# Patient Record
Sex: Female | Born: 1950 | Race: Black or African American | Hispanic: No | Marital: Married | Smoking: Never smoker
Health system: Southern US, Community
[De-identification: ages and names within clinical notes are randomized; demographics above are authoritative.]

## PROBLEM LIST (undated history)

## (undated) DIAGNOSIS — I252 Old myocardial infarction: Secondary | ICD-10-CM

## (undated) DIAGNOSIS — I10 Essential (primary) hypertension: Secondary | ICD-10-CM

## (undated) DIAGNOSIS — R0602 Shortness of breath: Secondary | ICD-10-CM

## (undated) DIAGNOSIS — F32A Depression, unspecified: Secondary | ICD-10-CM

## (undated) DIAGNOSIS — J9601 Acute respiratory failure with hypoxia: Secondary | ICD-10-CM

## (undated) DIAGNOSIS — F419 Anxiety disorder, unspecified: Secondary | ICD-10-CM

## (undated) DIAGNOSIS — E559 Vitamin D deficiency, unspecified: Secondary | ICD-10-CM

## (undated) DIAGNOSIS — I5042 Chronic combined systolic (congestive) and diastolic (congestive) heart failure: Secondary | ICD-10-CM

## (undated) DIAGNOSIS — M25562 Pain in left knee: Secondary | ICD-10-CM

## (undated) DIAGNOSIS — K5900 Constipation, unspecified: Secondary | ICD-10-CM

## (undated) DIAGNOSIS — R600 Localized edema: Secondary | ICD-10-CM

## (undated) DIAGNOSIS — Z90710 Acquired absence of both cervix and uterus: Secondary | ICD-10-CM

## (undated) DIAGNOSIS — F329 Major depressive disorder, single episode, unspecified: Secondary | ICD-10-CM

## (undated) DIAGNOSIS — M549 Dorsalgia, unspecified: Secondary | ICD-10-CM

## (undated) DIAGNOSIS — E785 Hyperlipidemia, unspecified: Secondary | ICD-10-CM

## (undated) DIAGNOSIS — M25561 Pain in right knee: Secondary | ICD-10-CM

## (undated) DIAGNOSIS — M2550 Pain in unspecified joint: Secondary | ICD-10-CM

## (undated) DIAGNOSIS — I428 Other cardiomyopathies: Secondary | ICD-10-CM

## (undated) DIAGNOSIS — I219 Acute myocardial infarction, unspecified: Secondary | ICD-10-CM

## (undated) DIAGNOSIS — M1990 Unspecified osteoarthritis, unspecified site: Secondary | ICD-10-CM

## (undated) DIAGNOSIS — G4730 Sleep apnea, unspecified: Secondary | ICD-10-CM

## (undated) DIAGNOSIS — N189 Chronic kidney disease, unspecified: Secondary | ICD-10-CM

## (undated) DIAGNOSIS — R079 Chest pain, unspecified: Secondary | ICD-10-CM

## (undated) DIAGNOSIS — L03119 Cellulitis of unspecified part of limb: Secondary | ICD-10-CM

## (undated) HISTORY — DX: Unspecified osteoarthritis, unspecified site: M19.90

## (undated) HISTORY — DX: Hyperlipidemia, unspecified: E78.5

## (undated) HISTORY — DX: Essential (primary) hypertension: I10

## (undated) HISTORY — DX: Pain in unspecified joint: M25.50

## (undated) HISTORY — DX: Shortness of breath: R06.02

## (undated) HISTORY — DX: Sleep apnea, unspecified: G47.30

## (undated) HISTORY — DX: Vitamin D deficiency, unspecified: E55.9

## (undated) HISTORY — DX: Chronic kidney disease, unspecified: N18.9

## (undated) HISTORY — DX: Other cardiomyopathies: I42.8

## (undated) HISTORY — DX: Localized edema: R60.0

## (undated) HISTORY — DX: Chronic combined systolic (congestive) and diastolic (congestive) heart failure: I50.42

## (undated) HISTORY — DX: Cellulitis of unspecified part of limb: L03.119

## (undated) HISTORY — DX: Major depressive disorder, single episode, unspecified: F32.9

## (undated) HISTORY — DX: Pain in right knee: M25.562

## (undated) HISTORY — DX: Constipation, unspecified: K59.00

## (undated) HISTORY — DX: Morbid (severe) obesity due to excess calories: E66.01

## (undated) HISTORY — DX: Anxiety disorder, unspecified: F41.9

## (undated) HISTORY — DX: Acute respiratory failure with hypoxia: J96.01

## (undated) HISTORY — DX: Acquired absence of both cervix and uterus: Z90.710

## (undated) HISTORY — DX: Dorsalgia, unspecified: M54.9

## (undated) HISTORY — DX: Depression, unspecified: F32.A

## (undated) HISTORY — DX: Pain in right knee: M25.561

## (undated) HISTORY — DX: Old myocardial infarction: I25.2

---
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
## 2002-07-27 ENCOUNTER — Encounter: Payer: Self-pay | Admitting: Cardiology

## 2002-08-20 HISTORY — PX: OTHER SURGICAL HISTORY: SHX169

## 2009-02-18 ENCOUNTER — Encounter: Payer: Self-pay | Admitting: Cardiology

## 2009-02-18 ENCOUNTER — Ambulatory Visit: Payer: Self-pay | Admitting: Internal Medicine

## 2009-03-22 ENCOUNTER — Telehealth (INDEPENDENT_AMBULATORY_CARE_PROVIDER_SITE_OTHER): Payer: Self-pay | Admitting: *Deleted

## 2009-03-22 ENCOUNTER — Ambulatory Visit: Payer: Self-pay | Admitting: Cardiology

## 2009-03-22 DIAGNOSIS — I5022 Chronic systolic (congestive) heart failure: Secondary | ICD-10-CM

## 2009-03-22 DIAGNOSIS — I10 Essential (primary) hypertension: Secondary | ICD-10-CM

## 2009-03-22 DIAGNOSIS — I509 Heart failure, unspecified: Secondary | ICD-10-CM | POA: Insufficient documentation

## 2009-03-22 DIAGNOSIS — Z6841 Body Mass Index (BMI) 40.0 and over, adult: Secondary | ICD-10-CM | POA: Insufficient documentation

## 2009-03-22 HISTORY — DX: Essential (primary) hypertension: I10

## 2009-03-25 LAB — CONVERTED CEMR LAB
ALT: 26 units/L (ref 0–35)
AST: 25 units/L (ref 0–37)
Albumin ELP: 59.5 % (ref 55.8–66.1)
Albumin: 4.2 g/dL (ref 3.5–5.2)
Alkaline Phosphatase: 87 units/L (ref 39–117)
Alpha-1-Globulin: 4 % (ref 2.9–4.9)
Alpha-2-Globulin: 11.1 % (ref 7.1–11.8)
Anti Nuclear Antibody(ANA): NEGATIVE
BUN: 13 mg/dL (ref 6–23)
Beta Globulin: 6.3 % (ref 4.7–7.2)
Bilirubin, Direct: 0.1 mg/dL (ref 0.0–0.3)
CO2: 31 meq/L (ref 19–32)
Calcium: 8.9 mg/dL (ref 8.4–10.5)
Chloride: 106 meq/L (ref 96–112)
Cholesterol: 218 mg/dL — ABNORMAL HIGH (ref 0–200)
Creatinine, Ser: 0.8 mg/dL (ref 0.4–1.2)
Direct LDL: 159.6 mg/dL
GFR calc non Af Amer: 94.81 mL/min (ref >60)
Gamma Globulin: 13.4 % (ref 11.1–18.8)
Glucose, Bld: 95 mg/dL (ref 70–99)
HDL: 54 mg/dL (ref >39.00)
IgA: 185 mg/dL (ref 68–378)
IgG (Immunoglobin G), Serum: 1170 mg/dL (ref 694–1618)
IgM, Serum: 45 mg/dL — ABNORMAL LOW (ref 60–263)
Potassium: 4 meq/L (ref 3.5–5.1)
Pro B Natriuretic peptide (BNP): 23 pg/mL (ref 0.0–100.0)
Sodium: 144 meq/L (ref 135–145)
Total Bilirubin: 0.7 mg/dL (ref 0.3–1.2)
Total CHOL/HDL Ratio: 4
Total Protein, Serum Electrophoresis: 7.7 g/dL (ref 6.0–8.3)
Total Protein: 7.5 g/dL (ref 6.0–8.3)
Triglycerides: 109 mg/dL (ref 0.0–149.0)
VLDL: 21.8 mg/dL (ref 0.0–40.0)

## 2009-03-30 ENCOUNTER — Telehealth: Payer: Self-pay | Admitting: Cardiology

## 2009-04-04 ENCOUNTER — Ambulatory Visit: Payer: Self-pay | Admitting: Cardiology

## 2009-04-04 ENCOUNTER — Encounter: Payer: Self-pay | Admitting: Cardiology

## 2009-04-04 ENCOUNTER — Ambulatory Visit: Payer: Self-pay

## 2009-04-07 ENCOUNTER — Ambulatory Visit: Payer: Self-pay | Admitting: Pulmonary Disease

## 2009-04-07 DIAGNOSIS — G4733 Obstructive sleep apnea (adult) (pediatric): Secondary | ICD-10-CM | POA: Insufficient documentation

## 2009-04-11 LAB — CONVERTED CEMR LAB
BUN: 22 mg/dL (ref 6–23)
CO2: 34 meq/L — ABNORMAL HIGH (ref 19–32)
Calcium: 9.1 mg/dL (ref 8.4–10.5)
Chloride: 107 meq/L (ref 96–112)
Creatinine, Ser: 0.9 mg/dL (ref 0.4–1.2)
GFR calc non Af Amer: 82.75 mL/min (ref >60)
Glucose, Bld: 127 mg/dL — ABNORMAL HIGH (ref 70–99)
Potassium: 4.1 meq/L (ref 3.5–5.1)
Sodium: 146 meq/L — ABNORMAL HIGH (ref 135–145)

## 2009-04-14 ENCOUNTER — Ambulatory Visit: Payer: Self-pay | Admitting: Internal Medicine

## 2009-04-14 ENCOUNTER — Encounter: Payer: Self-pay | Admitting: Family Medicine

## 2009-04-14 LAB — CONVERTED CEMR LAB
ALT: 25 units/L (ref 0–35)
AST: 22 units/L (ref 0–37)
Albumin: 4.5 g/dL (ref 3.5–5.2)
Alkaline Phosphatase: 81 units/L (ref 39–117)
BUN: 17 mg/dL (ref 6–23)
Basophils Absolute: 0 10*3/uL (ref 0.0–0.1)
Basophils Relative: 0 % (ref 0–1)
CO2: 27 meq/L (ref 19–32)
Calcium: 9 mg/dL (ref 8.4–10.5)
Chloride: 103 meq/L (ref 96–112)
Creatinine, Ser: 0.89 mg/dL (ref 0.40–1.20)
Eosinophils Absolute: 0.1 10*3/uL (ref 0.0–0.7)
Eosinophils Relative: 3 % (ref 0–5)
Glucose, Bld: 110 mg/dL — ABNORMAL HIGH (ref 70–99)
HCT: 40.7 % (ref 36.0–46.0)
Hemoglobin: 12.7 g/dL (ref 12.0–15.0)
Lymphocytes Relative: 31 % (ref 12–46)
Lymphs Abs: 1.2 10*3/uL (ref 0.7–4.0)
MCHC: 31.2 g/dL (ref 30.0–36.0)
MCV: 96.2 fL (ref 78.0–100.0)
Monocytes Absolute: 0.4 10*3/uL (ref 0.1–1.0)
Monocytes Relative: 11 % (ref 3–12)
Neutro Abs: 2.2 10*3/uL (ref 1.7–7.7)
Neutrophils Relative %: 55 % (ref 43–77)
Platelets: 224 10*3/uL (ref 150–400)
Potassium: 4.3 meq/L (ref 3.5–5.3)
Pro B Natriuretic peptide (BNP): 32.4 pg/mL (ref 0.0–100.0)
RBC: 4.23 M/uL (ref 3.87–5.11)
RDW: 12.8 % (ref 11.5–15.5)
Sodium: 142 meq/L (ref 135–145)
TSH: 2.107 microintl units/mL (ref 0.350–4.500)
Total Bilirubin: 0.3 mg/dL (ref 0.3–1.2)
Total Protein: 7.1 g/dL (ref 6.0–8.3)
WBC: 3.9 10*3/uL — ABNORMAL LOW (ref 4.0–10.5)

## 2009-04-21 ENCOUNTER — Ambulatory Visit: Payer: Self-pay | Admitting: Cardiology

## 2009-04-21 DIAGNOSIS — I5032 Chronic diastolic (congestive) heart failure: Secondary | ICD-10-CM

## 2009-05-01 ENCOUNTER — Ambulatory Visit (HOSPITAL_BASED_OUTPATIENT_CLINIC_OR_DEPARTMENT_OTHER): Admission: RE | Admit: 2009-05-01 | Discharge: 2009-05-01 | Payer: Self-pay | Admitting: Pulmonary Disease

## 2009-05-01 ENCOUNTER — Encounter: Payer: Self-pay | Admitting: Pulmonary Disease

## 2009-05-02 ENCOUNTER — Ambulatory Visit: Payer: Self-pay | Admitting: Internal Medicine

## 2009-05-11 ENCOUNTER — Telehealth (INDEPENDENT_AMBULATORY_CARE_PROVIDER_SITE_OTHER): Payer: Self-pay | Admitting: *Deleted

## 2009-05-11 ENCOUNTER — Ambulatory Visit: Payer: Self-pay | Admitting: Pulmonary Disease

## 2009-05-16 ENCOUNTER — Ambulatory Visit: Payer: Self-pay | Admitting: Pulmonary Disease

## 2009-05-31 ENCOUNTER — Ambulatory Visit: Payer: Self-pay | Admitting: Cardiology

## 2009-05-31 DIAGNOSIS — E785 Hyperlipidemia, unspecified: Secondary | ICD-10-CM | POA: Insufficient documentation

## 2009-06-14 ENCOUNTER — Ambulatory Visit: Payer: Self-pay | Admitting: Cardiology

## 2009-06-14 DIAGNOSIS — E7800 Pure hypercholesterolemia, unspecified: Secondary | ICD-10-CM

## 2009-06-14 DIAGNOSIS — I5031 Acute diastolic (congestive) heart failure: Secondary | ICD-10-CM | POA: Insufficient documentation

## 2009-06-15 LAB — CONVERTED CEMR LAB
ALT: 25 units/L (ref 0–35)
AST: 21 units/L (ref 0–37)
Albumin: 4.1 g/dL (ref 3.5–5.2)
Alkaline Phosphatase: 74 units/L (ref 39–117)
BUN: 33 mg/dL — ABNORMAL HIGH (ref 6–23)
Bilirubin, Direct: 0 mg/dL (ref 0.0–0.3)
CO2: 29 meq/L (ref 19–32)
Calcium: 9.1 mg/dL (ref 8.4–10.5)
Chloride: 104 meq/L (ref 96–112)
Cholesterol: 226 mg/dL — ABNORMAL HIGH (ref 0–200)
Creatinine, Ser: 1.3 mg/dL — ABNORMAL HIGH (ref 0.4–1.2)
Direct LDL: 160.6 mg/dL
GFR calc non Af Amer: 54.1 mL/min (ref >60)
Glucose, Bld: 99 mg/dL (ref 70–99)
HDL: 44.6 mg/dL (ref >39.00)
Potassium: 4 meq/L (ref 3.5–5.1)
Sodium: 141 meq/L (ref 135–145)
Total Bilirubin: 0.7 mg/dL (ref 0.3–1.2)
Total CHOL/HDL Ratio: 5
Total Protein: 7.4 g/dL (ref 6.0–8.3)
Triglycerides: 256 mg/dL — ABNORMAL HIGH (ref 0.0–149.0)
VLDL: 51.2 mg/dL — ABNORMAL HIGH (ref 0.0–40.0)

## 2009-06-27 ENCOUNTER — Ambulatory Visit: Payer: Self-pay | Admitting: Pulmonary Disease

## 2009-07-04 ENCOUNTER — Ambulatory Visit: Payer: Self-pay | Admitting: Internal Medicine

## 2009-07-25 ENCOUNTER — Ambulatory Visit: Payer: Self-pay | Admitting: Family Medicine

## 2009-07-25 LAB — CONVERTED CEMR LAB
ALT: 23 units/L (ref 0–35)
AST: 20 units/L (ref 0–37)
Albumin: 4.8 g/dL (ref 3.5–5.2)
Alkaline Phosphatase: 77 units/L (ref 39–117)
BUN: 19 mg/dL (ref 6–23)
CO2: 28 meq/L (ref 19–32)
Calcium: 9.3 mg/dL (ref 8.4–10.5)
Chloride: 102 meq/L (ref 96–112)
Creatinine, Ser: 1.05 mg/dL (ref 0.40–1.20)
Glucose, Bld: 94 mg/dL (ref 70–99)
Potassium: 4 meq/L (ref 3.5–5.3)
Pro B Natriuretic peptide (BNP): 3.3 pg/mL (ref 0.0–100.0)
Sodium: 144 meq/L (ref 135–145)
Total Bilirubin: 0.3 mg/dL (ref 0.3–1.2)
Total Protein: 7.8 g/dL (ref 6.0–8.3)

## 2009-09-20 ENCOUNTER — Ambulatory Visit: Payer: Self-pay | Admitting: Cardiology

## 2009-09-21 LAB — CONVERTED CEMR LAB
ALT: 27 units/L (ref 0–35)
AST: 28 units/L (ref 0–37)
Albumin: 4.4 g/dL (ref 3.5–5.2)
Alkaline Phosphatase: 75 units/L (ref 39–117)
BUN: 22 mg/dL (ref 6–23)
Bilirubin, Direct: 0.1 mg/dL (ref 0.0–0.3)
CO2: 31 meq/L (ref 19–32)
Calcium: 9 mg/dL (ref 8.4–10.5)
Chloride: 103 meq/L (ref 96–112)
Cholesterol: 170 mg/dL (ref 0–200)
Creatinine, Ser: 1.3 mg/dL — ABNORMAL HIGH (ref 0.4–1.2)
GFR calc non Af Amer: 54.05 mL/min (ref >60)
Glucose, Bld: 111 mg/dL — ABNORMAL HIGH (ref 70–99)
HDL: 53.8 mg/dL (ref >39.00)
LDL Cholesterol: 93 mg/dL (ref 0–99)
Magnesium: 2 mg/dL (ref 1.5–2.5)
Potassium: 3.6 meq/L (ref 3.5–5.1)
Sodium: 143 meq/L (ref 135–145)
Total Bilirubin: 0.4 mg/dL (ref 0.3–1.2)
Total CHOL/HDL Ratio: 3
Total Protein: 7.5 g/dL (ref 6.0–8.3)
Triglycerides: 116 mg/dL (ref 0.0–149.0)
VLDL: 23.2 mg/dL (ref 0.0–40.0)

## 2009-10-27 ENCOUNTER — Ambulatory Visit: Payer: Self-pay | Admitting: Cardiology

## 2009-11-03 ENCOUNTER — Encounter: Payer: Self-pay | Admitting: Cardiology

## 2009-11-03 ENCOUNTER — Encounter: Admission: RE | Admit: 2009-11-03 | Discharge: 2009-11-03 | Payer: Self-pay | Admitting: Cardiology

## 2009-11-21 ENCOUNTER — Ambulatory Visit: Payer: Self-pay | Admitting: Internal Medicine

## 2009-11-21 LAB — CONVERTED CEMR LAB
BUN: 26 mg/dL — ABNORMAL HIGH (ref 6–23)
CO2: 25 meq/L (ref 19–32)
CRP: 0.3 mg/dL (ref <0.6)
Calcium: 9.3 mg/dL (ref 8.4–10.5)
Chloride: 106 meq/L (ref 96–112)
Creatinine, Ser: 1.01 mg/dL (ref 0.40–1.20)
Glucose, Bld: 92 mg/dL (ref 70–99)
Hgb A1c MFr Bld: 6.7 % — ABNORMAL HIGH (ref 4.6–6.1)
Potassium: 4.5 meq/L (ref 3.5–5.3)
Pro B Natriuretic peptide (BNP): 18.7 pg/mL (ref 0.0–100.0)
Sodium: 145 meq/L (ref 135–145)
TSH: 1.316 microintl units/mL (ref 0.350–4.500)

## 2009-12-19 ENCOUNTER — Ambulatory Visit: Payer: Self-pay | Admitting: Internal Medicine

## 2010-01-06 ENCOUNTER — Encounter (INDEPENDENT_AMBULATORY_CARE_PROVIDER_SITE_OTHER): Payer: Self-pay | Admitting: *Deleted

## 2010-01-18 ENCOUNTER — Ambulatory Visit: Payer: Self-pay | Admitting: Pulmonary Disease

## 2010-03-08 ENCOUNTER — Ambulatory Visit: Payer: Self-pay | Admitting: Internal Medicine

## 2010-03-08 LAB — CONVERTED CEMR LAB
BUN: 16 mg/dL (ref 6–23)
CO2: 29 meq/L (ref 19–32)
Calcium: 9.3 mg/dL (ref 8.4–10.5)
Chloride: 102 meq/L (ref 96–112)
Creatinine, Ser: 1.01 mg/dL (ref 0.40–1.20)
Glucose, Bld: 123 mg/dL — ABNORMAL HIGH (ref 70–99)
Magnesium: 2.1 mg/dL (ref 1.5–2.5)
Potassium: 4.6 meq/L (ref 3.5–5.3)
Sodium: 145 meq/L (ref 135–145)

## 2010-05-25 ENCOUNTER — Encounter (INDEPENDENT_AMBULATORY_CARE_PROVIDER_SITE_OTHER): Payer: Self-pay | Admitting: Emergency Medicine

## 2010-05-25 ENCOUNTER — Ambulatory Visit: Payer: Self-pay | Admitting: Surgery

## 2010-05-26 ENCOUNTER — Inpatient Hospital Stay (HOSPITAL_COMMUNITY): Admission: EM | Admit: 2010-05-26 | Discharge: 2010-05-29 | Payer: Self-pay | Admitting: Emergency Medicine

## 2010-08-23 ENCOUNTER — Encounter: Payer: Self-pay | Admitting: Cardiology

## 2010-08-23 ENCOUNTER — Ambulatory Visit
Admission: RE | Admit: 2010-08-23 | Discharge: 2010-08-23 | Payer: Self-pay | Source: Home / Self Care | Attending: Cardiology | Admitting: Cardiology

## 2010-09-01 ENCOUNTER — Ambulatory Visit: Admit: 2010-09-01 | Payer: Self-pay | Admitting: Cardiology

## 2010-09-19 NOTE — Letter (Signed)
Summary: Appointment - Reminder [place], [place]  [address] [place]   [place], [place] [address]   Phone: [phone]  Fax: [phone]     Jan 06, 2010 MRN: [id number]   Robin Hardin [address] [place], [place]  [address]   Dear Ms. [name],  [facility] records indicate that it is time to schedule a follow-up appointment with Dr. Aundra Dubin. It is very important that we reach you to schedule this appointment. We look forward to participating in your health care needs. Please contact us at the number listed above at your earliest convenience to schedule your appointment.  If you are unable to make an appointment at this time, give [place] a call so we can update our records.     Sincerely,  Darnell Level [facility] Scheduling Team

## 2010-09-19 NOTE — Assessment & Plan Note (Signed)
Summary: per chekc out/sf   Referring Provider:  Loralie Champagne Primary Provider:  Dr. Milinda Antis  CC:  follow up of labs.  Pt reports she is having cramps in her thighs and feet.   She was put on potassium and magnesium.  Robin Hardin  History of Present Illness: 60 yo with history of CHF (nonischemic CMP), morbid obesity, and hypertension presents for followup.  She had been followed by a cardiologist in [place] until she moved with her family to [place].  She was diagnosed with CHF in 2003 and has been hospitalized 3 times.  LHC showed no significant coronary disease but EF 30-35% on V-gram.  Our followup echo showed EF 45-50%.    She is not very active. She is short of breath if she walks fast but can walk for 3-4 minutes at a time if she goes slowly.  She can climb a flight of steps but it makes her short of breath.  She has 3 pillow orthopnea.  These symptoms have been chronic.  She is very limited by bilateral knee pain and back pain. She was having severe leg cramps in the setting of torsemide use at last appointment.  She is now on potassium and magnesium replacement with improvement in her cramping.  She is not taking her torsemide regularly and weight is up 9 lbs since last appointment.  She is using CPAP.    Labs (8/10): K 4.1, creatinine 0.9, SPEP neg, ANA neg, BNP 32.4, TSH normal, HIV negative, LDL 160, HDL 54 Labs (10/10): TGs 256, HDL 45, LDL 160 Labs (12/10): creatinine 1.05, BNP 3 Labs (2/11): LDL 93, HDL 54, Mg 2, K 3.6, creatinine 1.3, LFTs normal  Current Medications (verified): 1)  Aldactone 25 Mg Tabs (Spironolactone) .... Once Daily 2)  Allopurinol 100 Mg Tabs (Allopurinol) .... Take One Tablet By Mouth Once Daily 3)  Celexa 40 Mg Tabs (Citalopram Hydrobromide) .... Once Daily 4)  Carvedilol 12.5 Mg Tabs (Carvedilol) .... Two Times A Day 5)  Meloxicam 15 Mg Tabs (Meloxicam) .... Once Daily 6)  Torsemide 20 Mg Tabs (Torsemide) .... Two Tablets Daily 7)  Cyclobenzaprine Hcl 10 Mg  Tabs (Cyclobenzaprine Hcl) .... Three Times A Day As Needed 8)  Amitriptyline Hcl 50 Mg Tabs (Amitriptyline Hcl) .... Once Daily 9)  Aspirin 325 Mg Tabs (Aspirin) .... Once Daily 10)  Pravastatin Sodium 40 Mg Tabs (Pravastatin Sodium) .... 2 Tabs in The Evening 11)  Diovan 80 Mg Tabs (Valsartan) .... 2 Tabs Am-  1 Tab Pm 12)  Potassium Chloride Crys Cr 20 Meq Cr-Tabs (Potassium Chloride Crys Cr) .... Take One Tablet By Mouth Twice A Day 13)  Magnesium Oxide 400 Mg Tabs (Magnesium Oxide) .... One Tablet Daily  Allergies (verified): No Known Drug Allergies  Past History:  Past Medical History: Reviewed history from 09/20/2009 and no changes required. 1.  Nonischemic CMP:  Patient was diagnosed with CHF in 2003.  She was hospitalized at [facility] in [place] where she had a cath showing normal coronaries but EF 30-35% on LV-gram.  She has been hospitalized two other times since then in [place] for CHF.  She has recently moved to [facility].  Echo (8/10) showed EF 45-50%, mild global HK, mild LVH, grade I diastolic dysfunction, no significant valvular problems.   2.  HTN 3.  Depression 4.  Morbid obesity 5.  Hyperlipidemia 6.  OSA: using CPAP 7.  Gout on allopurinol 8.  Bilateral knee pain, probable osteoarthritis  Family History: Reviewed history  from 04/21/2009 and no changes required. No FH of premature CAD asthma: mother, 2 brothers heart disease: mother clotting disorders: mother (stroke)   Social History: Reviewed history from 04/07/2009 and no changes required. Recently moved from [place] to [place].   Married with 2 adopted children.    Never smoked, no ETOH or drugs.   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   Review of Systems       All systems reviewed and negative except as per HPI.   Vital Signs:  Patient profile:   60 year old female Height:      63 inches Weight:      392 pounds BMI:     69.69 Pulse rate:   85 / minute Pulse rhythm:    regular BP sitting:   112 / 79  (left arm) Cuff size:   large  Vitals Entered By: Doug Sou CMA (October 27, 2009 11:27 AM)  Physical Exam  General:  morbidly obese female in nad Neck:  Neck supple, no JVD. No masses, thyromegaly or abnormal cervical nodes. Lungs:  Clear bilaterally to auscultation and percussion. Heart:  Non-displaced PMI, chest non-tender; regular rate and rhythm, S1, S2 without rubs or gallops, 1/6 SEM RUSB. Carotid upstroke normal, no bruit. Pedals normal pulses. 1+ ankle edema.  Abdomen:  Bowel sounds positive; abdomen soft and non-tender without masses, organomegaly, or hernias noted. No hepatosplenomegaly.  Obese.  Extremities:  No clubbing or cyanosis. Neurologic:  Alert and oriented x 3. Psych:  Normal affect.   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  Symptoms are stable.   She is probably mildly volume overloaded.  She is very limited by obesity and arthritis in addition to her CHF/volume overload.  Cramping has improved with use of KCl and magnesium but she is still not very regular about taking torsemide and weight is up.  I emphasized to her the need to take torsemide daily.  She is trying to avoid sodium.  Continue Coreg, valsartan, and spironolactone.    Problem # 2:  BXUXYBFXOVANVB-TYOMA (YOK-599.4) Lipids improved on higher dose of pravastatin.   Problem # 3:  OBESITY-MORBID (>100') (ICD-278.01) I discussed the need for dieting and exercise with the patient.  She has an appointment to see a nutritionist at [facility].    Patient Instructions: 1)  Your physician recommends that you schedule a follow-up appointment in: 4 months with Dr. Aundra Dubin.

## 2010-09-19 NOTE — Assessment & Plan Note (Signed)
Summary: 3 MONTH ROV/SL   Visit Type:  Follow-up Referring Provider:  Loralie Champagne Primary Provider:  Dr. Milinda Antis  CC:  Pt was not able to take torsemide 3 pills daily due to leg cramps . Pt states some days she took 1 or 2 then do to cramping she may not have taking any..  History of Present Illness: 60 yo with history of CHF (nonischemic CMP), morbid obesity, and hypertension presents for followup.  She had been followed by a cardiologist in [place] until she moved with her family to [place].  She was diagnosed with CHF in 2003 and has been hospitalized 3 times.  LHC showed no significant coronary disease but EF 30-35% on V-gram.  Our followup echo showed EF 45-50%.    She is not very active. She is short of breath if she walks fast but can walk for 3-4 minutes at a time if she goes slowly.  She can climb a flight of steps but it makes her short of breath.  She has 3 pillow orthopnea.  These symptoms have been chronic.  She is very limited by bilateral knee pain and back pain. I put her on torsemide 60 mg daily.  She tells me that she has been unable to take the full dose because of severe cramping in her legs bilaterally.  She has been taking 20 mg or 40 mg daily but did not take any for the last 2 days.  Potassium level was normal when checked in 12/10.  When she does not take torsemide, she does not get leg cramps.  She has been using her CPAP.   Weight is down 3 lbs since last appointment.   Labs (8/10): K 4.1, creatinine 0.9, SPEP neg, ANA neg, BNP 32.4, TSH normal, HIV negative, LDL 160, HDL 54 Labs (10/10): TGs 256, HDL 45, LDL 160 Labs (12/10): creatinine 1.05, BNP 3  Current Medications (verified): 1)  Aldactone 25 Mg Tabs (Spironolactone) .... Once Daily 2)  Allopurinol 100 Mg Tabs (Allopurinol) .... Take One Tablet By Mouth Once Daily 3)  Celexa 40 Mg Tabs (Citalopram Hydrobromide) .... Once Daily 4)  Carvedilol 12.5 Mg Tabs (Carvedilol) .... Two Times A Day 5)  Meloxicam 15  Mg Tabs (Meloxicam) .... Once Daily 6)  Torsemide 20 Mg Tabs (Torsemide) .... Two Tablets Daily 7)  Cyclobenzaprine Hcl 10 Mg Tabs (Cyclobenzaprine Hcl) .... Three Times A Day As Needed 8)  Amitriptyline Hcl 50 Mg Tabs (Amitriptyline Hcl) .... Once Daily 9)  Aspirin 325 Mg Tabs (Aspirin) .... Once Daily 10)  Cvs Vitamin E 1000 Unit Caps (Vitamin E) .... Once Daily(Hold) 11)  Fe-Caps 250 Mg Cr-Caps (Ferrous Sulfate) .... Once Daily(Hold) 12)  Fish Oil 1200 Mg Caps (Omega-3 Fatty Acids) .... Once Daily (Hold) 13)  Pravastatin Sodium 40 Mg Tabs (Pravastatin Sodium) .... 2 Tabs in The Evening 14)  Diovan 80 Mg Tabs (Valsartan) .... 2 Tabs Am-  1 Tab Pm 15)  Potassium Chloride Crys Cr 20 Meq Cr-Tabs (Potassium Chloride Crys Cr) .... Take One Tablet By Mouth Twice A Day 16)  Magnesium Oxide 400 Mg Tabs (Magnesium Oxide) .... One Tablet Daily  Allergies (verified): No Known Drug Allergies  Past History:  Past Medical History: 1.  Nonischemic CMP:  Patient was diagnosed with CHF in 2003.  She was hospitalized at [facility] in [place] where she had a cath showing normal coronaries but EF 30-35% on LV-gram.  She has been hospitalized two other times since then in [place]  for CHF.  She has recently moved to [facility]  Echo (8/10) showed EF 45-50%, mild global HK, mild LVH, grade I diastolic dysfunction, no significant valvular problems.   2.  HTN 3.  Depression 4.  Morbid obesity 5.  Hyperlipidemia 6.  OSA: using CPAP 7.  Gout on allopurinol 8.  Bilateral knee pain, probable osteoarthritis  Family History: Reviewed history from 04/21/2009 and no changes required. No FH of premature CAD asthma: mother, 2 brothers heart disease: mother clotting disorders: mother (stroke)   Social History: Reviewed history from 04/07/2009 and no changes required. Recently moved from [place] to [place].   Married with 2 adopted children.    Never smoked, no ETOH or drugs.   retired/on disability.  previously worked as a Copywriter, advertising.   Review of Systems       All systems reviewed and negative except as per HPI.   Vital Signs:  Patient profile:   60 year old female Height:      63 inches Weight:      383 pounds Pulse rate:   67 / minute BP sitting:   122 / 76  (left arm) Cuff size:   large  Vitals Entered By: Lubertha Basque, CNA (September 20, 2009 10:11 AM)  Physical Exam  General:  morbidly obese female in nad Neck:  Neck supple, JVP difficult to discern given body habitus, probably 8-9 cm. No masses, thyromegaly or abnormal cervical nodes. Lungs:  Clear bilaterally to auscultation and percussion. Heart:  Non-displaced PMI, chest non-tender; regular rate and rhythm, S1, S2 without rubs or gallops, 1/6 SEM RUSB. Carotid upstroke normal, no bruit. Pedals normal pulses. 1+ edema 1/2 up lower legs bilaterally. Abdomen:  Bowel sounds positive; abdomen soft and non-tender without masses, organomegaly, or hernias noted. No hepatosplenomegaly.  Obese.  Extremities:  No clubbing or cyanosis. Neurologic:  Alert and oriented x 3. Psych:  Normal affect.   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  She appears a bit volume overloaded today.  Symptoms are stable.  She is very limited by obesity and arthritis in addition to her CHF/volume overload.  She has not been taking torsemide because of cramping.  The cramping resolves off torsemide so I suspect that electrolyte shifts rather than her statin are causing the symptoms.  I will have her restart torsemide at 40 mg daily.  She will also take KCl 40 mEq daily and magnesium oxide 400 mg daily.  I will check Mg and K today.  Of note, her BNP runs low but is likely a false positive due to obesity. Repeat BMET and Mg in 2 wks.  Continue same doses of Coreg and valsartan.  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   Problem # 3:  OBSTRUCTIVE  SLEEP APNEA (ICD-327.23) Continue to use CPAP.   Problem # 4:  OBESITY-MORBID (>100') (ICD-278.01) Really needs to work on weight loss. Given joint pains, hard for her to exercise.  Will refer her to nutrition.   Other Orders: TLB-BMP (Basic Metabolic Panel-BMET) (95188-[id number]) TLB-Lipid Panel (80061-LIPID) TLB-Hepatic/Liver Function Pnl (80076-HEPATIC) TLB-Magnesium (Mg) (83735-MG) Nutrition Referral (Nutrition)  Patient Instructions: 1)  Your physician has recommended you make the following change in your medication:  2)  Take Torsemide 69m two tablets daily 3)  Start KCL(potassium) 263m-one tablet twice a day 4)  Start Magnesium Oxide 40081mne tablet daily 5)  Your physician recommends that you have a  lipid profile/liver profile/BMP/Magnesium today 428.22 6)  Your physician recommends that you return for lab work in: 2 weeks BMP/Magnesium 428.22 7)  Your physician recommends that you schedule a follow-up appointment in: 1 month with Dr. Loralie Champagne  8)  An appointment for you to see the Dietician has been scheduled. Please keep your appointment or be sure to call if you need to reschedule. Prescriptions: MAGNESIUM OXIDE 400 MG TABS (MAGNESIUM OXIDE) one tablet daily  #30 x 3   Entered by:   Desiree Lucy, RN, BSN   Authorized by:   Loralie Champagne, MD   Signed by:   Desiree Lucy, RN, BSN on 09/20/2009   Method used:   Electronically to        [facility] [phone]* (retail)       [place], [place]  [address]       Ph: [phone]       Fax: [phone]   RxID:   [id number] POTASSIUM CHLORIDE CRYS CR 20 MEQ CR-TABS (POTASSIUM CHLORIDE CRYS CR) Take one tablet by mouth twice a day  #60 x 3   Entered by:   Desiree Lucy, RN, BSN   Authorized by:   Loralie Champagne, MD   Signed by:   Desiree Lucy, RN, BSN on 09/20/2009   Method used:   Electronically to        [facility] [phone]* (retail)       [place], [place]   [address]       Ph: [phone]       Fax: [phone]   RxID:   [id number] TORSEMIDE 20 MG TABS (TORSEMIDE) two tablets daily  #60 x 3   Entered by:   Desiree Lucy, RN, BSN   Authorized by:   Loralie Champagne, MD   Signed by:   Desiree Lucy, RN, BSN on 09/20/2009   Method used:   Electronically to        [facility] [phone]* (retail)       [address]       [place], [place]  [address]       Ph: [phone]       Fax: [phone]   RxID:   [id number]

## 2010-09-19 NOTE — Letter (Signed)
Summary: [facility] [place]   Imported By: Sallee Provencal 11/18/2009 11:07:51  _____________________________________________________________________  External Attachment:    Type:   Image     Comment:   External Document

## 2010-09-19 NOTE — Assessment & Plan Note (Signed)
Summary: rov for osa   Copy to:  Loralie Champagne Primary Provider/Referring Provider:  Dr. Milinda Antis  CC:  ROV and No new complaints.  History of Present Illness: the pt comes in today for f/u of her osa.  She is wearing cpap compliantly, but has not kept up with mask changes.  She denies any issues with mask fit or pressure, and feels that she is sleeping well.  She is also satisfied with her daytime alertness.  Unfortunately, she has not lost weight, and has actually gained 10 pounds since the last visit.  Current Medications (verified): 1)  Aldactone 25 Mg Tabs (Spironolactone) .... Once Daily 2)  Allopurinol 100 Mg Tabs (Allopurinol) .... Take One Tablet By Mouth Once Daily 3)  Celexa 40 Mg Tabs (Citalopram Hydrobromide) .... Once Daily 4)  Carvedilol 12.5 Mg Tabs (Carvedilol) .... Two Times A Day 5)  Meloxicam 15 Mg Tabs (Meloxicam) .... Once Daily 6)  Torsemide 20 Mg Tabs (Torsemide) .... Two Tablets Daily 7)  Cyclobenzaprine Hcl 10 Mg Tabs (Cyclobenzaprine Hcl) .... Three Times A Day As Needed 8)  Amitriptyline Hcl 50 Mg Tabs (Amitriptyline Hcl) .... Once Daily 9)  Aspirin 325 Mg Tabs (Aspirin) .... Once Daily 10)  Pravastatin Sodium 40 Mg Tabs (Pravastatin Sodium) .... 2 Tabs in The Evening 11)  Diovan 80 Mg Tabs (Valsartan) .... 2 Tabs Am-  1 Tab Pm 12)  Potassium Chloride Crys Cr 20 Meq Cr-Tabs (Potassium Chloride Crys Cr) .... Take One Tablet By Mouth Twice A Day 13)  Magnesium Oxide 400 Mg Tabs (Magnesium Oxide) .... One Tablet Daily 14)  Cinnamon 500 Mg Caps (Cinnamon) .... Take 2 Cap 1 Hour B4 Meal 15)  Metformin Hcl 500 Mg Tabs (Metformin Hcl) .Marland Kitchen.. 1 Tablet Twice Daily 16)  Avocado Tabs .... Take 1 Tablet By Mouth Two Times A Day  Allergies (verified): No Known Drug Allergies  Review of Systems       The patient complains of shortness of breath with activity and joint stiffness or pain.  The patient denies shortness of breath at rest, productive cough, non-productive  cough, coughing up blood, chest pain, irregular heartbeats, acid heartburn, indigestion, loss of appetite, weight change, abdominal pain, difficulty swallowing, sore throat, tooth/dental problems, headaches, nasal congestion/difficulty breathing through nose, sneezing, itching, ear ache, anxiety, depression, hand/feet swelling, rash, change in color of mucus, and fever.    Vital Signs:  Patient profile:   60 year old female Height:      63 inches Weight:      392 pounds BMI:     69.69 O2 Sat:      97 % on Room air Temp:     98.1 degrees F oral Pulse rate:   76 / minute BP sitting:   124 / 74  (right arm)  Vitals Entered By: Lyndee Leo, CMA (January 18, 2010 2:14 PM)  O2 Sat at Rest %:  97% O2 Flow:  Room air  Reason for Visit patient states she is feeling well, her only complaint is dyspnea w/exertion, states she must rest after walking steps   Physical Exam  General:  morbidly obese female in nad Nose:  no skin breakdown or pressure necrosis from cpap mask no purulence, nasal airway patent. Mouth:  clear Extremities:  +edema, no cyanosis Neurologic:  alert and oriented, moves all 4.   Impression & Recommendations:  Problem # 1:  OBSTRUCTIVE SLEEP APNEA (ICD-327.23) the pt has been compliant with cpap, but is due for a  new mask and supplies.  Will send the order to her dme.  She is sleeping well, and satisfied with her alertness during the day.  She is gaining weight, and I have reminded her that she is approaching the limit of her cpap machine wrt pressure level.  If she continues to gain weight, she may require trach with nocturnal ventilator in order to appropriately treat her.  Medications Added to Medication List This Visit: 1)  Cinnamon 500 Mg Caps (Cinnamon) .... Take 2 cap 1 hour b4 meal 2)  Metformin Hcl 500 Mg Tabs (Metformin hcl) .Marland Kitchen.. 1 tablet twice daily 3)  Avocado Tabs  .... Take 1 tablet by mouth two times a day  Other Orders: Est. Patient Level III  (44360)  Patient Instructions: 1)  continue with cpap nightly.   2)  work on weight loss 3)  make sure you get a new mask every 6-69mo. 4)  followup with me in one year.

## 2010-09-21 NOTE — Assessment & Plan Note (Signed)
Summary: sob/mt   Visit Type:  rov Primary Provider:  Dr. Mammie Lorenzo  CC:  shortness of breath, swelling in legs, and and hands.  History of Present Illness: 60 yo with history of CHF (nonischemic CMP), morbid obesity, and hypertension presents for followup.  She had been followed by a cardiologist in [place] until she moved with her family to [place].  She was diagnosed with CHF in 2003 and has been hospitalized 3 times.  LHC showed no significant coronary disease but EF 30-35% on V-gram.  Our followup echo showed EF 45-50%.    She is not very active. She is short of breath if she walks fast but can walk for 3-4 minutes at a time if she goes slowly.  She can climb a flight of steps but it makes her very short of breath. She sleeps on 2 pillows but is not propped up much. These symptoms have been chronic.  She is very limited by bilateral knee pain and back pain. Weight is down 1 lb since last appointment.  A few weeks ago, she was more short of breath (was not taking her torsemide every day).  Since restarting daily torsemide, breathing has returned to baseline.    Labs (8/10): K 4.1, creatinine 0.9, SPEP neg, ANA neg, BNP 32.4, TSH normal, HIV negative, LDL 160, HDL 54 Labs (10/10): TGs 256, HDL 45, LDL 160 Labs (12/10): creatinine 1.05, BNP 3 Labs (2/11): LDL 93, HDL 54, Mg 2, K 3.6, creatinine 1.3, LFTs normal Labs (4/11): K 4.5, creatinine 1.01, TSH normal, BNP 19  Current Medications (verified): 1)  Aldactone 25 Mg Tabs (Spironolactone) .... Once Daily 2)  Allopurinol 100 Mg Tabs (Allopurinol) .... Take One Tablet By Mouth Once Daily 3)  Celexa 40 Mg Tabs (Citalopram Hydrobromide) .... Once Daily 4)  Carvedilol 12.5 Mg Tabs (Carvedilol) .... Two Times A Day 5)  Arthrotec 50-200 Mg-Mcg Tabs (Diclofenac-Misoprostol) .Marland Kitchen.. 1 Tablet Two Times A Day As Needed' 6)  Torsemide 20 Mg Tabs (Torsemide) .... Three  Tablets Daily 7)  Cyclobenzaprine Hcl 10 Mg Tabs (Cyclobenzaprine Hcl) ....  Three Times A Day As Needed 8)  Amitriptyline Hcl 50 Mg Tabs (Amitriptyline Hcl) .... Once Daily 9)  Aspirin 325 Mg Tabs (Aspirin) .... Once Daily 10)  Pravastatin Sodium 40 Mg Tabs (Pravastatin Sodium) .... 2 Tabs in The Evening 11)  Diovan 80 Mg Tabs (Valsartan) .... 2 Tabs Twice A Day 12)  Potassium Chloride Crys Cr 20 Meq Cr-Tabs (Potassium Chloride Crys Cr) .... Take One Tablet By Mouth Twice A Day 13)  Magnesium Oxide 400 Mg Tabs (Magnesium Oxide) .... One Tablet Daily 14)  Cinnamon 500 Mg Caps (Cinnamon) .... Take 2 Cap 1 Hour B4 Meal 15)  Metformin Hcl 500 Mg Tabs (Metformin Hcl) .Marland Kitchen.. 1 Tablet Twice Daily 16)  Avocado Tabs .... Take 1 Tablet By Mouth Two Times A Day 17)  Paxil 20 Mg Tabs (Paroxetine Hcl) .... Once Daily 18)  Tramadol Hcl 50 Mg Tabs (Tramadol Hcl) .... Three Times A Day As Needed 19)  Ferretts 325 (106 Fe) Mg Tabs (Ferrous Fumarate) .... Once Daily 20)  Fish Oil 1000 Mg Caps (Omega-3 Fatty Acids) .Marland Kitchen.. 1 Tablet Two Times A Day 21)  Vitamin E 400 Unit Caps (Vitamin E) .... Once Daily  Allergies (verified): No Known Drug Allergies  Past History:  Past Medical History: 1.  Nonischemic CMP:  Patient was diagnosed with CHF in 2003.  She was hospitalized at [facility] in [place] where  she had a cath showing normal coronaries but EF 30-35% on LV-gram.  She has been hospitalized two other times since then in [place] for CHF.  She has recently moved to [facility].  Echo (8/10) showed EF 45-50%, mild global HK, mild LVH, grade I diastolic dysfunction, no significant valvular problems.   2.  HTN 3.  Depression 4.  Morbid obesity 5.  Hyperlipidemia 6.  OSA: using CPAP 7.  Gout on allopurinol 8.  Bilateral knee pain, probable osteoarthritis 9.  Diabetes type II 10.  Left lower leg cellulitis in 10/11 with normal [place] for DVT.   Family History: Reviewed history from 04/21/2009 and no changes required. No FH of premature CAD asthma: mother, 2 brothers heart disease:  mother clotting disorders: mother (stroke)   Social History: Reviewed history from 04/07/2009 and no changes required. Recently moved from [place] to [place].   Married with 2 adopted children.    Never smoked, no ETOH or drugs.   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   Review of Systems       All systems reviewed and negative except as per HPI.   Vital Signs:  Patient profile:   60 year old female Height:      63 inches Weight:      391 pounds BMI:     69.51 Pulse rate:   81 / minute BP sitting:   124 / 68  (left arm) Cuff size:   regular  Vitals Entered By: Hansel Feinstein CMA (August 23, 2010 3:28 PM)  Physical Exam  General:  morbidly obese female in nad Neck:  Neck supple, no JVD. No masses, thyromegaly or abnormal cervical nodes. Lungs:  Clear bilaterally to auscultation and percussion. Heart:  Non-displaced PMI, chest non-tender; regular rate and rhythm, S1, S2 without rubs or gallops, 1/6 SEM RUSB. Carotid upstroke normal, no bruit. Pedals normal pulses. 1+ edema left lower leg, trace edema right ankle.   Abdomen:  Bowel sounds positive; abdomen soft and non-tender without masses, organomegaly, or hernias noted. No hepatosplenomegaly. Extremities:  No clubbing or cyanosis. Neurologic:  Alert and oriented x 3. Psych:  Normal affect.   Impression & Recommendations:  Problem # 1:  CONGESTIVE HEART FAILURE, UNSPECIFIED (ICD-428.0) Nonischemic cardiomyopathy, last EF 45-50%.  Patient has long-standing CHF.  She does not appear severely volume overloaded today but difficult to tell given body habitus.  She has NYHA class III symptoms but I think that obesity and deconditioning also play a large role in her symptomatology.  Today, I will have her continue spironolactone and Coreg and will increase valsartan to 160 mg two times a day. BMET in 1 week.   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   OK to decrease aspirin  to 81 mg daily.   Patient Instructions: 1)  Your physician has recommended you make the following change in your medication:  2)  Increase Diovan(valsartan) to 15m twice a day--this will be two 865mtablets twice a day. 3)  Your physician recommends that you return for a FASTING lipid profile/liver profile /BMP in 1 week --428.22 4)  Your physician wants you to follow-up in: 6 months with Dr McAundra Dubin You will receive a reminder letter in the mail two months in advance. If you don't receive a letter, please call our office to schedule the follow-up appointment. Prescriptions: DIOVAN 80 MG TABS (VALSARTAN) 2 tabs twice a day  #120 x 6   Entered by:   AnWebb Silversmith  Lankford, RN, BSN   Authorized by:   Loralie Champagne, MD   Signed by:   Desiree Lucy, RN, BSN on 08/23/2010   Method used:   Faxed to ...       [facility] (retail)       [place], [place]  [address]       Ph: [phone]       Fax: [phone]   RxID:   [id number] DIOVAN 80 MG TABS (VALSARTAN) 2 tabs twice a day  #120 x 6   Entered by:   Desiree Lucy, RN, BSN   Authorized by:   Loralie Champagne, MD   Signed by:   Desiree Lucy, RN, BSN on 08/23/2010   Method used:   Print then Give to Patient   RxID:   [id number] DIOVAN 80 MG TABS (VALSARTAN) 2 tabs twice a day  #120 x 6   Entered by:   Desiree Lucy, RN, BSN   Authorized by:   Loralie Champagne, MD   Signed by:   Desiree Lucy, RN, BSN on 08/23/2010   Method used:   Print then Give to Patient   RxID:   [id number]

## 2010-11-02 LAB — CBC
HCT: 33.2 % — ABNORMAL LOW (ref 36.0–46.0)
HCT: 34 % — ABNORMAL LOW (ref 36.0–46.0)
HCT: 35.5 % — ABNORMAL LOW (ref 36.0–46.0)
Hemoglobin: 10.5 g/dL — ABNORMAL LOW (ref 12.0–15.0)
Hemoglobin: 10.6 g/dL — ABNORMAL LOW (ref 12.0–15.0)
MCH: 29.1 pg (ref 26.0–34.0)
MCH: 29.8 pg (ref 26.0–34.0)
MCHC: 31.2 g/dL (ref 30.0–36.0)
MCHC: 31.6 g/dL (ref 30.0–36.0)
MCV: 91.3 fL (ref 78.0–100.0)
MCV: 93.4 fL (ref 78.0–100.0)
MCV: 94.3 fL (ref 78.0–100.0)
Platelets: 224 10*3/uL (ref 150–400)
Platelets: 244 10*3/uL (ref 150–400)
Platelets: 279 10*3/uL (ref 150–400)
RBC: 3.52 MIL/uL — ABNORMAL LOW (ref 3.87–5.11)
RBC: 3.63 MIL/uL — ABNORMAL LOW (ref 3.87–5.11)
RBC: 3.64 MIL/uL — ABNORMAL LOW (ref 3.87–5.11)
RBC: 3.89 MIL/uL (ref 3.87–5.11)
RDW: 13.4 % (ref 11.5–15.5)
RDW: 13.9 % (ref 11.5–15.5)
WBC: 7.3 10*3/uL (ref 4.0–10.5)
WBC: 8.3 10*3/uL (ref 4.0–10.5)
WBC: 8.5 10*3/uL (ref 4.0–10.5)
WBC: 8.8 10*3/uL (ref 4.0–10.5)

## 2010-11-02 LAB — BASIC METABOLIC PANEL
BUN: 12 mg/dL (ref 6–23)
BUN: 12 mg/dL (ref 6–23)
CO2: 27 mEq/L (ref 19–32)
CO2: 27 mEq/L (ref 19–32)
Calcium: 8.4 mg/dL (ref 8.4–10.5)
Calcium: 8.5 mg/dL (ref 8.4–10.5)
Calcium: 8.7 mg/dL (ref 8.4–10.5)
Chloride: 103 mEq/L (ref 96–112)
Chloride: 107 mEq/L (ref 96–112)
Chloride: 109 mEq/L (ref 96–112)
Chloride: 98 mEq/L (ref 96–112)
Creatinine, Ser: 0.96 mg/dL (ref 0.4–1.2)
Creatinine, Ser: 1.08 mg/dL (ref 0.4–1.2)
Creatinine, Ser: 1.44 mg/dL — ABNORMAL HIGH (ref 0.4–1.2)
GFR calc Af Amer: 45 mL/min — ABNORMAL LOW (ref >60)
GFR calc Af Amer: >60 mL/min (ref >60)
GFR calc Af Amer: >60 mL/min (ref >60)
GFR calc Af Amer: >60 mL/min (ref >60)
GFR calc non Af Amer: 37 mL/min — ABNORMAL LOW (ref >60)
GFR calc non Af Amer: 52 mL/min — ABNORMAL LOW (ref >60)
GFR calc non Af Amer: 56 mL/min — ABNORMAL LOW (ref >60)
GFR calc non Af Amer: 60 mL/min — ABNORMAL LOW (ref >60)
Glucose, Bld: 123 mg/dL — ABNORMAL HIGH (ref 70–99)
Glucose, Bld: 126 mg/dL — ABNORMAL HIGH (ref 70–99)
Potassium: 3.7 mEq/L (ref 3.5–5.1)
Potassium: 4.2 mEq/L (ref 3.5–5.1)
Potassium: 4.6 mEq/L (ref 3.5–5.1)
Sodium: 142 mEq/L (ref 135–145)
Sodium: 143 mEq/L (ref 135–145)
Sodium: 143 mEq/L (ref 135–145)

## 2010-11-02 LAB — DIFFERENTIAL
Basophils Absolute: 0 10*3/uL (ref 0.0–0.1)
Basophils Relative: 0 % (ref 0–1)
Eosinophils Absolute: 0.2 10*3/uL (ref 0.0–0.7)
Eosinophils Relative: 3 % (ref 0–5)
Lymphocytes Relative: 15 % (ref 12–46)
Lymphs Abs: 1.3 10*3/uL (ref 0.7–4.0)
Monocytes Absolute: 0.7 10*3/uL (ref 0.1–1.0)
Monocytes Relative: 8 % (ref 3–12)
Neutro Abs: 6.6 10*3/uL (ref 1.7–7.7)
Neutrophils Relative %: 75 % (ref 43–77)

## 2010-11-02 LAB — URINALYSIS, ROUTINE W REFLEX MICROSCOPIC
Bilirubin Urine: NEGATIVE
Glucose, UA: NEGATIVE mg/dL
Hgb urine dipstick: NEGATIVE
Ketones, ur: NEGATIVE mg/dL
Nitrite: NEGATIVE
Protein, ur: NEGATIVE mg/dL
Specific Gravity, Urine: 1.022 (ref 1.005–1.030)
Urobilinogen, UA: 1 mg/dL (ref 0.0–1.0)
pH: 6 (ref 5.0–8.0)

## 2010-11-02 LAB — COMPREHENSIVE METABOLIC PANEL
ALT: 35 U/L (ref 0–35)
AST: 26 U/L (ref 0–37)
Albumin: 3.3 g/dL — ABNORMAL LOW (ref 3.5–5.2)
Alkaline Phosphatase: 103 U/L (ref 39–117)
BUN: 11 mg/dL (ref 6–23)
CO2: 27 mEq/L (ref 19–32)
Calcium: 8.5 mg/dL (ref 8.4–10.5)
Chloride: 107 mEq/L (ref 96–112)
Creatinine, Ser: 0.94 mg/dL (ref 0.4–1.2)
GFR calc Af Amer: >60 mL/min (ref >60)
GFR calc non Af Amer: >60 mL/min (ref >60)
Glucose, Bld: 119 mg/dL — ABNORMAL HIGH (ref 70–99)
Potassium: 4 mEq/L (ref 3.5–5.1)
Sodium: 141 mEq/L (ref 135–145)
Total Bilirubin: 0.4 mg/dL (ref 0.3–1.2)
Total Protein: 7 g/dL (ref 6.0–8.3)

## 2010-11-02 LAB — CULTURE, BLOOD (ROUTINE X 2)
Culture  Setup Time: 201110071433
Culture  Setup Time: 201110071433
Culture: NO GROWTH
Culture: NO GROWTH

## 2010-11-02 LAB — URINE CULTURE
Colony Count: 8000
Culture  Setup Time: 201110061554

## 2010-11-02 LAB — URINE MICROSCOPIC-ADD ON

## 2010-11-02 LAB — D-DIMER, QUANTITATIVE: D-Dimer, Quant: 0.82 ug/mL-FEU — ABNORMAL HIGH (ref 0.00–0.48)

## 2010-11-02 LAB — FOLATE: Folate: 11.2 ng/mL

## 2010-11-02 LAB — RETICULOCYTES
RBC.: 3.65 MIL/uL — ABNORMAL LOW (ref 3.87–5.11)
Retic Count, Absolute: 40.2 10*3/uL (ref 19.0–186.0)

## 2010-11-02 LAB — BRAIN NATRIURETIC PEPTIDE: Pro B Natriuretic peptide (BNP): 176 pg/mL — ABNORMAL HIGH (ref 0.0–100.0)

## 2010-11-02 LAB — HEMOGLOBIN A1C
Hgb A1c MFr Bld: 6.8 % — ABNORMAL HIGH (ref <5.7)
Mean Plasma Glucose: 148 mg/dL — ABNORMAL HIGH (ref <117)

## 2010-11-02 LAB — IRON AND TIBC: TIBC: 281 ug/dL (ref 250–470)

## 2010-11-02 LAB — VITAMIN B12: Vitamin B-12: 427 pg/mL (ref 211–911)

## 2010-11-02 LAB — C-REACTIVE PROTEIN: CRP: 17.6 mg/dL — ABNORMAL HIGH (ref <0.6)

## 2011-03-06 ENCOUNTER — Telehealth: Payer: Self-pay | Admitting: Cardiology

## 2011-03-06 NOTE — Telephone Encounter (Signed)
Received ROI via Fax,Mailed Pt records out all Cardiac 03/06/11/km

## 2011-03-19 ENCOUNTER — Telehealth: Payer: Self-pay | Admitting: Cardiology

## 2011-03-19 NOTE — Telephone Encounter (Signed)
ROI received via Fax, sent to [place] pt needs records from 2010-2012  03/19/11/km

## 2011-03-21 ENCOUNTER — Other Ambulatory Visit: Payer: Self-pay | Admitting: *Deleted

## 2011-03-21 MED ORDER — VALSARTAN 160 MG PO TABS: 160.0000 mg | ORAL_TABLET | Freq: Two times a day (BID) | ORAL | Status: DC

## 2011-05-01 ENCOUNTER — Encounter: Payer: Self-pay | Admitting: *Deleted

## 2011-05-02 ENCOUNTER — Encounter: Payer: Self-pay | Admitting: Cardiology

## 2011-05-02 ENCOUNTER — Ambulatory Visit (INDEPENDENT_AMBULATORY_CARE_PROVIDER_SITE_OTHER): Payer: Self-pay | Admitting: Cardiology

## 2011-05-02 VITALS — BP 156/72 | HR 92 | Ht 63.0 in | Wt >= 6400 oz

## 2011-05-02 DIAGNOSIS — E785 Hyperlipidemia, unspecified: Secondary | ICD-10-CM

## 2011-05-02 DIAGNOSIS — I5032 Chronic diastolic (congestive) heart failure: Secondary | ICD-10-CM

## 2011-05-02 DIAGNOSIS — I509 Heart failure, unspecified: Secondary | ICD-10-CM

## 2011-05-02 DIAGNOSIS — I10 Essential (primary) hypertension: Secondary | ICD-10-CM

## 2011-05-02 MED ORDER — CARVEDILOL 12.5 MG PO TABS: ORAL_TABLET | ORAL | Status: DC

## 2011-05-02 NOTE — Patient Instructions (Addendum)
Increase coreg(carvedilol) to 18.25m twice a day. This will be one and one-half 12.568mtablets twice a day.  Call the [facility] at [phone] making arrangements for bariatric surgery.  Dr McAundra Dubinas referred you to a nutritionist for weight loss.  Your physician recommends that you return for a FASTING lipid profile/liver profile/BMP/BNP  428.0  Your physician wants you to follow-up in: 4 months with Dr McAundra DubinJanuary 2013)You will receive a reminder letter in the mail two months in advance. If you don't receive a letter, please call our office to schedule the follow-up appointment.

## 2011-05-03 NOTE — Assessment & Plan Note (Addendum)
Nonischemic cardiomyopathy, last EF 45-50%.  Patient has long-standing CHF.  She does not appear severely volume overloaded today but difficult to tell given body habitus.  She has NYHA class III symptoms but I think that obesity and deconditioning also play a large role in her symptomatology.  Continue valsartan, torsemide, and spironolactone.  Will increase Coreg to 18.75 mg bid.

## 2011-05-03 NOTE — Assessment & Plan Note (Signed)
Patient continues to gain weight.  She is up another 17 lbs.  She is really not following a diet. She is unable to exercise due to exertional dyspnea and knee pain, both a consequence of her size.  She needs to work on her diet.  I will send her to see the [facility] nutritionist.  I am also going to refer her to the bariatric clinic.  Lap banding may be a good choice for her.

## 2011-05-03 NOTE — Assessment & Plan Note (Signed)
Elevated BP, will increase Coreg to 18.75 mg bid as above.

## 2011-05-03 NOTE — Progress Notes (Signed)
PCP: Dr. Redmond Pulling  60 yo with history of CHF (nonischemic CMP), morbid obesity, and hypertension presents for followup. She had been followed by a cardiologist in [place] until she moved with her family to [place]. She was diagnosed with CHF in 2003 and has been hospitalized 3 times. LHC showed no significant coronary disease but EF 30-35% on V-gram. Our followup echo showed EF 45-50%.   She is not very active. She is short of breath if she walks fast but can walk for 3-4 minutes at a time if she goes slowly. She can climb a flight of steps but it makes her very short of breath. She sleeps on 2 pillows but is not propped up much. These symptoms have been chronic. She is very limited by bilateral knee pain and back pain. She is walking with a walker.  Weight is up 17 lbs since last appointment; her diet is quite poor. She uses her torsemide daily.   Labs (8/10): K 4.1, creatinine 0.9, SPEP neg, ANA neg, BNP 32.4, TSH normal, HIV negative, LDL 160, HDL 54  Labs (10/10): TGs 256, HDL 45, LDL 160  Labs (12/10): creatinine 1.05, BNP 3  Labs (2/11): LDL 93, HDL 54, Mg 2, K 3.6, creatinine 1.3, LFTs normal  Labs (4/11): K 4.5, creatinine 1.01, TSH normal, BNP 19   Allergies (verified):  No Known Drug Allergies   Past Medical History:  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 She was hospitalized at [facility] in [place] where she had a cath showing normal coronaries but EF 30-35% on LV-gram. She has been hospitalized two other times since then in [place] for CHF. Echo (8/10) showed EF 45-50%, mild global HK, mild LVH, grade I diastolic dysfunction, no significant valvular problems.  2. HTN  3. Depression  4. Morbid obesity  5. Hyperlipidemia  6. OSA: using CPAP  7. Gout on allopurinol  8. Bilateral knee pain, probable osteoarthritis  9. Diabetes type II  10. Left lower leg cellulitis in 10/11 with normal [place] for DVT.   Family History:  No FH of premature CAD  asthma:  mother, 2 brothers  heart disease: mother  clotting disorders: mother (stroke)   Social History:  Recently moved from [place] to [place].  Married with 2 adopted children.  Never smoked, no ETOH or drugs.  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   Review of Systems  All systems reviewed and negative except as per HPI.   Current Outpatient Prescriptions  Medication Sig Dispense Refill  . allopurinol (ZYLOPRIM) 100 MG tablet Take 100 mg by mouth daily.        Marland Kitchen amitriptyline (ELAVIL) 50 MG tablet Take 50 mg by mouth at bedtime.        Marland Kitchen aspirin 81 MG tablet Take 81 mg by mouth daily.        Marland Kitchen Avocado Oil OIL by Does not apply route.        Marland Kitchen buPROPion (WELLBUTRIN SR) 150 MG 12 hr tablet Take 150 mg by mouth 3 (three) times daily.        . citalopram (CELEXA) 40 MG tablet Take 40 mg by mouth daily.        . cyclobenzaprine (FLEXERIL) 10 MG tablet Take 10 mg by mouth 3 (three) times daily as needed.        . diclofenac-misoprostol (ARTHROTEC 50) 50-0.2 MG per tablet Take 1 tablet by mouth 2 (two) times daily.        Marland Kitchen  ferrous fumarate (HEMOCYTE - 106 MG FE) 325 (106 FE) MG TABS Take 1 tablet by mouth.        . fish oil-omega-3 fatty acids 1000 MG capsule Take 2 g by mouth daily.        Marland Kitchen HYDROcodone-acetaminophen (NORCO) 5-325 MG per tablet Take 1 tablet by mouth every 6 (six) hours as needed.        . magnesium oxide (MAG-OX) 400 MG tablet Take 400 mg by mouth daily.        . metFORMIN (GLUCOPHAGE) 500 MG tablet Take 500 mg by mouth 2 (two) times daily with a meal.        . potassium chloride SA (K-DUR,KLOR-CON) 20 MEQ tablet Take 20 mEq by mouth 2 (two) times daily.        . pravastatin (PRAVACHOL) 40 MG tablet Take 80 mg by mouth every evening.        Marland Kitchen spironolactone (ALDACTONE) 25 MG tablet Take 25 mg by mouth daily.        . valsartan (DIOVAN) 160 MG tablet Take 1 tablet (160 mg total) by mouth 2 (two) times daily.  60 tablet  6  . vitamin E 400 UNIT capsule Take  400 Units by mouth daily.        . carvedilol (COREG) 12.5 MG tablet Take one and one-half tablets twice a day  90 tablet  6  . Cinnamon 500 MG capsule Take 500 mg by mouth daily.        Marland Kitchen torsemide (DEMADEX) 20 MG tablet Take 3 tablets daily in the morning at the same time      . traMADol (ULTRAM) 50 MG tablet Take 50 mg by mouth every 8 (eight) hours as needed.          BP 156/72  Pulse 92  Ht _0  (1.6 m)  Wt 408 lb (185.068 kg)  BMI 72.27 kg/m2 General:  morbidly obese female in nad Neck:  Neck supple, no JVD. No masses, thyromegaly or abnormal cervical nodes. Lungs:  Clear bilaterally to auscultation and percussion. Heart:  Non-displaced PMI, chest non-tender; regular rate and rhythm, S1, S2 without rubs or gallops, 1/6 SEM RUSB. Carotid upstroke normal, no bruit. Pedals normal pulses. 1+ edema left lower leg, trace edema right ankle.   Abdomen:  Bowel sounds positive; abdomen soft and non-tender without masses, organomegaly, or hernias noted. No hepatosplenomegaly. Extremities:  No clubbing or cyanosis. Neurologic:  Alert and oriented x 3. Psych:  Normal affect.

## 2011-05-03 NOTE — Assessment & Plan Note (Signed)
Check fasting lipids/LFTs.

## 2011-05-10 ENCOUNTER — Other Ambulatory Visit: Payer: Self-pay | Admitting: *Deleted

## 2011-08-23 ENCOUNTER — Ambulatory Visit: Payer: Self-pay | Attending: Family Medicine | Admitting: Rehabilitative and Restorative Service Providers"

## 2011-08-23 DIAGNOSIS — IMO0001 Reserved for inherently not codable concepts without codable children: Secondary | ICD-10-CM | POA: Insufficient documentation

## 2011-08-23 DIAGNOSIS — R262 Difficulty in walking, not elsewhere classified: Secondary | ICD-10-CM | POA: Insufficient documentation

## 2011-08-23 DIAGNOSIS — M25569 Pain in unspecified knee: Secondary | ICD-10-CM | POA: Insufficient documentation

## 2011-08-23 DIAGNOSIS — M6281 Muscle weakness (generalized): Secondary | ICD-10-CM | POA: Insufficient documentation

## 2011-09-04 ENCOUNTER — Ambulatory Visit: Payer: Self-pay

## 2011-09-06 ENCOUNTER — Ambulatory Visit: Payer: Self-pay

## 2011-09-11 ENCOUNTER — Ambulatory Visit: Payer: Self-pay | Admitting: Rehabilitative and Restorative Service Providers"

## 2011-09-13 ENCOUNTER — Ambulatory Visit: Payer: Self-pay | Admitting: Rehabilitative and Restorative Service Providers"

## 2011-09-18 ENCOUNTER — Encounter: Payer: Self-pay | Admitting: Physical Therapy

## 2011-09-20 ENCOUNTER — Ambulatory Visit: Payer: Self-pay | Admitting: Rehabilitative and Restorative Service Providers"

## 2011-10-02 ENCOUNTER — Ambulatory Visit: Payer: Self-pay | Attending: Family Medicine | Admitting: Physical Therapy

## 2011-10-02 DIAGNOSIS — R262 Difficulty in walking, not elsewhere classified: Secondary | ICD-10-CM | POA: Insufficient documentation

## 2011-10-02 DIAGNOSIS — IMO0001 Reserved for inherently not codable concepts without codable children: Secondary | ICD-10-CM | POA: Insufficient documentation

## 2011-10-02 DIAGNOSIS — M6281 Muscle weakness (generalized): Secondary | ICD-10-CM | POA: Insufficient documentation

## 2011-10-02 DIAGNOSIS — M25569 Pain in unspecified knee: Secondary | ICD-10-CM | POA: Insufficient documentation

## 2011-10-04 ENCOUNTER — Ambulatory Visit: Payer: Self-pay

## 2011-11-22 ENCOUNTER — Other Ambulatory Visit: Payer: Self-pay

## 2011-11-22 MED ORDER — VALSARTAN 160 MG PO TABS: 160.0000 mg | ORAL_TABLET | Freq: Two times a day (BID) | ORAL | Status: DC

## 2012-06-13 ENCOUNTER — Other Ambulatory Visit: Payer: Self-pay | Admitting: *Deleted

## 2012-06-13 MED ORDER — VALSARTAN 160 MG PO TABS: 160.0000 mg | ORAL_TABLET | Freq: Two times a day (BID) | ORAL | Status: DC

## 2012-07-16 ENCOUNTER — Other Ambulatory Visit: Payer: Self-pay

## 2012-07-16 MED ORDER — VALSARTAN 160 MG PO TABS: 160.0000 mg | ORAL_TABLET | Freq: Two times a day (BID) | ORAL | Status: DC

## 2012-09-26 ENCOUNTER — Emergency Department (HOSPITAL_COMMUNITY): Payer: Medicaid Other

## 2012-09-26 ENCOUNTER — Encounter (HOSPITAL_COMMUNITY): Payer: Self-pay | Admitting: *Deleted

## 2012-09-26 ENCOUNTER — Inpatient Hospital Stay (HOSPITAL_COMMUNITY)
Admission: EM | Admit: 2012-09-26 | Discharge: 2012-09-30 | DRG: 291 | Disposition: A | Discharge status: Home Health | Payer: Medicaid Other | Attending: Internal Medicine | Admitting: Internal Medicine

## 2012-09-26 DIAGNOSIS — G4733 Obstructive sleep apnea (adult) (pediatric): Secondary | ICD-10-CM | POA: Diagnosis present

## 2012-09-26 DIAGNOSIS — F329 Major depressive disorder, single episode, unspecified: Secondary | ICD-10-CM | POA: Diagnosis present

## 2012-09-26 DIAGNOSIS — I509 Heart failure, unspecified: Secondary | ICD-10-CM

## 2012-09-26 DIAGNOSIS — J9690 Respiratory failure, unspecified, unspecified whether with hypoxia or hypercapnia: Secondary | ICD-10-CM

## 2012-09-26 DIAGNOSIS — E785 Hyperlipidemia, unspecified: Secondary | ICD-10-CM | POA: Diagnosis present

## 2012-09-26 DIAGNOSIS — I428 Other cardiomyopathies: Secondary | ICD-10-CM | POA: Diagnosis present

## 2012-09-26 DIAGNOSIS — M109 Gout, unspecified: Secondary | ICD-10-CM | POA: Diagnosis present

## 2012-09-26 DIAGNOSIS — Z7982 Long term (current) use of aspirin: Secondary | ICD-10-CM

## 2012-09-26 DIAGNOSIS — J9600 Acute respiratory failure, unspecified whether with hypoxia or hypercapnia: Secondary | ICD-10-CM

## 2012-09-26 DIAGNOSIS — I10 Essential (primary) hypertension: Secondary | ICD-10-CM

## 2012-09-26 DIAGNOSIS — Z91199 Patient's noncompliance with other medical treatment and regimen due to unspecified reason: Secondary | ICD-10-CM

## 2012-09-26 DIAGNOSIS — J9620 Acute and chronic respiratory failure, unspecified whether with hypoxia or hypercapnia: Secondary | ICD-10-CM | POA: Diagnosis present

## 2012-09-26 DIAGNOSIS — F3289 Other specified depressive episodes: Secondary | ICD-10-CM | POA: Diagnosis present

## 2012-09-26 DIAGNOSIS — J811 Chronic pulmonary edema: Secondary | ICD-10-CM

## 2012-09-26 DIAGNOSIS — J9601 Acute respiratory failure with hypoxia: Secondary | ICD-10-CM

## 2012-09-26 DIAGNOSIS — Z79899 Other long term (current) drug therapy: Secondary | ICD-10-CM

## 2012-09-26 DIAGNOSIS — E119 Type 2 diabetes mellitus without complications: Secondary | ICD-10-CM

## 2012-09-26 DIAGNOSIS — Z6841 Body Mass Index (BMI) 40.0 and over, adult: Secondary | ICD-10-CM

## 2012-09-26 DIAGNOSIS — Z23 Encounter for immunization: Secondary | ICD-10-CM

## 2012-09-26 DIAGNOSIS — I5023 Acute on chronic systolic (congestive) heart failure: Principal | ICD-10-CM | POA: Diagnosis present

## 2012-09-26 DIAGNOSIS — I5031 Acute diastolic (congestive) heart failure: Secondary | ICD-10-CM

## 2012-09-26 DIAGNOSIS — Z9119 Patient's noncompliance with other medical treatment and regimen: Secondary | ICD-10-CM

## 2012-09-26 DIAGNOSIS — G9340 Encephalopathy, unspecified: Secondary | ICD-10-CM | POA: Diagnosis present

## 2012-09-26 HISTORY — DX: Acute respiratory failure with hypoxia: J96.01

## 2012-09-26 HISTORY — DX: Essential (primary) hypertension: I10

## 2012-09-26 LAB — COMPREHENSIVE METABOLIC PANEL
ALT: 36 U/L — ABNORMAL HIGH (ref 0–35)
Alkaline Phosphatase: 85 U/L (ref 39–117)
BUN: 13 mg/dL (ref 6–23)
CO2: 34 mEq/L — ABNORMAL HIGH (ref 19–32)
Chloride: 98 mEq/L (ref 96–112)
GFR calc Af Amer: 71 mL/min — ABNORMAL LOW (ref >90)
Glucose, Bld: 93 mg/dL (ref 70–99)
Potassium: 3.9 mEq/L (ref 3.5–5.1)
Total Bilirubin: 0.3 mg/dL (ref 0.3–1.2)

## 2012-09-26 LAB — URINALYSIS, MICROSCOPIC ONLY
Hgb urine dipstick: NEGATIVE
Nitrite: NEGATIVE
Protein, ur: 100 mg/dL — AB
Specific Gravity, Urine: 1.016 (ref 1.005–1.030)
Urobilinogen, UA: 0.2 mg/dL (ref 0.0–1.0)

## 2012-09-26 LAB — URINALYSIS, ROUTINE W REFLEX MICROSCOPIC
Bilirubin Urine: NEGATIVE
Hgb urine dipstick: NEGATIVE
Ketones, ur: NEGATIVE mg/dL
Protein, ur: NEGATIVE mg/dL
Urobilinogen, UA: 0.2 mg/dL (ref 0.0–1.0)

## 2012-09-26 LAB — PROTIME-INR: Prothrombin Time: 12.5 seconds (ref 11.6–15.2)

## 2012-09-26 LAB — POCT I-STAT 3, ART BLOOD GAS (G3+)
Acid-Base Excess: 5 mmol/L — ABNORMAL HIGH (ref 0.0–2.0)
Bicarbonate: 34.3 mEq/L — ABNORMAL HIGH (ref 20.0–24.0)
TCO2: 36 mmol/L (ref 0–100)
pH, Arterial: 7.314 — ABNORMAL LOW (ref 7.350–7.450)
pO2, Arterial: 96 mmHg (ref 80.0–100.0)

## 2012-09-26 LAB — CBC
HCT: 44.1 % (ref 36.0–46.0)
MCHC: 32.2 g/dL (ref 30.0–36.0)
Platelets: 196 10*3/uL (ref 150–400)
RDW: 12.7 % (ref 11.5–15.5)
WBC: 8.6 10*3/uL (ref 4.0–10.5)

## 2012-09-26 LAB — POCT I-STAT, CHEM 8
BUN: 14 mg/dL (ref 6–23)
Creatinine, Ser: 0.9 mg/dL (ref 0.50–1.10)
Glucose, Bld: 160 mg/dL — ABNORMAL HIGH (ref 70–99)
Sodium: 145 mEq/L (ref 135–145)
TCO2: 31 mmol/L (ref 0–100)

## 2012-09-26 LAB — TROPONIN I: Troponin I: <0.3 ng/mL (ref <0.30)

## 2012-09-26 LAB — POCT I-STAT TROPONIN I

## 2012-09-26 LAB — URINE MICROSCOPIC-ADD ON

## 2012-09-26 MED ORDER — PNEUMOCOCCAL VAC POLYVALENT 25 MCG/0.5ML IJ INJ
0.5000 mL | INJECTION | INTRAMUSCULAR | Status: AC
  Administered 2012-09-27: 0.5 mL via INTRAMUSCULAR
  Filled 2012-09-26: qty 0.5

## 2012-09-26 MED ORDER — ONDANSETRON HCL 4 MG PO TABS: 4.0000 mg | ORAL_TABLET | Freq: Four times a day (QID) | ORAL | Status: DC | PRN

## 2012-09-26 MED ORDER — HEPARIN SODIUM (PORCINE) 5000 UNIT/ML IJ SOLN
5000.0000 [IU] | Freq: Three times a day (TID) | INTRAMUSCULAR | Status: DC
  Administered 2012-09-26 – 2012-09-28 (×6): 5000 [IU] via SUBCUTANEOUS
  Filled 2012-09-26 (×9): qty 1

## 2012-09-26 MED ORDER — ACETAMINOPHEN 650 MG RE SUPP: 650.0000 mg | Freq: Four times a day (QID) | RECTAL | Status: DC | PRN

## 2012-09-26 MED ORDER — INSULIN ASPART 100 UNIT/ML ~~LOC~~ SOLN: 0.0000 [IU] | SUBCUTANEOUS | Status: DC

## 2012-09-26 MED ORDER — SUCCINYLCHOLINE CHLORIDE 20 MG/ML IJ SOLN
INTRAMUSCULAR | Status: AC
  Filled 2012-09-26: qty 1

## 2012-09-26 MED ORDER — ASPIRIN EC 325 MG PO TBEC
325.0000 mg | DELAYED_RELEASE_TABLET | Freq: Every day | ORAL | Status: DC
  Administered 2012-09-26 – 2012-09-30 (×5): 325 mg via ORAL
  Filled 2012-09-26 (×5): qty 1

## 2012-09-26 MED ORDER — FUROSEMIDE 10 MG/ML IJ SOLN: 100.0000 mg | Freq: Once | INTRAVENOUS | Status: DC

## 2012-09-26 MED ORDER — SODIUM CHLORIDE 0.9 % IJ SOLN
3.0000 mL | Freq: Two times a day (BID) | INTRAMUSCULAR | Status: DC
  Administered 2012-09-27 – 2012-09-30 (×7): 3 mL via INTRAVENOUS

## 2012-09-26 MED ORDER — ETOMIDATE 2 MG/ML IV SOLN
INTRAVENOUS | Status: AC
  Filled 2012-09-26: qty 20

## 2012-09-26 MED ORDER — LIDOCAINE HCL (CARDIAC) 20 MG/ML IV SOLN
INTRAVENOUS | Status: AC
  Filled 2012-09-26: qty 5

## 2012-09-26 MED ORDER — NITROGLYCERIN IN D5W 200-5 MCG/ML-% IV SOLN
2.0000 ug/min | Freq: Once | INTRAVENOUS | Status: AC
  Administered 2012-09-26: 5 ug/min via INTRAVENOUS

## 2012-09-26 MED ORDER — FUROSEMIDE 10 MG/ML IJ SOLN
INTRAMUSCULAR | Status: AC
  Filled 2012-09-26: qty 2

## 2012-09-26 MED ORDER — FUROSEMIDE 10 MG/ML IJ SOLN
80.0000 mg | Freq: Three times a day (TID) | INTRAMUSCULAR | Status: DC
  Administered 2012-09-26 – 2012-09-28 (×6): 80 mg via INTRAVENOUS
  Filled 2012-09-26 (×11): qty 8

## 2012-09-26 MED ORDER — SIMVASTATIN 40 MG PO TABS
40.0000 mg | ORAL_TABLET | Freq: Every day | ORAL | Status: DC
  Administered 2012-09-26 – 2012-09-30 (×5): 40 mg via ORAL
  Filled 2012-09-26 (×5): qty 1

## 2012-09-26 MED ORDER — BIOTENE DRY MOUTH MT LIQD
15.0000 mL | Freq: Two times a day (BID) | OROMUCOSAL | Status: DC
  Administered 2012-09-26 – 2012-09-30 (×8): 15 mL via OROMUCOSAL

## 2012-09-26 MED ORDER — FUROSEMIDE 10 MG/ML IJ SOLN
INTRAMUSCULAR | Status: AC
  Administered 2012-09-26: 100 mg
  Filled 2012-09-26: qty 8

## 2012-09-26 MED ORDER — ALLOPURINOL 100 MG PO TABS
100.0000 mg | ORAL_TABLET | Freq: Every day | ORAL | Status: DC
  Administered 2012-09-27 – 2012-09-30 (×4): 100 mg via ORAL
  Filled 2012-09-26 (×4): qty 1

## 2012-09-26 MED ORDER — ONDANSETRON HCL 4 MG/2ML IJ SOLN: 4.0000 mg | Freq: Four times a day (QID) | INTRAMUSCULAR | Status: DC | PRN

## 2012-09-26 MED ORDER — ROCURONIUM BROMIDE 50 MG/5ML IV SOLN
INTRAVENOUS | Status: AC
  Filled 2012-09-26: qty 2

## 2012-09-26 MED ORDER — NITROGLYCERIN IN D5W 200-5 MCG/ML-% IV SOLN: 2.0000 ug/min | Freq: Once | INTRAVENOUS | Status: DC

## 2012-09-26 MED ORDER — INFLUENZA VIRUS VACC SPLIT PF IM SUSP
0.5000 mL | INTRAMUSCULAR | Status: AC
  Administered 2012-09-27: 0.5 mL via INTRAMUSCULAR
  Filled 2012-09-26: qty 0.5

## 2012-09-26 MED ORDER — NITROGLYCERIN IN D5W 200-5 MCG/ML-% IV SOLN
INTRAVENOUS | Status: AC
  Filled 2012-09-26: qty 250

## 2012-09-26 MED ORDER — ACETAMINOPHEN 325 MG PO TABS: 650.0000 mg | ORAL_TABLET | Freq: Four times a day (QID) | ORAL | Status: DC | PRN

## 2012-09-26 MED ORDER — TRAMADOL HCL 50 MG PO TABS
50.0000 mg | ORAL_TABLET | Freq: Three times a day (TID) | ORAL | Status: DC | PRN
  Administered 2012-09-27 – 2012-09-29 (×5): 50 mg via ORAL
  Filled 2012-09-26 (×5): qty 1

## 2012-09-26 NOTE — Progress Notes (Signed)
[facility] ED RN reports pt is on bipap but spouse would be available to speak with CM.  CM requesting information be provided to spouse.  Fax confirmation received at [phone] for affordable care information sent for spouse. CM spoke with Mr Oconnor via telephone to encourage focusing on the pt's care and medical improvement at this time and when pt is better a financial counselor and/or registration/billing may be available to assist with the medical bill. CM reviewed with Mr Popovich that she has sent affordable care and pcp information to be given to him that can be used to assist with obtaining insurance and a pcp if needed once pt's medical status has improved.  CM allowed spouse to ask questions and provided answers.  Cm encouraged him to have staff call CM if he reviews information and has further questions CM signing off

## 2012-09-26 NOTE — Consult Note (Signed)
PULMONARY  / CRITICAL CARE MEDICINE  Name: Robin Hardin MRN: [id number] DOB: Oct 01, 1950    ADMISSION DATE:  09/26/2012 CONSULTATION DATE:  2/7  REFERRING MD :  Owens Shark   CHIEF COMPLAINT:  Acute resp failure   BRIEF PATIENT DESCRIPTION:  52 YOAAF admitted on 2/7 w/ AMS and acute respiratory failure in setting of diffuse pulmonary edema. On arrival found to be hypertensive. Treated w/ ntg gtt, IV diuresis and NIPPV. PCCM asked to see in ER on 2/7 given concern pt might require intubation. Pt had bradycardic event (presumably d/t hypoxia) in transport to ER. This resulted in traumatic attempt at nasal intubation. On PCCM arrival pt awake and following commands.   SIGNIFICANT EVENTS / STUDIES:  2/7 traumatic nasal intubation attempt>>>  LINES / TUBES:   CULTURES:   ANTIBIOTICS:   HISTORY OF PRESENT ILLNESS:   65 YOAAF admitted on 2/7 w/ AMS and acute respiratory failure in setting of diffuse pulmonary edema. Per family has h/o slowly progressive dyspnea, lower ext swelling but no cough, CP, fever, sick exposures. Does not take her diuretics d/t leg cramping. On arrival found to be hypertensive. Treated w/ ntg gtt, IV diuresis and NIPPV. PCCM asked to see in ER on 2/7 given concern pt might require intubation. Pt had bradycardic event (presumably d/t hypoxia) in transport to ER. This resulted in traumatic attempt at nasal intubation. On PCCM arrival pt awake and following commands.    PAST MEDICAL HISTORY :  Past Medical History  Diagnosis Date  . Hypertension   . Hyperlipidemia   . Diabetes mellitus   . Depression   . Nonischemic cardiomyopathy   . CHF (congestive heart failure) 2003    EF 30-35% on LV gram, Echo EF 45-50%  . Morbid obesity   . OSA on CPAP   . Gout   . Bilateral knee pain   . Cellulitis of lower leg     left  . H/O: hysterectomy    History reviewed. No pertinent past surgical history. Prior to Admission medications   Medication Sig Start Date End Date  Taking? Authorizing Provider  allopurinol (ZYLOPRIM) 100 MG tablet Take 100 mg by mouth daily.      Historical Provider, MD  amitriptyline (ELAVIL) 50 MG tablet Take 50 mg by mouth at bedtime.      Historical Provider, MD  aspirin 81 MG tablet Take 81 mg by mouth daily.      Historical Provider, MD  Avocado Oil OIL by Does not apply route.      Historical Provider, MD  buPROPion (WELLBUTRIN SR) 150 MG 12 hr tablet Take 150 mg by mouth 3 (three) times daily.      Historical Provider, MD  carvedilol (COREG) 12.5 MG tablet Take one and one-half tablets twice a day 05/02/11   Larey Dresser, MD  Cinnamon 500 MG capsule Take 500 mg by mouth daily.      Historical Provider, MD  citalopram (CELEXA) 40 MG tablet Take 40 mg by mouth daily.      Historical Provider, MD  cyclobenzaprine (FLEXERIL) 10 MG tablet Take 10 mg by mouth 3 (three) times daily as needed.      Historical Provider, MD  diclofenac-misoprostol (ARTHROTEC 50) 50-0.2 MG per tablet Take 1 tablet by mouth 2 (two) times daily.      Historical Provider, MD  ferrous fumarate (HEMOCYTE - 106 MG FE) 325 (106 FE) MG TABS Take 1 tablet by mouth.      Historical Provider, MD  fish oil-omega-3 fatty acids 1000 MG capsule Take 2 g by mouth daily.      Historical Provider, MD  HYDROcodone-acetaminophen (NORCO) 5-325 MG per tablet Take 1 tablet by mouth every 6 (six) hours as needed.      Historical Provider, MD  magnesium oxide (MAG-OX) 400 MG tablet Take 400 mg by mouth daily.      Historical Provider, MD  metFORMIN (GLUCOPHAGE) 500 MG tablet Take 500 mg by mouth 2 (two) times daily with a meal.      Historical Provider, MD  potassium chloride SA (K-DUR,KLOR-CON) 20 MEQ tablet Take 20 mEq by mouth 2 (two) times daily.      Historical Provider, MD  pravastatin (PRAVACHOL) 40 MG tablet Take 80 mg by mouth every evening.      Historical Provider, MD  spironolactone (ALDACTONE) 25 MG tablet Take 25 mg by mouth daily.      Historical Provider, MD   torsemide (DEMADEX) 20 MG tablet Take 3 tablets daily in the morning at the same time 05/02/11   Larey Dresser, MD  traMADol (ULTRAM) 50 MG tablet Take 50 mg by mouth every 8 (eight) hours as needed.      Historical Provider, MD  valsartan (DIOVAN) 160 MG tablet Take 1 tablet (160 mg total) by mouth 2 (two) times daily. 07/16/12 07/16/13  Larey Dresser, MD  vitamin E 400 UNIT capsule Take 400 Units by mouth daily.      Historical Provider, MD   No Known Allergies  FAMILY HISTORY:  Family History  Problem Relation Age of Onset  . Asthma Mother   . Asthma Brother   . Asthma Brother   . Heart disease Mother   . Stroke Mother     clotting disorders   SOCIAL HISTORY:  reports that she has never smoked. She does not have any smokeless tobacco history on file. She reports that she does not drink alcohol or use illicit drugs.  REVIEW OF SYSTEMS:  Bolds positive  Review of Systems  Constitutional: No weight loss, + gain, night sweats, Fevers, chills, fatigue .  HEENT: No headaches, visual changes, Difficulty swallowing, Tooth/dental problems, or Sore throat,  No sneezing, itching, ear ache, nasal congestion, post nasal drip, no visual complaints CV: No chest pain, Orthopnea, PND, swelling in lower extremities, dizziness, palpitations, syncope.  GI No heartburn, indigestion, abdominal pain, nausea, vomiting, diarrhea, change in bowel habits, loss of appetite, bloody stools.  Resp: No cough, No coughing up of blood. No change in color of mucus. No wheezing. Slowly progressive SOB  Skin: no rash or itching or icterus GU: no dysuria, change in color of urine, no urgency or frequency. No flank pain, no hematuria  MS: No joint pain or swelling. No decreased range of motion  Psych: No change in mood or affect. No depression or anxiety.  Neuro: no difficulty with speech, weakness, numbness, ataxia    SUBJECTIVE:  Feels better  VITAL SIGNS: Temp:  [97.3 F (36.3 C)-98.7 F (37.1 C)] 97.3  F (36.3 C) (02/07 1200) Pulse Rate:  [76-95] 76  (02/07 1200) Resp:  [15-25] 15  (02/07 1200) BP: (126-180)/(62-147) 129/69 mmHg (02/07 1200) SpO2:  [88 %-100 %] 100 % (02/07 1200) HEMODYNAMICS:   VENTILATOR SETTINGS:   INTAKE / OUTPUT: Intake/Output    None     PHYSICAL EXAMINATION: General:  MOAAF, no acute distress.  Neuro:  Awake, alert, no focal def HEENT:  Dried blood on face, mouth and right nare Cardiovascular:  Dist RRR Lungs: decreased  Abdomen:  Obese + bowel sounds  Musculoskeletal:  Intact  Skin:  Chronic LE edema   LABS:  Lab 09/26/12 1252 09/26/12 1052 09/26/12 1020  HGB -- 15.3* 14.2  WBC -- -- 8.6  PLT -- -- 196  NA -- 145 --  K -- 4.1 --  CL -- 102 --  CO2 -- -- --  GLUCOSE -- 160* --  BUN -- 14 --  CREATININE -- 0.90 --  CALCIUM -- -- --  MG -- -- --  PHOS -- -- --  AST -- -- --  ALT -- -- --  ALKPHOS -- -- --  BILITOT -- -- --  PROT -- -- --  ALBUMIN -- -- --  APTT -- -- --  INR -- -- --  LATICACIDVEN -- -- --  TROPONINI -- -- --  PROCALCITON -- -- --  PROBNP -- -- --  O2SATVEN -- -- --  PHART 7.314* -- --  PCO2ART 66.8* -- --  PO2ART 96.0 -- --   No results found for this basename: GLUCAP:5 in the last 168 hours  CXR: CM, diffuse infiltrates   ASSESSMENT / PLAN:  PULMONARY A: 1) Acute respiratory failure in setting of diffuse pulmonary infiltrates.  This is most likely all pulmonary edema, however did have traumatic attempt at intubation so if worsens aspiration could be considered on diff dx. She appears to be responding well to NIPPV.  2) H/o OSA  P:   Diurese as tol BIPAP PRN and mandatory at HS F/u cxr See ID section   CARDIOVASCULAR A: decompensated systolic heart failure w/ pulmonary edema.  Think this is primarily an issue of non-compliance w/ diuretics.  P:  Cycle enzymes BP control Wean NTG in next 12h Cont diuresis F/u new echo  RENAL A:  No acute issue  P:   High risk for hypokalemia in setting  of diuresis Close obs of chemistry and I&O Hold NSAIDs  GASTROINTESTINAL A:  No acute issue  P:   PPI  HEMATOLOGIC A:  No acute issue  P:  Trend CBC   INFECTIOUS A:  No evidence of infection. Small consideration for aspiration, but doubt.  P:   Trend PCT (for completeness)   ENDOCRINE A:  DM P:   ssi   NEUROLOGIC A:  Acute encephalopathy: this has already resolved. Was d/t hypoxia and hypercarbia.  P:   Supportive care    TODAY'S SUMMARY:  Admitted with acute respiratory failure due to pulmonary edema. Improved with excellent care in ED - NTG, Lasix, NPPV. She does not require intubation @ present. She appears sufficiently improved to try off BiPAP. Watch in ICU overnight. Anticipate with further improvement that she will be able to transfer out of ICU in AM 2/08.   Merton Border, MD ; [facility] [phone].  After 5:30 PM or weekends, call [phone]   09/26/2012, 1:53 PM

## 2012-09-26 NOTE — Progress Notes (Signed)
  Echocardiogram 2D Echocardiogram has been performed.  Keiran Gaffey FRANCES 09/26/2012, 6:19 PM

## 2012-09-26 NOTE — ED Notes (Signed)
Placed size 14 french temp foley into patient dark yellow urine in return

## 2012-09-26 NOTE — H&P (Signed)
Hospital Admission Note Date: 09/26/2012  Patient name: Robin Hardin Medical record number: [id number] Date of birth: August 14, 1951 Age: 62 y.o. Gender: female PCP: BEAUCHAMP,CHARLES  _________________________________________________________ INTERNAL MEDICINE TEACHING SERVICE CONTACT INFO     Weekday Hours (7AM-5PM): ** If no return call within 15 minutes (after trying both pagers listed below), please call after hours pagers.    First Contact:  Dr. Sissy Hoff   Pager:  [phone] Second Contact:   Dr. Owens Shark   Pager:  [phone]      After Hours (after 5PM)/ Weekend / Holidays: First Contact:              Pager: [phone] Second Contact:         Pager: [phone] _________________________________________________________   Chief Complaint: Shortness of breath  History of Present Illness:  Robin Hardin is a 62 year old female with a history of CHF, hypertension, diabetes, who presents with acute worsening shortness of breath. Most of the history is obtained from her husband and her daughter as patient is unable to answer most questions.  According to the husband, patient, patient had not been feeling well over the past several weeks. Starting this week, she started to feel better but then got acutely worse over the last 2-3 days. She was having worsening shortness of breath, dyspnea on exertion, and nonproductive cough. The morning of admission, husband last saw the patient this morning in bed but did not talk to her before leaving the house. Patient apparently called her daughter later this morning saying "she could not breathe". Daughter called EMS, who reported that patient was hypoxic and satting 83% on nonrebreather.  Patient's daughter apparently helps her with her medicines. Patient and the daughter states that she had not been taking any fluid pills for at least the past 6 months. Daughter states patient was noncompliant with this medication due to cramps.   Patient does state that she has  had chest pain that she is unable to tell us when it started or where it is located. She also endorses increased swelling in her legs.  Patient denies fever, chills, sick contacts.  Meds: Current Outpatient Rx  Name  Route  Sig  Dispense  Refill  . ALLOPURINOL 100 MG PO TABS   Oral   Take 100 mg by mouth daily.           Marland Kitchen AMITRIPTYLINE HCL 50 MG PO TABS   Oral   Take 50 mg by mouth at bedtime.           . ASPIRIN 81 MG PO TABS   Oral   Take 81 mg by mouth daily.           . AVOCADO OIL OIL   Does not apply   by Does not apply route.           . BUPROPION HCL ER (SR) 150 MG PO TB12   Oral   Take 150 mg by mouth 3 (three) times daily.           Marland Kitchen CARVEDILOL 12.5 MG PO TABS      Take one and one-half tablets twice a day   90 tablet   6   . CINNAMON 500 MG PO CAPS   Oral   Take 500 mg by mouth daily.           Marland Kitchen CITALOPRAM HYDROBROMIDE 40 MG PO TABS   Oral   Take 40 mg by mouth daily.           Marland Kitchen  CYCLOBENZAPRINE HCL 10 MG PO TABS   Oral   Take 10 mg by mouth 3 (three) times daily as needed.           Marland Kitchen DICLOFENAC-MISOPROSTOL 50-200 MG-MCG PO TABS   Oral   Take 1 tablet by mouth 2 (two) times daily.           Marland Kitchen FERROUS FUMARATE 325 (106 FE) MG PO TABS   Oral   Take 1 tablet by mouth.           . OMEGA-3 FATTY ACIDS 1000 MG PO CAPS   Oral   Take 2 g by mouth daily.           Marland Kitchen HYDROCODONE-ACETAMINOPHEN 5-325 MG PO TABS   Oral   Take 1 tablet by mouth every 6 (six) hours as needed.           Marland Kitchen MAGNESIUM OXIDE 400 MG PO TABS   Oral   Take 400 mg by mouth daily.           Marland Kitchen METFORMIN HCL 500 MG PO TABS   Oral   Take 500 mg by mouth 2 (two) times daily with a meal.           . POTASSIUM CHLORIDE CRYS ER 20 MEQ PO TBCR   Oral   Take 20 mEq by mouth 2 (two) times daily.           Marland Kitchen PRAVASTATIN SODIUM 40 MG PO TABS   Oral   Take 80 mg by mouth every evening.           Marland Kitchen SPIRONOLACTONE 25 MG PO TABS   Oral   Take 25 mg  by mouth daily.           . TORSEMIDE 20 MG PO TABS      Take 3 tablets daily in the morning at the same time         . TRAMADOL HCL 50 MG PO TABS   Oral   Take 50 mg by mouth every 8 (eight) hours as needed.           Marland Kitchen VALSARTAN 160 MG PO TABS   Oral   Take 1 tablet (160 mg total) by mouth 2 (two) times daily.   60 tablet   0     Patient Needs to make appointment   . VITAMIN E 400 UNITS PO CAPS   Oral   Take 400 Units by mouth daily.             Allergies: Allergies as of 09/26/2012  . (No Known Allergies)   Past Medical History  Diagnosis Date  . Hypertension   . Hyperlipidemia   . Diabetes mellitus   . Depression   . Nonischemic cardiomyopathy   . CHF (congestive heart failure) 2003    EF 30-35% on LV gram, Echo EF 45-50%  . Morbid obesity   . OSA on CPAP   . Gout   . Bilateral knee pain   . Cellulitis of lower leg     left  . H/O: hysterectomy    History reviewed. No pertinent past surgical history. Family History  Problem Relation Age of Onset  . Asthma Mother   . Asthma Brother   . Asthma Brother   . Heart disease Mother   . Stroke Mother     clotting disorders   History   Social History  . Marital Status: Married    Spouse Name: N/A  Number of Children: 2  . Years of Education: N/A   Occupational History  . retired cna    Social History Main Topics  . Smoking status: Never Smoker   . Smokeless tobacco: Not on file  . Alcohol Use: No  . Drug Use: No  . Sexually Active: Not on file   Other Topics Concern  . Not on file   Social History Narrative  . No narrative on file    Review of Systems: ROS negative except as noted in history of present illness  Physical Exam Blood pressure 180/147, pulse 80, temperature 98.2 F (36.8 C), resp. rate 19, SpO2 99.00%. General:  Somnolent, intermittently arousable, morbidly obese HEENT:  PERRL, BIPAP in place Cardiovascular:  Regular rate and rhythm, distant heart  sounds Respiratory:  Rales throughout, upper airways noises, Bipap in place Abdomen:  Soft, nondistended, nontender, bowel sounds present Extremities:  Cool, dry, 2+ edema, +DP pulses Skin: Warm, dry, no rashes Neuro: Following minimal commands, opens eyes to verbal and tactile stimuli, speaks one or two words then falls back asleep.   Lab results: Basic Metabolic Panel:  Basename 09/26/12 1052  NA 145  K 4.1  CL 102  CO2 --  GLUCOSE 160*  BUN 14  CREATININE 0.90  CALCIUM --  MG --  PHOS --   CBC:  Basename 09/26/12 1052 09/26/12 1020  WBC -- 8.6  NEUTROABS -- --  HGB 15.3* 14.2  HCT 45.0 44.1  MCV -- 94.4  PLT -- 196   Urinalysis:  Basename 09/26/12 1034  COLORURINE YELLOW  LABSPEC 1.016  PHURINE 6.0  GLUCOSEU NEGATIVE  HGBUR NEGATIVE  BILIRUBINUR NEGATIVE  KETONESUR NEGATIVE  PROTEINUR 100*  UROBILINOGEN 0.2  NITRITE NEGATIVE  LEUKOCYTESUR NEGATIVE     Imaging results:  Dg Chest Portable 1 View  09/26/2012  *RADIOLOGY REPORT*  Clinical Data: Shortness of breath.  PORTABLE CHEST - 1 VIEW  Comparison: None.  Findings: Mild cardiomegaly is noted.  Bilateral diffuse interstitial and alveolar opacities are noted concerning for pulmonary edema and congestive heart failure, or possibly pneumonia.  Bony thorax is intact.  IMPRESSION: Bilateral diffuse interstitial and alveolar opacities are noted most consistent with edema and congestive heart failure, although pneumonia cannot be excluded.   Original Report Authenticated By: Marijo Conception.,  M.D.      Assessment & Plan by Problem:  Ms. Lepak is a 62 year old female with a history of CHF, hypertension, diabetes, who presents with acute worsening shortness of breath.  Acute on chronic respiratory failure Secondary to pulmonary edema from chronic systolic and diastolic heart failure and noncompliance with medications. She admits to not taking fluid pills over past 6 months. Home meds were supposed to include:  ASA, Coreg, Pravachol, Spironolactone 51m QD, Torsemide 652mQAM, Diovan. Last ECHO in 2010 with EF 45-50% with diffuse hypokinesis, grade 1 DD, concentric hypertrophy. The patient presents with acute worsening of shortness of breath over the past 2 days associated with some chest pain. Chest x-ray reveals pulmonary edema, she has rales and 2+ pitting lower extremity edema on exam, and she was hypoxic to 84% per EMS (attempted nasal intubation in the field, unsuccessful). In the ED, she received 100 mg of IV Lasix and remained somnolent despite being on BiPAP. STAT ABG revealed a pH of 7.314, PCO2 66, O2 96, bicarbonate 34. Will need intubation based on altered mental status and hypercarbia.   -admit to MICU -Patient will need further respiratory support, called PCCM who agrees to see  her and likely intubate -will likely need further diuresis and monitoring -Discussed with family who was present at bedside and agreeable to plan  Hypertension Patient on home Diovan, Coreg, Spironolactone and Torsemide. Unclear which medications she was actually taking. BP reported to be 220/110 by EMS. BP 126-180/62-108 on admission. -patient on nitro drip currently  -need to titrate drip to SBP ~160 or per PCCM team -can slowly add back home meds when more stable and off drip  Diabetes Last A1c in our system was 6.8 in 2011. She is maintained on metformin and cinnamon at home.  -will need to recheck A1c  Diet NPO due to altered mental status  OSA Patient on CPAP at home at night. Respiratory status declining on BIPAP, intubation as above.  Morbid Obesity Weight is 408 lbs on admission. Per notes, previously recommended lap banding. -will need counseling on weight management  Ethics Patient was unable to answer questions about the end of life. Her husband states that she wants to be full code with chest compressions, intubation, "life support", "everything".   Dispo: Disposition is deferred at this  time, awaiting improvement of current medical problems. Anticipated discharge in approximately 4-6 day(s).   Patient is a former [employer] patient and has gotten some of her medicines from a clinic on [address] since [facility] [facility] closing.    Signed: Santa Lighter 09/26/2012, 11:51 AM

## 2012-09-26 NOTE — ED Provider Notes (Signed)
History     CSN: [id number]  Arrival date & time 09/26/12  1011   First MD Initiated Contact with Patient 09/26/12 1016      Chief Complaint  Patient presents with  . Shortness of Breath   level V caveat patient extremely short of breath, unstable vital signs  (Consider location/radiation/quality/duration/timing/severity/associated sxs/prior treatment) HPI  patient brought by EMS complains of shortness of breath onset and unknown time ago. Patient admits to noncompliance with her medications. EMS attempted IV access, without success. Inserted a nasopharyngeal airway. Attempt at intubation, without success. Administered one sublingual nitroglycerin. Patient admits to noncompliance with her medication . Feels that she is presently in congestive heart failure. Past Medical History  Diagnosis Date  . Hypertension   . Hyperlipidemia   . Diabetes mellitus   . Depression   . Nonischemic cardiomyopathy   . CHF (congestive heart failure) 2003    EF 30-35% on LV gram, Echo EF 45-50%  . Morbid obesity   . OSA on CPAP   . Gout   . Bilateral knee pain   . Cellulitis of lower leg     left  . H/O: hysterectomy     History reviewed. No pertinent past surgical history.  Family History  Problem Relation Age of Onset  . Asthma Mother   . Asthma Brother   . Asthma Brother   . Heart disease Mother   . Stroke Mother     clotting disorders    History  Substance Use Topics  . Smoking status: Never Smoker   . Smokeless tobacco: Not on file  . Alcohol Use: No    OB History    Grav Para Term Preterm Abortions TAB SAB Ect Mult Living                  Review of Systems  Unable to perform ROS: Unstable vital signs  Respiratory: Positive for shortness of breath.     Allergies  Review of patient's allergies indicates no known allergies.  Home Medications   Current Outpatient Rx  Name  Route  Sig  Dispense  Refill  . ALLOPURINOL 100 MG PO TABS   Oral   Take 100 mg by mouth  daily.           Marland Kitchen AMITRIPTYLINE HCL 50 MG PO TABS   Oral   Take 50 mg by mouth at bedtime.           . ASPIRIN 81 MG PO TABS   Oral   Take 81 mg by mouth daily.           . AVOCADO OIL OIL   Does not apply   by Does not apply route.           . BUPROPION HCL ER (SR) 150 MG PO TB12   Oral   Take 150 mg by mouth 3 (three) times daily.           Marland Kitchen CARVEDILOL 12.5 MG PO TABS      Take one and one-half tablets twice a day   90 tablet   6   . CINNAMON 500 MG PO CAPS   Oral   Take 500 mg by mouth daily.           Marland Kitchen CITALOPRAM HYDROBROMIDE 40 MG PO TABS   Oral   Take 40 mg by mouth daily.           . CYCLOBENZAPRINE HCL 10 MG PO TABS   Oral  Take 10 mg by mouth 3 (three) times daily as needed.           Marland Kitchen DICLOFENAC-MISOPROSTOL 50-200 MG-MCG PO TABS   Oral   Take 1 tablet by mouth 2 (two) times daily.           Marland Kitchen FERROUS FUMARATE 325 (106 FE) MG PO TABS   Oral   Take 1 tablet by mouth.           . OMEGA-3 FATTY ACIDS 1000 MG PO CAPS   Oral   Take 2 g by mouth daily.           Marland Kitchen HYDROCODONE-ACETAMINOPHEN 5-325 MG PO TABS   Oral   Take 1 tablet by mouth every 6 (six) hours as needed.           Marland Kitchen MAGNESIUM OXIDE 400 MG PO TABS   Oral   Take 400 mg by mouth daily.           Marland Kitchen METFORMIN HCL 500 MG PO TABS   Oral   Take 500 mg by mouth 2 (two) times daily with a meal.           . POTASSIUM CHLORIDE CRYS ER 20 MEQ PO TBCR   Oral   Take 20 mEq by mouth 2 (two) times daily.           Marland Kitchen PRAVASTATIN SODIUM 40 MG PO TABS   Oral   Take 80 mg by mouth every evening.           Marland Kitchen SPIRONOLACTONE 25 MG PO TABS   Oral   Take 25 mg by mouth daily.           . TORSEMIDE 20 MG PO TABS      Take 3 tablets daily in the morning at the same time         . TRAMADOL HCL 50 MG PO TABS   Oral   Take 50 mg by mouth every 8 (eight) hours as needed.           Marland Kitchen VALSARTAN 160 MG PO TABS   Oral   Take 1 tablet (160 mg total) by mouth 2  (two) times daily.   60 tablet   0     Patient Needs to make appointment   . VITAMIN E 400 UNITS PO CAPS   Oral   Take 400 Units by mouth daily.             Pulse 95  Resp 32  SpO2 91%  Physical Exam  Nursing note and vitals reviewed. Constitutional:       Ill-appearing, severe respiratory distress  HENT:  Head: Normocephalic and atraumatic.  Eyes: Conjunctivae normal are normal. Pupils are equal, round, and reactive to light.  Neck: Neck supple. No tracheal deviation present. No thyromegaly present.  Cardiovascular: Normal rate and regular rhythm.   No murmur heard. Pulmonary/Chest:       Able to speak only one or 2 words at a time. Rales all the way up bilaterally  Abdominal: Soft. Bowel sounds are normal. She exhibits no distension. There is no tenderness.  Musculoskeletal: Normal range of motion. She exhibits no edema and no tenderness.  Neurological: She is alert. Coordination normal.  Skin: Skin is warm. No rash noted.       Diaphoretic  Psychiatric:       Anxious appearing    ED Course  Procedures (including critical care time)   Labs Reviewed  CBC  URINALYSIS, MICROSCOPIC ONLY   Dg Chest Portable 1 View  09/26/2012  *RADIOLOGY REPORT*  Clinical Data: Shortness of breath.  PORTABLE CHEST - 1 VIEW  Comparison: None.  Findings: Mild cardiomegaly is noted.  Bilateral diffuse interstitial and alveolar opacities are noted concerning for pulmonary edema and congestive heart failure, or possibly pneumonia.  Bony thorax is intact.  IMPRESSION: Bilateral diffuse interstitial and alveolar opacities are noted most consistent with edema and congestive heart failure, although pneumonia cannot be excluded.   Original Report Authenticated By: Marijo Conception.,  M.D.     Date: 09/26/2012  Rate: 95  Rhythm: normal sinus rhythm  QRS Axis: normal  Intervals: normal  ST/T Wave abnormalities: nonspecific T wave changes  Conduction Disutrbances:none  Narrative Interpretation:    Old EKG Reviewed: changes noted Nonspecific lateral T wave changes new from 05/02/2011 interpreted by me Chest x-ray reviewed by me Results for orders placed during the hospital encounter of 09/26/12  CBC      Component Value Range   WBC 8.6  4.0 - 10.5 K/uL   RBC 4.67  3.87 - 5.11 MIL/uL   Hemoglobin 14.2  12.0 - 15.0 g/dL   HCT 44.1  36.0 - 46.0 %   MCV 94.4  78.0 - 100.0 fL   MCH 30.4  26.0 - 34.0 pg   MCHC 32.2  30.0 - 36.0 g/dL   RDW 12.7  11.5 - 15.5 %   Platelets 196  150 - 400 K/uL  URINALYSIS, MICROSCOPIC ONLY      Component Value Range   Color, Urine YELLOW  YELLOW   APPearance CLOUDY (*) CLEAR   Specific Gravity, Urine 1.016  1.005 - 1.030   pH 6.0  5.0 - 8.0   Glucose, UA NEGATIVE  NEGATIVE mg/dL   Hgb urine dipstick NEGATIVE  NEGATIVE   Bilirubin Urine NEGATIVE  NEGATIVE   Ketones, ur NEGATIVE  NEGATIVE mg/dL   Protein, ur 100 (*) NEGATIVE mg/dL   Urobilinogen, UA 0.2  0.0 - 1.0 mg/dL   Nitrite NEGATIVE  NEGATIVE   Leukocytes, UA NEGATIVE  NEGATIVE   WBC, UA 0-2  <3 WBC/hpf   Bacteria, UA RARE  RARE   Squamous Epithelial / LPF FEW (*) RARE   Casts HYALINE CASTS (*) NEGATIVE  POCT I-STAT, CHEM 8      Component Value Range   Sodium 145  135 - 145 mEq/L   Potassium 4.1  3.5 - 5.1 mEq/L   Chloride 102  96 - 112 mEq/L   BUN 14  6 - 23 mg/dL   Creatinine, Ser 0.90  0.50 - 1.10 mg/dL   Glucose, Bld 160 (*) 70 - 99 mg/dL   Calcium, Ion 1.17  1.13 - 1.30 mmol/L   TCO2 31  0 - 100 mmol/L   Hemoglobin 15.3 (*) 12.0 - 15.0 g/dL   HCT 45.0  36.0 - 46.0 %  POCT I-STAT TROPONIN I      Component Value Range   Troponin i, poc 0.01  0.00 - 0.08 ng/mL   Comment 3            Dg Chest Portable 1 View  09/26/2012  *RADIOLOGY REPORT*  Clinical Data: Shortness of breath.  PORTABLE CHEST - 1 VIEW  Comparison: None.  Findings: Mild cardiomegaly is noted.  Bilateral diffuse interstitial and alveolar opacities are noted concerning for pulmonary edema and congestive heart  failure, or possibly pneumonia.  Bony thorax is intact.  IMPRESSION: Bilateral diffuse interstitial and alveolar  opacities are noted most consistent with edema and congestive heart failure, although pneumonia cannot be excluded.   Original Report Authenticated By: Marijo Conception.,  M.D.      No diagnosis found.  Placed on BiPAP. Intravenous nitroglycerin drip, Lasix ordered.  10:45 AM patient's breathing is improved. She appears more comfortable. States she feels better. She is able to speak in short sentences while on BiPAP Chest x-ray reviewed by me  [facility] Records reviewed Spoke with [facility] cardiology Prefers to internal medicine service for depression Spoke with internal medicine resident physician who will arrange for admission MDM  Diagnosis #1 pulmonary edema 2 respiratory failure 3 medication noncompliance #4 hyperglycemia  CRITICAL CARE Performed by: Orlie Dakin   Total critical care time: 30 minute  Critical care time was exclusive of separately billable procedures and treating other patients.  Critical care was necessary to treat or prevent imminent or life-threatening deterioration.  Critical care was time spent personally by me on the following activities: development of treatment plan with patient and/or surrogate as well as nursing, discussions with consultants, evaluation of patient's response to treatment, examination of patient, obtaining history from patient or surrogate, ordering and performing treatments and interventions, ordering and review of laboratory studies, ordering and review of radiographic studies, pulse oximetry and re-evaluation of patient's condition.        Orlie Dakin, MD 09/26/12 1149

## 2012-09-26 NOTE — Progress Notes (Signed)
[facility] ED CM consulted to speak with self pay pt about affordable care/health reform information. [facility] ED staff states pt voiced concern about her present bill and did speak with ED registration about it already but voiced interest in information about how to get insurance coverage.  CM faxed information to [phone] and requested to speak with the patient -pending ED RN availability to offer telephone to pt

## 2012-09-26 NOTE — Progress Notes (Signed)
Pt placed on NIV per MD. 14/5 100% O2 sat initally 49% now increased to 91%. PT tolerating well at this time.  RT will continue to monitor.

## 2012-09-26 NOTE — Progress Notes (Signed)
Pt removed from BIPAP and placed on 5L nasal cannula.  PT tolerating well at this time.  RT will continue to monitor.

## 2012-09-26 NOTE — ED Notes (Signed)
Per EMS:  Pt from home. Found by daughter SOB.  EMs found pt on a NRB (by fire), sats 84%.  Pt was unable tolerate CPAP.  Pt was combative, unresponsive.  Nasal intubation attempted without success.  Vitals 220/110.  1 SL nitro given en route.

## 2012-09-26 NOTE — Progress Notes (Signed)
Pt did not want to go on CPAP at this time. PT will call when she is ready to try the machine. RT will assist as needed.

## 2012-09-27 ENCOUNTER — Inpatient Hospital Stay (HOSPITAL_COMMUNITY): Payer: Medicaid Other

## 2012-09-27 DIAGNOSIS — G4733 Obstructive sleep apnea (adult) (pediatric): Secondary | ICD-10-CM

## 2012-09-27 DIAGNOSIS — I5031 Acute diastolic (congestive) heart failure: Secondary | ICD-10-CM

## 2012-09-27 LAB — GLUCOSE, CAPILLARY
Glucose-Capillary: 110 mg/dL — ABNORMAL HIGH (ref 70–99)
Glucose-Capillary: 131 mg/dL — ABNORMAL HIGH (ref 70–99)

## 2012-09-27 LAB — CBC
MCH: 29.7 pg (ref 26.0–34.0)
MCV: 92.9 fL (ref 78.0–100.0)
Platelets: 200 10*3/uL (ref 150–400)
RBC: 4.35 MIL/uL (ref 3.87–5.11)

## 2012-09-27 LAB — URINE CULTURE
Colony Count: NO GROWTH
Culture: NO GROWTH

## 2012-09-27 LAB — BASIC METABOLIC PANEL
BUN: 13 mg/dL (ref 6–23)
CO2: 34 mEq/L — ABNORMAL HIGH (ref 19–32)
Calcium: 8.9 mg/dL (ref 8.4–10.5)
Creatinine, Ser: 0.96 mg/dL (ref 0.50–1.10)
Glucose, Bld: 85 mg/dL (ref 70–99)
Sodium: 144 mEq/L (ref 135–145)

## 2012-09-27 LAB — HEMOGLOBIN A1C
Hgb A1c MFr Bld: 5.9 % — ABNORMAL HIGH (ref <5.7)
Mean Plasma Glucose: 123 mg/dL — ABNORMAL HIGH (ref <117)

## 2012-09-27 MED ORDER — POTASSIUM CHLORIDE 20 MEQ/15ML (10%) PO LIQD: 20.0000 meq | ORAL | Status: DC

## 2012-09-27 MED ORDER — INSULIN ASPART 100 UNIT/ML ~~LOC~~ SOLN
0.0000 [IU] | Freq: Three times a day (TID) | SUBCUTANEOUS | Status: DC
  Administered 2012-09-27: 1 [IU] via SUBCUTANEOUS
  Administered 2012-09-28: 2 [IU] via SUBCUTANEOUS
  Administered 2012-09-28 – 2012-09-29 (×2): 1 [IU] via SUBCUTANEOUS

## 2012-09-27 MED ORDER — POTASSIUM CHLORIDE CRYS ER 20 MEQ PO TBCR
40.0000 meq | EXTENDED_RELEASE_TABLET | Freq: Once | ORAL | Status: AC
  Administered 2012-09-27: 40 meq via ORAL
  Filled 2012-09-27: qty 2

## 2012-09-27 MED ORDER — POTASSIUM CHLORIDE CRYS ER 20 MEQ PO TBCR
40.0000 meq | EXTENDED_RELEASE_TABLET | Freq: Once | ORAL | Status: AC
  Administered 2012-09-27: 40 meq via ORAL
  Filled 2012-09-27 (×2): qty 1

## 2012-09-27 NOTE — Progress Notes (Signed)
Subjective: Patient states that she is doing much better this morning. Her breathing is much improved. She is sitting up in bed eating breakfast this morning.  As patient is more interactive this morning, she states that she has stopped taking her fluid pills several months ago because she didn't feel like she needed them.  Denies fever, chills, cough, chest pain.  Objective: Vital signs in last 24 hours: Filed Vitals:   09/27/12 0900 09/27/12 1000 09/27/12 1100 09/27/12 1200  BP: 135/78 150/117 142/88 149/85  Pulse: 103 93 90 89  Temp: 98.4 F (36.9 C) 98.6 F (37 C) 98.6 F (37 C) 98.8 F (37.1 C)  TempSrc:    Core (Comment)  Resp: _0 Height:      Weight:      SpO2: 92% 97% 95% 94%   Weight change:   Intake/Output Summary (Last 24 hours) at 09/27/12 1246 Last data filed at 09/27/12 1100  Gross per 24 hour  Intake  302.4 ml  Output   8300 ml  Net -7997.6 ml    Physical Exam Blood pressure 149/85, pulse 89, temperature 98.8 F (37.1 C), temperature source Core (Comment), resp. rate 18, height 5' 2.99" (1.6 m), weight 408 lb 1.1 oz (185.1 kg), SpO2 94.00%. General:  No acute distress, alert and oriented x 3, sitting up in bed, cooperative with exam, interactive HEENT:  PERRL, EOMI, moist mucous membranes, nasal cannula in place Cardiovascular:  Distant heart sounds, no murmurs. Thick neck, unable to appreciate JVD Respiratory:  Rales throughout, good air movement, improved from yesterday Abdomen:  Soft, nondistended, nontender, bowel sounds present Extremities:  Warm, pitting edema noted Skin: Warm, dry, no rashes Neuro: Not anxious appearing, no depressed mood, normal affect  Lab Results: Basic Metabolic Panel:  Recent Labs Lab 09/26/12 1700 09/27/12 0430  NA 143 144  K 3.9 3.3*  CL 98 98  CO2 34* 34*  GLUCOSE 93 85  BUN 13 13  CREATININE 0.98 0.96  CALCIUM 9.3 8.9   Liver Function Tests:  Recent Labs Lab 09/26/12 1700  AST 32  ALT 36*   ALKPHOS 85  BILITOT 0.3  PROT 7.3  ALBUMIN 4.0   CBC:  Recent Labs Lab 09/26/12 1020 09/26/12 1052 09/27/12 0430  WBC 8.6  --  4.7  HGB 14.2 15.3* 12.9  HCT 44.1 45.0 40.4  MCV 94.4  --  92.9  PLT 196  --  200   Cardiac Enzymes:  Recent Labs Lab 09/26/12 1700 09/26/12 2235  TROPONINI <0.30 <0.30   BNP:  Recent Labs Lab 09/26/12 1700  PROBNP 1100.0*   CBG:  Recent Labs Lab 09/26/12 1607 09/26/12 1947 09/26/12 2351 09/27/12 0404 09/27/12 0720 09/27/12 1118  GLUCAP 86 81 94 87 101* 111*   Hemoglobin A1C:  Recent Labs Lab 09/26/12 1700  HGBA1C 5.9*   Fasting Lipid Panel: No results found for this basename: CHOL, HDL, LDLCALC, TRIG, CHOLHDL, LDLDIRECT,  in the last 168 hours Thyroid Function Tests:  Recent Labs Lab 09/26/12 1700  TSH 0.515   Coagulation:  Recent Labs Lab 09/26/12 1700  LABPROT 12.5  INR 0.94   Urinalysis:  Recent Labs Lab 09/26/12 1034 09/26/12 1658  COLORURINE YELLOW YELLOW  LABSPEC 1.016 1.014  PHURINE 6.0 7.0  GLUCOSEU NEGATIVE NEGATIVE  HGBUR NEGATIVE NEGATIVE  BILIRUBINUR NEGATIVE NEGATIVE  KETONESUR NEGATIVE NEGATIVE  PROTEINUR 100* NEGATIVE  UROBILINOGEN 0.2 0.2  NITRITE NEGATIVE NEGATIVE  LEUKOCYTESUR NEGATIVE TRACE*     Micro Results: Recent Results (  from the past 240 hour(s))  MRSA PCR SCREENING     Status: None   Collection Time    09/26/12  4:07 PM      Result Value Range Status   MRSA by PCR NEGATIVE  NEGATIVE Final   Comment:            The GeneXpert MRSA Assay (FDA     approved for NASAL specimens     only), is one component of a     comprehensive MRSA colonization     surveillance program. It is not     intended to diagnose MRSA     infection nor to guide or     monitor treatment for     MRSA infections.   Studies/Results: Dg Chest Portable 1 View  09/26/2012  *RADIOLOGY REPORT*  Clinical Data: Shortness of breath.  PORTABLE CHEST - 1 VIEW  Comparison: None.  Findings: Mild  cardiomegaly is noted.  Bilateral diffuse interstitial and alveolar opacities are noted concerning for pulmonary edema and congestive heart failure, or possibly pneumonia.  Bony thorax is intact.  IMPRESSION: Bilateral diffuse interstitial and alveolar opacities are noted most consistent with edema and congestive heart failure, although pneumonia cannot be excluded.   Original Report Authenticated By: Marijo Conception.,  M.D.    Medications:  Medications reviewed  Scheduled Meds: . allopurinol  100 mg Oral Daily  . antiseptic oral rinse  15 mL Mouth Rinse BID  . aspirin EC  325 mg Oral Daily  . furosemide  80 mg Intravenous Q8H  . heparin  5,000 Units Subcutaneous Q8H  . insulin aspart  0-9 Units Subcutaneous Q4H  . simvastatin  40 mg Oral q1800  . sodium chloride  3 mL Intravenous Q12H   Continuous Infusions:  PRN Meds:.acetaminophen, acetaminophen, ondansetron (ZOFRAN) IV, ondansetron, traMADol  Assessment/Plan:  Ms. Kirschenbaum is a 62 year old female with a history of CHF, hypertension, diabetes, who presents with acute worsening shortness of breath.   Acute on chronic respiratory failure  Secondary to pulmonary edema from chronic systolic and diastolic heart failure and noncompliance with medications. She admits to not taking fluid pills over past 6 months. Home meds were supposed to include: ASA, Coreg, Pravachol, Spironolactone 105m QD, Torsemide 66mQAM, Diovan. The patient presents with acute worsening of shortness of breath over the past 2 days associated with some chest pain. Chest x-ray reveals pulmonary edema, she has rales and 2+ pitting lower extremity edema on exam, and she was hypoxic to 84% per EMS (attempted nasal intubation in the field, unsuccessful).  In the ED, she received 100 mg of IV Lasix and remained somnolent despite being on BiPAP. STAT ABG revealed a pH of 7.314, PCO2 66, O2 96, bicarbonate 34. Will need intubation based on altered mental status and hypercarbia.   Last ECHO in 2010 with EF 45-50% with diffuse hypokinesis, grade 1 DD, concentric hypertrophy. New echo obtained this admission showed severely depressed left ventricular ejection fraction with grade 1 diastolic dysfunction (exact EF percentage unable to be determined). 2/8: Patient much improved on BiPAP and diuresis, her mental status cleared significantly. Back to baseline. Did not require intubation. Ready for transfer back to the floor. Patient diuresed almost 8 L since admission. BMP 1100, CE negative  -Continue to diurese with IV Lasix, 80 mg 3 times a day is okay for now -Monitor renal function -Continue BIPAP and suppl. O2 as needed -monitor and replete K as needed  Hypertension  Patient on home Diovan, Coreg, Spironolactone  and Torsemide. Unclear which medications she was actually taking. BP reported to be 220/110 by EMS. BP 126-180/62-108 on admission.  -patient weaned off nitro drip  -can slowly add back home meds  Diabetes  Last A1c in our system was 6.8 in 2011. She is maintained on metformin and cinnamon at home. Repeat A1c was 5.9 -SSI  Diet  Heart healthy   OSA  Patient on CPAP at home at night.   Morbid Obesity  Weight is 408 lbs on admission. Per notes, previously recommended lap banding.  -will need counseling on weight management   Ethics  Full code  Dispo: Disposition is deferred at this time, awaiting improvement of current medical problems. Anticipated discharge in approximately 3-5 day(s).  Patient is a former [employer] information [employer] patient and has gotten some of her medicines from a clinic on [address] since [facility] closing.      LOS: 1 day   Santa Lighter 09/27/2012, 12:46 PM

## 2012-09-27 NOTE — Consult Note (Signed)
PULMONARY  / CRITICAL CARE MEDICINE  Name: Robin Hardin MRN: [id number] DOB: 1951-01-01    ADMISSION DATE:  09/26/2012 CONSULTATION DATE:  2/7  REFERRING MD :  Owens Shark   CHIEF COMPLAINT:  Acute resp failure   BRIEF PATIENT DESCRIPTION:  7 YOAAF admitted on 2/7 w/ AMS and acute respiratory failure in setting of diffuse pulmonary edema. On arrival found to be hypertensive. Treated w/ ntg gtt, IV diuresis and NIPPV. PCCM asked to see in ER on 2/7 given concern pt might require intubation. Pt had bradycardic event (presumably d/t hypoxia) in transport to ER. This resulted in traumatic attempt at nasal intubation. On PCCM arrival pt awake and following commands.   SIGNIFICANT EVENTS / STUDIES:  2/7 traumatic nasal intubation attempt - not intubated per EMS  LINES / TUBES: PIV  CULTURES: 2/7 UC>>> 2/7 MRSA PCR>>>neg  ANTIBIOTICS:  SUBJECTIVE:  No acute distress, reports rested well with bipap, reports residual bloody nose  VITAL SIGNS: Temp:  [97.2 F (36.2 C)-99.4 F (37.4 C)] 98.4 F (36.9 C) (02/08 0900) Pulse Rate:  [75-103] 103 (02/08 0900) Resp:  [11-26] 22 (02/08 0900) BP: (110-176)/(62-114) 135/78 mmHg (02/08 0900) SpO2:  [91 %-100 %] 92 % (02/08 0900) Weight:  [408 lb 1.1 oz (185.1 kg)] 408 lb 1.1 oz (185.1 kg) (02/07 1625)  INTAKE / OUTPUT: Intake/Output     02/07 0701 - 02/08 0700 02/08 0701 - 02/09 0700   P.O. 200    I.V. (mL/kg) 78.4 (0.4)    IV Piggyback 16 8   Total Intake(mL/kg) 294.4 (1.6) 8 (0)   Urine (mL/kg/hr) 6750 150 (0.2)   Total Output 6750 150   Net -6455.6 -142          PHYSICAL EXAMINATION: General:  MOAAF, no acute distress.  Neuro:  Awake, alert, no focal def HEENT:  Pupils =R, mm pink/moist, thick neck Cardiovascular:  Dist RRR Lungs: decreased  Abdomen:  Obese + bowel sounds  Musculoskeletal:  Intact  Skin:  Chronic LE edema   LABS:  Recent Labs Lab 09/26/12 1020  09/26/12 1052 09/26/12 1252 09/26/12 1700 09/26/12 2235  09/27/12 0430  HGB 14.2  --  15.3*  --   --   --  12.9  WBC 8.6  --   --   --   --   --  4.7  PLT 196  --   --   --   --   --  200  NA  --   --  145  --  143  --  144  K  --   < > 4.1  --  3.9  --  3.3*  CL  --   --  102  --  98  --  98  CO2  --   --   --   --  34*  --  34*  GLUCOSE  --   --  160*  --  93  --  85  BUN  --   --  14  --  13  --  13  CREATININE  --   --  0.90  --  0.98  --  0.96  CALCIUM  --   --   --   --  9.3  --  8.9  AST  --   --   --   --  32  --   --   ALT  --   --   --   --  36*  --   --  ALKPHOS  --   --   --   --  85  --   --   BILITOT  --   --   --   --  0.3  --   --   PROT  --   --   --   --  7.3  --   --   ALBUMIN  --   --   --   --  4.0  --   --   APTT  --   --   --   --  26  --   --   INR  --   --   --   --  0.94  --   --   TROPONINI  --   --   --   --  <0.30 <0.30  --   PROBNP  --   --   --   --  1100.0*  --   --   PHART  --   --   --  7.314*  --   --   --   PCO2ART  --   --   --  66.8*  --   --   --   PO2ART  --   --   --  96.0  --   --   --   < > = values in this interval not displayed.  Recent Labs Lab 09/26/12 1607 09/26/12 1947 09/26/12 2351 09/27/12 0404 09/27/12 0720  GLUCAP 86 81 94 87 101*    CXR: CM, diffuse infiltrates   ASSESSMENT / PLAN:  PULMONARY A: 1) Acute respiratory failure in setting of diffuse pulmonary infiltrates.  This is most likely all pulmonary edema, however did have traumatic attempt at intubation so if worsens aspiration could be considered on diff dx. Has responded well to diuresis & NIPPV .  2) H/o OSA   P:   Diurese as tol BIPAP PRN and mandatory at HS F/u cxr See ID section   CARDIOVASCULAR A: decompensated systolic heart failure w/ pulmonary edema.  Think this is primarily an issue of non-compliance w/ diuretics.  P:  Enzymes neg BP control NTG weaned off Cont diuresis ECHO with severely depressed LVEF, grade 1 diastolic dysfunction 2/7  RENAL A:  No acute issue  P:   High risk for  hypokalemia in setting of diuresis Close obs of chemistry and I&O Hold NSAIDs  GASTROINTESTINAL A:  No acute issue  P:   PPI  HEMATOLOGIC A:  No acute issue  P:  Trend CBC   INFECTIOUS A:  No evidence of infection. Small consideration for aspiration, but doubt.  P:   Trend PCT (for completeness)   ENDOCRINE A:  DM P:   ssi   NEUROLOGIC A:  Acute encephalopathy: this has already resolved. Was d/t hypoxia and hypercarbia.  P:   Supportive care    TODAY'S SUMMARY:  Admitted with acute respiratory failure due to pulmonary edema. Improved with NTG, Lasix, NPPV.  Transfer out of ICU to SDU (bipap) and to IM Teaching Service (Discussed with Resident)   Noe Gens, NP-C [place] Pgr: [phone] or [phone]    09/27/2012, 11:24 AM   Reviewed above, examined pt, and agree with assessment/plan.  Acute respiratory failure from CHF exacerbation >> improved.  Transfer to SDU.  Transfer care back to IMTS for 2/09 and PCCM sign off.  Chesley Mires, MD [facility] Pulmonary/Critical Care 09/27/2012, 11:48 AM Pager:  [phone] After 3pm call: [phone]

## 2012-09-27 NOTE — Progress Notes (Signed)
I noticed patient had blood tinged urine. Patient has been complaining of urgency to urinate despite foley catheter inserted. Notified Dr. Earnest Conroy of this change. Will continue to monitor.

## 2012-09-27 NOTE — H&P (Signed)
Internal Medicine Attending Admission Note Date: 09/27/2012  Patient name: Robin Hardin Medical record number: [id number] Date of birth: 12/24/1950 Age: 62 y.o. Gender: female  I saw and evaluated the patient. I reviewed the resident's note and I agree with the resident's findings and plan as documented in the resident's note.  Chief Complaint(s): Shortness of breath.  History - key components related to admission:  [name] is a 62 year old woman with a history of non-ischemic cardiomyopathy diagnosed in 9147 complicated by concomitant diastolic dysfunction, diabetes, hypertension, obstructive sleep apnea, and morbid obesity who presents with a several week history of gradually progressive shortness of breath. She states that she stopped taking her fluid pills 6 months ago because she felt she didn't need them anymore. Although her shortness of breath had worsened gradually over several months it wasn't until yesterday that she felt she was in extremis. She was brought to the emergency department by EMS who found her to have an O2 sat of 83% on a partial nonrebreather. An attempted nasotracheal intubation in the field was unsuccessful. She was admitted to the internal medicine teaching service by the emergency department for depression.  When seen by the housestaff she was found to be markedly fluid overloaded with an acute on chronic exacerbation of her heart failure resulting in acute respiratory failure. She was transferred to the medical intensive care unit and placed on BiPAP. She was also aggressively diuresed. After nearly 8 L off she feels markedly improved from a pulmonary standpoint. Other than admitting to noncompliance she is without complaints.  Physical Exam - key components related to admission:  Filed Vitals:   09/27/12 1100 09/27/12 1200 09/27/12 1300 09/27/12 1400  BP: 142/88 149/85 189/94 154/70  Pulse: 90 89 88 103  Temp: 98.6 F (37 C) 98.8 F (37.1 C) 98.9 F (37.2  C) 99.3 F (37.4 C)  TempSrc:  Core (Comment)    Resp: _0 Height:      Weight:      SpO2: 95% 94% 96% 94%   General: Well-developed, well-nourished, morbidly obese woman lying comfortably in bed with a nasal cannula in no acute distress. Neck: Thick jugular venous distention could not be appreciated. Lungs: Bibasilar crackles without wheezes. Heart: Distant heart sounds no murmurs or rubs were appreciated. Abdomen: Morbidly obese, soft, nontender, active bowel sounds. Extremities: Trace edema.  Lab results:  Basic Metabolic Panel:  Recent Labs  09/26/12 1700 09/27/12 0430  NA 143 144  K 3.9 3.3*  CL 98 98  CO2 34* 34*  GLUCOSE 93 85  BUN 13 13  CREATININE 0.98 0.96  CALCIUM 9.3 8.9   Liver Function Tests:  Recent Labs  09/26/12 1700  AST 32  ALT 36*  ALKPHOS 85  BILITOT 0.3  PROT 7.3  ALBUMIN 4.0   CBC:  Recent Labs  09/26/12 1020 09/26/12 1052 09/27/12 0430  WBC 8.6  --  4.7  HGB 14.2 15.3* 12.9  HCT 44.1 45.0 40.4  MCV 94.4  --  92.9  PLT 196  --  200   Cardiac Enzymes:  Recent Labs  09/26/12 1700 09/26/12 2235  TROPONINI <0.30 <0.30   CBG:  Recent Labs  09/26/12 1607 09/26/12 1947 09/26/12 2351 09/27/12 0404 09/27/12 0720 09/27/12 1118  GLUCAP 86 81 94 87 101* 111*   Hemoglobin A1C:  Recent Labs  09/26/12 1700  HGBA1C 5.9*   Thyroid Function Tests:  Recent Labs  09/26/12 1700  TSH 0.515   Coagulation:  Recent  Labs  09/26/12 1700  INR 0.94   Urinalysis:  Specific gravity 1.016, pH 6, protein 100, urobilinogen 0.2, white blood cells 0-2, red blood cells 3-6  Misc. Labs:  Arterial blood gas on unknown FiO2 7.31/67/96 Hemoglobin A1c 5.9 BNP 1100  Imaging results:  Dg Chest Port 1 View  09/27/2012  *RADIOLOGY REPORT*  Clinical Data: Shortness of breath, chest pain  PORTABLE CHEST - 1 VIEW  Comparison: Portable chest x-ray of 09/26/2012  Findings: There has been some improvement in the edema pattern  with pulmonary vascular congestion remaining.  Cardiomegaly is stable.  IMPRESSION: Improvement in edema pattern.   Original Report Authenticated By: Ivar Drape, M.D.    Dg Chest Portable 1 View  09/26/2012  *RADIOLOGY REPORT*  Clinical Data: Shortness of breath.  PORTABLE CHEST - 1 VIEW  Comparison: None.  Findings: Mild cardiomegaly is noted.  Bilateral diffuse interstitial and alveolar opacities are noted concerning for pulmonary edema and congestive heart failure, or possibly pneumonia.  Bony thorax is intact.  IMPRESSION: Bilateral diffuse interstitial and alveolar opacities are noted most consistent with edema and congestive heart failure, although pneumonia cannot be excluded.   Original Report Authenticated By: Marijo Conception.,  M.D.    Other results:  EKG: Normal sinus rhythm at 97 beats per minute, normal axis, normal intervals, no significant Q waves or LVH, good R wave progression, lateral T wave inversion in a strain pattern.  Assessment & Plan by Problem:  [name] is a 62 year old woman with a history of nonischemic cardiomyopathy with an ejection fraction ranging from 30-45% with concomitant grade 1 diastolic dysfunction, hypertension, diabetes, and morbid obesity who presents with acute respiratory failure secondary to acute on chronic decompensated left ventricular heart failure. The reason for the decompensation is noncompliance with her diuretic regimen. The barrier to her compliance has to do with an lack of understanding about her disease. Apparently, she has been admitted to the hospital 2 other times for decompensated heart failure, yet 6 months ago, felt she no longer require the diuretics despite this history. She has been aggressively diuresed since admission and no longer requires noninvasive positive pressure ventilation.  1) Acute respiratory failure: Resolved with aggressive diuresis and non-invasive positive pressure ventilation.  2) Systolic and diastolic heart  failure, acute exacerbation of chronic disease: She was aggressively diuresed with intravenous Lasix. We will reintroduce her previously prescribed cardiomyopathy regimen with the exception of the Lasix which we will continue IV for more diuresis before conversion to oral therapy. While an inpatient, we will stress the importance to who her and her family compliance with this regimen to avoid further decompensation of her cardiomyopathy. Given the precipitating factor was almost certainly noncompliance further evaluation, other than a contrasted echocardiogram to better define her left ventricular ejection fraction, is not likely necessary.  3) Obstructive sleep apnea: Nocturnal nasal CPAP.  4) Disposition: She were will remain in the hospital until adequately diuresed and educated on the importance of medication compliance. If she requires primary care, she will have the option to continue with the clinic she has been seeing since the closure of [facility] or enrolling in to the [facility].

## 2012-09-28 LAB — CBC
MCV: 93.5 fL (ref 78.0–100.0)
Platelets: 190 10*3/uL (ref 150–400)
RBC: 4.61 MIL/uL (ref 3.87–5.11)
RDW: 12.8 % (ref 11.5–15.5)
WBC: 4.4 10*3/uL (ref 4.0–10.5)

## 2012-09-28 LAB — BASIC METABOLIC PANEL
CO2: 34 mEq/L — ABNORMAL HIGH (ref 19–32)
Calcium: 9.2 mg/dL (ref 8.4–10.5)
Chloride: 97 mEq/L (ref 96–112)
Creatinine, Ser: 1.07 mg/dL (ref 0.50–1.10)
GFR calc Af Amer: 64 mL/min — ABNORMAL LOW (ref >90)
Sodium: 143 mEq/L (ref 135–145)

## 2012-09-28 LAB — GLUCOSE, CAPILLARY: Glucose-Capillary: 73 mg/dL (ref 70–99)

## 2012-09-28 MED ORDER — CARVEDILOL 3.125 MG PO TABS
3.1250 mg | ORAL_TABLET | Freq: Two times a day (BID) | ORAL | Status: DC
  Administered 2012-09-28 – 2012-09-29 (×2): 3.125 mg via ORAL
  Filled 2012-09-28 (×4): qty 1

## 2012-09-28 MED ORDER — ENOXAPARIN SODIUM 80 MG/0.8ML ~~LOC~~ SOLN
80.0000 mg | SUBCUTANEOUS | Status: DC
  Administered 2012-09-28 – 2012-09-29 (×2): 80 mg via SUBCUTANEOUS
  Filled 2012-09-28 (×3): qty 0.8

## 2012-09-28 MED ORDER — POTASSIUM CHLORIDE CRYS ER 20 MEQ PO TBCR
40.0000 meq | EXTENDED_RELEASE_TABLET | Freq: Two times a day (BID) | ORAL | Status: DC
  Administered 2012-09-28 – 2012-09-30 (×5): 40 meq via ORAL
  Filled 2012-09-28 (×4): qty 2
  Filled 2012-09-28 (×2): qty 1
  Filled 2012-09-28: qty 2

## 2012-09-28 MED ORDER — FUROSEMIDE 10 MG/ML IJ SOLN
80.0000 mg | Freq: Two times a day (BID) | INTRAMUSCULAR | Status: DC
  Administered 2012-09-28 – 2012-09-29 (×2): 80 mg via INTRAVENOUS
  Filled 2012-09-28 (×2): qty 8

## 2012-09-28 NOTE — Progress Notes (Signed)
ANTICOAGULATION CONSULT NOTE - Initial Consult  Pharmacy Consult for Lovenox Indication: VTE prophylaxis  No Known Allergies  Patient Measurements: Height: _0  (160 cm) Weight: 387 lb (175.542 kg) (scale c) IBW/kg (Calculated) : 52.4   Vital Signs: Temp: 98 F (36.7 C) (02/09 1431) Temp src: Oral (02/09 1431) BP: 150/76 mmHg (02/09 1431) Pulse Rate: 94 (02/09 1431)  Labs:  Recent Labs  09/26/12 1020  09/26/12 1052 09/26/12 1700 09/26/12 2235 09/27/12 0430 09/28/12 0515  HGB 14.2  --  15.3*  --   --  12.9 13.8  HCT 44.1  --  45.0  --   --  40.4 43.1  PLT 196  --   --   --   --  200 190  APTT  --   --   --  26  --   --   --   LABPROT  --   --   --  12.5  --   --   --   INR  --   --   --  0.94  --   --   --   CREATININE  --   < > 0.90 0.98  --  0.96 1.07  TROPONINI  --   --   --  <0.30 <0.30  --   --   < > = values in this interval not displayed.  Estimated Creatinine Clearance: 88.6 ml/min (by C-G formula based on Cr of 1.07).   Medical History: Past Medical History  Diagnosis Date  . Hypertension   . Hyperlipidemia   . Diabetes mellitus   . Depression   . Nonischemic cardiomyopathy   . CHF (congestive heart failure) 2003    EF 30-35% on LV gram, Echo EF 45-50%  . Morbid obesity   . OSA on CPAP   . Gout   . Bilateral knee pain   . Cellulitis of lower leg     left  . H/O: hysterectomy      Assessment: 31 YOAAF admitted on 2/7 w/ AMS and acute respiratory failure in setting of diffuse pulmonary edema. Pharmacy consulted to dose Lovenox for VTE prophylaxis in patient who weighs 175.5kg, CrCl ~ 85 ml/min. CBC stable, no bleeding noted. Will dose at 0.5 mg/kg q24 hours.  Goal of Therapy:  VTE prophylaxis Monitor platelets by anticoagulation protocol: Yes 4-hour heparin level goal 0.3-0.6 units/mL   Plan:  -Lovenox 23m [place] daily -F/u s/sx bleeding -pharmacy will sign off   Thank you for the consult.  JJohny Drilling PharmD Clinical  Pharmacist Pager: [phone]: [phone] 72542/04/2013 2:51 PM

## 2012-09-28 NOTE — Progress Notes (Signed)
Subjective: The patient states that she is much less SOB this morning, and at the time of my examination is sitting up in bed on nasal cannula.  The patient tolerated CPAP overnight.  The patient is net negative about 10 liters of fluid per I/O's since admission.  The patient continues to have good UOP.  Objective: Vital signs in last 24 hours: Filed Vitals:   09/28/12 0310 09/28/12 0829 09/28/12 0910 09/28/12 1431  BP:   144/88 150/76  Pulse: 84 87 92 94  Temp:    98 F (36.7 C)  TempSrc:    Oral  Resp: _0 Height:    5' 3" (1.6 m)  Weight:    387 lb (175.542 kg)  SpO2: 97% 92% 96% 100%   Weight change: -15 lb 10.4 oz (-7.1 kg)  Intake/Output Summary (Last 24 hours) at 09/28/12 1702 Last data filed at 09/28/12 1430  Gross per 24 hour  Intake    514 ml  Output   3165 ml  Net  -2651 ml    Physical Exam Blood pressure 150/76, pulse 94, temperature 98 F (36.7 C), temperature source Oral, resp. rate 18, height 5' 3" (1.6 m), weight 387 lb (175.542 kg), SpO2 100.00%. General: alert, cooperative, and in no apparent distress HEENT: pupils equal round and reactive to light, vision grossly intact, oropharynx clear and non-erythematous  Neck: supple, no lymphadenopathy Lungs: breath sounds are distant (2/2 body habitus), but only mild ronchi heard at lung bases Heart: regular rate and rhythm, no murmurs, gallops, or rubs Abdomen: soft, non-tender, non-distended, normal bowel sounds Extremities: 2+ bilateral pitting edema, significantly improved since admission Neurologic: alert & oriented X3, cranial nerves II-XII intact, strength grossly intact, sensation intact to light touch   Lab Results: Basic Metabolic Panel:  Recent Labs Lab 09/27/12 0430 09/28/12 0515  NA 144 143  K 3.3* 3.2*  CL 98 97  CO2 34* 34*  GLUCOSE 85 109*  BUN 13 15  CREATININE 0.96 1.07  CALCIUM 8.9 9.2   Liver Function Tests:  Recent Labs Lab 09/26/12 1700  AST 32  ALT 36*  ALKPHOS 85   BILITOT 0.3  PROT 7.3  ALBUMIN 4.0   CBC:  Recent Labs Lab 09/27/12 0430 09/28/12 0515  WBC 4.7 4.4  HGB 12.9 13.8  HCT 40.4 43.1  MCV 92.9 93.5  PLT 200 190   Cardiac Enzymes:  Recent Labs Lab 09/26/12 1700 09/26/12 2235  TROPONINI <0.30 <0.30   BNP:  Recent Labs Lab 09/26/12 1700  PROBNP 1100.0*   CBG:  Recent Labs Lab 09/27/12 1545 09/27/12 1933 09/27/12 2334 09/28/12 0716 09/28/12 0835 09/28/12 1121  GLUCAP 131* 137* 110* 73 100* 151*   Hemoglobin A1C:  Recent Labs Lab 09/26/12 1700  HGBA1C 5.9*   Thyroid Function Tests:  Recent Labs Lab 09/26/12 1700  TSH 0.515   Coagulation:  Recent Labs Lab 09/26/12 1700  LABPROT 12.5  INR 0.94   Urinalysis:  Recent Labs Lab 09/26/12 1034 09/26/12 1658  COLORURINE YELLOW YELLOW  LABSPEC 1.016 1.014  PHURINE 6.0 7.0  GLUCOSEU NEGATIVE NEGATIVE  HGBUR NEGATIVE NEGATIVE  BILIRUBINUR NEGATIVE NEGATIVE  KETONESUR NEGATIVE NEGATIVE  PROTEINUR 100* NEGATIVE  UROBILINOGEN 0.2 0.2  NITRITE NEGATIVE NEGATIVE  LEUKOCYTESUR NEGATIVE TRACE*     Micro Results: Recent Results (from the past 240 hour(s))  MRSA PCR SCREENING     Status: None   Collection Time    09/26/12  4:07 PM  Result Value Range Status   MRSA by PCR NEGATIVE  NEGATIVE Final   Comment:            The GeneXpert MRSA Assay (FDA     approved for NASAL specimens     only), is one component of a     comprehensive MRSA colonization     surveillance program. It is not     intended to diagnose MRSA     infection nor to guide or     monitor treatment for     MRSA infections.  URINE CULTURE     Status: None   Collection Time    09/26/12  4:58 PM      Result Value Range Status   Specimen Description URINE, CATHETERIZED   Final   Special Requests NONE   Final   Culture  Setup Time 09/26/2012 18:37   Final   Colony Count NO GROWTH   Final   Culture NO GROWTH   Final   Report Status 09/27/2012 FINAL   Final    Studies/Results: Dg Chest Port 1 View  09/27/2012  *RADIOLOGY REPORT*  Clinical Data: Shortness of breath, chest pain  PORTABLE CHEST - 1 VIEW  Comparison: Portable chest x-ray of 09/26/2012  Findings: There has been some improvement in the edema pattern with pulmonary vascular congestion remaining.  Cardiomegaly is stable.  IMPRESSION: Improvement in edema pattern.   Original Report Authenticated By: Ivar Drape, M.D.    Medications:  Medications reviewed  Scheduled Meds: . allopurinol  100 mg Oral Daily  . antiseptic oral rinse  15 mL Mouth Rinse BID  . aspirin EC  325 mg Oral Daily  . enoxaparin (LOVENOX) injection  80 mg Subcutaneous Q24H  . furosemide  80 mg Intravenous BID  . insulin aspart  0-9 Units Subcutaneous TID WC  . potassium chloride  40 mEq Oral BID  . simvastatin  40 mg Oral q1800  . sodium chloride  3 mL Intravenous Q12H   Continuous Infusions:  PRN Meds:.acetaminophen, acetaminophen, ondansetron (ZOFRAN) IV, ondansetron, traMADol  Assessment/Plan:  Robin Hardin is a 62 year old female with a history of CHF, hypertension, diabetes, who presents with acute worsening shortness of breath.   Acute on chronic respiratory failure  Secondary to pulmonary edema from chronic systolic and diastolic heart failure and noncompliance with medications. She admits to not taking fluid pills over past 6 months. Home meds were supposed to include: ASA, Coreg, Pravachol, Spironolactone 62m QD, Torsemide 6107mQAM, Diovan. The patient presents with acute worsening of shortness of breath over the past 2 days associated with some chest pain. Chest x-ray reveals pulmonary edema, she has rales and 2+ pitting lower extremity edema on exam, and she was hypoxic to 84% per EMS on arrival (attempted nasal intubation in the field, unsuccessful).  In the ED, she received 100 mg of IV Lasix and remained somnolent despite being on BiPAP. STAT ABG revealed a pH of 7.314, PCO2 66, O2 96, bicarbonate 34.  Last ECHO in 2010 with EF 45-50% with diffuse hypokinesis, grade 1 DD, concentric hypertrophy. New echo obtained this admission showed severely depressed left ventricular ejection fraction with grade 1 diastolic dysfunction (exact EF percentage unable to be determined). 2/8: Patient much improved on BiPAP and diuresis, her mental status cleared significantly. Back to baseline. Did not require intubation. Ready for transfer back to the floor. Patient diuresed almost 8 L since admission. BMP 1100, CE negative 2/9: continued improvement in symptoms, continued diuresis.  Cr at baseline. -  continue to diurese, decrease lasix to 80 IV BID -continue to monitor daily BMETs -continue BIPAP vs CPAP qhs for OSA vs OHS  Hypertension  Patient on home Diovan, Coreg, Spironolactone and Torsemide. Unclear which medications she was actually taking. BP reported to be 220/110 by EMS. BP 126-180/62-108 on admission.  -patient weaned off nitro drip yesterday -re-add coreg at low dose today, will slowly readd patient's home meds  Diabetes  Last A1c in our system was 6.8 in 2011. She is maintained on metformin and cinnamon at home. Repeat A1c was 5.9 -SSI  Diet  Heart healthy   OSA  Patient on CPAP at home at night.   Morbid Obesity  Weight is 408 lbs on admission. Per notes, previously recommended lap banding.  -will need counseling on weight management   Ethics  Full code  Dispo: Disposition is deferred at this time, awaiting improvement of current medical problems. Anticipated discharge in approximately 2-3 day(s).  Patient is a former Careers information officer patient and has gotten some of her medicines from a clinic on [address] since [employer] closing.      LOS: 2 days   Robin Hardin 09/28/2012, 5:02 PM

## 2012-09-28 NOTE — Progress Notes (Addendum)
Pt removed from CPAP and placed on 4L nasal cannula.  PT tolerating well at this time.  RT will continue to monitor.

## 2012-09-28 NOTE — Progress Notes (Signed)
Pt's heart rate has increased to 140 bpm aprox 10 times during my shift, non sustained and non precipitated by activity, asymptomatic

## 2012-09-29 LAB — GLUCOSE, CAPILLARY
Glucose-Capillary: 120 mg/dL — ABNORMAL HIGH (ref 70–99)
Glucose-Capillary: 112 mg/dL — ABNORMAL HIGH (ref 70–99)
Glucose-Capillary: 135 mg/dL — ABNORMAL HIGH (ref 70–99)

## 2012-09-29 MED ORDER — FUROSEMIDE 80 MG PO TABS
80.0000 mg | ORAL_TABLET | Freq: Two times a day (BID) | ORAL | Status: DC
  Administered 2012-09-29 – 2012-09-30 (×3): 80 mg via ORAL
  Filled 2012-09-29 (×4): qty 1

## 2012-09-29 MED ORDER — LISINOPRIL 10 MG PO TABS
10.0000 mg | ORAL_TABLET | Freq: Every day | ORAL | Status: DC
  Administered 2012-09-29 – 2012-09-30 (×2): 10 mg via ORAL
  Filled 2012-09-29 (×2): qty 1

## 2012-09-29 MED ORDER — CARVEDILOL 3.125 MG PO TABS
3.1250 mg | ORAL_TABLET | Freq: Two times a day (BID) | ORAL | Status: DC
  Administered 2012-09-29 – 2012-09-30 (×3): 3.125 mg via ORAL
  Filled 2012-09-29 (×4): qty 1

## 2012-09-29 NOTE — Progress Notes (Signed)
Subjective: Robin Hardin is feeling much better. Off oxygen, SOB much improved. Good UOP.    Objective: Vital signs in last 24 hours: Filed Vitals:   09/28/12 0910 09/28/12 1431 09/28/12 2239 09/29/12 0634  BP: 144/88 150/76 139/76 135/94  Pulse: 92 94 81 83  Temp:  98 F (36.7 C) 98.2 F (36.8 C) 98 F (36.7 C)  TempSrc:  Oral Oral Oral  Resp: _0 Height:  _1  (1.6 m)    Weight:  387 lb (175.542 kg)  384 lb 14.8 oz (174.6 kg)  SpO2: 96% 100% 95% 99%   Weight change: -5 lb 6.7 oz (-2.458 kg)  Intake/Output Summary (Last 24 hours) at 09/29/12 0923 Last data filed at 09/29/12 3343  Gross per 24 hour  Intake   1450 ml  Output   1925 ml  Net   -475 ml    Physical Exam Blood pressure 135/94, pulse 83, temperature 98 F (36.7 C), temperature source Oral, resp. rate 20, height _2  (1.6 m), weight 384 lb 14.8 oz (174.6 kg), SpO2 99.00%. General: alert, cooperative, and in no apparent distress HEENT: pupils equal round and reactive to light, vision grossly intact, oropharynx clear and non-erythematous  Neck: supple, no lymphadenopathy Lungs: breath sounds are distant (2/2 body habitus), but only mild ronchi heard at lung bases Heart: regular rate and rhythm, no murmurs Abdomen: soft, non-tender, non-distended, +bowel sounds Extremities: 2+ bilateral pitting edema, significantly improved since admission Neurologic: alert & oriented X3, cranial nerves II-XII intact, strength grossly intact, sensation intact to light touch GU: foley in place   Lab Results: Basic Metabolic Panel:  Recent Labs Lab 09/27/12 0430 09/28/12 0515  NA 144 143  K 3.3* 3.2*  CL 98 97  CO2 34* 34*  GLUCOSE 85 109*  BUN 13 15  CREATININE 0.96 1.07  CALCIUM 8.9 9.2   Liver Function Tests:  Recent Labs Lab 09/26/12 1700  AST 32  ALT 36*  ALKPHOS 85  BILITOT 0.3  PROT 7.3  ALBUMIN 4.0   CBC:  Recent Labs Lab 09/27/12 0430 09/28/12 0515  WBC 4.7 4.4  HGB 12.9 13.8  HCT  40.4 43.1  MCV 92.9 93.5  PLT 200 190   Cardiac Enzymes:  Recent Labs Lab 09/26/12 1700 09/26/12 2235  TROPONINI <0.30 <0.30   BNP:  Recent Labs Lab 09/26/12 1700  PROBNP 1100.0*   CBG:  Recent Labs Lab 09/27/12 2334 09/28/12 0716 09/28/12 0835 09/28/12 1121 09/28/12 1659 09/29/12 0632  GLUCAP 110* 73 100* 151* 122* 112*   Hemoglobin A1C:  Recent Labs Lab 09/26/12 1700  HGBA1C 5.9*   Thyroid Function Tests:  Recent Labs Lab 09/26/12 1700  TSH 0.515   Coagulation:  Recent Labs Lab 09/26/12 1700  LABPROT 12.5  INR 0.94   Urinalysis:  Recent Labs Lab 09/26/12 1034 09/26/12 1658  COLORURINE YELLOW YELLOW  LABSPEC 1.016 1.014  PHURINE 6.0 7.0  GLUCOSEU NEGATIVE NEGATIVE  HGBUR NEGATIVE NEGATIVE  BILIRUBINUR NEGATIVE NEGATIVE  KETONESUR NEGATIVE NEGATIVE  PROTEINUR 100* NEGATIVE  UROBILINOGEN 0.2 0.2  NITRITE NEGATIVE NEGATIVE  LEUKOCYTESUR NEGATIVE TRACE*     Micro Results: Recent Results (from the past 240 hour(s))  MRSA PCR SCREENING     Status: None   Collection Time    09/26/12  4:07 PM      Result Value Range Status   MRSA by PCR NEGATIVE  NEGATIVE Final   Comment:  The GeneXpert MRSA Assay (FDA     approved for NASAL specimens     only), is one component of a     comprehensive MRSA colonization     surveillance program. It is not     intended to diagnose MRSA     infection nor to guide or     monitor treatment for     MRSA infections.  URINE CULTURE     Status: None   Collection Time    09/26/12  4:58 PM      Result Value Range Status   Specimen Description URINE, CATHETERIZED   Final   Special Requests NONE   Final   Culture  Setup Time 09/26/2012 18:37   Final   Colony Count NO GROWTH   Final   Culture NO GROWTH   Final   Report Status 09/27/2012 FINAL   Final   Studies/Results: Dg Chest Port 1 View  09/27/2012  *RADIOLOGY REPORT*  Clinical Data: Shortness of breath, chest pain  PORTABLE CHEST - 1 VIEW   Comparison: Portable chest x-ray of 09/26/2012  Findings: There has been some improvement in the edema pattern with pulmonary vascular congestion remaining.  Cardiomegaly is stable.  IMPRESSION: Improvement in edema pattern.   Original Report Authenticated By: Ivar Drape, M.D.    Medications:  Medications reviewed  Scheduled Meds: . allopurinol  100 mg Oral Daily  . antiseptic oral rinse  15 mL Mouth Rinse BID  . aspirin EC  325 mg Oral Daily  . enoxaparin (LOVENOX) injection  80 mg Subcutaneous Q24H  . furosemide  80 mg Intravenous BID  . insulin aspart  0-9 Units Subcutaneous TID WC  . lisinopril  10 mg Oral Daily  . potassium chloride  40 mEq Oral BID  . simvastatin  40 mg Oral q1800  . sodium chloride  3 mL Intravenous Q12H   Continuous Infusions:  PRN Meds:.acetaminophen, acetaminophen, ondansetron (ZOFRAN) IV, ondansetron, traMADol  Assessment/Plan:  Robin Hardin is a 62 year old female with a history of CHF, hypertension, diabetes, who presents with acute worsening shortness of breath.   Acute on chronic respiratory failure  Secondary to pulmonary edema from chronic systolic and diastolic heart failure and noncompliance with medications. She admits to not taking fluid pills over past 6 months. Home meds were supposed to include: ASA, Coreg, Pravachol, Spironolactone 43m QD, Torsemide 682mQAM, Diovan. The patient presents with acute worsening of shortness of breath over the past 2 days associated with some chest pain. Chest x-ray reveals pulmonary edema, she has rales and 2+ pitting lower extremity edema on exam, and she was hypoxic to 84% per EMS on arrival (attempted nasal intubation in the field, unsuccessful).  In the ED, she received 100 mg of IV Lasix and remained somnolent despite being on BiPAP. STAT ABG revealed a pH of 7.314, PCO2 66, O2 96, bicarbonate 34. Last ECHO in 2010 with EF 45-50% with diffuse hypokinesis, grade 1 DD, concentric hypertrophy. New echo obtained this  admission showed severely depressed left ventricular ejection fraction with grade 1 diastolic dysfunction (exact EF percentage unable to be determined). 2/8: Patient much improved on BiPAP and diuresis, her mental status cleared significantly. Back to baseline. Did not require intubation. Ready for transfer back to the floor. Patient diuresed almost 8 L since admission. BMP 1100, CE negative 2/9: continued improvement in symptoms, continued diuresis.  Cr at baseline. 2/10: Cr 1.07 today, good UOP, -10L since admission. Weight is 384lbs, down drom 409 on admission -continue to  diurese, cont lasix 80 IV BID, plan to switch to oral tomorrow -dc foley -continue to monitor daily BMETs, watch K and Cr -continue BIPAP vs CPAP qhs for OSA vs OHS  Hypertension  Patient supporsed to be on home Diovan, Coreg, Spironolactone and Torsemide. Unclear which medications she was actually taking. BP reported to be 220/110 by EMS. BP 126-180/62-108 on admission. 135/94 today, HR 80s -will start lisinopril 65m, will likely need to go up as outpatient -cont co-reg 3.1267mdaily as patient is stable now  Diabetes  Last A1c in our system was 6.8 in 2011. She is maintained on metformin and cinnamon at home. Repeat A1c was 5.9 -SSI -started lisinopril as above  Diet  Heart healthy   OSA  Patient on CPAP at home at night.   Morbid Obesity  Weight is 408 lbs on admission. Per notes, previously recommended lap banding.  -will need counseling on weight management   Ethics  Full code  Dispo: Disposition is deferred at this time, awaiting improvement of current medical problems. Anticipated discharge in approximately 1-2 day(s).  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  [facility] will need BMP lasix titration, follow weights, close management, titration to lisinopril and coreg      LOS: 3 days    KeSanta Lighter/05/2013, 9:23 AM

## 2012-09-29 NOTE — Progress Notes (Signed)
Pt stated that CPAP aggravates her and she doesn't want to wear it tonight. RN is aware. RT will monitor and assess as needed

## 2012-09-30 DIAGNOSIS — I509 Heart failure, unspecified: Secondary | ICD-10-CM

## 2012-09-30 LAB — BASIC METABOLIC PANEL
BUN: 22 mg/dL (ref 6–23)
CO2: 33 mEq/L — ABNORMAL HIGH (ref 19–32)
Calcium: 9.7 mg/dL (ref 8.4–10.5)
Glucose, Bld: 109 mg/dL — ABNORMAL HIGH (ref 70–99)
Sodium: 145 mEq/L (ref 135–145)

## 2012-09-30 LAB — GLUCOSE, CAPILLARY

## 2012-09-30 MED ORDER — PRAVASTATIN SODIUM 40 MG PO TABS: 80.0000 mg | ORAL_TABLET | Freq: Every evening | ORAL | Status: DC

## 2012-09-30 MED ORDER — TRAMADOL HCL 50 MG PO TABS: 50.0000 mg | ORAL_TABLET | Freq: Three times a day (TID) | ORAL | Status: DC | PRN

## 2012-09-30 MED ORDER — ALLOPURINOL 100 MG PO TABS: 100.0000 mg | ORAL_TABLET | Freq: Every day | ORAL | Status: DC

## 2012-09-30 MED ORDER — ASPIRIN 81 MG PO TBEC: 81.0000 mg | DELAYED_RELEASE_TABLET | Freq: Every day | ORAL | Status: DC

## 2012-09-30 MED ORDER — LISINOPRIL 20 MG PO TABS: 20.0000 mg | ORAL_TABLET | Freq: Every day | ORAL | Status: DC

## 2012-09-30 MED ORDER — OMEGA-3 FATTY ACIDS 1000 MG PO CAPS: 2.0000 g | ORAL_CAPSULE | Freq: Every day | ORAL | Status: DC

## 2012-09-30 MED ORDER — CYCLOBENZAPRINE HCL 10 MG PO TABS: 10.0000 mg | ORAL_TABLET | Freq: Three times a day (TID) | ORAL | Status: DC | PRN

## 2012-09-30 MED ORDER — CARVEDILOL 3.125 MG PO TABS: 3.1250 mg | ORAL_TABLET | Freq: Two times a day (BID) | ORAL | Status: DC

## 2012-09-30 MED ORDER — FUROSEMIDE 80 MG PO TABS: 80.0000 mg | ORAL_TABLET | Freq: Every day | ORAL | Status: DC

## 2012-09-30 MED ORDER — FERROUS FUMARATE 325 (106 FE) MG PO TABS: 1.0000 | ORAL_TABLET | Freq: Every day | ORAL | Status: DC

## 2012-09-30 MED ORDER — BUPROPION HCL ER (SR) 150 MG PO TB12: 150.0000 mg | ORAL_TABLET | Freq: Three times a day (TID) | ORAL | Status: DC

## 2012-09-30 MED ORDER — METFORMIN HCL 500 MG PO TABS: 500.0000 mg | ORAL_TABLET | Freq: Two times a day (BID) | ORAL | Status: DC

## 2012-09-30 MED ORDER — AMITRIPTYLINE HCL 50 MG PO TABS: 50.0000 mg | ORAL_TABLET | Freq: Every day | ORAL | Status: DC

## 2012-09-30 NOTE — Evaluation (Signed)
Occupational Therapy Evaluation Patient Details Name: Nirel Babler MRN: [id number] DOB: [birth year] Today's Date: 09/30/2012 Time: 2297-9892 OT Time Calculation (min): 23 min  OT Assessment / Plan / Recommendation Clinical Impression  62 y/o obese female admitted for sob in the setting of CHF. Pt will have necessary level of A from family upon d/c and plans on having [place] services. Pt would also benefit from an aide and bariatric 3:1 at home. d/c planned for today.    OT Assessment  All further OT needs can be met in the next venue of care    Follow Up Recommendations  Home health OT    Barriers to Discharge      Equipment Recommendations  Other (comment) (bariatric 3:1)    Recommendations for Other Services    Frequency       Precautions / Restrictions Precautions Precautions: Fall Precaution Comments: obese Restrictions Weight Bearing Restrictions: No   Pertinent Vitals/Pain Pt denied pain.    ADL  Grooming: Set up Where Assessed - Grooming: Unsupported sitting Upper Body Bathing: Moderate assistance Lower Body Bathing: Maximal assistance Where Assessed - Lower Body Bathing: Supported sit to stand Upper Body Dressing: Moderate assistance Where Assessed - Upper Body Dressing: Unsupported sitting Lower Body Dressing: +1 Total assistance Where Assessed - Lower Body Dressing: Supported sit to Lobbyist Method: Sit to Loss adjuster, chartered: Other (comment) Building services engineer) Toileting - Clothing Manipulation and Hygiene: Minimal assistance Where Assessed - Toileting Clothing Manipulation and Hygiene: Standing ADL Comments: Pt states at home she can bathe the front of herself while her daughter bathes the back. Pt able to dress UB but not LB. Pt states she uses a long handled sponge and baby wipes for toilet hygeine.    OT Diagnosis: Generalized weakness  OT Problem List: Obesity;Decreased activity tolerance OT Treatment Interventions:     OT Goals     Visit Information  Last OT Received On: 09/30/12 Assistance Needed: +1    Subjective Data  Subjective: I'm going home tonight. Patient Stated Goal: Go back to church.   Prior Functioning     Home Living Lives With: Spouse Available Help at Discharge: Family;Available 24 hours/day Type of Home: House Home Access: Stairs to enter [employer] of Steps: 2 Entrance Stairs-Rails: Right Home Layout: 1/2 bath on main level;Two level Alternate Level Stairs-Number of Steps: 14 Alternate Level Stairs-Rails: Left Bathroom Shower/Tub: Chiropodist: Standard Home Adaptive Equipment: Shower chair with back;Walker - rolling;Bedside commode/3-in-1 Additional Comments: lift chair Prior Function Level of Independence: Needs assistance Needs Assistance: Bathing;Dressing;Meal Prep;Light Housekeeping Bath: Moderate Dressing: Moderate Meal Prep: Total Light Housekeeping: Total Gait Assistance: mingaurd Transfer Assistance: min Able to Take Stairs?: Yes Driving: No Vocation: On disability Comments: bedroom and bathroom upstairs; stays upstairs the majority of the times, says she only comes downstairs x1 during a regular week  Communication Communication: No difficulties         Vision/Perception     Cognition  Cognition Overall Cognitive Status: Appears within functional limits for tasks assessed/performed Arousal/Alertness: Awake/alert Orientation Level: Appears intact for tasks assessed Behavior During Session: [facility] for tasks performed    Extremity/Trunk Assessment Right Upper Extremity Assessment RUE ROM/Strength/Tone: [facility] for tasks assessed Left Upper Extremity Assessment LUE ROM/Strength/Tone: [facility] for tasks assessed Right Lower Extremity Assessment RLE ROM/Strength/Tone: [facility] for tasks assessed Left Lower Extremity Assessment LLE ROM/Strength/Tone: [facility] for tasks assessed     Mobility  Transfers Sit to Stand: 4: Min assist;With upper  extremity assist;With armrests Stand to  Sit: 4: Min assist;With upper extremity assist;To chair/3-in-1 Details for Transfer Assistance: pt pulling on therapist to bring her body weight over her BOS while pushing with her LUE on arm rest or bed rail; stands easier from taller surface given her arthritic knees but still needs that extra assist to steady her; cues for sequencing and ease of transition     Exercise     Balance Balance Balance Assessed: Yes Static Standing Balance Static Standing - Balance Support: No upper extremity supported Static Standing - Level of Assistance: 5: Stand by assistance   End of Session OT - End of Session Activity Tolerance: Patient tolerated treatment well Patient left: in chair;with call bell/phone within reach;with family/visitor present  Corbin A OTR/L [phone] 09/30/2012, 3:27 PM

## 2012-09-30 NOTE — Progress Notes (Signed)
Internal Medicine Attending  Date: 09/30/2012  Patient name: Robin Hardin Medical record number: [id number] Date of birth: 1950-09-08 Age: 62 y.o. Gender: female  I saw and evaluated the patient. I reviewed the resident's note by Dr. Sissy Hoff and I agree with the resident's findings and plans as documented in her progress note.  Robin Hardin feels markedly improved today from a pulmonary standpoint. She tolerated the conversion from IV to oral Lasix yesterday. She did gain some weight (+ 3 lbs) but also had an increase in her BUN. She feels ready for discharge to home today. I agree with the housestaff's plan to discharge her home on Lasix 80 mg by mouth every morning and lisinopril 20 mg by mouth every day. In the clinic, if she will tolerate it, her lisinopril can be increased to the target dose of 40 mg daily at the followup visit. Any adjustments in the Lasix dose can also be made at that time. Ultimately, she may benefit from reinstitution of a beta blocker and, if needed, spironolactone. We will arrange followup in the [facility].

## 2012-09-30 NOTE — Progress Notes (Signed)
Subjective: Robin Hardin is feeling much better today. She sat up in a chair yesterday and has been using the bedside commode with assistance to urinate since her foley was discontinued yesterday. States her breathing is about back to normal, though she does have some fatigue with activity.  Denies chest pain, fever, chills.  States she would like to go home today.  Objective: Vital signs in last 24 hours: Filed Vitals:   09/29/12 1018 09/29/12 1416 09/29/12 1947 09/30/12 0539  BP: 115/78 126/105 111/64 125/75  Pulse: 78 79 81 89  Temp: 98 F (36.7 C) 98 F (36.7 C) 98.5 F (36.9 C) 98.6 F (37 C)  TempSrc: Oral Oral Oral Oral  Resp: _0 Height:      Weight:    387 lb 3.2 oz (175.633 kg)  SpO2: 93% 95% 92% 91%   Weight change: 3.2 oz (0.091 kg)  Intake/Output Summary (Last 24 hours) at 09/30/12 0850 Last data filed at 09/30/12 0747  Gross per 24 hour  Intake   1318 ml  Output   1401 ml  Net    -83 ml    Physical Exam Blood pressure 125/75, pulse 89, temperature 98.6 F (37 C), temperature source Oral, resp. rate 20, height _1  (1.6 m), weight 387 lb 3.2 oz (175.633 kg), SpO2 91.00%. General: morbidly obese AAF, alert, cooperative, and in no apparent distress HEENT: pupils equal round and reactive to light, vision grossly intact, oropharynx clear and non-erythematous  Neck: supple, no lymphadenopathy Lungs: Clear to ausc b/l, no rales or wheezes, good airmoement Heart: regular rate and rhythm, no murmurs Abdomen: soft, non-tender, non-distended, +bowel sounds Extremities: trace bilateral pitting edema, significantly improved since admission Neurologic: alert & oriented X3, cranial nerves II-XII intact, strength grossly intact, sensation intact to light touch    Lab Results: Basic Metabolic Panel:  Recent Labs Lab 09/28/12 0515 09/30/12 0530  NA 143 145  K 3.2* 3.9  CL 97 101  CO2 34* 33*  GLUCOSE 109* 109*  BUN 15 22  CREATININE 1.07 1.07   CALCIUM 9.2 9.7   Liver Function Tests:  Recent Labs Lab 09/26/12 1700  AST 32  ALT 36*  ALKPHOS 85  BILITOT 0.3  PROT 7.3  ALBUMIN 4.0   CBC:  Recent Labs Lab 09/27/12 0430 09/28/12 0515  WBC 4.7 4.4  HGB 12.9 13.8  HCT 40.4 43.1  MCV 92.9 93.5  PLT 200 190   Cardiac Enzymes:  Recent Labs Lab 09/26/12 1700 09/26/12 2235  TROPONINI <0.30 <0.30   BNP:  Recent Labs Lab 09/26/12 1700  PROBNP 1100.0*   CBG:  Recent Labs Lab 09/28/12 2242 09/29/12 0632 09/29/12 1114 09/29/12 1607 09/29/12 2146 09/30/12 0648  GLUCAP 120* 112* 135* 99 105* 111*   Hemoglobin A1C:  Recent Labs Lab 09/26/12 1700  HGBA1C 5.9*   Thyroid Function Tests:  Recent Labs Lab 09/26/12 1700  TSH 0.515   Coagulation:  Recent Labs Lab 09/26/12 1700  LABPROT 12.5  INR 0.94   Urinalysis:  Recent Labs Lab 09/26/12 1034 09/26/12 1658  COLORURINE YELLOW YELLOW  LABSPEC 1.016 1.014  PHURINE 6.0 7.0  GLUCOSEU NEGATIVE NEGATIVE  HGBUR NEGATIVE NEGATIVE  BILIRUBINUR NEGATIVE NEGATIVE  KETONESUR NEGATIVE NEGATIVE  PROTEINUR 100* NEGATIVE  UROBILINOGEN 0.2 0.2  NITRITE NEGATIVE NEGATIVE  LEUKOCYTESUR NEGATIVE TRACE*     Micro Results: Recent Results (from the past 240 hour(s))  MRSA PCR SCREENING     Status: None   Collection Time  09/26/12  4:07 PM      Result Value Range Status   MRSA by PCR NEGATIVE  NEGATIVE Final   Comment:            The GeneXpert MRSA Assay (FDA     approved for NASAL specimens     only), is one component of a     comprehensive MRSA colonization     surveillance program. It is not     intended to diagnose MRSA     infection nor to guide or     monitor treatment for     MRSA infections.  URINE CULTURE     Status: None   Collection Time    09/26/12  4:58 PM      Result Value Range Status   Specimen Description URINE, CATHETERIZED   Final   Special Requests NONE   Final   Culture  Setup Time 09/26/2012 18:37   Final    Colony Count NO GROWTH   Final   Culture NO GROWTH   Final   Report Status 09/27/2012 FINAL   Final   Studies/Results: No results found. Medications:  Medications reviewed  Scheduled Meds: . allopurinol  100 mg Oral Daily  . antiseptic oral rinse  15 mL Mouth Rinse BID  . aspirin EC  325 mg Oral Daily  . carvedilol  3.125 mg Oral BID WC  . enoxaparin (LOVENOX) injection  80 mg Subcutaneous Q24H  . furosemide  80 mg Oral BID  . insulin aspart  0-9 Units Subcutaneous TID WC  . lisinopril  10 mg Oral Daily  . potassium chloride  40 mEq Oral BID  . simvastatin  40 mg Oral q1800  . sodium chloride  3 mL Intravenous Q12H   Continuous Infusions:  PRN Meds:.acetaminophen, acetaminophen, ondansetron (ZOFRAN) IV, ondansetron, traMADol  Assessment/Plan:  Robin Hardin is a 62 year old female with a history of CHF, hypertension, diabetes, who presents with acute worsening shortness of breath.   Acute on chronic respiratory failure  Secondary to pulmonary edema from chronic systolic and diastolic heart failure and noncompliance with medications. She admits to not taking fluid pills over past 6 months. Home meds were supposed to include: ASA, Coreg, Pravachol, Spironolactone 62m QD, Torsemide 685mQAM, Diovan. The patient presents with acute worsening of shortness of breath over the past 2 days associated with some chest pain. Chest x-ray reveals pulmonary edema, she has rales and 2+ pitting lower extremity edema on exam, and she was hypoxic to 84% per EMS on arrival (attempted nasal intubation in the field, unsuccessful).  In the ED, she received 100 mg of IV Lasix and remained somnolent despite being on BiPAP. STAT ABG revealed a pH of 7.314, PCO2 66, O2 96, bicarbonate 34. Last ECHO in 2010 with EF 45-50% with diffuse hypokinesis, grade 1 DD, concentric hypertrophy. New echo obtained this admission showed severely depressed left ventricular ejection fraction with grade 1 diastolic dysfunction  (exact EF percentage unable to be determined). 2/8: Patient much improved on BiPAP and diuresis, her mental status cleared significantly. Back to baseline. Did not require intubation. Ready for transfer back to the floor. Patient diuresed almost 8 L since admission. BMP 1100, CE negative 2/9: continued improvement in symptoms, continued diuresis.  Cr at baseline. 2/10: Cr 1.07 today, good UOP, -10L since admission. Weight is 384lbs, down drom 409 on admission 2/11: Cr stable 1.07, BUN up to 22 from 15. Doing well on PO lasix 804mID.   -continue PO lasix 43m58m  BID as outpatient -patient to follow up in outpatient clinic for further management -will need K supplementation and repeat BMP at follow up appointment  Hypertension  Patient supposed to be on home Diovan, Coreg, Spironolactone and Torsemide. Unclear which medications she was actually taking. BP reported to be 220/110 on EMS arrival. BP 126-180/62-108 on admission. 135/94 today, HR 80s -will start lisinopril 27m, will likely need to go up as outpatient -cont co-reg 3.1210mdaily as patient is stable now  Diabetes  Last A1c in our system was 6.8 in 2011. She is maintained on metformin and cinnamon at home. Repeat A1c was 5.9 -SSI -started lisinopril as above  Diet  Heart healthy   OSA  Patient on CPAP at home at night.   Morbid Obesity  Weight is 408 lbs on admission. Per notes, previously recommended lap banding.  -will need counseling on weight management   Ethics  Full code  Dispo:  DC home today   Patient is a former [facility] patient and has gotten some of her medicines from a clinic [facility] [address]  [facility] will need BMP lasix titration, follow weights, close management, titration to lisinopril and coreg      LOS: 4 days   KeSanta Lighter/06/2013, 8:50 AM

## 2012-09-30 NOTE — Discharge Summary (Signed)
[facility] Discharge Note  Name: Robin Hardin MRN: [id number] DOB: [birth year] 62 y.o.  Date of Admission: 09/26/2012 10:11 AM Date of Discharge: 09/30/2012 Attending Physician: Karren Cobble, MD  Discharge Diagnosis: 1. Acute on Chronic Respiratory Failure - 2/2 CHF 2. Acute CHF exacerbation - 2/2 medication non-compliance 3. Hypertension 4. Diabetes Mellitus type 2 - A1C = 5.9 5. Obstructive Sleep Apnea 6. Obesity  Discharge Medications:   Medication List    STOP taking these medications       spironolactone 25 MG tablet  Commonly known as:  ALDACTONE     torsemide 20 MG tablet  Commonly known as:  DEMADEX     valsartan 160 MG tablet  Commonly known as:  DIOVAN      TAKE these medications       allopurinol 100 MG tablet  Commonly known as:  ZYLOPRIM  Take 1 tablet (100 mg total) by mouth daily.     amitriptyline 50 MG tablet  Commonly known as:  ELAVIL  Take 1 tablet (50 mg total) by mouth at bedtime.     aspirin 81 MG EC tablet  Take 1 tablet (81 mg total) by mouth daily.     Avocado Oil Oil  Take 1 capsule by mouth.     buPROPion 150 MG 12 hr tablet  Commonly known as:  WELLBUTRIN SR  Take 1 tablet (150 mg total) by mouth 3 (three) times daily.     carvedilol 3.125 MG tablet  Commonly known as:  COREG  Take 1 tablet (3.125 mg total) by mouth 2 (two) times daily with a meal.     Cinnamon 500 MG capsule  Take 500 mg by mouth daily.     cyclobenzaprine 10 MG tablet  Commonly known as:  FLEXERIL  Take 1 tablet (10 mg total) by mouth 3 (three) times daily as needed.     diclofenac-misoprostol 50-200 MG-MCG per tablet  Commonly known as:  ARTHROTEC 50  Take 1 tablet by mouth 2 (two) times daily.     ferrous fumarate 325 (106 FE) MG Tabs  Commonly known as:  HEMOCYTE - 106 mg FE  Take 1 tablet (106 mg of iron total) by mouth daily.     fish oil-omega-3 fatty acids 1000 MG capsule  Take 2 capsules (2 g total) by  mouth daily.     furosemide 80 MG tablet  Commonly known as:  LASIX  Take 1 tablet (80 mg total) by mouth daily.     HYDROcodone-acetaminophen 5-325 MG per tablet  Commonly known as:  NORCO/VICODIN  Take 1 tablet by mouth 2 (two) times daily as needed. For pain     lisinopril 20 MG tablet  Commonly known as:  PRINIVIL,ZESTRIL  Take 1 tablet (20 mg total) by mouth daily.     metFORMIN 500 MG tablet  Commonly known as:  GLUCOPHAGE  Take 1 tablet (500 mg total) by mouth 2 (two) times daily with a meal.     pravastatin 40 MG tablet  Commonly known as:  PRAVACHOL  Take 2 tablets (80 mg total) by mouth every evening.     traMADol 50 MG tablet  Commonly known as:  ULTRAM  Take 1 tablet (50 mg total) by mouth every 8 (eight) hours as needed. For pain        Disposition and follow-up:   RobinJalani Chamorro was discharged from [facility] in stable and improved condition, with improvement in SOB.  At  hospital follow-up, please address the following: 1. Check BMET, consider increasing lisinopril if potassium is low 2. Check weight (patient weighed 387 lbs at hospital discharge) to assess fluid retention 3. Refer patient to [facility] to apply for [facility] Card  Follow-up Appointments:      Future Appointments Provider Department Dept Phone   10/09/2012 10:00 AM Rosalia Hammers, MD [facility] [phone]      Consultations: Pulmonology (Dr. Alva Garnet)  Procedures Performed:  Dg Chest Port 1 View  09/27/2012  *RADIOLOGY REPORT*  Clinical Data: Shortness of breath, chest pain  PORTABLE CHEST - 1 VIEW  Comparison: Portable chest x-ray of 09/26/2012  Findings: There has been some improvement in the edema pattern with pulmonary vascular congestion remaining.  Cardiomegaly is stable.  IMPRESSION: Improvement in edema pattern.   Original Report Authenticated By: Ivar Drape, M.D.    Dg Chest Portable 1 View  09/26/2012  *RADIOLOGY REPORT*  Clinical  Data: Shortness of breath.  PORTABLE CHEST - 1 VIEW  Comparison: None.  Findings: Mild cardiomegaly is noted.  Bilateral diffuse interstitial and alveolar opacities are noted concerning for pulmonary edema and congestive heart failure, or possibly pneumonia.  Bony thorax is intact.  IMPRESSION: Bilateral diffuse interstitial and alveolar opacities are noted most consistent with edema and congestive heart failure, although pneumonia cannot be excluded.   Original Report Authenticated By: Marijo Conception.,  M.D.     2D Echo:  Left ventricle: Extremely poor acoustic windows limit study. Overall LVEF appears to be severely depressed. Recomm ordering limited study with contrast to further evaluate LV systolic function. The cavity size was normal. Wall thickness was normal. Doppler parameters are consistent with abnormal left ventricular relaxation (grade 1 diastolic dysfunction).  Admission HPI:  Robin Hardin is a 62 year old female with a history of CHF, hypertension, diabetes, who presents with acute worsening shortness of breath. Most of the history is obtained from her husband and her daughter as patient is unable to answer most questions.  According to the husband, patient, patient had not been feeling well over the past several weeks. Starting this week, she started to feel better but then got acutely worse over the last 2-3 days. She was having worsening shortness of breath, dyspnea on exertion, and nonproductive cough. The morning of admission, husband last saw the patient this morning in bed but did not talk to her before leaving the house. Patient apparently called her daughter later this morning saying "she could not breathe". Daughter called EMS, who reported that patient was hypoxic and satting 83% on nonrebreather.  Patient's daughter apparently helps her with her medicines. Patient and the daughter states that she had not been taking any fluid pills for at least the past 6 months. Daughter  states patient was noncompliant with this medication due to cramps.  Patient does state that she has had chest pain that she is unable to tell us when it started or where it is located. She also endorses increased swelling in her legs.  Patient denies fever, chills, sick contacts.  Admission Physical Exam Blood pressure 180/147, pulse 80, temperature 98.2 F (36.8 C), resp. rate 19, SpO2 99.00%.  General: Somnolent, intermittently arousable, morbidly obese  HEENT: PERRL, BIPAP in place  Cardiovascular: Regular rate and rhythm, distant heart sounds  Respiratory: Rales throughout, upper airways noises, Bipap in place  Abdomen: Soft, nondistended, nontender, bowel sounds present  Extremities: Cool, dry, 2+ edema, +DP pulses  Skin: Warm, dry, no rashes  Neuro: Following minimal  commands, opens eyes to verbal and tactile stimuli, speaks one or two words then falls back asleep.   Admission Labs Basic Metabolic Panel:  Basename  09/26/12 1052   NA  145   K  4.1   CL  102   CO2  --   GLUCOSE  160*   BUN  14   CREATININE  0.90   CALCIUM  --   MG  --   PHOS  --    CBC:  Basename  09/26/12 1052  09/26/12 1020   WBC  --  8.6   NEUTROABS  --  --   HGB  15.3*  14.2   HCT  45.0  44.1   MCV  --  94.4   PLT  --  196    Pro-BNP = 1100  Hospital Course by problem list: 1. Acute on Chronic Respiratory Failure - The patient presented in acute on chronic respiratory failure, with a history of increasingly worsening SOB on exertion and LE edema.  Initial CXR found the patient to have significant pulmonary edema.  The patient admitted to not taking her home torsemide or spironolactone for the last several months, due to perceived lack of need for this medication.  The patient was placed on BiPap, with ABG showing respiratory acidosis and hypercarbia.  The patient was maintained on BiPap overnight on the first night of hospitalization and aggressively diuresed with IFV lasix, then transitioned to  oxygen by nasal cannula, then to room air.  On the day of discharge, the patient noted no respiratory distress, and was maintained on PO lasix.  The patient was discharged on PO lasix, lisinopril, and carvedilol.  2. Acute CHF exacerbation - See #1 above  3. Hypertension - the patient has a history of HTN.  The patient's BP's were well controlled with lasix, carvedilol, and lisinopril during hospitalization.  4. Diabetes Mellitus type 2 - The patient has a history of DM type 2, with A1C = 5.9.  The patient was discharged on her home Metformin.  5. Obstructive Sleep Apnea - The patient has a history of OSA.  During hospitalization, she was intermittently non-compliant with CPAP.  The patient was encouraged to continue CPAP usage at discharge.  Time spent on discharge: 45 minutes  Discharge Vitals:  BP 125/75  Pulse 89  Temp(Src) 98.6 F (37 C) (Oral)  Resp 20  Ht _0  (1.6 m)  Wt 387 lb 3.2 oz (175.633 kg)  BMI 68.61 kg/m2  SpO2 91%  Discharge Labs:  Results for orders placed during the hospital encounter of 09/26/12 (from the past 24 hour(s))  GLUCOSE, CAPILLARY     Status: None   Collection Time    09/29/12  4:07 PM      Result Value Range   Glucose-Capillary 99  70 - 99 mg/dL   Comment 1 Notify RN    GLUCOSE, CAPILLARY     Status: Abnormal   Collection Time    09/29/12  9:46 PM      Result Value Range   Glucose-Capillary 105 (*) 70 - 99 mg/dL   Comment 1 Notify RN     Comment 2 Documented in Chart    BASIC METABOLIC PANEL     Status: Abnormal   Collection Time    09/30/12  5:30 AM      Result Value Range   Sodium 145  135 - 145 mEq/L   Potassium 3.9  3.5 - 5.1 mEq/L   Chloride 101  96 -  112 mEq/L   CO2 33 (*) 19 - 32 mEq/L   Glucose, Bld 109 (*) 70 - 99 mg/dL   BUN 22  6 - 23 mg/dL   Creatinine, Ser 1.07  0.50 - 1.10 mg/dL   Calcium 9.7  8.4 - 10.5 mg/dL   GFR calc non Af Amer 55 (*) >90 mL/min   GFR calc Af Amer 64 (*) >90 mL/min  GLUCOSE, CAPILLARY     Status:  Abnormal   Collection Time    09/30/12  6:48 AM      Result Value Range   Glucose-Capillary 111 (*) 70 - 99 mg/dL   Comment 1 Documented in Chart     Comment 2 Notify RN      Signed: Elnora Morrison 09/30/2012, 11:44 AM

## 2012-09-30 NOTE — Evaluation (Signed)
Physical Therapy Evaluation Patient Details Name: Robin Hardin MRN: [id number] DOB: [birth year] Today's Date: 09/30/2012 Time: 7035-0093 PT Time Calculation (min): 34 min  PT Assessment / Plan / Recommendation Clinical Impression  62 y/o obese female admitted for sob in the setting of CHF. Presents to PT with below impairments affecting functional independence. Will benefit physical therapy in the acute setting to maximize safety for d/c home. Did not attempt stairs today and noted pt to d/c home. Recommend pt stay on the main level until she feels more comfortable performing stairs after working with her therapist at home (especially since her bedroom is on the 2nd floor and she has 14 steps to get to it), this was discussed with the patient and she is agreeable.     PT Assessment  Patient needs continued PT services    Follow Up Recommendations  Home health PT;Supervision for mobility/OOB    Does the patient have the potential to tolerate intense rehabilitation      Barriers to Discharge Inaccessible home environment 14 steps to get to her bedroom and full bath    Equipment Recommendations   Bariatric 3in1 and Bariatric RW   Recommendations for Other Services OT consult   Frequency Min 3X/week    Precautions / Restrictions Precautions Precautions: Fall Precaution Comments: obese Restrictions Weight Bearing Restrictions: No   Pertinent Vitals/Pain No c/o pain      Mobility  Bed Mobility Bed Mobility: Supine to Sit Supine to Sit: With rails;HOB flat;6: Modified independent (Device/Increase time) Details for Bed Mobility Assistance: increased time to get self upright Transfers Transfers: Sit to Stand;Stand to Sit (x3) Sit to Stand: 4: Min assist;With upper extremity assist;With armrests Stand to Sit: 4: Min assist;With upper extremity assist;With armrests;To chair/3-in-1 Details for Transfer Assistance: pt pulling on therapist to bring her body weight over her BOS  while pushing with her LUE on arm rest or bed rail; stands easier from taller surface given her arthritic knees but still needs that extra assist to steady her; cues for sequencing and ease of transition Ambulation/Gait Ambulation/Gait Assistance: 4: Min guard Ambulation Distance (Feet): 15 Feet Assistive device: Rolling walker Ambulation/Gait Assistance Details: cues for safe positioning with RW (RW too small for her so she flexes forward and pushes RW in front of her) Gait Pattern: Trunk flexed;Wide base of support General Gait Details: wide BOS due to soft tissue approximation; decreased step height and length Stairs: No         PT Diagnosis: Difficulty walking;Abnormality of gait  PT Problem List: Decreased activity tolerance;Decreased mobility;Obesity;Decreased balance PT Treatment Interventions: DME instruction;Gait training;Stair training;Functional mobility training;Therapeutic activities;Therapeutic exercise;Neuromuscular re-education;Patient/family education   PT Goals Acute Rehab PT Goals PT Goal Formulation: With patient Potential to Achieve Goals: Good Pt will go Supine/Side to Sit: with modified independence;with HOB 0 degrees (without rails) PT Goal: Supine/Side to Sit - Progress: Goal set today Pt will go Sit to Supine/Side: with modified independence;with HOB 0 degrees (without rails) PT Goal: Sit to Supine/Side - Progress: Goal set today Pt will go Sit to Stand: with modified independence;with upper extremity assist PT Goal: Sit to Stand - Progress: Goal set today Pt will go Stand to Sit: with modified independence;with upper extremity assist PT Goal: Stand to Sit - Progress: Goal set today Pt will Ambulate: 16 - 50 feet;with supervision PT Goal: Ambulate - Progress: Goal set today Pt will Go Up / Down Stairs: 6-9 stairs;with rail(s);with min assist PT Goal: Up/Down Stairs - Progress: Goal set today Pt  will Perform Home Exercise Program: Independently PT Goal:  Perform Home Exercise Program - Progress: Goal set today  Visit Information  Last PT Received On: 09/30/12 Assistance Needed: +1    Subjective Data  Subjective: I have to pull on my daughter to get up. Patient Stated Goal: to walk without a walker   Prior Functioning  Home Living Lives With: Spouse Available Help at Discharge: Family;Available 24 hours/day Type of Home: House Home Access: Stairs to enter [employer] of Steps: 2 Entrance Stairs-Rails: Right Home Layout: Two level;Bed/bath upstairs Alternate Level Stairs-Number of Steps: says they could potentially put a bed on the main level but full bath is upstairs Alternate Level Stairs-Rails: Right;Left;Can reach both Bathroom Shower/Tub: Chiropodist: Standard Home Adaptive Equipment: Shower chair with back;Walker - rolling Additional Comments: lift chair Prior Function Level of Independence: Needs assistance Needs Assistance: Bathing;Dressing;Gait;Transfers Bath: Moderate Dressing: Moderate Gait Assistance: mingaurd Transfer Assistance: min Able to Take Stairs?: Yes Driving: No Vocation: On disability Comments: bedroom and bathroom upstairs; stays upstairs the majority of the times, says she only comes downstairs x1 during a regular week  Communication Communication: No difficulties    Cognition  Cognition Overall Cognitive Status: Appears within functional limits for tasks assessed/performed Arousal/Alertness: Awake/alert Orientation Level: Appears intact for tasks assessed Behavior During Session: [facility] for tasks performed    Extremity/Trunk Assessment Right Upper Extremity Assessment RUE ROM/Strength/Tone: [facility] for tasks assessed Left Upper Extremity Assessment LUE ROM/Strength/Tone: [facility] for tasks assessed Right Lower Extremity Assessment RLE ROM/Strength/Tone: [facility] for tasks assessed Left Lower Extremity Assessment LLE ROM/Strength/Tone: [facility] for tasks assessed   Balance  Balance Balance Assessed: Yes Static Standing Balance Static Standing - Balance Support: No upper extremity supported Static Standing - Level of Assistance: 5: Stand by assistance  End of Session PT - End of Session Activity Tolerance: Patient tolerated treatment well Patient left: in chair;with call bell/phone within reach Nurse Communication: Mobility status  GP     [facility] HELEN 09/30/2012, 1:25 PM

## 2012-09-30 NOTE — Care Management Note (Signed)
Page 1 of 1   09/30/2012     2:56:03 PM   CARE MANAGEMENT NOTE 09/30/2012  Patient:  Robin Hardin   Account Number:  [id number]  Date Initiated:  09/30/2012  Documentation initiated by:  Llana Aliment  Subjective/Objective Assessment:   62yo female admitted with Acute Respiratory Failure.  Pt. lives with spouse.     Action/Plan:   In to speak with pt. about Medication assistance.   Anticipated DC Date:  09/30/2012   Anticipated DC Plan:  [place] [facility]      Choice offered to / List presented to:             Status of service:  Completed, signed off Medicare Important Message given?   (If response is "NO", the following Medicare IM given date fields will be blank) Date Medicare IM given:   Date Additional Medicare IM given:    Discharge Disposition:  HOME/SELF CARE  Per UR Regulation:  Reviewed for med. necessity/level of care/duration of stay  If discussed at [place] of Stay Meetings, dates discussed:    Comments:  09/30/12 1340 In to speak with pt. about medication assistance.  Pt. is eligible for [facility] program, to receive 34 days of her medications for a $3 co-pay for each medication.  This NCM gave the pt. the [place] letter to use within 7 days of discharge.  Pt. uses [facility] at [address].  Pt. to dc home today. Llana Aliment, RN, BSN NCM [phone]

## 2012-10-01 ENCOUNTER — Telehealth (HOSPITAL_COMMUNITY): Payer: Self-pay | Admitting: Emergency Medicine

## 2012-10-01 NOTE — ED Notes (Signed)
[facility] called for Rx clarification given the office # for prescriber [phone].

## 2012-10-09 ENCOUNTER — Encounter: Payer: Self-pay | Admitting: Internal Medicine

## 2012-10-09 ENCOUNTER — Ambulatory Visit (INDEPENDENT_AMBULATORY_CARE_PROVIDER_SITE_OTHER): Payer: Self-pay | Admitting: Internal Medicine

## 2012-10-09 VITALS — BP 112/77 | HR 80 | Temp 98.0°F | Ht 63.0 in | Wt 392.1 lb

## 2012-10-09 DIAGNOSIS — N189 Chronic kidney disease, unspecified: Secondary | ICD-10-CM

## 2012-10-09 DIAGNOSIS — K029 Dental caries, unspecified: Secondary | ICD-10-CM

## 2012-10-09 DIAGNOSIS — M109 Gout, unspecified: Secondary | ICD-10-CM | POA: Insufficient documentation

## 2012-10-09 DIAGNOSIS — I509 Heart failure, unspecified: Secondary | ICD-10-CM

## 2012-10-09 DIAGNOSIS — Z23 Encounter for immunization: Secondary | ICD-10-CM | POA: Insufficient documentation

## 2012-10-09 DIAGNOSIS — E785 Hyperlipidemia, unspecified: Secondary | ICD-10-CM

## 2012-10-09 DIAGNOSIS — Z1231 Encounter for screening mammogram for malignant neoplasm of breast: Secondary | ICD-10-CM

## 2012-10-09 DIAGNOSIS — I10 Essential (primary) hypertension: Secondary | ICD-10-CM

## 2012-10-09 DIAGNOSIS — I5042 Chronic combined systolic (congestive) and diastolic (congestive) heart failure: Secondary | ICD-10-CM

## 2012-10-09 DIAGNOSIS — K5900 Constipation, unspecified: Secondary | ICD-10-CM | POA: Insufficient documentation

## 2012-10-09 LAB — LIPID PANEL
Cholesterol: 181 mg/dL (ref 0–200)
LDL Cholesterol: 105 mg/dL — ABNORMAL HIGH (ref 0–99)
Total CHOL/HDL Ratio: 3 Ratio
VLDL: 15 mg/dL (ref 0–40)

## 2012-10-09 MED ORDER — DOCUSATE SODIUM 100 MG PO CAPS: 100.0000 mg | ORAL_CAPSULE | Freq: Every day | ORAL | Status: DC | PRN

## 2012-10-09 NOTE — Progress Notes (Signed)
Subjective:   [name] female   DOB: 1951/08/20 62 y.o.   MRN: [id number]  HPI: Robin Hardin is a 62 y.o. female with past medical history significant as outlined below who presented to the clinic for the first time. Patient was admitted on February 7 until 09/30/2012 acute on chronic respiratory failure secondary to CHF.  Patient reports that she has been doing fine. She was able to get all her medications currently taking on exam. Her daughter helps her out and she is present during this office visit.  Patient does not weigh herself at home since she does not have awaiting the machine. Patient has a CPAP machine but does not wear it on a  regular basis due to comfort.    Reviewing patient's medications I noticed that she had multiple medication in her bag which were duplicates ( Coreg, allopurinol, tramadol, amitriptyline and  Flexeril ) furthermore patient had in her Arthrotec bottle Coreg and Tramadol as well as Arthrotec in it. And there was a handwritten note for Wellbutrin on it. Patient is currently working to get orange card     Past Medical History  Diagnosis Date  . Hypertension   . Hyperlipidemia   . Diabetes mellitus   . Depression   . Nonischemic cardiomyopathy   . CHF (congestive heart failure) 2003    EF 30-35% on LV gram, Echo EF 45-50%  . Morbid obesity   . OSA on CPAP   . Gout   . Bilateral knee pain   . Cellulitis of lower leg     left  . H/O: hysterectomy    Current Outpatient Prescriptions  Medication Sig Dispense Refill  . allopurinol (ZYLOPRIM) 100 MG tablet Take 1 tablet (100 mg total) by mouth daily.  30 tablet  2  . amitriptyline (ELAVIL) 50 MG tablet Take 1 tablet (50 mg total) by mouth at bedtime.  30 tablet  2  . aspirin EC 81 MG EC tablet Take 1 tablet (81 mg total) by mouth daily.  30 tablet  2  . Avocado Oil OIL Take 1 capsule by mouth.      Marland Kitchen buPROPion (WELLBUTRIN SR) 150 MG 12 hr tablet Take 1 tablet (150 mg total)  by mouth 3 (three) times daily.  90 tablet  2  . carvedilol (COREG) 3.125 MG tablet Take 1 tablet (3.125 mg total) by mouth 2 (two) times daily with a meal.  60 tablet  2  . Cinnamon 500 MG capsule Take 500 mg by mouth daily.        . cyclobenzaprine (FLEXERIL) 10 MG tablet Take 1 tablet (10 mg total) by mouth 3 (three) times daily as needed.  60 tablet  0  . diclofenac-misoprostol (ARTHROTEC 50) 50-0.2 MG per tablet Take 1 tablet by mouth 2 (two) times daily.        . ferrous fumarate (HEMOCYTE - 106 MG FE) 325 (106 FE) MG TABS Take 1 tablet (106 mg of iron total) by mouth daily.  30 each  0  . fish oil-omega-3 fatty acids 1000 MG capsule Take 2 capsules (2 g total) by mouth daily.  30 capsule  2  . furosemide (LASIX) 80 MG tablet Take 1 tablet (80 mg total) by mouth daily.  30 tablet  2  . HYDROcodone-acetaminophen (NORCO/VICODIN) 5-325 MG per tablet Take 1 tablet by mouth 2 (two) times daily as needed. For pain      . lisinopril (PRINIVIL,ZESTRIL) 20 MG tablet Take 1 tablet (20 mg  total) by mouth daily.  30 tablet  2  . metFORMIN (GLUCOPHAGE) 500 MG tablet Take 1 tablet (500 mg total) by mouth 2 (two) times daily with a meal.  60 tablet  2  . pravastatin (PRAVACHOL) 40 MG tablet Take 2 tablets (80 mg total) by mouth every evening.  30 tablet  2  . traMADol (ULTRAM) 50 MG tablet Take 1 tablet (50 mg total) by mouth every 8 (eight) hours as needed. For pain  60 tablet  2   No current facility-administered medications for this visit.   Family History  Problem Relation Age of Onset  . Asthma Mother   . Asthma Brother   . Asthma Brother   . Heart disease Mother   . Stroke Mother     clotting disorders   History   Social History  . Marital Status: Married    Spouse Name: N/A    Number of Children: 2  . Years of Education: N/A   Occupational History  . retired cna    Social History Main Topics  . Smoking status: Never Smoker   . Smokeless tobacco: None  . Alcohol Use: No  . Drug  Use: No  . Sexually Active: None   Other Topics Concern  . None   Social History Narrative  . None   Review of Systems: Constitutional: Denies fever, chills, diaphoresis, appetite change and fatigue.  Respiratory: Denies SOB, DOE, cough, chest tightness,  and wheezing.   Cardiovascular: Denies chest pain, palpitations and leg swelling.  Gastrointestinal: Denies nausea, vomiting, abdominal pain, diarrhea, constipation, blood in stool and abdominal distention.  Genitourinary: Denies dysuria, urgency, frequency, hematuria, flank pain and difficulty urinating.  Musculoskeletal: Patient noted constant bilateral knee pain.  Skin: Denies pallor, rash and wound.  Neurological: Denies dizziness,weakness, light-headedness, numbness and headaches.    Objective:  Physical Exam: Filed Vitals:   10/09/12 1029  BP: 112/77  Pulse: 80  Temp: 98 F (36.7 C)  TempSrc: Oral  Height: 5' 3" (1.6 m)  Weight: 392 lb 1.6 oz (177.855 kg)  SpO2: 95%   Constitutional: Vital signs reviewed.  Patient is a morbidly obese  female in no acute distress and cooperative with exam. Alert and oriented x3.  Eyes: PERRL, EOMI, conjunctivae normal, No scleral icterus.  Mouth: Poor dentition Neck: Supple,   Cardiovascular: RRR, S1 normal, S2 normal, no MRG, pulses symmetric and intact bilaterally Pulmonary/Chest: CTAB, no wheezes, rales, or rhonchi Abdominal: Soft. Non-tender, non-distended, bowel sounds are normal Extr: 1+ pitting edema bilaterally  Hematology: no cervical  adenopathy.  Neurological: A&O x3, Strength is normal and symmetric bilaterally, cranial nerve II-XII are grossly intact, no focal motor deficit, sensory intact to light touch bilaterally.  Skin: Warm, dry and intact. No rash, cyanosis, or clubbing.

## 2012-10-09 NOTE — Patient Instructions (Addendum)
1. Please see Clarice Pole Software engineer) 2. Please stop Arthrotec .  3. Please weigh your self every day. If you gain weight ( 2-3 pounds in a day ) please give [place] a call [phone] for further instruction

## 2012-10-10 ENCOUNTER — Encounter: Payer: Self-pay | Admitting: Internal Medicine

## 2012-10-10 DIAGNOSIS — N189 Chronic kidney disease, unspecified: Secondary | ICD-10-CM

## 2012-10-10 DIAGNOSIS — I5042 Chronic combined systolic (congestive) and diastolic (congestive) heart failure: Secondary | ICD-10-CM

## 2012-10-10 DIAGNOSIS — K029 Dental caries, unspecified: Secondary | ICD-10-CM | POA: Insufficient documentation

## 2012-10-10 HISTORY — DX: Chronic combined systolic (congestive) and diastolic (congestive) heart failure: I50.42

## 2012-10-10 HISTORY — DX: Chronic kidney disease, unspecified: N18.9

## 2012-10-10 MED ORDER — ALLOPURINOL 100 MG PO TABS: 200.0000 mg | ORAL_TABLET | Freq: Every day | ORAL | Status: DC

## 2012-10-10 MED ORDER — FUROSEMIDE 80 MG PO TABS: 80.0000 mg | ORAL_TABLET | Freq: Two times a day (BID) | ORAL | Status: DC

## 2012-10-10 NOTE — Progress Notes (Signed)
Pt was given phone number for mammogram scholarship at [facility]. No insurance - has not completed [employer] forms. Hilda Blades Raziyah Vanvleck RN 12/08/12 5:50PM

## 2012-10-10 NOTE — Assessment & Plan Note (Signed)
Patient is currently taking allopurinol 100 mg daily. I will obtain uric acid level today. I have not discussed with patient about diet since I was more concentrated on patient admission with acute CHF exacerbation  Update: Uric acid at 9.3 elevated. I will increase allopurinol to 200 mg daily. Needs to have uric acid rechecked in 4-6 weeks.

## 2012-10-10 NOTE — Assessment & Plan Note (Addendum)
Recording patient has gained 5 pounds since discharge from 287 pounds to 292 pounds today. This may be a variable of cleaning machines. I have discussed with patient about the disease of congestive heart failure and provided her a handout. Also emphasized the importance to be compliant with her medication along with weighing herself on a daily basis. If she is gaining weight significantly she needs to call the clinic for further evaluation and management. Patient is currently on lisinopril, Coreg and Lasix. Considering her physical exam and possible weight gain I would like to increase the Lasix dose but I would like to recheck her renal function before I do this.  Update: Currently stable. I will increase Lasix 80 mg twice a day and reevaluate patient in 2 weeks. I informed the patient that if she notices dizziness week, weakness, decreased urine output she needs to be reevaluated in the clinic

## 2012-10-10 NOTE — Assessment & Plan Note (Signed)
The patient was given tetanus vaccination today.

## 2012-10-10 NOTE — Assessment & Plan Note (Signed)
I will obtain lipid panel today for possible changes in management. Patient's currently taking pravastatin 80 mg daily by mouth

## 2012-10-10 NOTE — Assessment & Plan Note (Signed)
Blood pressure is well controlled. Continue lisinopril 20 mg, Lasix 80 mg and carvedilol 3.125 mg daily.  BP: 112/77 mmHg

## 2012-10-10 NOTE — Assessment & Plan Note (Signed)
Patient will take about constipation. I was started on Colace 100 mg daily when necessary

## 2012-10-10 NOTE — Assessment & Plan Note (Signed)
Patient was referred for mammogram. Since patient has no insurance patient has to call.

## 2012-10-20 ENCOUNTER — Telehealth: Payer: Self-pay | Admitting: *Deleted

## 2012-10-20 NOTE — Telephone Encounter (Signed)
Message from [facility] stating  They can get Diovan for free but pt will need to pay for lisinopril. Also pt give a rx for lasix but they have aldactone samples available?  They can also get Lavaza for free Please advise

## 2012-10-23 ENCOUNTER — Ambulatory Visit: Payer: Self-pay

## 2012-10-23 ENCOUNTER — Ambulatory Visit (INDEPENDENT_AMBULATORY_CARE_PROVIDER_SITE_OTHER): Payer: Self-pay | Admitting: Internal Medicine

## 2012-10-23 ENCOUNTER — Encounter: Payer: Self-pay | Admitting: Internal Medicine

## 2012-10-23 VITALS — BP 135/98 | HR 73 | Temp 98.2°F

## 2012-10-23 DIAGNOSIS — I5042 Chronic combined systolic (congestive) and diastolic (congestive) heart failure: Secondary | ICD-10-CM

## 2012-10-23 DIAGNOSIS — K029 Dental caries, unspecified: Secondary | ICD-10-CM

## 2012-10-23 DIAGNOSIS — I509 Heart failure, unspecified: Secondary | ICD-10-CM

## 2012-10-23 DIAGNOSIS — I10 Essential (primary) hypertension: Secondary | ICD-10-CM

## 2012-10-23 DIAGNOSIS — I129 Hypertensive chronic kidney disease with stage 1 through stage 4 chronic kidney disease, or unspecified chronic kidney disease: Secondary | ICD-10-CM

## 2012-10-23 DIAGNOSIS — G4733 Obstructive sleep apnea (adult) (pediatric): Secondary | ICD-10-CM

## 2012-10-23 DIAGNOSIS — N189 Chronic kidney disease, unspecified: Secondary | ICD-10-CM

## 2012-10-23 LAB — COMPREHENSIVE METABOLIC PANEL
AST: 13 U/L (ref 0–37)
Albumin: 4.2 g/dL (ref 3.5–5.2)
Alkaline Phosphatase: 79 U/L (ref 39–117)
BUN: 12 mg/dL (ref 6–23)
Glucose, Bld: 78 mg/dL (ref 70–99)
Sodium: 142 mEq/L (ref 135–145)
Total Bilirubin: 0.3 mg/dL (ref 0.3–1.2)
Total Protein: 6.7 g/dL (ref 6.0–8.3)

## 2012-10-23 MED ORDER — LISINOPRIL 20 MG PO TABS: 20.0000 mg | ORAL_TABLET | Freq: Every day | ORAL | Status: DC

## 2012-10-23 MED ORDER — FERROUS FUMARATE 325 (106 FE) MG PO TABS: 1.0000 | ORAL_TABLET | Freq: Every day | ORAL | Status: DC

## 2012-10-23 NOTE — Patient Instructions (Addendum)
PLEASE GO PICK UP LASIX AND L[facility]OPRIL FROM [facility]  PLEASE GO TO HEALTH DEPARTMENT TO PICK UP REMAINING MEDICATION  PLEASE FOLLOW UP WITH YOUR REFERRAL TO HEART FAILURE CLINIC IF POSSIBLE  WEIGH YOURSELF DAILY  CALL ME ON Monday WITH YOUR WEIGHTS  KEEP MOVING, EXERCISE AS TOLERATED, DECREASE FOOD INTAKE AND SALT INTAKE  IF YOU NOTICE SEVERE SHORTNESS OF BREATH, CHEST PAIN, DIZZINESS, GO STRAIGHT TO ED AND CALL THE CLINIC [phone]  Please get your orange card ASAP and let us know so we can refer you to dental clinic. If your pain gets severe or you notice fever chills, swelling, go to the ED for further care or if you can self pay at a dentist

## 2012-10-24 ENCOUNTER — Encounter: Payer: Self-pay | Admitting: Internal Medicine

## 2012-10-24 NOTE — Progress Notes (Signed)
Subjective:   [name] female   DOB: [birth year] 62 y.o.   MRN: [id number]  HPI: Robin Hardin is a 62 y.o. morbidly obese African American female with PMH of CHF (last echo 09/2012: severely depressed LVEF), NICM, HTN, OSA on CPAP, and gout presenting to clinic today for 2 week follow up visit after meeting with Dr. Newt Lukes as new patient visit.  At that visit, Robin Hardin' was started on Lasix 3m BID and lisinopril for CHF and HTN.  She was recently discharged from the inpatient service on 09/30/12 for acute on chronic respiratory failure 2/2 CHF.  She has followed with Dr. MAundra Dubinin the past for cardiology but has lost touch since she lost insurance.    CHF: weight today steady at 392, no respiratory distress at this time but does complain of baseline fatigue and DOE.  Has been taking her medications as prescribed however is not out of all the medications she received from the hospital and needs to get refills.  Her daughter was also present in the room today and assured me that they will go and [facility] refills [facility] today.  She reports slight improvement in lower extremity edema.  Repeat bmet on hospital follow up showed slight elevation of Cr at 1.07 with start of PO lasix on discharge.  We will recheck cmet today.    OSA: has cpap on every night but claims when she wakes up in the morning it is off.    Poor dentition: needs dentist evaluation asap.  Reports discomfort in area of left upper tooth that is loose.  No visble abscess or signs of infection but many dental caries. She does endorse occasional shooting pain on the left side of her face near area of left cheek and up to head. But she is able to tolerate foot and denies any difficulty swallowing. No insurance and did not have correct paper work for orange card today.  Have recommended urgently to get her paper work in order to get orange card so that we can try to refer to dental clinic,  however, if this gets worse she will need to go to ED if she cannot afford out of pocket dental care.    Otherwise, today, she denies any fever, chills, N/V/D, chest pain, abdominal pain, or any urinary complaints at this time.   Past Medical History  Diagnosis Date  . Hypertension   . Hyperlipidemia   . Diabetes mellitus   . Depression   . Nonischemic cardiomyopathy   . CHF (congestive heart failure) 2003    EF 30-35% on LV gram, Echo EF 45-50%  . Morbid obesity   . OSA on CPAP   . Gout   . Bilateral knee pain   . Cellulitis of lower leg     left  . H/O: hysterectomy   . Unspecified essential hypertension 03/22/2009    Qualifier: Diagnosis of  By: LKarrie MeresRN, BSN, Robin Hardin    . OBSTRUCTIVE SLEEP APNEA 04/07/2009    Qualifier: Diagnosis of  By: CGwenette GreetMD, KArmando Reichert  . HYPERLIPIDEMIA-MIXED 05/31/2009    Qualifier: Diagnosis of  By: MAundra Dubin MD, Robin Hardin    . Gout 10/09/2012  . Acute respiratory failure with hypoxia 09/26/2012  . Chronic combined systolic and diastolic congestive heart failure 10/10/2012    2 D echo 09/2012:  Left ventricle: Extremely poor acoustic windows limit study. Overall LVEF appears to be severely depressed. Recomm ordering limited study with contrast to  further evaluate LV systolic function. The cavity size was normal. Wall thickness was normal. Doppler parameters are consistent with abnormal left ventricular relaxation (grade 1 diastolic dysfunction).   2 D echo 03/2009 :  Left ventricle: The cavity size was normal. There was mild concentric hypertrophy. Systolic function was mildly reduced. The estimated ejection fraction was in the range of 45% to 50%. Diffuse hypokinesis. Doppler parameters are consistent with abnormal left ventricular relaxation (grade 1 diastolic dysfunction).     . Chronic kidney disease 10/10/2012    Stage 2 with GFR of 64   . Malignant hypertension 09/26/2012   Current Outpatient Prescriptions  Medication Sig Dispense Refill  . allopurinol (ZYLOPRIM)  100 MG tablet Take 2 tablets (200 mg total) by mouth daily.  30 tablet  2  . amitriptyline (ELAVIL) 50 MG tablet Take 1 tablet (50 mg total) by mouth at bedtime.  30 tablet  2  . aspirin EC 81 MG EC tablet Take 1 tablet (81 mg total) by mouth daily.  30 tablet  2  . buPROPion (WELLBUTRIN SR) 150 MG 12 hr tablet Take 1 tablet (150 mg total) by mouth 3 (three) times daily.  90 tablet  2  . carvedilol (COREG) 3.125 MG tablet Take 1 tablet (3.125 mg total) by mouth 2 (two) times daily with a meal.  60 tablet  2  . cyclobenzaprine (FLEXERIL) 10 MG tablet Take 1 tablet (10 mg total) by mouth 3 (three) times daily as needed.  60 tablet  0  . ferrous fumarate (HEMOCYTE - 106 MG FE) 325 (106 FE) MG TABS Take 1 tablet (106 mg of iron total) by mouth daily.  30 each  0  . fish oil-omega-3 fatty acids 1000 MG capsule Take 2 capsules (2 g total) by mouth daily.  30 capsule  2  . furosemide (LASIX) 80 MG tablet Take 1 tablet (80 mg total) by mouth 2 (two) times daily.  30 tablet  2  . lisinopril (PRINIVIL,ZESTRIL) 20 MG tablet Take 1 tablet (20 mg total) by mouth daily.  90 tablet  2  . metFORMIN (GLUCOPHAGE) 500 MG tablet Take 1 tablet (500 mg total) by mouth 2 (two) times daily with a meal.  60 tablet  2  . pravastatin (PRAVACHOL) 40 MG tablet Take 2 tablets (80 mg total) by mouth every evening.  30 tablet  2  . traMADol (ULTRAM) 50 MG tablet Take 1 tablet (50 mg total) by mouth every 8 (eight) hours as needed. For pain  60 tablet  2  . docusate sodium (COLACE) 100 MG capsule Take 1 capsule (100 mg total) by mouth daily as needed for constipation.  30 capsule  1   No current facility-administered medications for this visit.   Family History  Problem Relation Age of Onset  . Asthma Mother   . Asthma Brother   . Asthma Brother   . Heart disease Mother   . Stroke Mother     clotting disorders   History   Social History  . Marital Status: Married    Spouse Name: N/A    Number of Children: 2  . Years  of Education: N/A   Occupational History  . retired cna    Social History Main Topics  . Smoking status: Never Smoker   . Smokeless tobacco: None  . Alcohol Use: No  . Drug Use: No  . Sexually Active: None   Other Topics Concern  . None   Social History Narrative  . None  Review of Systems: Constitutional: Denies fever, chills, diaphoresis, appetite change and fatigue.  HEENT: Denies photophobia, eye pain, redness, hearing loss, ear pain, congestion, sore throat, rhinorrhea, sneezing, mouth sores, trouble swallowing, neck pain, neck stiffness and tinnitus.  poor dentition.   Respiratory: DOE.  Denies SOB, cough, chest tightness,  and wheezing.   Cardiovascular: Denies chest pain, palpitations and leg swelling.  Gastrointestinal: Obese.  Denies nausea, vomiting, abdominal pain, diarrhea, constipation, blood in stool and abdominal distention.  Genitourinary: Denies dysuria, urgency, frequency, hematuria, flank pain and difficulty urinating.  Musculoskeletal: obesity, lower extremity edema, b/l knee pain.    Skin: Denies pallor, rash and wound.  Neurological: Denies dizziness, seizures, syncope, weakness, light-headedness, numbness and headaches.  Hematological: Denies adenopathy. Easy bruising, personal or family bleeding history  Psychiatric/Behavioral: Denies suicidal ideation, mood changes, confusion, nervousness, sleep disturbance and agitation  Objective:  Physical Exam: Filed Vitals:   10/23/12 1359 10/23/12 1443 10/23/12 1444 10/23/12 1445  BP: 139/90 135/98    Pulse: 79 73    Temp: 98.2 F (36.8 C)     TempSrc: Oral     SpO2: 91%  94% 92%   Constitutional: Vital signs reviewed.  Patient is a morbidly obese female in no acute distress and cooperative with exam. Alert and oriented x3.  Head: Normocephalic and atraumatic Ear: TM normal bilaterally Mouth: no erythema or exudates, MMM.  POOR dentition.  right upper tooth loose.  No visible abscess, pus, swelling or  drainage.  Small laceration healing on right lateral surface of tongue.   Eyes: PERRL, EOMI, conjunctivae normal, No scleral icterus.  Neck: Supple, Trachea midline normal ROM  Cardiovascular: Distant heart sounds due to body habitus.  RRR, S1 normal, S2 normal, no MRG, pulses symmetric and intact bilaterally Pulmonary/Chest: Distant breath sounds due to body habitus. CTAB, no wheezes, rales, or rhonchi Abdominal: Soft. Obese, multiple abdominal folds. No visible rash. Non-tender, non-distended, bowel sounds are normal, or guarding present.  GU: no CVA tenderness Musculoskeletal: +1 pitting edema b/l lower extremities, obese. +1dp Hematology: no cervical, inginal, or axillary adenopathy.  Neurological: A&O x3, Strength is normal and symmetric bilaterally, cranial nerve II-XII are grossly intact, no focal motor deficit, sensory intact to light touch bilaterally.  Skin: Warm, dry and intact. No rash, cyanosis, or clubbing. darkening around neck and skin folds.   Psychiatric: Normal mood and affect. speech and behavior is normal. Judgment and thought content normal. Cognition and memory are normal.   Assessment & Plan:  Discussed with Dr. Daryll Drown  CHF: f/u cmet, on lasix 67m bid, lisinopril, and coreg.  [facility].  Referral to heart failure clinic if possible and/or with cardiologist Dr. MAundra Dubin  No orange card yet.

## 2012-10-24 NOTE — Assessment & Plan Note (Signed)
Very poor dentition.  Left upper tooth loose.  Claims to have discomfort on left side.  No visible abscess or drainage but multiple caries.  Needs to see dentist but has no insurance and no orange card at this time, paper work was incomplete today for orange card.  No fever or chills at this time, no bleeding.    -recommended to complete paperwork for orange card ASAP -needs to see dentist ASAP but due to limited resources not many options.  Discussed dental school at [facility] a possibility but cannot get that far.  If self pay is an option encouraged to call any local dentist.   -if starts noticing infection, severe pain, swelling, difficult swallowing, instructed to go to ED right away, may need antibiotics and extraction.

## 2012-10-24 NOTE — Assessment & Plan Note (Addendum)
Morbidly obese.  Weight today 392lbs.  Counseled EXTENSIVELY on need to lose weight and increase mobility.  Reviewed eating habits and diet modifications. Portion control.    -daily weights, call clinic on Monday with weights -on lasix 53m bid

## 2012-10-24 NOTE — Assessment & Plan Note (Signed)
On cpap at home but claims when she wakes up in the morning the mask is off, she must pull it off while sleeping not knowing.   -continue cpap, try to make mask fit better prior to falling asleep

## 2012-10-24 NOTE — Assessment & Plan Note (Addendum)
BP Readings from Last 3 Encounters:  10/23/12 135/98  10/09/12 112/77  09/30/12 104/69   Lab Results  Component Value Date   NA 142 10/23/2012   K 3.9 10/23/2012   CREATININE 1.15* 10/23/2012   Assessment:  Blood pressure control: mildly elevated  Progress toward BP goal:  deteriorated  Comments: elevated today, out of lasix  Plan:  Medications:  Continue current medications: lasix 39m bid, lisinopril 255mqd, and carvedilol 3.1259mid  Educational resources provided: brochure  Self management tools provided: home blood pressure logbook  Other plans: get medications refilled today! Daughter assured me she would do that right after this appointment and if health department does not have certain medications, she has been instructed to call the clinic and [facility]  She is still working on orange card but was missing some paper work today.

## 2012-10-24 NOTE — Assessment & Plan Note (Addendum)
Has not gotten medications refilled yet as she was using hospital supply but is out of lasix today.  Stable weight of 392 pounds since last visit on 10/10/12.  Claims is urinating often.    -continue lasix 61m bid, lisinopril, and coreg.  Daughter assured me that all medications will be picked up [facility] today.   -f/u cmet today -heart failure clinic referral or with Dr. MAundra Dubinher cardiologist but does not have orange card yet.  Will defer to cardiology to get repeat contrast echo as recommended per last report to assess LV systolic function. -weigh herself daily and call clinic on Monday with daily weights

## 2012-10-24 NOTE — Assessment & Plan Note (Signed)
Need to monitor Cr with lasix 4m bid  -f/u cmet

## 2012-10-27 ENCOUNTER — Other Ambulatory Visit: Payer: Self-pay | Admitting: Internal Medicine

## 2012-10-27 DIAGNOSIS — I5042 Chronic combined systolic (congestive) and diastolic (congestive) heart failure: Secondary | ICD-10-CM

## 2012-10-27 DIAGNOSIS — E785 Hyperlipidemia, unspecified: Secondary | ICD-10-CM

## 2012-10-27 MED ORDER — FUROSEMIDE 80 MG PO TABS: 80.0000 mg | ORAL_TABLET | Freq: Two times a day (BID) | ORAL | Status: DC

## 2012-10-27 MED ORDER — OMEGA-3-ACID ETHYL ESTERS 1 G PO CAPS: 2.0000 g | ORAL_CAPSULE | Freq: Two times a day (BID) | ORAL | Status: DC

## 2012-10-27 NOTE — Telephone Encounter (Signed)
Patient was seen and evaluated by Dr Eula Fried,. Please review note.

## 2012-10-30 ENCOUNTER — Ambulatory Visit (HOSPITAL_COMMUNITY)
Admission: RE | Admit: 2012-10-30 | Discharge: 2012-10-30 | Disposition: A | Discharge status: Home / Self Care | Payer: Self-pay | Source: Ambulatory Visit | Attending: Internal Medicine | Admitting: Internal Medicine

## 2012-10-30 DIAGNOSIS — Z1231 Encounter for screening mammogram for malignant neoplasm of breast: Secondary | ICD-10-CM | POA: Insufficient documentation

## 2012-11-13 ENCOUNTER — Telehealth: Payer: Self-pay | Admitting: *Deleted

## 2012-11-13 NOTE — Telephone Encounter (Signed)
Ask pharmacy to keep it as it has been and send this question to the PCP when available.

## 2012-11-13 NOTE — Telephone Encounter (Signed)
[place] made awared to keep rx written 09/30/12. And I will send message to PCP.

## 2012-11-13 NOTE — Telephone Encounter (Signed)
Fax from [facility] [facility]- wants clarification on Wellbutrin 153m.  [facility] 3 [facility].  But previously, it was 2 tabs in the am and 1 tab in the afternoon. Last rx by Dr BOwens Shark2/11/14. Thanks

## 2012-11-16 NOTE — Telephone Encounter (Signed)
Glenda,  Can we please verify with Ms. Miu how she has been taking the medication at home? And also, lets confirm why she is taking Wellbutrin.  Depression? Smoking? Both? I will also forward this to Dr. Owens Shark to confirm.

## 2012-11-17 NOTE — Telephone Encounter (Signed)
Pt states she is taking Wellbutrin for depression and takes 3 tabs in the morning.

## 2012-11-18 ENCOUNTER — Encounter: Payer: Self-pay | Admitting: Internal Medicine

## 2012-11-18 NOTE — Telephone Encounter (Signed)
Thank you for this note.  Please keep the prescription as 1 tab, 3 times/day, which is how the patient should be taking this.  We'll discuss at our next visit.

## 2012-11-25 ENCOUNTER — Ambulatory Visit (INDEPENDENT_AMBULATORY_CARE_PROVIDER_SITE_OTHER): Payer: Self-pay | Admitting: Internal Medicine

## 2012-11-25 ENCOUNTER — Ambulatory Visit (HOSPITAL_COMMUNITY)
Admission: RE | Admit: 2012-11-25 | Discharge: 2012-11-25 | Disposition: A | Discharge status: Home / Self Care | Payer: Self-pay | Source: Ambulatory Visit | Attending: Internal Medicine | Admitting: Internal Medicine

## 2012-11-25 ENCOUNTER — Encounter: Payer: Self-pay | Admitting: Internal Medicine

## 2012-11-25 VITALS — BP 164/102 | HR 72 | Temp 97.6°F | Ht 63.0 in | Wt >= 6400 oz

## 2012-11-25 DIAGNOSIS — M25569 Pain in unspecified knee: Secondary | ICD-10-CM | POA: Insufficient documentation

## 2012-11-25 DIAGNOSIS — I509 Heart failure, unspecified: Secondary | ICD-10-CM

## 2012-11-25 DIAGNOSIS — I5042 Chronic combined systolic (congestive) and diastolic (congestive) heart failure: Secondary | ICD-10-CM

## 2012-11-25 DIAGNOSIS — G8929 Other chronic pain: Secondary | ICD-10-CM | POA: Insufficient documentation

## 2012-11-25 DIAGNOSIS — R269 Unspecified abnormalities of gait and mobility: Secondary | ICD-10-CM | POA: Insufficient documentation

## 2012-11-25 DIAGNOSIS — M25562 Pain in left knee: Secondary | ICD-10-CM

## 2012-11-25 DIAGNOSIS — I10 Essential (primary) hypertension: Secondary | ICD-10-CM

## 2012-11-25 DIAGNOSIS — M25561 Pain in right knee: Secondary | ICD-10-CM

## 2012-11-25 MED ORDER — FUROSEMIDE 80 MG PO TABS: 80.0000 mg | ORAL_TABLET | Freq: Two times a day (BID) | ORAL | Status: DC

## 2012-11-25 MED ORDER — TRAMADOL-ACETAMINOPHEN 37.5-325 MG PO TABS: 1.0000 | ORAL_TABLET | Freq: Three times a day (TID) | ORAL | Status: DC | PRN

## 2012-11-25 NOTE — Patient Instructions (Signed)
Please lose weight and decrease your portions and exercise more  Try the ultracet three times a day as needed for pain  Continue to take your lasix and other CHF and BP related medications  Continue metformin  Exercise as tolerated at least 30 minutes a day  Buy a scale that can support your weight and record your weight daily  You will go for x-ray's today  Please get your orange card filled  Knee Pain Knee pain can be a result of an injury or other medical conditions. Treatment will depend on the cause of your pain. HOME CARE  Only take medicine as told by your doctor.  Keep a healthy weight. Being overweight can make the knee hurt more.  Stretch before exercising or playing sports.  If there is constant knee pain, change the way you exercise. Ask your doctor for advice.  Make sure shoes fit well. Choose the right shoe for the sport or activity.  Protect your knees. Wear kneepads if needed.  Rest when you are tired. GET HELP RIGHT AWAY IF:   Your knee pain does not stop.  Your knee pain does not get better.  Your knee joint feels hot to the touch.  You have a temperature by mouth above 102 F (38.9 C), not controlled by medicine.  Your baby is older than 3 months with a rectal temperature of 102 F (38.9 C) or higher.  Your baby is 28 months old or younger with a rectal temperature of 100.4 F (38 C) or higher. MAKE SURE YOU:   Understand these instructions.  Will watch this condition.  Will get help right away if you are not doing well or get worse. Document Released: 11/02/2008 Document Revised: 10/29/2011 Document Reviewed: 11/02/2008 [facility] Patient Information 2013 [place].  Heart Failure Heart failure means your heart has trouble pumping blood. The blood is not circulated very well in your body because your heart is weak. Heart failure may cause blood to back up into your lungs. This is commonly called "fluid in the lungs." Heart failure may  also cause your ankles and legs to puff up (swell). It is important to take good care of yourself when you have heart failure. HOME CARE Medicine  Take your medicine as told by your doctor.  Do not stop taking your medicine unless told to by your doctor.  Be sure to get your medicine refilled before it runs out.  Do not skip any doses of medicine.  Tell your doctor if you cannot afford your medicine.  Keep a list of all the medicine you take. This should include the name, how much you take, and when you take it.  Ask your doctor if you have any questions about your medicine. Do not take over-the-counter medicine unless your doctor says it is okay. What you eat  Do not drink alcohol unless your doctor says it is okay.  Avoid food that is high in fat. Avoid foods fried in oil or made with fat.  Eat a healthy diet. A dietitian can help you with healthy food choices.  Limit how much salt you eat. Do not eat more than 1500 mg of salt (sodium) a day.  Do not add salt to your food.  Do not eat food made with a lot of salt. Here are some examples:  Canned vegetables.  Canned soups.  Canned drinks.  Hot dogs.  Fast food.  Pizza.  Chips. Check your weight  Weigh yourself every morning. You should  do this after you pee (urinate) and before you eat breakfast.  Wear the same amount of clothes each time you weigh yourself.  Write down your weight every day. Tell your doctor if you gain 3 lb/1.4 kg or more in 1 day or 5 lb/2.3 kg in a week. Blood pressure monitoring  Buy a home blood pressure cuff.  Check your blood pressure as told by your doctor. Write down your blood pressure numbers on a sheet of paper.  Bring your blood pressure numbers to your doctor visits. Smoking  Smoking is bad for your heart.  Ask your doctor how to stop smoking. Exercise  Talk to your doctor about exercise.  Ask how much exercise is right for you.  Exercise as much as you can. Stop if  you feel tired, have problems breathing, or have chest pain. Keep all your doctor appointments. GET HELP IF:   You gain 3 lb/1.4 kg or more in 1 day or 5 lb/2.3 kg in a week.  You have trouble breathing.  You have a cough that does not go away.  You have puffy ankles or legs.  You have an enlarged (bloated) belly (abdomen).  You cannot sleep because you have trouble breathing. GET HELP RIGHT AWAY IF:   You pass out (faint).  You have really bad chest pain or pressure. This includes pain or pressure in your:  Arms.  Jaw.  Neck.  Back. MAKE SURE YOU:   Understand these instructions.  Will watch your condition.  Will get help right away if you are not doing well or get worse. Document Released: 05/15/2008 Document Revised: 02/05/2012 Document Reviewed: 12/07/2008 [facility] Patient Information 2013 [place].

## 2012-11-25 NOTE — Assessment & Plan Note (Signed)
BP Readings from Last 3 Encounters:  11/25/12 164/102  10/23/12 135/98  10/09/12 112/77   Lab Results  Component Value Date   NA 142 10/23/2012   K 3.9 10/23/2012   CREATININE 1.15* 10/23/2012    Assessment: Blood pressure control:   Progress toward BP goal:  deteriorated Comments: did not take her BP medications this morning  Plan: Medications:  continue current medications lasix, lisinopril, and coreg Educational resources provided: brochure Self management tools provided:

## 2012-11-25 NOTE — Assessment & Plan Note (Signed)
Up to 404lbs today.  Claims to be compliant with her medications and watching what she eats.  No breathing complaints today.    -f/u heart failure clinic referral -needs orange card -continue lasix 44m daily, lisinopril and coreg.   -buy weight machine that can support he weight and record daily weights -daily exercise as tolerated

## 2012-11-25 NOTE — Progress Notes (Signed)
Discussed with resident.  Agree with plan.

## 2012-11-25 NOTE — Progress Notes (Signed)
Subjective:   [name] female   DOB: 1951/06/17 62 y.o.   MRN: [id number]  HPI: [name] is a 62 y.o. morbidly obese African American female with PMH of CHF (last echo 09/2012: severely depressed LVEF but poor study), NICM, HTN, OSA on CPAP, and gout presenting to clinic today for follow up visit   CHF: weight today increased to 404lbs from 392 on last visit, no respiratory distress at this time but does complain of baseline fatigue and DOE.   Her main complaint today is b/l knee pain, right>left.  Limited mobility due to right leg pain.    Otherwise, today, she denies any fever, chills, N/V/D, chest pain, abdominal pain, or any urinary complaints at this time.  Past Medical History  Diagnosis Date  . Hypertension   . Hyperlipidemia   . Diabetes mellitus   . Depression   . Nonischemic cardiomyopathy   . CHF (congestive heart failure) 2003    EF 30-35% on LV gram, Echo EF 45-50%  . Morbid obesity   . OSA on CPAP   . Gout   . Bilateral knee pain   . Cellulitis of lower leg     left  . H/O: hysterectomy   . Unspecified essential hypertension 03/22/2009    Qualifier: Diagnosis of  By: Karrie Meres RN, BSN, [name]    . OBSTRUCTIVE SLEEP APNEA 04/07/2009    Qualifier: Diagnosis of  By: Gwenette Greet MD, Armando Reichert   . HYPERLIPIDEMIA-MIXED 05/31/2009    Qualifier: Diagnosis of  By: Aundra Dubin, MD, Dalton    . Gout 10/09/2012  . Acute respiratory failure with hypoxia 09/26/2012  . Chronic combined systolic and diastolic congestive heart failure 10/10/2012    2 D echo 09/2012:  Left ventricle: Extremely poor acoustic windows limit study. Overall LVEF appears to be severely depressed. Recomm ordering limited study with contrast to further evaluate LV systolic function. The cavity size was normal. Wall thickness was normal. Doppler parameters are consistent with abnormal left ventricular relaxation (grade 1 diastolic dysfunction).   2 D echo 03/2009 :  Left ventricle: The cavity size  was normal. There was mild concentric hypertrophy. Systolic function was mildly reduced. The estimated ejection fraction was in the range of 45% to 50%. Diffuse hypokinesis. Doppler parameters are consistent with abnormal left ventricular relaxation (grade 1 diastolic dysfunction).     . Chronic kidney disease 10/10/2012    Stage 2 with GFR of 64   . Malignant hypertension 09/26/2012   Current Outpatient Prescriptions  Medication Sig Dispense Refill  . allopurinol (ZYLOPRIM) 100 MG tablet Take 2 tablets (200 mg total) by mouth daily.  30 tablet  2  . amitriptyline (ELAVIL) 50 MG tablet Take 1 tablet (50 mg total) by mouth at bedtime.  30 tablet  2  . aspirin EC 81 MG EC tablet Take 1 tablet (81 mg total) by mouth daily.  30 tablet  2  . buPROPion (WELLBUTRIN SR) 150 MG 12 hr tablet Take 1 tablet (150 mg total) by mouth 3 (three) times daily.  90 tablet  2  . carvedilol (COREG) 3.125 MG tablet Take 1 tablet (3.125 mg total) by mouth 2 (two) times daily with a meal.  60 tablet  2  . cyclobenzaprine (FLEXERIL) 10 MG tablet Take 1 tablet (10 mg total) by mouth 3 (three) times daily as needed.  60 tablet  0  . docusate sodium (COLACE) 100 MG capsule Take 1 capsule (100 mg total) by mouth daily as needed for constipation.  30 capsule  1  . ferrous fumarate (HEMOCYTE - 106 MG FE) 325 (106 FE) MG TABS Take 1 tablet (106 mg of iron total) by mouth daily.  30 each  0  . furosemide (LASIX) 80 MG tablet Take 1 tablet (80 mg total) by mouth 2 (two) times daily.  60 tablet  0  . lisinopril (PRINIVIL,ZESTRIL) 20 MG tablet Take 1 tablet (20 mg total) by mouth daily.  90 tablet  2  . metFORMIN (GLUCOPHAGE) 500 MG tablet Take 1 tablet (500 mg total) by mouth 2 (two) times daily with a meal.  60 tablet  2  . omega-3 acid ethyl esters (LOVAZA) 1 G capsule Take 2 capsules (2 g total) by mouth 2 (two) times daily.  120 capsule  2  . pravastatin (PRAVACHOL) 40 MG tablet Take 2 tablets (80 mg total) by mouth every evening.   30 tablet  2  . traMADol (ULTRAM) 50 MG tablet Take 1 tablet (50 mg total) by mouth every 8 (eight) hours as needed. For pain  60 tablet  2   No current facility-administered medications for this visit.   Family History  Problem Relation Age of Onset  . Asthma Mother   . Asthma Brother   . Asthma Brother   . Heart disease Mother   . Stroke Mother     clotting disorders   History   Social History  . Marital Status: Married    Spouse Name: N/A    Number of Children: 2  . Years of Education: N/A   Occupational History  . retired cna    Social History Main Topics  . Smoking status: Never Smoker   . Smokeless tobacco: Not on file  . Alcohol Use: No  . Drug Use: No  . Sexually Active: Not on file   Other Topics Concern  . Not on file   Social History Narrative  . No narrative on file   Review of Systems: Constitutional: Denies fever, chills, diaphoresis, appetite change and fatigue.  HEENT: Denies photophobia, eye pain, redness, hearing loss, ear pain, congestion, sore throat, rhinorrhea, sneezing, mouth sores, trouble swallowing, neck pain, neck stiffness and tinnitus.   Respiratory: DOE.  Denies SOB, cough, chest tightness,  and wheezing.   Cardiovascular: Denies chest pain, palpitations and leg swelling.  Gastrointestinal: Morbid obesity.  Denies nausea, vomiting, abdominal pain, diarrhea, constipation, blood in stool and abdominal distention.  Genitourinary: Denies dysuria, urgency, frequency, hematuria, flank pain and difficulty urinating.  Musculoskeletal: b/l knee pain and mobility Skin: Denies pallor, rash and wound.  Neurological: Denies dizziness, seizures, syncope, weakness, light-headedness, numbness and headaches.  Hematological: Denies adenopathy. Easy bruising, personal or family bleeding history  Psychiatric/Behavioral: Denies suicidal ideation, mood changes, confusion, nervousness, sleep disturbance and agitation  Objective:  Physical Exam: Filed  Vitals:   11/25/12 1330 11/25/12 1416  BP: 155/92 164/102  Pulse: 73 72  Temp: 97.6 F (36.4 C)   TempSrc: Oral   Height: _0  (1.6 m)   Weight: 404 lb 4.8 oz (183.389 kg)   SpO2: 100%    Constitutional: Vital signs reviewed.  Patient is a morbidly obese female in no acute distress and cooperative with exam. Alert and oriented x3.  Head: Normocephalic and atraumatic Ear: TM normal bilaterally Mouth: no erythema or exudates, MMM Eyes: PERRL, EOMI, conjunctivae normal, No scleral icterus.  Neck: Supple, Trachea midline normal ROM Cardiovascular: distant heart sounds due to body habitus, RRR, no MRG, pulses symmetric and intact bilaterally Pulmonary/Chest: CTAB, no  wheezes, rales, or rhonchi Abdominal: Soft. Obese, Non-tender, non-distended, bowel sounds are normal, no masses, organomegaly, or guarding present.  GU: no CVA tenderness Musculoskeletal: limited ROM of lower extremities, unable to lift legs much in the air due to complaints of b/l knee pain, right knee tender to palpation, +1 pitting edema b/l lower extremities.  Hematology: no cervical, inginal, or axillary adenopathy.  Neurological: A&O x3, Strength is normal and symmetric bilaterally, cranial nerve II-XII are grossly intact, no focal motor deficit, sensory intact to light touch bilaterally.  Skin: Warm, dry and intact. No rash, cyanosis, or clubbing.  Psychiatric: Normal mood and affect. speech and behavior is normal. Judgment and thought content normal. Cognition and memory are normal.   Assessment & Plan:  Discussed with Dr. Stann Mainland  -Knee pain: b/l R&L xray's.  Change tramadol to ultracet.

## 2012-12-29 ENCOUNTER — Encounter (HOSPITAL_COMMUNITY): Payer: PRIVATE HEALTH INSURANCE

## 2012-12-30 ENCOUNTER — Telehealth: Payer: Self-pay | Admitting: Internal Medicine

## 2012-12-30 ENCOUNTER — Other Ambulatory Visit: Payer: Self-pay | Admitting: Internal Medicine

## 2012-12-30 DIAGNOSIS — E119 Type 2 diabetes mellitus without complications: Secondary | ICD-10-CM

## 2012-12-30 DIAGNOSIS — G4700 Insomnia, unspecified: Secondary | ICD-10-CM

## 2012-12-30 DIAGNOSIS — F329 Major depressive disorder, single episode, unspecified: Secondary | ICD-10-CM

## 2012-12-30 MED ORDER — AMITRIPTYLINE HCL 50 MG PO TABS: 25.0000 mg | ORAL_TABLET | Freq: Every day | ORAL | Status: DC

## 2012-12-30 MED ORDER — TRAZODONE HCL 50 MG PO TABS: 50.0000 mg | ORAL_TABLET | Freq: Two times a day (BID) | ORAL | Status: DC

## 2012-12-30 NOTE — Telephone Encounter (Signed)
[facility] does have trazodone for the low price of $6

## 2012-12-30 NOTE — Telephone Encounter (Signed)
Dear Edd Fabian,  Based on side effect profiles on cost, i would like to do this:  Decrease her amitriptyline dose to 75m daily from now and for her to complete her current supply of buproprion.  Once she has been off the buproprion for 14 days, she needs to call the clinic and i will start her on celexa which is [facility]4 at [facility] the cheapest option with the least side effects.  If she doesn't want this, then we can try monotherapy with trazodone for insomnia and depression, however, more risk of side effects.  i did change the amitriptyline dose on epic but will need to be called to pharmacy and patient notified.

## 2012-12-30 NOTE — Telephone Encounter (Signed)
Is trazodone covered at [facility]?

## 2012-12-30 NOTE — Telephone Encounter (Addendum)
I called and spoke to [name] earlier today in regards to her wellbutrin cost.  She says they can no longer provide it for her at [facility] and that it works well for her.  She is also on amitriptyline at night time to help her sleep.  She really likes wellbutrin but cannot afford it.  She is willing to try something new but was hesitant to come off the amitriptyline.    Therefore, i propose two options: 1) to wean off amitriptyline by decreasing dose to 36m qhs and also wean off wellbutrin to finish off current supply of wellbutrin and slowly start her celexa for depression which will also help with insomnia [place] [facility] [place]. Celexa will start at 241mdaily and hopefully should be enough of a dose to help with her depression.   . Marland Kitchen  2) The second option would be to try monotherapy with trazodone for both depression and insomnia.  However, the side effect profile is more for trazodone as compared to celexa.    Either way, we need to wean off amitriptyline and try another agent that she can afford. I called and left a message to go over the plan. She will need to call back.  Message has also been sent to triage.  Discussed with Dr. PaMurlean CallerAddendum for telephone call 01/01/13:  I spoke with [name] in regards to her depression medications.  We will proceed with tapering plan at this time and using celexa and will not proceed with trying trazodone at this time.    She is almost out of her wellbutrin and cannot afford another prescription.  Thus, she will start her celexa which I [facility] at this time.  She has also been tapering her amitriptyline until she is finished.  We will then try her with monotherapy with celexa and adjust as necessary.  We may need to add sleeping agent, ?ambien if her insomnia continues.    She understands the above plan and was able to relay it back to me appropriately.  She is advised to call me if she  notices any worsening or change in her depression and come to clinic.    She also requests refills on her metformin today and I have reminded her about her heart failure clinic appointment next week.

## 2012-12-30 NOTE — Telephone Encounter (Signed)
Pt has been getting bupropion from [facility], they no longer can get this med.  Can you change to cymbalta, Effexor or Prozac.   [facility] can get these meds for pt.  Pt # [id number]

## 2013-01-01 ENCOUNTER — Other Ambulatory Visit: Payer: Self-pay | Admitting: *Deleted

## 2013-01-01 ENCOUNTER — Other Ambulatory Visit: Payer: Self-pay | Admitting: Internal Medicine

## 2013-01-01 DIAGNOSIS — F329 Major depressive disorder, single episode, unspecified: Secondary | ICD-10-CM

## 2013-01-01 MED ORDER — CARVEDILOL 3.125 MG PO TABS: 3.1250 mg | ORAL_TABLET | Freq: Two times a day (BID) | ORAL | Status: DC

## 2013-01-01 MED ORDER — METFORMIN HCL 500 MG PO TABS: 500.0000 mg | ORAL_TABLET | Freq: Two times a day (BID) | ORAL | Status: DC

## 2013-01-01 MED ORDER — CITALOPRAM HYDROBROMIDE 20 MG PO TABS: 20.0000 mg | ORAL_TABLET | Freq: Every day | ORAL | Status: DC

## 2013-01-01 NOTE — Telephone Encounter (Signed)
Dear Robin Hardin,  So sorry.  I had to change her management again, thus, i have made the changes and called [name] personally and adjusted her medications.  I have written for [facility] which she will pick up and continue to taper off her wellbutrin and amitriptyline.    She did ask for refills for metformin, please call that in to [facility].  Thanks!  Dr q

## 2013-01-01 NOTE — Telephone Encounter (Signed)
Call from [facility] - 1. wants to know if pt suppose to be on Arthrotec and Diovan 2. Have they been stopped , if so, were they changed to something else. Apparently,pt gave them a list of meds - needs clarification. Thanks

## 2013-01-01 NOTE — Telephone Encounter (Signed)
Dear Robin Hardin,  Reviewing her current medication list: she is no longer on those medications.  She is currently on lisinopril and ultracet.   Also, i have refilled her coreg, however, she was referred to heart failure clinic by me on last visit and also cardiologist.  Before she did not have an orange card, that has since then been resolved.  Can we please urgently push for her to be scheduled and seen by HF team or cardiology as soon as possible now?  Thanks,  Dr. Barnetta Chapel

## 2013-01-01 NOTE — Telephone Encounter (Signed)
Pt has an appt 5/19 at CHF clinic per [facility].

## 2013-01-01 NOTE — Telephone Encounter (Signed)
Coreg rx called to [facility] also left message that pt is no longer on Arthrotec and Diovan but is taking Lisinopril and Ultarcet per Dr Eula Fried.

## 2013-01-01 NOTE — Telephone Encounter (Signed)
Dr Barnetta Chapel, what about Arthrotec and Diovan? Thanks

## 2013-01-01 NOTE — Addendum Note (Signed)
Addended by: Wilber Oliphant on: 01/01/2013 04:38 PM   Modules accepted: Orders

## 2013-01-02 ENCOUNTER — Telehealth: Payer: Self-pay | Admitting: *Deleted

## 2013-01-02 NOTE — Telephone Encounter (Signed)
Pt called to remind her of appt Mon 5/19 w/CHF clinic; no answer - message left.

## 2013-01-02 NOTE — Telephone Encounter (Signed)
Message copied by Ebbie Latus on Fri Jan 02, 2013  1:51 PM ------      Message from: Wilber Oliphant      Created: Thu Jan 01, 2013  3:50 PM       Oh great! Please call and remind her to keep this appointment.            Thank you!            Dr q      ----- Message -----         From: Ebbie Latus, RN         Sent: 01/01/2013   2:39 PM           To: Jerene Pitch, MD            Dr Barnetta Chapel,      Pt has an appt 5/19 at CHF clinic per [facility]; she had cancelled her appt 5/12. Thanks       ------

## 2013-01-05 ENCOUNTER — Encounter (HOSPITAL_COMMUNITY): Payer: Self-pay

## 2013-01-05 ENCOUNTER — Ambulatory Visit (HOSPITAL_COMMUNITY)
Admission: RE | Admit: 2013-01-05 | Discharge: 2013-01-05 | Disposition: A | Discharge status: Home / Self Care | Payer: PRIVATE HEALTH INSURANCE | Source: Ambulatory Visit | Attending: Internal Medicine | Admitting: Internal Medicine

## 2013-01-05 VITALS — BP 128/80 | HR 72 | Wt 394.4 lb

## 2013-01-05 DIAGNOSIS — I509 Heart failure, unspecified: Secondary | ICD-10-CM

## 2013-01-05 DIAGNOSIS — I5042 Chronic combined systolic (congestive) and diastolic (congestive) heart failure: Secondary | ICD-10-CM | POA: Insufficient documentation

## 2013-01-05 MED ORDER — CARVEDILOL 3.125 MG PO TABS: 6.2500 mg | ORAL_TABLET | Freq: Two times a day (BID) | ORAL | Status: DC

## 2013-01-05 NOTE — Progress Notes (Addendum)
Referring Physician: Dr. Eula Fried Primary Care: Dr Eula Fried Primary Cardiologist: Dr. Aundra Dubin  HPI:  Robin Hardin is a 62 y.o. AAF with history of morbid obesity, HTN, and diastolic HF diagnosed in 2725.  She was eventually diagnosed with systolic heart failure by v-gram with an EF 30-35%.  LHC showed no significant coronary disease.  She also has OSA with CPAP.   She had stopped taking her fluid pills in Feb 2014 due to cramping and ended up being admitted with respiratory failure.  She was diuresed ~40 pounds.  Her torsemide was changed to lasix to avoid cramping.  Repeat echo during admission showed LVEF 25%.    She has been referred by her PCP for further follow up in the HF clinic.  She has been followed by Dr. Aundra Dubin in the past but has not seen him in 2 years.  She feels that her weight has remained stable since discharge although she is not weighing.  It is hard for her to walk due to pain in her knees.  She denies dyspnea with the activity that she is able to do.  She wears her CPAP nightly but usually wakes up to CPAP on her bed.  She denies orthopnea or PND.  No chest pain.  Compliant with meds.     Review of Systems: [y] = yes, _0  = no   General: Weight gain _1 ; Weight loss _2 ; Anorexia _3 ; Fatigue Blue.Reese ]; Fever _4 ; Chills _5 ; Weakness _6   Cardiac: Chest pain/pressure _7 ; Resting SOB _8 ; Exertional SOB _9 ; Orthopnea _10 ; Pedal Edema _11 ; Palpitations _12 ; Syncope _13 ; Presyncope _14 ; Paroxysmal nocturnal dyspnea_15   Pulmonary: Cough _16 ; Wheezing_17 ; Hemoptysis_18 ; Sputum _19 ; Snoring _20   GI: Vomiting_21 ; Dysphagia_22 ; Melena_23 ; Hematochezia _24 ; Heartburn_25 ; Abdominal pain _26 ; Constipation _27 ; Diarrhea _28 ; BRBPR _29   GU: Hematuria_30 ; Dysuria _31 ; Nocturia_32   Vascular: Pain in legs with walking _33 ; Pain in feet with lying flat _34 ; Non-healing sores _35 ; Stroke _36 ; TIA _37 ; Slurred speech _38 ;  Neuro: Headaches_39 ; Vertigo_40 ; Seizures_41 ; Paresthesias_42 ;Blurred  vision _43 ; Diplopia _44 ; Vision changes _45   Ortho/Skin: Arthritis _46 ; Joint pain [ y]; Muscle pain _47 ; Joint swelling _48 ; Back Pain _49 ; Rash _50   Psych: Depression_51 ; Anxiety_52   Heme: Bleeding problems _53 ; Clotting disorders _54 ; Anemia _55   Endocrine: Diabetes Blue.Reese ]; Thyroid dysfunction_56    Past Medical History  Diagnosis Date  . Hypertension   . Hyperlipidemia   . Diabetes mellitus   . Depression   . Nonischemic cardiomyopathy   . CHF (congestive heart failure) 2003    EF 30-35% on LV gram, Echo EF 45-50%  . Morbid obesity   . OSA on CPAP   . Gout   . Bilateral knee pain   . Cellulitis of lower leg     left  . H/O: hysterectomy   . Unspecified essential hypertension 03/22/2009    Qualifier: Diagnosis of  By: Karrie Meres RN, BSN, [name]    . OBSTRUCTIVE SLEEP APNEA 04/07/2009    Qualifier: Diagnosis of  By: Gwenette Greet MD, Armando Reichert   . HYPERLIPIDEMIA-MIXED 05/31/2009    Qualifier: Diagnosis of  By: Aundra Dubin, MD, Dalton    . Gout 10/09/2012  . Acute respiratory failure with hypoxia 09/26/2012  .  Chronic combined systolic and diastolic congestive heart failure 10/10/2012    2 D echo 09/2012:  Left ventricle: Extremely poor acoustic windows limit study. Overall LVEF appears to be severely depressed. Recomm ordering limited study with contrast to further evaluate LV systolic function. The cavity size was normal. Wall thickness was normal. Doppler parameters are consistent with abnormal left ventricular relaxation (grade 1 diastolic dysfunction).   2 D echo 03/2009 :  Left ventricle: The cavity size was normal. There was mild concentric hypertrophy. Systolic function was mildly reduced. The estimated ejection fraction was in the range of 45% to 50%. Diffuse hypokinesis. Doppler parameters are consistent with abnormal left ventricular relaxation (grade 1 diastolic dysfunction).     . Chronic kidney disease 10/10/2012    Stage 2 with GFR of 64   . Malignant hypertension 09/26/2012    Current  Outpatient Prescriptions  Medication Sig Dispense Refill  . allopurinol (ZYLOPRIM) 100 MG tablet Take 2 tablets (200 mg total) by mouth daily.  30 tablet  2  . amitriptyline (ELAVIL) 50 MG tablet Take 0.5 tablets (25 mg total) by mouth at bedtime.  30 tablet  0  . aspirin EC 81 MG EC tablet Take 1 tablet (81 mg total) by mouth daily.  30 tablet  2  . carvedilol (COREG) 3.125 MG tablet Take 1 tablet (3.125 mg total) by mouth 2 (two) times daily with a meal.  60 tablet  1  . citalopram (CELEXA) 20 MG tablet Take 1 tablet (20 mg total) by mouth daily.  30 tablet  1  . cyclobenzaprine (FLEXERIL) 10 MG tablet Take 1 tablet (10 mg total) by mouth 3 (three) times daily as needed.  60 tablet  0  . ferrous fumarate (HEMOCYTE - 106 MG FE) 325 (106 FE) MG TABS Take 1 tablet (106 mg of iron total) by mouth daily.  30 each  0  . furosemide (LASIX) 80 MG tablet Take 1 tablet (80 mg total) by mouth 2 (two) times daily.  60 tablet  2  . lisinopril (PRINIVIL,ZESTRIL) 20 MG tablet Take 1 tablet (20 mg total) by mouth daily.  90 tablet  2  . metFORMIN (GLUCOPHAGE) 500 MG tablet Take 1 tablet (500 mg total) by mouth 2 (two) times daily with a meal.  60 tablet  2  . omega-3 acid ethyl esters (LOVAZA) 1 G capsule Take 2 capsules (2 g total) by mouth 2 (two) times daily.  120 capsule  2  . pravastatin (PRAVACHOL) 40 MG tablet Take 2 tablets (80 mg total) by mouth every evening.  30 tablet  2  . traMADol-acetaminophen (ULTRACET) 37.5-325 MG per tablet TAKE ONE TABLET BY MOUTH EVERY 8 HOURS AS NEEDED FOR PAIN  90 tablet  1  . docusate sodium (COLACE) 100 MG capsule Take 1 capsule (100 mg total) by mouth daily as needed for constipation.  30 capsule  1   No current facility-administered medications for this encounter.    No Known Allergies  History   Social History  . Marital Status: Married    Spouse Name: N/A    Number of Children: 2  . Years of Education: N/A   Occupational History  . retired cna    Social  History Main Topics  . Smoking status: Never Smoker   . Smokeless tobacco: Not on file  . Alcohol Use: No  . Drug Use: No  . Sexually Active: No   Other Topics Concern  . Not on file   Social History Narrative  .  No narrative on file    Family History  Problem Relation Age of Onset  . Asthma Mother   . Asthma Brother   . Asthma Brother   . Heart disease Mother   . Stroke Mother     clotting disorders    PHYSICAL EXAM: Filed Vitals:   01/05/13 1439  BP: 128/80  Pulse: 72  Weight: 394 lb 6.4 oz (178.899 kg)  SpO2: 97%    General:  Markedly obese. Sitting in [place]. No respiratory difficulty HEENT: normal Neck: supple. Unable to clearly see JVP. Carotids 2+ bilat; no bruits. No lymphadenopathy or thryomegaly appreciated. Cor: PMI nonpalpable. Regular rate & rhythm. 2/6 TR. Lungs: clear Abdomen: markedly obese, soft, nontender, nondistended.  No bruits or masses. Good bowel sounds. Extremities: no cyanosis, clubbing, rash, edema Neuro: alert & oriented x 3, cranial nerves grossly intact. moves all 4 extremities w/o difficulty. Affect pleasant.    ASSESSMENT & PLAN:

## 2013-01-05 NOTE — Patient Instructions (Addendum)
Increase carvedilol 2 tabs (6.25 mg) twice daily.  Follow up with Dr. Aundra Dubin in a month  Follow up with Dr. Haroldine Laws in 2 months  Do the following things EVERYDAY: 1) Weigh yourself in the morning before breakfast. Write it down and keep it in a log. 2) Take your medicines as prescribed 3) Eat low salt foods-Limit salt (sodium) to 2000 mg per day.  4) Stay as active as you can everyday 5) Limit all fluids for the day to less than 2 liters

## 2013-01-06 ENCOUNTER — Other Ambulatory Visit: Payer: Self-pay | Admitting: *Deleted

## 2013-01-06 MED ORDER — LISINOPRIL 20 MG PO TABS: 20.0000 mg | ORAL_TABLET | Freq: Every day | ORAL | Status: DC

## 2013-01-06 NOTE — Telephone Encounter (Signed)
I would like to continue lisinopril at this time and have her see heart failure clinic to see what they prefer at this time.  We may change in the future if needed.

## 2013-01-06 NOTE — Telephone Encounter (Signed)
[facility] can get diovan for free.  Would you consider a change?

## 2013-01-06 NOTE — Assessment & Plan Note (Addendum)
Volume status looks well controlled on current regimen.  Have discussed use of sliding scale lasix and need for daily weights.  She is going to start weighing again.  Have also educated on low sodium diet and fluid restrictions.  Will continue to titrate HF medications, increase carvedilol 6.25 mg BID.  Will have her follow up with Dr. Aundra Dubin in 1 month and in the HF clinic in 3 months.   Patient seen and examined with Leone Haven, PA-C. We discussed all aspects of the encounter. I agree with the assessment and plan as stated above.  She currently is feeling better but activity limited by her body habitus. Volume status looks OK but once again it is a bit difficult to assess. Agree with titrating b-blocker. Extensive HF education provided. Reinforced need for daily weights and reviewed use of sliding scale diuretics. Continue weight loss efforts.

## 2013-01-07 NOTE — Telephone Encounter (Signed)
Response faxed to pharmacy

## 2013-01-15 ENCOUNTER — Ambulatory Visit (HOSPITAL_COMMUNITY): Payer: PRIVATE HEALTH INSURANCE | Attending: Internal Medicine

## 2013-01-20 ENCOUNTER — Other Ambulatory Visit: Payer: Self-pay | Admitting: *Deleted

## 2013-01-22 MED ORDER — TRAMADOL-ACETAMINOPHEN 37.5-325 MG PO TABS: ORAL_TABLET | ORAL | Status: DC

## 2013-01-22 MED ORDER — FERROUS FUMARATE 325 (106 FE) MG PO TABS: 1.0000 | ORAL_TABLET | Freq: Every day | ORAL | Status: DC

## 2013-01-22 NOTE — Telephone Encounter (Signed)
Rx called into [facility]

## 2013-02-13 ENCOUNTER — Other Ambulatory Visit: Payer: Self-pay | Admitting: *Deleted

## 2013-02-13 MED ORDER — PRAVASTATIN SODIUM 40 MG PO TABS: 80.0000 mg | ORAL_TABLET | Freq: Every evening | ORAL | Status: DC

## 2013-02-13 NOTE — Telephone Encounter (Signed)
Rx faxed in  # 60 a month

## 2013-02-17 NOTE — Telephone Encounter (Signed)
Called to pharm

## 2013-03-03 ENCOUNTER — Encounter: Payer: Self-pay | Admitting: Internal Medicine

## 2013-03-03 ENCOUNTER — Ambulatory Visit (INDEPENDENT_AMBULATORY_CARE_PROVIDER_SITE_OTHER): Payer: PRIVATE HEALTH INSURANCE | Admitting: Internal Medicine

## 2013-03-03 VITALS — BP 144/86 | HR 66 | Temp 98.2°F | Wt 394.2 lb

## 2013-03-03 DIAGNOSIS — G4700 Insomnia, unspecified: Secondary | ICD-10-CM

## 2013-03-03 DIAGNOSIS — F329 Major depressive disorder, single episode, unspecified: Secondary | ICD-10-CM

## 2013-03-03 DIAGNOSIS — K5900 Constipation, unspecified: Secondary | ICD-10-CM

## 2013-03-03 DIAGNOSIS — I5042 Chronic combined systolic (congestive) and diastolic (congestive) heart failure: Secondary | ICD-10-CM

## 2013-03-03 DIAGNOSIS — I509 Heart failure, unspecified: Secondary | ICD-10-CM

## 2013-03-03 DIAGNOSIS — E119 Type 2 diabetes mellitus without complications: Secondary | ICD-10-CM

## 2013-03-03 DIAGNOSIS — M1710 Unilateral primary osteoarthritis, unspecified knee: Secondary | ICD-10-CM

## 2013-03-03 DIAGNOSIS — I10 Essential (primary) hypertension: Secondary | ICD-10-CM

## 2013-03-03 DIAGNOSIS — Z79891 Long term (current) use of opiate analgesic: Secondary | ICD-10-CM | POA: Insufficient documentation

## 2013-03-03 MED ORDER — FLUOXETINE HCL 20 MG PO CAPS: 20.0000 mg | ORAL_CAPSULE | Freq: Every day | ORAL | Status: DC

## 2013-03-03 MED ORDER — LISINOPRIL 20 MG PO TABS: 20.0000 mg | ORAL_TABLET | Freq: Every day | ORAL | Status: DC

## 2013-03-03 MED ORDER — ZOLPIDEM TARTRATE 10 MG PO TABS: 5.0000 mg | ORAL_TABLET | Freq: Every evening | ORAL | Status: DC | PRN

## 2013-03-03 MED ORDER — METFORMIN HCL 500 MG PO TABS: 500.0000 mg | ORAL_TABLET | Freq: Two times a day (BID) | ORAL | Status: DC

## 2013-03-03 MED ORDER — ALLOPURINOL 100 MG PO TABS: 200.0000 mg | ORAL_TABLET | Freq: Every day | ORAL | Status: DC

## 2013-03-03 MED ORDER — HYDROCODONE-ACETAMINOPHEN 5-325 MG PO TABS: 1.0000 | ORAL_TABLET | Freq: Four times a day (QID) | ORAL | Status: DC | PRN

## 2013-03-03 MED ORDER — CITALOPRAM HYDROBROMIDE 20 MG PO TABS: 40.0000 mg | ORAL_TABLET | Freq: Every day | ORAL | Status: DC

## 2013-03-03 MED ORDER — DOCUSATE SODIUM 100 MG PO CAPS: 100.0000 mg | ORAL_CAPSULE | Freq: Every day | ORAL | Status: DC | PRN

## 2013-03-03 NOTE — Assessment & Plan Note (Addendum)
Limited success with celexa but on max dose of 51m with age >>76  Has had success with prozac in the past, will d/c celexa and transition to prozac 285mtoday and will likely titrate up as needed.  Denies suicidal or homicidal ideation.  Had success with wellbutrin but can no longer afford.  -prozac 2031md -d/c celexa and amitriptyline -add ambien low dose for sleep

## 2013-03-03 NOTE — Patient Instructions (Addendum)
General Instructions:  Please follow up with heart failure clinic next week and discuss referral to cardiology as well.  We have switched your from Celexa to fluxetine for your depression, please start taking that  We will also try you on Vicodin for your knee pain to see if that helps, please stop taking your tramadol  I have started you on low dose ambien as well to help you sleep, take 1/2 tablet at night to see if that helps you.    PLEASE WEIGH YOURSELF DAILY AND WRITE YOUR WEIGHT DOWN AND BRING TO YOUR NEXT APPOINTMENT FOR HEART FAILURE CLINIC AND OPC  Please try to exercise if possible as much as tolerated and work on your diet to continue to lose weight  If you notice worsening swelling or shortness of breath or chest pain, call and let us know right away or go to ED  If your depression gets worse or does not improve, call me and let me know  Treatment Goals:  Goals (1 Years of Data) as of 03/03/13         As of Today As of Today 01/05/13 11/25/12 11/25/12     Blood Pressure    . Blood Pressure < 140/90  144/86 150/92 128/80 164/102 155/92     Lifestyle    . Prevent Falls            Progress Toward Treatment Goals:  Treatment Goal 03/03/2013  Blood pressure deteriorated  Prevent falls at goal    Self Care Goals & Plans:  Self Care Goal 03/03/2013  Manage my medications take my medicines as prescribed; bring my medications to every visit  Monitor my health -  Eat healthy foods eat more vegetables  Be physically active find an activity I enjoy    Care Management & Community Referrals:    Fluoxetine capsules or tablets (Depression/Mood Disorders) What is this medicine? FLUOXETINE (floo OX e teen) belongs to a class of drugs known as selective serotonin reuptake inhibitors (SSRIs). It helps to treat mood problems such as depression, obsessive compulsive disorder, and panic attacks. It can also treat certain eating disorders. This medicine may be used for other  purposes; ask your health care provider or pharmacist if you have questions. What should I tell my health care provider before I take this medicine? They need to know if you have any of these conditions: -bipolar disorder or mania -diabetes -glaucoma -liver disease -psychosis -seizures -suicidal thoughts or history of attempted suicide -an unusual or allergic reaction to fluoxetine, other medicines, foods, dyes, or preservatives -pregnant or trying to get pregnant -breast-feeding How should I use this medicine? Take this medicine by mouth with a glass of water. Follow the directions on the prescription label. You can take this medicine with or without food. Take your medicine at regular intervals. Do not take it more often than directed. Do not stop taking except on your doctor's advice. A special MedGuide will be given to you by the pharmacist with each prescription and refill. Be sure to read this information carefully each time. Talk to your pediatrician regarding the use of this medicine in children. While this drug may be prescribed for children as young as 7 years for selected conditions, precautions do apply. Overdosage: If you think you have taken too much of this medicine contact a poison control center or emergency room at once. NOTE: This medicine is only for you. Do not share this medicine with others. What if I miss a dose?  If you miss a dose, skip the missed dose and go back to your regular dosing schedule. Do not take double or extra doses. What may interact with this medicine? Do not take fluoxetine with any of the following medications: -other medicines containing fluoxetine, like Sarafem or Symbyax -certain diet drugs like dexfenfluramine, fenfluramine, phentermine -cisapride -linezolid -medicines called MAO Inhibitors like Azilect, Carbex, Eldepryl, Marplan, Nardil, and Parnate -methylene blue -pimozide -procarbazine -thioridazine -tryptophan Fluoxetine may also  interact with the following medications: -alcohol -any other medicines for depression, anxiety, or psychotic disturbances -aspirin and aspirin-like medicines -carbamazepine -cyproheptadine -dextromethorphan -flecainide -lithium -medicines for diabetes -medicines for migraine headache, like sumatriptan -medicines for sleep -medicines that treat or prevent blood clots like warfarin, enoxaparin, and dalteparin -metoprolol -NSAIDs, medicines for pain and inflammation, like ibuprofen or naproxen -phenytoin -propafenone -propranolol -St. John's wort -vinblastine This list may not describe all possible interactions. Give your health care provider a list of all the medicines, herbs, non-prescription drugs, or dietary supplements you use. Also tell them if you smoke, drink alcohol, or use illegal drugs. Some items may interact with your medicine. What should I watch for while using this medicine? Visit your doctor or health care professional for regular checks on your progress. Continue to take your medicine even if you do not immediately feel better. It can take several weeks before you notice the full effect of this medicine. Patients and their families should watch out for worsening depression or thoughts of suicide. Also watch out for any sudden or severe changes in feelings such as feeling anxious, agitated, panicky, irritable, hostile, aggressive, impulsive, severely restless, overly excited and hyperactive, or not being able to sleep. If this happens, especially at the beginning of treatment or after a change in dose, call your doctor. You may get drowsy or dizzy. Do not drive, use machinery, or do anything that needs mental alertness until you know how this medicine affects you. Do not stand or sit up quickly, especially if you are an older patient. This reduces the risk of dizzy or fainting spells. Alcohol can make you more drowsy and dizzy. Avoid alcoholic drinks. Your mouth may get dry.  Chewing sugarless gum or sucking hard candy, and drinking plenty of water may help. Contact your doctor if the problem does not go away or is severe. If you have diabetes, this medicine may affect blood sugar levels. Check your blood sugar. Talk to your doctor or health care professional if you notice changes. If you have been taking this medicine regularly for some time, do not suddenly stop taking it. You must gradually reduce the dose or you may get side effects or have a worsening of your condition. Ask your doctor or health care professional for advice. Do not treat yourself for coughs, colds or allergies without asking your doctor or health care professional for advice. Some ingredients can increase possible side effects. What side effects may I notice from receiving this medicine? Side effects that you should report to your doctor or health care professional as soon as possible: -allergic reactions like skin rash, itching or hives, swelling of the face, lips, or tongue -breathing problems -confusion -fast or irregular heart rate, palpitations -flu-like fever, chills, cough, muscle or joint aches and pains -seizures -suicidal thoughts or other mood changes -tremors -trouble sleeping -unusual bleeding or bruising -unusually tired or weak -vomiting Side effects that usually do not require medical attention (report to your doctor or health care professional if they continue or are bothersome): -blurred  vision -change in sex drive or performance -diarrhea -dry mouth -flushing -headache -increased or decreased appetite -nausea -sweating This list may not describe all possible side effects. Call your doctor for medical advice about side effects. You may report side effects to FDA at 1-800-FDA-1088. Where should I keep my medicine? Keep out of the reach of children. Store at room temperature between 15 and 30 degrees C (59 and 86 degrees F). Throw away any unused medicine after the  expiration date. NOTE: This sheet is a summary. It may not cover all possible information. If you have questions about this medicine, talk to your doctor, pharmacist, or health care provider.  2012, [employer]. (03/17/2010 3:23:25 PM) Zolpidem tablets What is this medicine? ZOLPIDEM (zole PI dem) is used to treat insomnia. This medicine helps you to fall asleep and sleep through the night. This medicine may be used for other purposes; ask your health care provider or pharmacist if you have questions. What should I tell my health care provider before I take this medicine? They need to know if you have any of these conditions: -depression -history of a drug or alcohol abuse problem -liver disease -lung or breathing disease -suicidal thoughts -an unusual or allergic reaction to zolpidem, other medicines, foods, dyes, or preservatives -pregnant or trying to get pregnant -breast-feeding How should I use this medicine? Take this medicine by mouth with a glass of water. Follow the directions on the prescription label. It is better to take this medicine on an empty stomach and only when you are ready for bed. Do not take your medicine more often than directed. If you have been taking this medicine for several weeks and suddenly stop taking it, you may get unpleasant withdrawal symptoms. Your doctor or health care professional may want to gradually reduce the dose. Do not stop taking this medicine on your own. Always follow your doctor or health care professional's advice. A special MedGuide will be given to you by the pharmacist with each prescription and refill. Be sure to read this information carefully each time. Talk to your pediatrician regarding the use of this medicine in children. Special care may be needed. Overdosage: If you think you have taken too much of this medicine contact a poison control center or emergency room at once. NOTE: This medicine is only for you. Do not share  this medicine with others. What if I miss a dose? This does not apply. This medicine should only be taken immediately before going to sleep. Do not take double or extra doses. What may interact with this medicine? -herbal medicines like kava kava, melatonin, St. John's wort and valerian -medicines for fungal infections like ketoconazole, fluconazole, or itraconazole -medicines for treating depression or other mental problems -other medicines given for sleep -some medicines for Parkinson' s disease or other movement disorders -some medicines used to treat HIV infection or AIDS, like ritonavir This list may not describe all possible interactions. Give your health care provider a list of all the medicines, herbs, non-prescription drugs, or dietary supplements you use. Also tell them if you smoke, drink alcohol, or use illegal drugs. Some items may interact with your medicine. What should I watch for while using this medicine? Visit your doctor or health care professional for regular checks on your progress. Keep a regular sleep schedule by going to bed at about the same time each night. Avoid caffeine-containing drinks in the evening hours. When sleep medicines are used every night for more than a few weeks,  they may stop working. Talk to your doctor if you still have trouble sleeping. Do not take this medicine unless you are able to get a full night's sleep before you must be active again. You may not be able to remember things that you do in the hours after you take this medicine. Some people have reported driving, making phone calls, or preparing and eating food while asleep after taking sleep medicine. Take this medicine right before going to sleep. Tell your doctor if you are have any problems with your memory. After you stop taking this medicine, you may have trouble falling asleep. This is called rebound insomnia. This problem usually goes away on its own after 1 or 2 nights. You may get drowsy or  dizzy. Do not drive, use machinery, or do anything that needs mental alertness until you know how this medicine affects you. Do not stand or sit up quickly, especially if you are an older patient. This reduces the risk of dizzy or fainting spells. Alcohol may interfere with the effect of this medicine. Avoid alcoholic drinks. This medicine may cause a decrease in mental alertness the morning after use, even if you feel that you are fully awake. Tell your doctor if you will need to perform activities requiring full alertness, such as driving, the next morning after you have taken this medicine. If you or your family notice any changes in your behavior, or if you have any unusual or disturbing thoughts, call your doctor right away. What side effects may I notice from receiving this medicine? Side effects that you should report to your doctor or health care professional as soon as possible: -allergic reactions like skin rash, itching or hives, swelling of the face, lips, or tongue -changes in vision -confusion -depressed mood -feeling faint or lightheaded, falls -hallucinations -problems with balance, speaking, walking -restlessness, excitability, or feelings of agitation -unusual activities while asleep like driving, eating, making phone calls Side effects that usually do not require medical attention (report to your doctor or health care professional if they continue or are bothersome): -diarrhea -dizziness, or daytime drowsiness, sometimes called a hangover effect -headache This list may not describe all possible side effects. Call your doctor for medical advice about side effects. You may report side effects to FDA at 1-800-FDA-1088. Where should I keep my medicine? Keep out of the reach of children. This medicine can be abused. Keep your medicine in a safe place to protect it from theft. Do not share this medicine with anyone. Selling or giving away this medicine is dangerous and against the  law. Store at room temperature between 20 and 25 degrees C (68 and 77 degrees F). Throw away any unused medicine after the expiration date. NOTE: This sheet is a summary. It may not cover all possible information. If you have questions about this medicine, talk to your doctor, pharmacist, or health care provider.  2013, [employer] Standard. (08/30/2011 5:34:04 PM) Acetaminophen; Hydrocodone tablets or capsules What is this medicine? ACETAMINOPHEN; HYDROCODONE (a set a MEE noe fen; hye droe KOE done) is a pain reliever. It is used to treat mild to moderate pain. This medicine may be used for other purposes; ask your health care provider or pharmacist if you have questions. What should I tell my health care provider before I take this medicine? They need to know if you have any of these conditions: -brain tumor -Crohn's disease, inflammatory bowel disease, or ulcerative colitis -drink more than 3 alcohol-containing drinks per day -drug abuse  or addiction -head injury -heart or circulation problems -kidney disease or problems going to the bathroom -liver disease -lung disease, asthma, or breathing problems -an unusual or allergic reaction to acetaminophen, hydrocodone, other opioid analgesics, other medicines, foods, dyes, or preservatives -pregnant or trying to get pregnant -breast-feeding How should I use this medicine? Take this medicine by mouth. Swallow it with a full glass of water. Follow the directions on the prescription label. If the medicine upsets your stomach, take the medicine with food or milk. Do not take more than you are told to take. Talk to your pediatrician regarding the use of this medicine in children. This medicine is not approved for use in children. Overdosage: If you think you have taken too much of this medicine contact a poison control center or emergency room at once. NOTE: This medicine is only for you. Do not share this medicine with others. What if I miss a  dose? If you miss a dose, take it as soon as you can. If it is almost time for your next dose, take only that dose. Do not take double or extra doses. What may interact with this medicine? -alcohol -antihistamines -isoniazid -medicines for depression, anxiety, or psychotic disturbances -medicines for sleep -muscle relaxants -naltrexone -narcotic medicines (opiates) for pain -phenobarbital -ritonavir -tramadol This list may not describe all possible interactions. Give your health care provider a list of all the medicines, herbs, non-prescription drugs, or dietary supplements you use. Also tell them if you smoke, drink alcohol, or use illegal drugs. Some items may interact with your medicine. What should I watch for while using this medicine? Tell your doctor or health care professional if your pain does not go away, if it gets worse, or if you have new or a different type of pain. You may develop tolerance to the medicine. Tolerance means that you will need a higher dose of the medicine for pain relief. Tolerance is normal and is expected if you take the medicine for a long time. Do not suddenly stop taking your medicine because you may develop a severe reaction. Your body becomes used to the medicine. This does NOT mean you are addicted. Addiction is a behavior related to getting and using a drug for a non-medical reason. If you have pain, you have a medical reason to take pain medicine. Your doctor will tell you how much medicine to take. If your doctor wants you to stop the medicine, the dose will be slowly lowered over time to avoid any side effects. You may get drowsy or dizzy when you first start taking the medicine or change doses. Do not drive, use machinery, or do anything that may be dangerous until you know how the medicine affects you. Stand or sit up slowly. There are different types of narcotic medicines (opiates) for pain. If you take more than one type at the same time, you may have  more side effects. Give your health care provider a list of all medicines you use. Your doctor will tell you how much medicine to take. Do not take more medicine than directed. Call emergency for help if you have problems breathing. The medicine will cause constipation. Try to have a bowel movement at least every 2 to 3 days. If you do not have a bowel movement for 3 days, call your doctor or health care professional. Too much acetaminophen can be very dangerous. Do not take Tylenol (acetaminophen) or medicines that contain acetaminophen with this medicine. Many non-prescription medicines contain acetaminophen.  Always read the labels carefully. What side effects may I notice from receiving this medicine? Side effects that you should report to your doctor or health care professional as soon as possible: -allergic reactions like skin rash, itching or hives, swelling of the face, lips, or tongue -breathing problems -confusion -feeling faint or lightheaded, falls -stomach pain -yellowing of the eyes or skin Side effects that usually do not require medical attention (report to your doctor or health care professional if they continue or are bothersome): -nausea, vomiting -stomach upset This list may not describe all possible side effects. Call your doctor for medical advice about side effects. You may report side effects to FDA at 1-800-FDA-1088. Where should I keep my medicine? Keep out of the reach of children. This medicine can be abused. Keep your medicine in a safe place to protect it from theft. Do not share this medicine with anyone. Selling or giving away this medicine is dangerous and against the law. Store at room temperature between 15 and 30 degrees C (59 and 86 degrees F). Protect from light. Keep container tightly closed.  Throw away any unused medicine after the expiration date. Discard unused medicine and used packaging carefully. Pets and children can be harmed if they find used or lost  packages. NOTE: This sheet is a summary. It may not cover all possible information. If you have questions about this medicine, talk to your doctor, pharmacist, or health care provider.  2013, [employer] Standard. (04/16/2011 1:04:52 PM) Knee Pain The knee is the complex joint between your thigh and your lower leg. It is made up of bones, tendons, ligaments, and cartilage. The bones that make up the knee are:  The femur in the thigh.  The tibia and fibula in the lower leg.  The patella or kneecap riding in the groove on the lower femur. CAUSES  Knee pain is a common complaint with many causes. A few of these causes are:  Injury, such as:  A ruptured ligament or tendon injury.  Torn cartilage.  Medical conditions, such as:  Gout  Arthritis  Infections  Overuse, over training or overdoing a physical activity. Knee pain can be minor or severe. Knee pain can accompany debilitating injury. Minor knee problems often respond well to self-care measures or get well on their own. More serious injuries may need medical intervention or even surgery. SYMPTOMS The knee is complex. Symptoms of knee problems can vary widely. Some of the problems are:  Pain with movement and weight bearing.  Swelling and tenderness.  Buckling of the knee.  Inability to straighten or extend your knee.  Your knee locks and you cannot straighten it.  Warmth and redness with pain and fever.  Deformity or dislocation of the kneecap. DIAGNOSIS  Determining what is wrong may be very straight forward such as when there is an injury. It can also be challenging because of the complexity of the knee. Tests to make a diagnosis may include:  Your caregiver taking a history and doing a physical exam.  Routine X-rays can be used to rule out other problems. X-rays will not reveal a cartilage tear. Some injuries of the knee can be diagnosed by:  Arthroscopy a surgical technique by which a small video camera is  inserted through tiny incisions on the sides of the knee. This procedure is used to examine and repair internal knee joint problems. Tiny instruments can be used during arthroscopy to repair the torn knee cartilage (meniscus).  Arthrography is a radiology technique.  A contrast liquid is directly injected into the knee joint. Internal structures of the knee joint then become visible on X-ray film.  An MRI scan is a non x-ray radiology procedure in which magnetic fields and a computer produce two- or three-dimensional images of the inside of the knee. Cartilage tears are often visible using an MRI scanner. MRI scans have largely replaced arthrography in diagnosing cartilage tears of the knee.  Blood work.  Examination of the fluid that helps to lubricate the knee joint (synovial fluid). This is done by taking a sample out using a needle and a syringe. TREATMENT The treatment of knee problems depends on the cause. Some of these treatments are:  Depending on the injury, proper casting, splinting, surgery or physical therapy care will be needed.  Give yourself adequate recovery time. Do not overuse your joints. If you begin to get sore during workout routines, back off. Slow down or do fewer repetitions.  For repetitive activities such as cycling or running, maintain your strength and nutrition.  Alternate muscle groups. For example if you are a weight lifter, work the upper body on one day and the lower body the next.  Either tight or weak muscles do not give the proper support for your knee. Tight or weak muscles do not absorb the stress placed on the knee joint. Keep the muscles surrounding the knee strong.  Take care of mechanical problems.  If you have flat feet, orthotics or special shoes may help. See your caregiver if you need help.  Arch supports, sometimes with wedges on the inner or outer aspect of the heel, can help. These can shift pressure away from the side of the knee most  bothered by osteoarthritis.  A brace called an "unloader" brace also may be used to help ease the pressure on the most arthritic side of the knee.  If your caregiver has prescribed crutches, braces, wraps or ice, use as directed. The acronym for this is PRICE. This means protection, rest, ice, compression and elevation.  Nonsteroidal anti-inflammatory drugs (NSAID's), can help relieve pain. But if taken immediately after an injury, they may actually increase swelling. Take NSAID's with food in your stomach. Stop them if you develop stomach problems. Do not take these if you have a history of ulcers, stomach pain or bleeding from the bowel. Do not take without your caregiver's approval if you have problems with fluid retention, heart failure, or kidney problems.  For ongoing knee problems, physical therapy may be helpful.  Glucosamine and chondroitin are over-the-counter dietary supplements. Both may help relieve the pain of osteoarthritis in the knee. These medicines are different from the usual anti-inflammatory drugs. Glucosamine may decrease the rate of cartilage destruction.  Injections of a corticosteroid drug into your knee joint may help reduce the symptoms of an arthritis flare-up. They may provide pain relief that lasts a few months. You may have to wait a few months between injections. The injections do have a small increased risk of infection, water retention and elevated blood sugar levels.  Hyaluronic acid injected into damaged joints may ease pain and provide lubrication. These injections may work by reducing inflammation. A series of shots may give relief for as long as 6 months.  Topical painkillers. Applying certain ointments to your skin may help relieve the pain and stiffness of osteoarthritis. Ask your pharmacist for suggestions. Many over the-counter products are approved for temporary relief of arthritis pain.  In some countries, doctors often prescribe topical NSAID's for  relief of chronic conditions such as arthritis and tendinitis. A review of treatment with NSAID creams found that they worked as well as oral medications but without the serious side effects. PREVENTION  Maintain a healthy weight. Extra pounds put more strain on your joints.  Get strong, stay limber. Weak muscles are a common cause of knee injuries. Stretching is important. Include flexibility exercises in your workouts.  Be smart about exercise. If you have osteoarthritis, chronic knee pain or recurring injuries, you may need to change the way you exercise. This does not mean you have to stop being active. If your knees ache after jogging or playing basketball, consider switching to swimming, water aerobics or other low-impact activities, at least for a few days a week. Sometimes limiting high-impact activities will provide relief.  Make sure your shoes fit well. Choose footwear that is right for your sport.  Protect your knees. Use the proper gear for knee-sensitive activities. Use kneepads when playing volleyball or laying carpet. Buckle your seat belt every time you drive. Most shattered kneecaps occur in car accidents.  Rest when you are tired. SEEK MEDICAL CARE IF:  You have knee pain that is continual and does not seem to be getting better.  SEEK IMMEDIATE MEDICAL CARE IF:  Your knee joint feels hot to the touch and you have a high fever. MAKE SURE YOU:   Understand these instructions.  Will watch your condition.  Will get help right away if you are not doing well or get worse. Document Released: 06/03/2007 Document Revised: 10/29/2011 Document Reviewed: 06/03/2007 [facility] Patient Information 2014 [place].

## 2013-03-03 NOTE — Assessment & Plan Note (Signed)
On lasix  -repeat bmet today

## 2013-03-03 NOTE — Assessment & Plan Note (Addendum)
Chronic severe b/l knee pain.  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  Limited improvement with tramadol and ultracet.  Morbidly obese likely contributing to severity of pain.    -trial of vicodin 5-32m x1 month, monitor for sedation along with other medications -counseled extensively on weight loss  -d/c ultracet

## 2013-03-03 NOTE — Progress Notes (Signed)
Subjective:   Patient ID: Beautifull Cisar female   DOB: 04/14/1951 62 y.o.   MRN: [id number]  HPI: Ms.Jenise Cinque is a 62 y.o. morbidly obese African American female with PMH of CHF (last echo 09/2012: severely depressed LVEF but poor study), NICM, HTN, OSA on CPAP, and gout presenting to clinic today for routine follow up visit and medication refills.    In regards to her depression, she feels that it has not improved much and she is concerned it is causing her to gain weight, although her weight has been stable since May and she has lost weight since April 2014 when she was 404lbs.  Today she is 394lbs.  She claims she feels well in the morning but by night time she feels her depression more and she also has trouble sleeping.  She has been struggling with insomnia for quite sometime.  She had success with wellbutrin but unfortunately cannot afford it anymore.  We did discuss transitioning to prozac, which she says she has used in the past that was okay, therefore we will transition from celexa to prozac today and likely titrate up in the upcoming weeks.  Discontinue amitriptyline today. She says she is upset with her weight and doesn't like to look in the mirror and is always in pain due to her knees.  She denies suicidal or homicidal ideation.   She has an appointment with heart failure clinic next week.  She has not seen Dr. Aundra Dubin yet, per daughter and patient, they claim the appointment has not been set up yet.  I encouraged them to follow through with cardiology appointment.  She says she weighs herself daily, but I encouraged her to write down her weights and bring to her next appointment with HF team so we can better assess her volume status.  Today, she continues to have lower extremity edema but improved since before and no change in weight, therefore, will not adjust lasix at this time and will defer to cardiology at this time.  If however her weight increases in the meantime or  shortness of breath, she knows to call and let us all know.   Finally, she continues to complain of b/l knee pain shown to have severe OA on xrays.  Given the limited relief from tramadol and then ultracet, will transition to vicodin trial for 1 month today to see if that helps.  However, she is strongly encouraged to lose weight and show progress with her weight loss as that will likely alleviate much of her pain.   In regards to her insomnia, we will try low dose ambien today and I supplied her with a coupon for affordability.  We will do 1 month trial of 40m to see if that helps and may need to titrate up to 173m     Past Medical History  Diagnosis Date  . Hypertension   . Hyperlipidemia   . Diabetes mellitus   . Depression   . Nonischemic cardiomyopathy   . CHF (congestive heart failure) 2003    EF 30-35% on LV gram, Echo EF 45-50%  . Morbid obesity   . OSA on CPAP   . Gout   . Bilateral knee pain   . Cellulitis of lower leg     left  . H/O: hysterectomy   . Unspecified essential hypertension 03/22/2009    Qualifier: Diagnosis of  By: LaKarrie MeresN, BSN, [name]    . OBSTRUCTIVE SLEEP APNEA 04/07/2009    Qualifier: Diagnosis of  By: Gwenette Greet MD, Armando Reichert   . HYPERLIPIDEMIA-MIXED 05/31/2009    Qualifier: Diagnosis of  By: Aundra Dubin, MD, Dalton    . Gout 10/09/2012  . Acute respiratory failure with hypoxia 09/26/2012  . Chronic combined systolic and diastolic congestive heart failure 10/10/2012    2 D echo 09/2012:  Left ventricle: Extremely poor acoustic windows limit study. Overall LVEF appears to be severely depressed. Recomm ordering limited study with contrast to further evaluate LV systolic function. The cavity size was normal. Wall thickness was normal. Doppler parameters are consistent with abnormal left ventricular relaxation (grade 1 diastolic dysfunction).   2 D echo 03/2009 :  Left ventricle: The cavity size was normal. There was mild concentric hypertrophy. Systolic function was mildly  reduced. The estimated ejection fraction was in the range of 45% to 50%. Diffuse hypokinesis. Doppler parameters are consistent with abnormal left ventricular relaxation (grade 1 diastolic dysfunction).     . Chronic kidney disease 10/10/2012    Stage 2 with GFR of 64   . Malignant hypertension 09/26/2012   Current Outpatient Prescriptions  Medication Sig Dispense Refill  . allopurinol (ZYLOPRIM) 100 MG tablet Take 2 tablets (200 mg total) by mouth daily.  60 tablet  5  . aspirin EC 81 MG EC tablet Take 1 tablet (81 mg total) by mouth daily.  30 tablet  2  . carvedilol (COREG) 3.125 MG tablet Take 2 tablets (6.25 mg total) by mouth 2 (two) times daily with a meal.  60 tablet  1  . cyclobenzaprine (FLEXERIL) 10 MG tablet Take 1 tablet (10 mg total) by mouth 3 (three) times daily as needed.  60 tablet  0  . ferrous fumarate (HEMOCYTE - 106 MG FE) 325 (106 FE) MG TABS Take 1 tablet (106 mg of iron total) by mouth daily.  30 each  3  . furosemide (LASIX) 80 MG tablet Take 1 tablet (80 mg total) by mouth 2 (two) times daily.  60 tablet  2  . lisinopril (PRINIVIL,ZESTRIL) 20 MG tablet Take 1 tablet (20 mg total) by mouth daily.  90 tablet  5  . metFORMIN (GLUCOPHAGE) 500 MG tablet Take 1 tablet (500 mg total) by mouth 2 (two) times daily with a meal.  60 tablet  11  . omega-3 acid ethyl esters (LOVAZA) 1 G capsule Take 2 capsules (2 g total) by mouth 2 (two) times daily.  120 capsule  2  . pravastatin (PRAVACHOL) 40 MG tablet Take 2 tablets (80 mg total) by mouth every evening.  30 tablet  2  . docusate sodium (COLACE) 100 MG capsule Take 1 capsule (100 mg total) by mouth daily as needed for constipation.  30 capsule  5  . FLUoxetine (PROZAC) 20 MG capsule Take 1 capsule (20 mg total) by mouth daily.  30 capsule  1  . HYDROcodone-acetaminophen (NORCO/VICODIN) 5-325 MG per tablet Take 1 tablet by mouth every 6 (six) hours as needed for pain.  120 tablet  0  . zolpidem (AMBIEN) 10 MG tablet Take 0.5 tablets  (5 mg total) by mouth at bedtime as needed for sleep.  30 tablet  0   No current facility-administered medications for this visit.   Family History  Problem Relation Age of Onset  . Asthma Mother   . Asthma Brother   . Asthma Brother   . Heart disease Mother   . Stroke Mother     clotting disorders   History   Social History  . Marital Status: Married  Spouse Name: N/A    Number of Children: 2  . Years of Education: N/A   Occupational History  . retired cna    Social History Main Topics  . Smoking status: Never Smoker   . Smokeless tobacco: None  . Alcohol Use: No  . Drug Use: No  . Sexually Active: No   Other Topics Concern  . None   Social History Narrative  . None   Review of Systems:  Constitutional:  Denies fever, chills, diaphoresis, appetite change and fatigue.   HEENT:  Denies congestion, sore throat, rhinorrhea, sneezing, mouth sores, trouble swallowing, neck pain   Respiratory:  DOE.  Denies SOB, cough, and wheezing.   Cardiovascular:  B/l leg swelling.  Denies chest pain and palpitations.   Gastrointestinal:  Morbidly obese.  Denies nausea, vomiting, abdominal pain, diarrhea, constipation, blood in stool and abdominal distention.   Genitourinary:  Denies dysuria, urgency, frequency, hematuria, flank pain and difficulty urinating.   Musculoskeletal:  B/l knee pain, gait problem.  Denies back pain.  Skin:  Denies pallor, rash and wound.   Neurological/Psych:  Depression.  Denies dizziness, seizures, syncope, weakness, light-headedness, numbness and headaches.    Objective:  Physical Exam: Filed Vitals:   03/03/13 1523 03/03/13 1612  BP: 150/92 144/86  Pulse: 68 66  Temp: 98.2 F (36.8 C)   TempSrc: Oral   Weight: 394 lb 3.2 oz (178.808 kg)   SpO2: 93%    Vitals reviewed. General: sitting in wheelchair, NAD, morbidly obese HEENT: PERRL, EOMI Cardiac: distant heart sounds due to body habitus, RRR, +SEM Pulm: clear to auscultation bilaterally,  no wheezes, rales, or rhonchi Abd: soft, obese, nontender, nondistended, BS present Ext: warm and well perfused, b/l +1 pitting edema, +2DP B/L Neuro: alert and oriented X3, cranial nerves II-XII grossly intact, strength and sensation to light touch equal in bilateral upper and lower extremities  Assessment & Plan:  Discussed with Dr. Ellwood Dense Knee pain: d/c ultracet, trial of vicodin x1 month Insomnia: trial of low dose ambien x1 month CHF: f/u HF clinic next week, continue lasix 80 bid, bmet today Depression: d/c elavil and celexa (max dose due to age), start prozac

## 2013-03-03 NOTE — Assessment & Plan Note (Signed)
Will try low dose ambien x1 month, may need to titrate up to 5m if no success with 568m

## 2013-03-03 NOTE — Assessment & Plan Note (Signed)
BP Readings from Last 3 Encounters:  03/03/13 144/86  01/05/13 128/80  11/25/12 164/102   Lab Results  Component Value Date   NA 142 10/23/2012   K 3.9 10/23/2012   CREATININE 1.15* 10/23/2012   Assessment: Blood pressure control: mildly elevated Progress toward BP goal:  deteriorated Comments: improved on repeat but still elevated to 144/86, possibly elevated more 2/2 pain.    Plan: Medications:  continue current medications: coreg 6.40m bid, lasix 873mbid, lisinopril 2077md Educational resources provided: brochure Self management tools provided: home blood pressure logbook Other plans: f/u HF team, she does not remember if she has been taking 6.61m56m 3.161mg36m.  Reviewed cardiology recommendations again today with caution of HR 60s.

## 2013-03-03 NOTE — Assessment & Plan Note (Signed)
Refilled colace

## 2013-03-04 LAB — BASIC METABOLIC PANEL
BUN: 14 mg/dL (ref 6–23)
Chloride: 103 mEq/L (ref 96–112)
Potassium: 3.9 mEq/L (ref 3.5–5.3)
Sodium: 147 mEq/L — ABNORMAL HIGH (ref 135–145)

## 2013-03-04 NOTE — Progress Notes (Signed)
Case discussed with Dr. Qureshi at the time of the visit.  We reviewed the resident's history and exam and pertinent patient test results.  I agree with the assessment, diagnosis, and plan of care documented in the resident's note. 

## 2013-03-10 ENCOUNTER — Other Ambulatory Visit: Payer: Self-pay | Admitting: *Deleted

## 2013-03-10 DIAGNOSIS — I5042 Chronic combined systolic (congestive) and diastolic (congestive) heart failure: Secondary | ICD-10-CM

## 2013-03-10 MED ORDER — FUROSEMIDE 80 MG PO TABS: 80.0000 mg | ORAL_TABLET | Freq: Two times a day (BID) | ORAL | Status: DC

## 2013-03-10 NOTE — Telephone Encounter (Signed)
She has 3 refills of her iron. Is she even taking it?

## 2013-03-10 NOTE — Telephone Encounter (Signed)
Request if for ferrous sulfate 325 mg NOT ferrous fumarate - does it matter?

## 2013-03-11 NOTE — Telephone Encounter (Signed)
Rx called to [facility] at pt request.

## 2013-03-12 ENCOUNTER — Ambulatory Visit (HOSPITAL_COMMUNITY): Payer: PRIVATE HEALTH INSURANCE

## 2013-03-24 ENCOUNTER — Ambulatory Visit (HOSPITAL_COMMUNITY): Payer: PRIVATE HEALTH INSURANCE

## 2013-03-26 ENCOUNTER — Other Ambulatory Visit: Payer: Self-pay | Admitting: *Deleted

## 2013-03-30 ENCOUNTER — Telehealth: Payer: Self-pay | Admitting: Internal Medicine

## 2013-03-30 NOTE — Telephone Encounter (Signed)
I called Ms. Juba this morning since I received refill request for flexeril.  She has not been prescribed this since February and she says it helps with her occasional leg cramping that she feels coming on.  She says the last dose she took was maybe last month and she tolerates it well.  I informed her that I am not sure if she needs flexeril anymore especially since we are giving her a trial of  Vicodin.  Additionally, due to her anti-depressant medications, there is increased risk of serotonin syndrome with flexeril, therefore, I would not advise that at this time. The cramping may be due to her diuretics and possible electrolyte changes and she needs to follow up in [facility] and with heart failure team.  She has canceled her last two appointments with heart failure team because she says she did not feel well and did not want to get out of bed.  I STRESSED the importance of her keeping these appointments given her heart failure and need for regular follow up.  She is currently rescheduled for September but I encouraged her to call for sooner follow up appointment and she was also to see her cardiologist Dr. Aundra Dubin upon referral from clinic.  She said she will call them today and try to schedule follow up sooner.  We will monitor the leg cramp complaints for now and may consider adding flexeril if absolutely necessary.  She needs to lose weight, she says her weight at home has been in the 370s and she also needs to try to exercise more as tolerated.  Hopefully, this will improve many of her symptoms.  She is in agreement and is asked to call back if she notices worsening of her symptoms or no improvement.

## 2013-03-30 NOTE — Telephone Encounter (Signed)
i am not going to refill this at this time since it has not been refilled since February. i did speak with her on the phone and there is risk of serotonin syndrome with flexeril plus her anti-depressant.

## 2013-03-30 NOTE — Telephone Encounter (Signed)
Does she still need the flexeril since she is now on trial of stronger pain medicine with vicodin?

## 2013-03-30 NOTE — Telephone Encounter (Signed)
She states she does

## 2013-04-22 ENCOUNTER — Other Ambulatory Visit: Payer: Self-pay | Admitting: *Deleted

## 2013-04-22 DIAGNOSIS — E119 Type 2 diabetes mellitus without complications: Secondary | ICD-10-CM

## 2013-04-22 MED ORDER — CARVEDILOL 6.25 MG PO TABS: 6.2500 mg | ORAL_TABLET | Freq: Two times a day (BID) | ORAL | Status: DC

## 2013-04-22 NOTE — Telephone Encounter (Signed)
Hi gayle,  The metformin was just refilled with 11 refills in July.  i did refil the coreg, even though it was last filled by heart failure clinic. I changed it to 6.73m bid tablets instead of the 3.1272mtablets she was on previously. Please let the patient know and she understands that the dose of each tablet has changed so she needs to take 1 tablet twice a day now as compared to the 2 twice a day she was taking before with the other tablets.   Hope that makes sense,   Thanks,  Dr q

## 2013-04-22 NOTE — Telephone Encounter (Signed)
Pt informed and voices understanding

## 2013-04-23 ENCOUNTER — Ambulatory Visit (HOSPITAL_COMMUNITY)
Admission: RE | Admit: 2013-04-23 | Discharge: 2013-04-23 | Disposition: A | Discharge status: Home / Self Care | Payer: PRIVATE HEALTH INSURANCE | Source: Ambulatory Visit | Attending: Internal Medicine | Admitting: Internal Medicine

## 2013-04-23 VITALS — BP 118/88 | HR 72 | Ht 63.0 in | Wt 374.8 lb

## 2013-04-23 DIAGNOSIS — M109 Gout, unspecified: Secondary | ICD-10-CM | POA: Insufficient documentation

## 2013-04-23 DIAGNOSIS — E119 Type 2 diabetes mellitus without complications: Secondary | ICD-10-CM | POA: Insufficient documentation

## 2013-04-23 DIAGNOSIS — Z7982 Long term (current) use of aspirin: Secondary | ICD-10-CM | POA: Insufficient documentation

## 2013-04-23 DIAGNOSIS — I5042 Chronic combined systolic (congestive) and diastolic (congestive) heart failure: Secondary | ICD-10-CM | POA: Insufficient documentation

## 2013-04-23 DIAGNOSIS — I129 Hypertensive chronic kidney disease with stage 1 through stage 4 chronic kidney disease, or unspecified chronic kidney disease: Secondary | ICD-10-CM | POA: Insufficient documentation

## 2013-04-23 DIAGNOSIS — I509 Heart failure, unspecified: Secondary | ICD-10-CM

## 2013-04-23 DIAGNOSIS — Z9119 Patient's noncompliance with other medical treatment and regimen: Secondary | ICD-10-CM | POA: Insufficient documentation

## 2013-04-23 DIAGNOSIS — G4733 Obstructive sleep apnea (adult) (pediatric): Secondary | ICD-10-CM | POA: Insufficient documentation

## 2013-04-23 DIAGNOSIS — Z79899 Other long term (current) drug therapy: Secondary | ICD-10-CM | POA: Insufficient documentation

## 2013-04-23 DIAGNOSIS — F3289 Other specified depressive episodes: Secondary | ICD-10-CM | POA: Insufficient documentation

## 2013-04-23 DIAGNOSIS — N182 Chronic kidney disease, stage 2 (mild): Secondary | ICD-10-CM | POA: Insufficient documentation

## 2013-04-23 DIAGNOSIS — E782 Mixed hyperlipidemia: Secondary | ICD-10-CM | POA: Insufficient documentation

## 2013-04-23 DIAGNOSIS — I428 Other cardiomyopathies: Secondary | ICD-10-CM | POA: Insufficient documentation

## 2013-04-23 DIAGNOSIS — F329 Major depressive disorder, single episode, unspecified: Secondary | ICD-10-CM | POA: Insufficient documentation

## 2013-04-23 DIAGNOSIS — Z91199 Patient's noncompliance with other medical treatment and regimen due to unspecified reason: Secondary | ICD-10-CM | POA: Insufficient documentation

## 2013-04-23 MED ORDER — CARVEDILOL 6.25 MG PO TABS: 9.3750 mg | ORAL_TABLET | Freq: Two times a day (BID) | ORAL | Status: DC

## 2013-04-23 NOTE — Patient Instructions (Addendum)
Take carvedilol 9.375 mg twice a day  Follow up in 1 month  Do the following things EVERYDAY: 1) Weigh yourself in the morning before breakfast. Write it down and keep it in a log. 2) Take your medicines as prescribed 3) Eat low salt foods-Limit salt (sodium) to 2000 mg per day.  4) Stay as active as you can everyday 5) Limit all fluids for the day to less than 2 liters

## 2013-04-23 NOTE — Progress Notes (Signed)
[name], female   DOB: 1950/12/22, 62 y.o.   MRN: [id number] Referring Physician: Dr. Eula Fried Primary Care: Dr Eula Fried Primary Cardiologist: Dr. Aundra Dubin HPI:  Robin Hardin is a 62 y.o. AAF with history of morbid obesity, HTN, and diastolic HF diagnosed in 7106.  She was eventually diagnosed with systolic heart failure by v-gram with an EF 30-35%.  LHC showed no significant coronary disease.  She also has OSA with CPAP.   She had stopped taking her fluid pills in Feb 2014 due to cramping and ended up being admitted with respiratory failure.  She was diuresed ~40 pounds.  Her torsemide was changed to lasix to avoid cramping.  Repeat echo during admission showed 01/05/13  LVEF 25%.    She returns for follow up. She has cancelled the last few appointments. Last visit carvedilol increased to 6.25 mg twice a day. Complains of dyspnea with exertion.+ Orthopnea.  Complains of joint pain. She has not been using CPAP. Weight 368-374 pounds. Compliant with medications. Her daughter sets up her pill box.  Able to perform ADLs.   Labs 03/03/13 K 3.9 Creatinine 0.91     Past Medical History  Diagnosis Date  . Hypertension   . Hyperlipidemia   . Diabetes mellitus   . Depression   . Nonischemic cardiomyopathy   . CHF (congestive heart failure) 2003    EF 30-35% on LV gram, Echo EF 45-50%  . Morbid obesity   . OSA on CPAP   . Gout   . Bilateral knee pain   . Cellulitis of lower leg     left  . H/O: hysterectomy   . Unspecified essential hypertension 03/22/2009    Qualifier: Diagnosis of  By: Karrie Meres RN, BSN, [name]    . OBSTRUCTIVE SLEEP APNEA 04/07/2009    Qualifier: Diagnosis of  By: Gwenette Greet MD, Armando Reichert   . HYPERLIPIDEMIA-MIXED 05/31/2009    Qualifier: Diagnosis of  By: Aundra Dubin, MD, Dalton    . Gout 10/09/2012  . Acute respiratory failure with hypoxia 09/26/2012  . Chronic combined systolic and diastolic congestive heart failure 10/10/2012    2 D echo 09/2012:  Left ventricle:  Extremely poor acoustic windows limit study. Overall LVEF appears to be severely depressed. Recomm ordering limited study with contrast to further evaluate LV systolic function. The cavity size was normal. Wall thickness was normal. Doppler parameters are consistent with abnormal left ventricular relaxation (grade 1 diastolic dysfunction).   2 D echo 03/2009 :  Left ventricle: The cavity size was normal. There was mild concentric hypertrophy. Systolic function was mildly reduced. The estimated ejection fraction was in the range of 45% to 50%. Diffuse hypokinesis. Doppler parameters are consistent with abnormal left ventricular relaxation (grade 1 diastolic dysfunction).     . Chronic kidney disease 10/10/2012    Stage 2 with GFR of 64   . Malignant hypertension 09/26/2012    Current Outpatient Prescriptions  Medication Sig Dispense Refill  . allopurinol (ZYLOPRIM) 100 MG tablet Take 2 tablets (200 mg total) by mouth daily.  60 tablet  5  . aspirin EC 81 MG EC tablet Take 1 tablet (81 mg total) by mouth daily.  30 tablet  2  . carvedilol (COREG) 6.25 MG tablet Take 1 tablet (6.25 mg total) by mouth 2 (two) times daily with a meal.  60 tablet  1  . docusate sodium (COLACE) 100 MG capsule Take 1 capsule (100 mg total) by mouth daily as needed for constipation.  30 capsule  5  . ferrous fumarate (HEMOCYTE - 106 MG FE) 325 (106 FE) MG TABS Take 1 tablet (106 mg of iron total) by mouth daily.  30 each  3  . FLUoxetine (PROZAC) 20 MG capsule Take 1 capsule (20 mg total) by mouth daily.  30 capsule  1  . furosemide (LASIX) 80 MG tablet Take 1 tablet (80 mg total) by mouth 2 (two) times daily.  60 tablet  2  . HYDROcodone-acetaminophen (NORCO/VICODIN) 5-325 MG per tablet Take 1 tablet by mouth every 6 (six) hours as needed for pain.  120 tablet  0  . lisinopril (PRINIVIL,ZESTRIL) 20 MG tablet Take 1 tablet (20 mg total) by mouth daily.  90 tablet  5  . metFORMIN (GLUCOPHAGE) 500 MG tablet Take 1 tablet (500 mg  total) by mouth 2 (two) times daily with a meal.  60 tablet  11  . pravastatin (PRAVACHOL) 40 MG tablet Take 2 tablets (80 mg total) by mouth every evening.  30 tablet  2  . omega-3 acid ethyl esters (LOVAZA) 1 G capsule Take 2 capsules (2 g total) by mouth 2 (two) times daily.  120 capsule  2  . zolpidem (AMBIEN) 10 MG tablet Take 0.5 tablets (5 mg total) by mouth at bedtime as needed for sleep.  30 tablet  0   No current facility-administered medications for this encounter.    No Known Allergies  History   Social History  . Marital Status: Married    Spouse Name: N/A    Number of Children: 2  . Years of Education: N/A   Occupational History  . retired cna    Social History Main Topics  . Smoking status: Never Smoker   . Smokeless tobacco: Not on file  . Alcohol Use: No  . Drug Use: No  . Sexual Activity: No   Other Topics Concern  . Not on file   Social History Narrative  . No narrative on file    Family History  Problem Relation Age of Onset  . Asthma Mother   . Asthma Brother   . Asthma Brother   . Heart disease Mother   . Stroke Mother     clotting disorders    PHYSICAL EXAM: Filed Vitals:   04/23/13 1440  BP: 118/88  Pulse: 72  Height: _0  (1.6 m)  Weight: 374 lb 12.8 oz (170.008 kg)  SpO2: 94%    General:  Markedly obese. Sitting in [place]. No respiratory difficulty HEENT: normal Neck: supple. Unable to clearly see JVP. Carotids 2+ bilat; no bruits. No lymphadenopathy or thryomegaly appreciated. Cor: PMI nonpalpable. Regular rate & rhythm. 2/6 TR. Lungs: clear Abdomen: markedly obese, soft, nontender, nondistended.  No bruits or masses. Good bowel sounds. Extremities: no cyanosis, clubbing, rash, edema Neuro: alert & oriented x 3, cranial nerves grossly intact. moves all 4 extremities w/o difficulty. Affect pleasant.    ASSESSMENT & PLAN: 1. Combined Chronic Systolic and Diastolic heart failure.  ECHO 12/2012 EF 25%. NYHA II-III but difficult to  tell due to immobility. -Last seen in May because she has cancelled appointments.  Volume status stable. Continue 80 mg lasix po bid Increase carvedilol 9.375 mg twice a day. Hopefully can increase to 12.5 mg bid at next visit.  Continue lisinopril 20 mg daily Will need repeat ECHO in 2 months after HF meds titrated  2. OSA- noncompliant with CPAP Encouraged to use CPAP every night.   Reinforced daily weights, low salt food choices, limiting fluid intake to <  2 liters per day  Follow up in 1 month  CLEGG,AMY 4:32 PM  Patient seen and examined with Darrick Grinder, NP. We discussed all aspects of the encounter. I agree with the assessment and plan as stated above. Overall HF relatively stable. Hard to assess functional class due to severe obesity and related functional limitations. Agree with med changes as above. Will need echo to reassess EF once meds stabilized. If EF still < 35% will need to decide on ICD.   Daniel Bensimhon,MD 11:23 PM

## 2013-05-07 ENCOUNTER — Other Ambulatory Visit: Payer: Self-pay | Admitting: *Deleted

## 2013-05-07 MED ORDER — FLUOXETINE HCL 20 MG PO CAPS: 20.0000 mg | ORAL_CAPSULE | Freq: Every day | ORAL | Status: DC

## 2013-05-12 ENCOUNTER — Telehealth: Payer: Self-pay | Admitting: Internal Medicine

## 2013-05-12 ENCOUNTER — Other Ambulatory Visit: Payer: Self-pay | Admitting: *Deleted

## 2013-05-12 MED ORDER — HYDROCODONE-ACETAMINOPHEN 5-325 MG PO TABS: 1.0000 | ORAL_TABLET | Freq: Two times a day (BID) | ORAL | Status: DC | PRN

## 2013-05-12 NOTE — Telephone Encounter (Signed)
Is this from patient or from pharmacy?

## 2013-05-12 NOTE — Telephone Encounter (Signed)
Called to pharm 

## 2013-05-12 NOTE — Telephone Encounter (Signed)
I received a message requesting refill for Robin Hardin' Vicodin. She says she has only 1 pill left and she has been breaking them up in half. The medicine has been helping with her pain and she is only taking 1 pill twice a day as needed. She has lost weight since her prior appointments and did follow up with heart failure clinic. I will prescribe her a limited supply until follow up [facility] appointment with me next month. At that time, we will reassess and if needed refill and/or start pain contract as appropriate.

## 2013-05-19 ENCOUNTER — Other Ambulatory Visit: Payer: Self-pay | Admitting: *Deleted

## 2013-05-19 MED ORDER — PRAVASTATIN SODIUM 40 MG PO TABS: 80.0000 mg | ORAL_TABLET | Freq: Every evening | ORAL | Status: DC

## 2013-05-21 NOTE — Telephone Encounter (Signed)
Called to pharm 

## 2013-05-25 ENCOUNTER — Ambulatory Visit (HOSPITAL_COMMUNITY)
Admission: RE | Admit: 2013-05-25 | Discharge: 2013-05-25 | Disposition: A | Discharge status: Home / Self Care | Payer: PRIVATE HEALTH INSURANCE | Source: Ambulatory Visit | Attending: Cardiology | Admitting: Cardiology

## 2013-05-25 VITALS — BP 126/72 | HR 68 | Wt 357.8 lb

## 2013-05-25 DIAGNOSIS — I509 Heart failure, unspecified: Secondary | ICD-10-CM | POA: Insufficient documentation

## 2013-05-25 DIAGNOSIS — I5042 Chronic combined systolic (congestive) and diastolic (congestive) heart failure: Secondary | ICD-10-CM | POA: Insufficient documentation

## 2013-05-25 MED ORDER — CARVEDILOL 12.5 MG PO TABS: 12.5000 mg | ORAL_TABLET | Freq: Two times a day (BID) | ORAL | Status: DC

## 2013-05-25 MED ORDER — SPIRONOLACTONE 12.5 MG HALF TABLET: 12.5000 mg | ORAL_TABLET | Freq: Every day | ORAL | Status: DC

## 2013-05-25 NOTE — Progress Notes (Signed)
[name], female   DOB: 1951-03-31, 62 y.o.   MRN: [id number]  Referring Physician: Dr. Eula Fried Primary Care: Dr Eula Fried Primary Cardiologist: Dr. Aundra Dubin HPI:  [name] is a 62 y.o. AAF with history of morbid obesity, HTN, and diastolic HF diagnosed in 0865.  She was eventually diagnosed with systolic heart failure by v-gram with an (09/2012) EF approximately 25% (difficult windows).  LHC showed no significant coronary disease.  She also has OSA with CPAP.   She had stopped taking her fluid pills in Feb 2014 due to cramping and ended up being admitted with respiratory failure.  She was diuresed ~40 pounds.  Her torsemide was changed to lasix to avoid cramping.  Repeat echo during admission showed 01/05/13  LVEF 25%. Discharge weight 387 lbs.  Follow up: Last visit increased coreg to 9.375 mg BID. She is working on losing weight and has lost 17 lbs since last visit. She is able to go up and down stairs in her house with no issue. Denies orthopnea, CP.  She does get short of breath walking up the stairs or walking farther than about 100-200 feet. Does not wear CPAP regularly reports it turns off in the middle of the night. Able to perform ADLs. Compliant with medications.   Labs (7/14): K 3.9 Creatinine 0.91, LDL 105  Past Medical History:  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 She was hospitalized at [facility] in [place] where she had a cath showing normal coronaries but EF 30-35% on LV-gram. She has been hospitalized two other times since then in [place] for CHF. Echo (8/10) showed EF 45-50%, mild global HK, mild LVH, grade I diastolic dysfunction, no significant valvular problems.  Echo (2/14) with EF severely decreased (poor windows so not quantified, appears about 25%).   2. HTN  3. Depression  4. Morbid obesity  5. Hyperlipidemia  6. OSA: sporadic with CPAP  7. Gout on allopurinol  8. Bilateral knee pain, probable osteoarthritis  9.  Diabetes type II  10. Left lower leg cellulitis in 10/11 with normal [place] for DVT.   Family History:  No FH of premature CAD  asthma: mother, 2 brothers  heart disease: mother  clotting disorders: mother (stroke)   Social History:  Recently from [place] to [place].  Married with 2 adopted children.  Never smoked, no ETOH or drugs.  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  Review of Systems  All systems reviewed and negative except as per HPI.    Current Outpatient Prescriptions  Medication Sig Dispense Refill  . allopurinol (ZYLOPRIM) 100 MG tablet Take 2 tablets (200 mg total) by mouth daily.  60 tablet  5  . aspirin EC 81 MG EC tablet Take 1 tablet (81 mg total) by mouth daily.  30 tablet  2  . carvedilol (COREG) 6.25 MG tablet Take 1.5 tablets (9.375 mg total) by mouth 2 (two) times daily with a meal.  90 tablet  1  . docusate sodium (COLACE) 100 MG capsule Take 1 capsule (100 mg total) by mouth daily as needed for constipation.  30 capsule  5  . ferrous fumarate (HEMOCYTE - 106 MG FE) 325 (106 FE) MG TABS Take 1 tablet (106 mg of iron total) by mouth daily.  30 each  3  . FLUoxetine (PROZAC) 20 MG capsule Take 1 capsule (20 mg total) by mouth daily.  90 capsule  3  . furosemide (LASIX) 80 MG tablet Take 1  tablet (80 mg total) by mouth 2 (two) times daily.  60 tablet  2  . HYDROcodone-acetaminophen (NORCO/VICODIN) 5-325 MG per tablet Take 1 tablet by mouth every 12 (twelve) hours as needed for pain.  42 tablet  0  . lisinopril (PRINIVIL,ZESTRIL) 20 MG tablet Take 1 tablet (20 mg total) by mouth daily.  90 tablet  5  . metFORMIN (GLUCOPHAGE) 500 MG tablet Take 1 tablet (500 mg total) by mouth 2 (two) times daily with a meal.  60 tablet  11  . omega-3 acid ethyl esters (LOVAZA) 1 G capsule Take 2 capsules (2 g total) by mouth 2 (two) times daily.  120 capsule  2  . pravastatin (PRAVACHOL) 40 MG tablet Take 2 tablets (80 mg total) by mouth every evening.  180 tablet  4   . zolpidem (AMBIEN) 10 MG tablet Take 0.5 tablets (5 mg total) by mouth at bedtime as needed for sleep.  30 tablet  0   No current facility-administered medications for this encounter.    No Known Allergies   There were no vitals filed for this visit.  PHYSICAL EXAM: General:  Markedly obese. Sitting in [place]. No respiratory difficulty, daughter present HEENT: normal Neck: supple. Unable to clearly see JVP but appears flat; Carotids 2+ bilat; no bruits. No lymphadenopathy or thryomegaly appreciated. Cor: PMI nonpalpable. Regular rate & rhythm. 1/6 systolic murmur LLSB. Lungs: clear Abdomen: markedly obese, soft, nontender, nondistended.  No bruits or masses. Good bowel sounds. Extremities: no cyanosis, clubbing, rash. 1+ ankle edema. Neuro: alert & oriented x 3, cranial nerves grossly intact. moves all 4 extremities w/o difficulty. Affect pleasant.  ASSESSMENT & PLAN: 1. Combined Chronic Systolic and Diastolic heart failure.  ECHO 09/2012 EF 25%. NYHA II-III symptoms. Volume status stable and is down 17 lbs since last visit.  - Will continue lasix 80 mg BID. - Will increase coreg to 12.5 mg BID and add spironolactone 12.5 mg daily. BMET and pro-BNP next week. - Continue lisinopril 20 mg daily. - Congratulated patient on weight loss and encouraged her to keep up the great work. - Follow up in 1 month for medication titration. - Will need repeat ECHO after next visit to reassess EF, if remains less than 35% will need to refer for ICD. - Reinforced the need and importance of daily weights, a low sodium diet, and fluid restriction (less than 2 L a day). Instructed to call the HF clinic if weight increases more than 3 lbs overnight or 5 lbs in a week.   2. MPN:TIRWERX she wears her CPAP sometimes at night, however when she wakes up it is off her face.  - She is going to call Dr. Gwenette Greet to discuss mask adjustment or to see if she can get nasal pillows - Stressed importance of wearing CPAP  every night.  3. Obesity - Patient is down 17 lbs, encouraged her to keep up the great work. - Discussed getting enrolled in exercise program and suggested she look into silver sneakers through the [facility].  Junie Bame B 9:38 AM  Patient seen with NP, agree with the above note.  Patient has lost weight and is stable clinically. Will increase Coreg to 12.5 mg bid and add spironolactone 12.5 mg daily.  BMET next week, followup 1 month.  Will get echo in 2 months to reassess EF, ? ICD.   Loralie Champagne 05/25/2013 3:32 PM

## 2013-05-25 NOTE — Patient Instructions (Addendum)
Increase coreg to 12.5 mg twice a day. You can take 2 tabs (6.25 mg) twice a day until you run out of your prescription and then your new pill size will be 12.5 mg.  Start taking 12.5 mg spironolactone daily.  Follow up with labs next week at [facility]) on Monday 06/01/13  Call to get appt with Dr. Gwenette Greet about CPAP machine.  Great job on losing weight.  Follow up 1 month  Do the following things EVERYDAY: 1) Weigh yourself in the morning before breakfast. Write it down and keep it in a log. 2) Take your medicines as prescribed 3) Eat low salt foods-Limit salt (sodium) to 2000 mg per day.  4) Stay as active as you can everyday 5) Limit all fluids for the day to less than 2 liters 6)

## 2013-05-27 ENCOUNTER — Other Ambulatory Visit: Payer: Self-pay | Admitting: *Deleted

## 2013-05-27 MED ORDER — ZOLPIDEM TARTRATE 10 MG PO TABS: 5.0000 mg | ORAL_TABLET | Freq: Every evening | ORAL | Status: DC | PRN

## 2013-05-27 NOTE — Telephone Encounter (Signed)
Is patient requesting this? Or is this automated? She technically should have been out last month.

## 2013-06-01 ENCOUNTER — Other Ambulatory Visit: Payer: PRIVATE HEALTH INSURANCE

## 2013-06-01 NOTE — Telephone Encounter (Signed)
Called to pharm 

## 2013-06-02 ENCOUNTER — Encounter: Payer: Self-pay | Admitting: Internal Medicine

## 2013-06-02 ENCOUNTER — Ambulatory Visit (INDEPENDENT_AMBULATORY_CARE_PROVIDER_SITE_OTHER): Payer: PRIVATE HEALTH INSURANCE | Admitting: Internal Medicine

## 2013-06-02 VITALS — BP 135/84 | HR 58 | Temp 98.6°F | Wt 358.3 lb

## 2013-06-02 DIAGNOSIS — Z6841 Body Mass Index (BMI) 40.0 and over, adult: Secondary | ICD-10-CM

## 2013-06-02 DIAGNOSIS — I5042 Chronic combined systolic (congestive) and diastolic (congestive) heart failure: Secondary | ICD-10-CM

## 2013-06-02 DIAGNOSIS — K029 Dental caries, unspecified: Secondary | ICD-10-CM

## 2013-06-02 DIAGNOSIS — Z23 Encounter for immunization: Secondary | ICD-10-CM

## 2013-06-02 DIAGNOSIS — I509 Heart failure, unspecified: Secondary | ICD-10-CM

## 2013-06-02 DIAGNOSIS — E119 Type 2 diabetes mellitus without complications: Secondary | ICD-10-CM

## 2013-06-02 DIAGNOSIS — F32A Depression, unspecified: Secondary | ICD-10-CM

## 2013-06-02 DIAGNOSIS — I10 Essential (primary) hypertension: Secondary | ICD-10-CM

## 2013-06-02 DIAGNOSIS — M1710 Unilateral primary osteoarthritis, unspecified knee: Secondary | ICD-10-CM

## 2013-06-02 DIAGNOSIS — F329 Major depressive disorder, single episode, unspecified: Secondary | ICD-10-CM

## 2013-06-02 DIAGNOSIS — M179 Osteoarthritis of knee, unspecified: Secondary | ICD-10-CM

## 2013-06-02 LAB — GLUCOSE, CAPILLARY: Glucose-Capillary: 90 mg/dL (ref 70–99)

## 2013-06-02 LAB — POCT GLYCOSYLATED HEMOGLOBIN (HGB A1C): Hemoglobin A1C: 5.7

## 2013-06-02 MED ORDER — HYDROCODONE-ACETAMINOPHEN 5-325 MG PO TABS: 1.0000 | ORAL_TABLET | Freq: Two times a day (BID) | ORAL | Status: DC | PRN

## 2013-06-02 NOTE — Assessment & Plan Note (Signed)
BP Readings from Last 3 Encounters:  06/02/13 135/84  05/25/13 126/72  04/23/13 118/88   Lab Results  Component Value Date   NA 147* 03/03/2013   K 3.9 03/03/2013   CREATININE 0.91 03/03/2013   Assessment: Blood pressure control: controlled Progress toward BP goal:  at goal  Plan: Medications:  continue current medications lasix 39m bid, coreg 12.592mbid, spironolactone 12.78m60md, and lisinopril 46m14m

## 2013-06-02 NOTE — Progress Notes (Signed)
Subjective:   [name] female   DOB: [birth year] 62 y.o.   MRN: [id number]  HPI: [name] is a 62 y.o. morbidly obese African American female with PMH of combined systolic and diastolic heart failure (last echo 09/2012: severely depressed LVEF but poor study), NICM, HTN, and OSA on CPAP, presenting to clinic today for routine follow up visit. I am so proud of [name] for her significant weight loss since July 2014.  She is now down to 358lbs today! Her weight in September 2014 was noted to be 374lbs.  She is also very pleased with her weight-loss and also brought in a calender with her daily weights which shows that for most of October she has been approximately 355lbs.  She reports following a strict diet with mostly vegetables and fruit and less junk and outside food. She is cooking more, has more energy, and feels more comfortable and has a easier time to get around. I have encouraged her to continue her weight loss and exercise.    In regards to her heart failure she reports compliance with all her medications and is able to confirm each medicine and the correct dose with me in clinic today. She was last seen by Dr. Aundra Dubin on 05/25/13 who was also pleased with her progress.  At that time her medication regimen was changed to Lasix 33m BID, Coreg 12.546mBID, and Spironolactone 12.13m27mD along with Lisinopril 40m313m.  She was supposed to return for lab work but was not able to do so and plans to go there right after clinic today today. She is scheduled to see him again next month and will have repeat echo done at that time. I have also encouraged her to continue to follow up in HF clinic as scheduled and as per Dr. McLeAundra Dubin her weight changes ~3lbs overnight or 5 pounds in a week she is to call HF clinic.   Finally, she reports her depression is well controlled with prozac, however, she still has times when she does not feel like doing much. She denies any suicidal or  homicidal ideation. She was started on prozac in July.  She overall does feel like she has more energy now with improved mood but still has her down times. Therefore, she wanted to dry to increase her dose once her current prescription is completed as she still has approximately 2 weeks left and we will likely do so.  She will call when her current prescription is done and report her symptoms and if any improvement or worsening and we will then adjust dose as needed.   She still refuses colonoscopy but will do the stool cards. Flu vaccine to be given today.   Past Medical History  Diagnosis Date  . Hypertension   . Hyperlipidemia   . Diabetes mellitus   . Depression   . Nonischemic cardiomyopathy   . CHF (congestive heart failure) 2003    EF 30-35% on LV gram, Echo EF 45-50%  . Morbid obesity   . OSA on CPAP   . Gout   . Bilateral knee pain   . Cellulitis of lower leg     left  . H/O: hysterectomy   . Unspecified essential hypertension 03/22/2009    Qualifier: Diagnosis of  By: LankKarrie Meres BSN, [name]    . OBSTRUCTIVE SLEEP APNEA 04/07/2009    Qualifier: Diagnosis of  By: ClanGwenette Greet KeitArmando Reichert HYPERLIPIDEMIA-MIXED 05/31/2009    Qualifier: Diagnosis  of  By: Aundra Dubin, MD, Dalton    . Gout 10/09/2012  . Acute respiratory failure with hypoxia 09/26/2012  . Chronic combined systolic and diastolic congestive heart failure 10/10/2012    2 D echo 09/2012:  Left ventricle: Extremely poor acoustic windows limit study. Overall LVEF appears to be severely depressed. Recomm ordering limited study with contrast to further evaluate LV systolic function. The cavity size was normal. Wall thickness was normal. Doppler parameters are consistent with abnormal left ventricular relaxation (grade 1 diastolic dysfunction).   2 D echo 03/2009 :  Left ventricle: The cavity size was normal. There was mild concentric hypertrophy. Systolic function was mildly reduced. The estimated ejection fraction was in the range of 45% to  50%. Diffuse hypokinesis. Doppler parameters are consistent with abnormal left ventricular relaxation (grade 1 diastolic dysfunction).     . Chronic kidney disease 10/10/2012    Stage 2 with GFR of 64   . Malignant hypertension 09/26/2012   Current Outpatient Prescriptions  Medication Sig Dispense Refill  . allopurinol (ZYLOPRIM) 100 MG tablet Take 2 tablets (200 mg total) by mouth daily.  60 tablet  5  . aspirin EC 81 MG EC tablet Take 1 tablet (81 mg total) by mouth daily.  30 tablet  2  . carvedilol (COREG) 12.5 MG tablet Take 1 tablet (12.5 mg total) by mouth 2 (two) times daily with a meal.  60 tablet  6  . docusate sodium (COLACE) 100 MG capsule Take 1 capsule (100 mg total) by mouth daily as needed for constipation.  30 capsule  5  . ferrous fumarate (HEMOCYTE - 106 MG FE) 325 (106 FE) MG TABS Take 1 tablet (106 mg of iron total) by mouth daily.  30 each  3  . FLUoxetine (PROZAC) 20 MG capsule Take 1 capsule (20 mg total) by mouth daily.  90 capsule  3  . furosemide (LASIX) 80 MG tablet Take 1 tablet (80 mg total) by mouth 2 (two) times daily.  60 tablet  2  . HYDROcodone-acetaminophen (NORCO/VICODIN) 5-325 MG per tablet Take 1 tablet by mouth every 12 (twelve) hours as needed for pain.  60 tablet  0  . lisinopril (PRINIVIL,ZESTRIL) 20 MG tablet Take 1 tablet (20 mg total) by mouth daily.  90 tablet  5  . metFORMIN (GLUCOPHAGE) 500 MG tablet Take 1 tablet (500 mg total) by mouth 2 (two) times daily with a meal.  60 tablet  11  . pravastatin (PRAVACHOL) 40 MG tablet Take 2 tablets (80 mg total) by mouth every evening.  180 tablet  4  . spironolactone (ALDACTONE) 12.5 mg TABS tablet Take 0.5 tablets (12.5 mg total) by mouth daily.  30 tablet  6  . zolpidem (AMBIEN) 10 MG tablet Take 0.5 tablets (5 mg total) by mouth at bedtime as needed for sleep.  30 tablet  5  . omega-3 acid ethyl esters (LOVAZA) 1 G capsule Take 2 capsules (2 g total) by mouth 2 (two) times daily.  120 capsule  2   No  current facility-administered medications for this visit.   Family History  Problem Relation Age of Onset  . Asthma Mother   . Asthma Brother   . Asthma Brother   . Heart disease Mother   . Stroke Mother     clotting disorders   History   Social History  . Marital Status: Married    Spouse Name: N/A    Number of Children: 2  . Years of Education: N/A  Occupational History  . retired cna    Social History Main Topics  . Smoking status: Never Smoker   . Smokeless tobacco: None  . Alcohol Use: No  . Drug Use: No  . Sexual Activity: None   Other Topics Concern  . None   Social History Narrative  . None   Review of Systems:  Constitutional:  Denies fever, chills, diaphoresis, appetite change and fatigue.   HEENT:  Poor dentition. Denies congestion, sore throat, rhinorrhea, sneezing, mouth sores, trouble swallowing, neck pain   Respiratory:  Denies SOB, DOE, cough, and wheezing.   Cardiovascular:  Denies chest pain, palpitations, and leg swelling.   Gastrointestinal:  Obese, occasional constipation.  Denies nausea, vomiting, abdominal pain, diarrhea, constipation, blood in stool.   Genitourinary:  Denies dysuria, urgency, frequency, hematuria, flank pain and difficulty urinating.   Musculoskeletal:  Chronic b/l knee pain and back pain.    Skin:  Denies pallor, rash and wound.   Neurological/Psych:  Depression.  Denies dizziness, syncope, light-headedness, numbness and headaches.    Objective:  Physical Exam: Filed Vitals:   06/02/13 1602  BP: 135/84  Pulse: 58  Temp: 98.6 F (37 C)  TempSrc: Oral  Weight: 358 lb 4.8 oz (162.524 kg)  SpO2: 95%   Vitals reviewed. General: sitting in wheelchair, NAD HEENT: PERRL, EOMI, no scleral icterus, poor dentition, missing right front tooth Cardiac: distant heart sounds due to body habitus Pulm: clear to auscultation bilaterally, no wheezes, rales, or rhonchi Abd: soft, obese, nontender, nondistended, BS present Ext: warm  and well perfused, obese extremities with b/l non pitting pedal edema, +2DP B/L, tenderness to palpation of b/l knees and legs Neuro: alert and oriented X3, cranial nerves II-XII grossly intact, strength and sensation to light touch equal in bilateral upper and lower extremities  Assessment & Plan:  Discussed with Dr. Eppie Gibson Chronic Knee pain: Refilled Norco for another month Flu vaccine given today, gave stool cards again

## 2013-06-02 NOTE — Assessment & Plan Note (Signed)
Last seen by Dr. Aundra Dubin 05/25/13.    Continue current regimen: Lasix 42m bid Coreg 12.586mbid Spironolactone 12.5g qd Lisinopril 2088md Continue daily weights and notify HF if significant increase, 5 lbs/week or 3 pounds on day Follow upwith Dr. MclAundra Dubinxt month and with HF clinic, echo next month Needed to leave soon to get labs done at cardiology office that were previously ordered

## 2013-06-02 NOTE — Assessment & Plan Note (Signed)
Stable but still has "down" movements on occasional days but overall more energy and better mood and more activities since on prozac and with weight loss.  Discussed increasing dose to 47m daily.  -will complete current prescription, has ~2 weeks left and then will call to update on symptoms and if still the same will increase to prozac 42mpo qd and monitor

## 2013-06-02 NOTE — Assessment & Plan Note (Signed)
Continues to have weight loss and congratulated her on her accomplishment of being able to do so. Also brought in her daily weights calendar.   -down to 358lbs today, was ~390lbs in July 2014! -continue weight loss and exercise -admits to having more cheat meals in October but plans to get back to her controlled diet

## 2013-06-02 NOTE — Assessment & Plan Note (Signed)
Well controlled with norco. Again, discussed that this will not be a long-term solution and that with her continued weight loss and exercise, symptoms should ignore. Therefore, did not start pain contract today but did refill for another month and will continue to re-evaluate and hopefully transition to milder medication as tolerated.  -refilled norco 5-375m bid prn x30 days -continue weight loss -exercise as tolerated

## 2013-06-02 NOTE — Assessment & Plan Note (Signed)
Poor dentition with teeth falling out and cavities.   -dental referral.

## 2013-06-03 NOTE — Progress Notes (Signed)
Case discussed with Dr. Eula Fried soon after the resident saw the patient.  We reviewed the resident's history and exam and pertinent patient test results.  I agree with the assessment, diagnosis and plan of care documented in the resident's note.

## 2013-06-05 ENCOUNTER — Ambulatory Visit: Payer: PRIVATE HEALTH INSURANCE

## 2013-06-05 ENCOUNTER — Ambulatory Visit: Payer: PRIVATE HEALTH INSURANCE | Admitting: Cardiology

## 2013-06-05 DIAGNOSIS — I10 Essential (primary) hypertension: Secondary | ICD-10-CM

## 2013-06-05 DIAGNOSIS — J811 Chronic pulmonary edema: Secondary | ICD-10-CM

## 2013-06-05 DIAGNOSIS — I5042 Chronic combined systolic (congestive) and diastolic (congestive) heart failure: Secondary | ICD-10-CM

## 2013-06-05 LAB — BASIC METABOLIC PANEL
BUN: 15 mg/dL (ref 6–23)
Chloride: 102 mEq/L (ref 96–112)
Potassium: 3.7 mEq/L (ref 3.5–5.3)
Sodium: 144 mEq/L (ref 135–145)

## 2013-06-05 LAB — BRAIN NATRIURETIC PEPTIDE: Brain Natriuretic Peptide: 56 pg/mL (ref 0.0–100.0)

## 2013-06-25 ENCOUNTER — Ambulatory Visit (HOSPITAL_COMMUNITY)
Admission: RE | Admit: 2013-06-25 | Discharge: 2013-06-25 | Disposition: A | Discharge status: Home / Self Care | Payer: No Typology Code available for payment source | Source: Ambulatory Visit | Attending: Internal Medicine | Admitting: Internal Medicine

## 2013-06-25 ENCOUNTER — Other Ambulatory Visit: Payer: Self-pay

## 2013-06-25 VITALS — BP 130/76 | HR 66 | Wt 353.0 lb

## 2013-06-25 DIAGNOSIS — I5042 Chronic combined systolic (congestive) and diastolic (congestive) heart failure: Secondary | ICD-10-CM | POA: Insufficient documentation

## 2013-06-25 DIAGNOSIS — I509 Heart failure, unspecified: Secondary | ICD-10-CM | POA: Insufficient documentation

## 2013-06-25 DIAGNOSIS — G4733 Obstructive sleep apnea (adult) (pediatric): Secondary | ICD-10-CM | POA: Insufficient documentation

## 2013-06-25 MED ORDER — CARVEDILOL 12.5 MG PO TABS: 18.7500 mg | ORAL_TABLET | Freq: Two times a day (BID) | ORAL | Status: DC

## 2013-06-25 MED ORDER — SPIRONOLACTONE 25 MG PO TABS: 25.0000 mg | ORAL_TABLET | Freq: Every day | ORAL | Status: DC

## 2013-06-25 NOTE — Progress Notes (Signed)
[name], female   DOB: 11/30/1950, 62 y.o.   MRN: [id number]  Referring Physician: Dr. Eula Fried Primary Care: Dr Eula Fried Primary Cardiologist: Dr. Aundra Dubin HPI:  Robin Hardin is a 62 y.o. AAF with history of morbid obesity, HTN, and diastolic HF diagnosed in 9485.  She was eventually diagnosed with systolic heart failure by v-gram with an (09/2012) EF approximately 25% (difficult windows).  LHC showed no significant coronary disease.  She also has OSA with CPAP.   She had stopped taking her fluid pills in Feb 2014 due to cramping and ended up being admitted with respiratory failure.  She was diuresed ~40 pounds.  Her torsemide was changed to lasix to avoid cramping.  Repeat echo during admission showed 01/05/13  LVEF 25%. Discharge weight 387 lbs.  She returns for follow up. Last visit carvedilol was increased to 12.5 mg bid and spironolactone 12.5 mg daily was added. Weight at home 347-350 pounds. Breathing better going up steps. Able to ambulate in her house. Still uses electric scooter in the grocery store. Denies orthopnea, CP. She has not used CPAP says she needs another mask. Able to perform ADLs.  Compliant with medications.   Labs (7/14): K 3.9 Creatinine 0.91, LDL 105 Labs (06/05/13): K 3.7 Creatinine 0.87 Pro BNP 56   Past Medical History:  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 She was hospitalized at [facility] in [place] where she had a cath showing normal coronaries but EF 30-35% on LV-gram. She has been hospitalized two other times since then in [place] for CHF. Echo (8/10) showed EF 45-50%, mild global HK, mild LVH, grade I diastolic dysfunction, no significant valvular problems.  Echo (2/14) with EF severely decreased (poor windows so not quantified, appears about 25%).   2. HTN  3. Depression  4. Morbid obesity  5. Hyperlipidemia  6. OSA: sporadic with CPAP  7. Gout on allopurinol  8. Bilateral knee pain, probable osteoarthritis   9. Diabetes type II  10. Left lower leg cellulitis in 10/11 with normal [place] for DVT.   Family History:  No FH of premature CAD  asthma: mother, 2 brothers  heart disease: mother  clotting disorders: mother (stroke)   Social History:  Recently from [place] to [place].  Married with 2 adopted children.  Never smoked, no ETOH or drugs.  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  Review of Systems  All systems reviewed and negative except as per HPI.    Current Outpatient Prescriptions  Medication Sig Dispense Refill  . allopurinol (ZYLOPRIM) 100 MG tablet Take 2 tablets (200 mg total) by mouth daily.  60 tablet  5  . aspirin EC 81 MG EC tablet Take 1 tablet (81 mg total) by mouth daily.  30 tablet  2  . carvedilol (COREG) 12.5 MG tablet Take 1 tablet (12.5 mg total) by mouth 2 (two) times daily with a meal.  60 tablet  6  . docusate sodium (COLACE) 100 MG capsule Take 1 capsule (100 mg total) by mouth daily as needed for constipation.  30 capsule  5  . ferrous fumarate (HEMOCYTE - 106 MG FE) 325 (106 FE) MG TABS Take 1 tablet (106 mg of iron total) by mouth daily.  30 each  3  . FLUoxetine (PROZAC) 20 MG capsule Take 1 capsule (20 mg total) by mouth daily.  90 capsule  3  . furosemide (LASIX) 80 MG tablet Take 1 tablet (80 mg total) by mouth  2 (two) times daily.  60 tablet  2  . HYDROcodone-acetaminophen (NORCO/VICODIN) 5-325 MG per tablet Take 1 tablet by mouth every 12 (twelve) hours as needed for pain.  60 tablet  0  . lisinopril (PRINIVIL,ZESTRIL) 20 MG tablet Take 1 tablet (20 mg total) by mouth daily.  90 tablet  5  . metFORMIN (GLUCOPHAGE) 500 MG tablet Take 1 tablet (500 mg total) by mouth 2 (two) times daily with a meal.  60 tablet  11  . omega-3 acid ethyl esters (LOVAZA) 1 G capsule Take 2 capsules (2 g total) by mouth 2 (two) times daily.  120 capsule  2  . pravastatin (PRAVACHOL) 40 MG tablet Take 2 tablets (80 mg total) by mouth every evening.  180 tablet  4   . spironolactone (ALDACTONE) 12.5 mg TABS tablet Take 0.5 tablets (12.5 mg total) by mouth daily.  30 tablet  6  . zolpidem (AMBIEN) 10 MG tablet Take 0.5 tablets (5 mg total) by mouth at bedtime as needed for sleep.  30 tablet  5   No current facility-administered medications for this encounter.    No Known Allergies   Filed Vitals:   06/25/13 1351  BP: 130/76  Pulse: 66  Weight: 353 lb (160.12 kg)  SpO2: 95%    PHYSICAL EXAM: General:  Markedly obese. Sitting in [place]. No respiratory difficulty, daughter present HEENT: normal Neck: supple. Unable to clearly see JVP but appears flat; Carotids 2+ bilat; no bruits. No lymphadenopathy or thryomegaly appreciated. Cor: PMI nonpalpable. Regular rate & rhythm. 1/6 systolic murmur LLSB. Lungs: clear Abdomen: markedly obese, soft, nontender, nondistended.  No bruits or masses. Good bowel sounds. Extremities: no cyanosis, clubbing, rash. Trace edema. Neuro: alert & oriented x 3, cranial nerves grossly intact. moves all 4 extremities w/o difficulty. Affect pleasant.  ASSESSMENT & PLAN: 1. Combined Chronic Systolic and Diastolic heart failure.  ECHO 09/2012 EF 25%. NYHA II-III symptoms. Volume status stable and is down another 5 lbs since last visit.  - Will continue lasix 80 mg BID. Increase spironolactone to 25 mg daily - Will increase coreg to 18.75  mg BID  - Continue lisinopril 20 mg daily -Check BMET next week. - - Follow up in 1 month for ECHO  to reassess EF, if remains less than 35% will need to refer for ICD. - Reinforced the need and importance of daily weights, a low sodium diet, and fluid restriction (less than 2 L a day). Instructed to call the HF clinic if weight increases more than 3 lbs overnight or 5 lbs in a week.   2. OSA: Not using CPAP.  - Instructed to call  Dr. Gwenette Greet to discuss mask adjustment or to see if she can get nasal pillows - Stressed importance of wearing CPAP every night.  3. Obesity - Weight continues  to trend down. - She declines silver sneakers.   Follow up in 1 month with an ECHO.   Nellene Courtois NP-C 2:02 PM

## 2013-06-25 NOTE — Patient Instructions (Signed)
Follow up in 1 month with an ECHO  Take carvedilol 18.75 mg twice a day  Take spironolactone 25 mg daily  Do the following things EVERYDAY: 1) Weigh yourself in the morning before breakfast. Write it down and keep it in a log. 2) Take your medicines as prescribed 3) Eat low salt foods-Limit salt (sodium) to 2000 mg per day.  4) Stay as active as you can everyday 5) Limit all fluids for the day to less than 2 liters

## 2013-07-02 ENCOUNTER — Other Ambulatory Visit: Payer: No Typology Code available for payment source

## 2013-07-02 ENCOUNTER — Other Ambulatory Visit: Payer: Self-pay | Admitting: *Deleted

## 2013-07-02 DIAGNOSIS — I5042 Chronic combined systolic (congestive) and diastolic (congestive) heart failure: Secondary | ICD-10-CM

## 2013-07-02 MED ORDER — FUROSEMIDE 80 MG PO TABS: 80.0000 mg | ORAL_TABLET | Freq: Two times a day (BID) | ORAL | Status: DC

## 2013-07-02 MED ORDER — FUROSEMIDE 40 MG PO TABS: 80.0000 mg | ORAL_TABLET | Freq: Two times a day (BID) | ORAL | Status: DC

## 2013-07-02 NOTE — Telephone Encounter (Signed)
Request is from [facility] for furosemide 40 mg take 2 tablets 2 times a day # 120

## 2013-07-03 ENCOUNTER — Other Ambulatory Visit: Payer: Self-pay | Admitting: *Deleted

## 2013-07-03 MED ORDER — FERROUS FUMARATE 325 (106 FE) MG PO TABS: 1.0000 | ORAL_TABLET | Freq: Every day | ORAL | Status: DC

## 2013-07-03 NOTE — Telephone Encounter (Signed)
Samaya - - Ms. Eaker has had a normal H/H for the last few checks (one low) with normal MCV.  Last check of iron studies was 2011.  Let's recheck her iron stores and make sure she still needs this medication going forward when you next see her.   Thanks!  Bonnita Nasuti - - I have refilled as a phone in.  Let me know if that doesn't work.   EBM

## 2013-07-27 ENCOUNTER — Other Ambulatory Visit: Payer: Self-pay | Admitting: *Deleted

## 2013-07-27 DIAGNOSIS — M1710 Unilateral primary osteoarthritis, unspecified knee: Secondary | ICD-10-CM

## 2013-07-27 DIAGNOSIS — M179 Osteoarthritis of knee, unspecified: Secondary | ICD-10-CM

## 2013-07-28 MED ORDER — HYDROCODONE-ACETAMINOPHEN 5-325 MG PO TABS: 1.0000 | ORAL_TABLET | Freq: Two times a day (BID) | ORAL | Status: DC | PRN

## 2013-07-28 NOTE — Telephone Encounter (Signed)
Hydrocodone rx ready for pick up - pt called.

## 2013-07-29 ENCOUNTER — Ambulatory Visit (HOSPITAL_BASED_OUTPATIENT_CLINIC_OR_DEPARTMENT_OTHER)
Admission: RE | Admit: 2013-07-29 | Discharge: 2013-07-29 | Disposition: A | Discharge status: Home / Self Care | Payer: No Typology Code available for payment source | Source: Ambulatory Visit | Attending: Internal Medicine | Admitting: Internal Medicine

## 2013-07-29 ENCOUNTER — Ambulatory Visit (HOSPITAL_COMMUNITY)
Admission: RE | Admit: 2013-07-29 | Discharge: 2013-07-29 | Disposition: A | Discharge status: Home / Self Care | Payer: No Typology Code available for payment source | Source: Ambulatory Visit | Attending: Internal Medicine | Admitting: Internal Medicine

## 2013-07-29 VITALS — BP 112/72 | HR 60 | Wt 345.0 lb

## 2013-07-29 DIAGNOSIS — I428 Other cardiomyopathies: Secondary | ICD-10-CM | POA: Insufficient documentation

## 2013-07-29 DIAGNOSIS — Z6841 Body Mass Index (BMI) 40.0 and over, adult: Secondary | ICD-10-CM

## 2013-07-29 DIAGNOSIS — E785 Hyperlipidemia, unspecified: Secondary | ICD-10-CM | POA: Insufficient documentation

## 2013-07-29 DIAGNOSIS — F3289 Other specified depressive episodes: Secondary | ICD-10-CM | POA: Insufficient documentation

## 2013-07-29 DIAGNOSIS — G4733 Obstructive sleep apnea (adult) (pediatric): Secondary | ICD-10-CM

## 2013-07-29 DIAGNOSIS — I10 Essential (primary) hypertension: Secondary | ICD-10-CM | POA: Insufficient documentation

## 2013-07-29 DIAGNOSIS — Z79899 Other long term (current) drug therapy: Secondary | ICD-10-CM | POA: Insufficient documentation

## 2013-07-29 DIAGNOSIS — E119 Type 2 diabetes mellitus without complications: Secondary | ICD-10-CM | POA: Insufficient documentation

## 2013-07-29 DIAGNOSIS — I509 Heart failure, unspecified: Secondary | ICD-10-CM | POA: Insufficient documentation

## 2013-07-29 DIAGNOSIS — F329 Major depressive disorder, single episode, unspecified: Secondary | ICD-10-CM | POA: Insufficient documentation

## 2013-07-29 DIAGNOSIS — I5042 Chronic combined systolic (congestive) and diastolic (congestive) heart failure: Secondary | ICD-10-CM

## 2013-07-29 DIAGNOSIS — I517 Cardiomegaly: Secondary | ICD-10-CM

## 2013-07-29 DIAGNOSIS — Z7982 Long term (current) use of aspirin: Secondary | ICD-10-CM | POA: Insufficient documentation

## 2013-07-29 DIAGNOSIS — M109 Gout, unspecified: Secondary | ICD-10-CM | POA: Insufficient documentation

## 2013-07-29 LAB — BASIC METABOLIC PANEL
BUN: 16 mg/dL (ref 6–23)
Calcium: 9.4 mg/dL (ref 8.4–10.5)
GFR calc Af Amer: 69 mL/min — ABNORMAL LOW (ref >90)
GFR calc non Af Amer: 60 mL/min — ABNORMAL LOW (ref >90)
Glucose, Bld: 97 mg/dL (ref 70–99)
Potassium: 3.4 mEq/L — ABNORMAL LOW (ref 3.5–5.1)
Sodium: 143 mEq/L (ref 135–145)

## 2013-07-29 NOTE — Progress Notes (Signed)
Patient ID: Genowefa Morga, female   DOB: 1951-03-01, 62 y.o.   MRN: [id number]  Referring Physician: Dr. Eula Fried Primary Care: Dr Eula Fried Primary Cardiologist: Dr. Aundra Dubin HPI:  Ms. Calia is a 62 y.o. AAF with history of morbid obesity, HTN, and diastolic HF diagnosed in 7408.  She was eventually diagnosed with systolic heart failure by v-gram (09/2012) with an EF approximately 25%.  LHC showed no significant coronary disease.  She also has OSA with CPAP.   She had stopped taking her fluid pills in Feb 2014 due to cramping and ended up being admitted with respiratory failure.  She was diuresed ~40 pounds.  Her torsemide was changed to lasix to avoid cramping.  Repeat echo during admission showed 01/05/13  LVEF 25%. Discharge weight 387 lbs.  She returns for follow up. Last visit carvedilol was increased to 18.75 mg bid and spironolactone was increased to 25 mg daily. Weight at home 338-342 pounds. Able to ambulate in her house. Uses electric scooter in the grocery store. She says breathing is better. Denies orthopnea, CP. Complains of knee pain. She is not using CPAP. Able to perform ADLs.  Compliant with medications.   Labs (7/14): K 3.9 Creatinine 0.91, LDL 105 Labs (06/05/13): K 3.7 Creatinine 0.87 Pro BNP 56   Past Medical History:  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 She was hospitalized at [facility] in [place] where she had a cath showing normal coronaries but EF 30-35% on LV-gram. She has been hospitalized two other times since then in [place] for CHF. Echo (8/10) showed EF 45-50%, mild global HK, mild LVH, grade I diastolic dysfunction, no significant valvular problems.  Echo (2/14) with EF severely decreased (poor windows so not quantified, appears about 25%).   2. HTN  3. Depression  4. Morbid obesity  5. Hyperlipidemia  6. OSA: sporadic with CPAP  7. Gout on allopurinol  8. Bilateral knee pain, probable osteoarthritis  9. Diabetes type II  10.  Left lower leg cellulitis in 10/11 with normal [place] for DVT.   Family History:  No FH of premature CAD  asthma: mother, 2 brothers  heart disease: mother  clotting disorders: mother (stroke)   Social History:  Recently from [place] to [place].  Married with 2 adopted children.  Never smoked, no ETOH or drugs.  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  Review of Systems  All systems reviewed and negative except as per HPI.    Current Outpatient Prescriptions  Medication Sig Dispense Refill  . allopurinol (ZYLOPRIM) 100 MG tablet Take 2 tablets (200 mg total) by mouth daily.  60 tablet  5  . aspirin EC 81 MG EC tablet Take 1 tablet (81 mg total) by mouth daily.  30 tablet  2  . carvedilol (COREG) 12.5 MG tablet Take 1.5 tablets (18.75 mg total) by mouth 2 (two) times daily with a meal.  90 tablet  6  . docusate sodium (COLACE) 100 MG capsule Take 1 capsule (100 mg total) by mouth daily as needed for constipation.  30 capsule  5  . ferrous fumarate (HEMOCYTE - 106 MG FE) 325 (106 FE) MG TABS tablet Take 1 tablet (106 mg of iron total) by mouth daily.  60 each  0  . FLUoxetine (PROZAC) 20 MG capsule Take 1 capsule (20 mg total) by mouth daily.  90 capsule  3  . furosemide (LASIX) 40 MG tablet Take 2 tablets (80 mg total) by mouth 2 (two)  times daily.  120 tablet  2  . HYDROcodone-acetaminophen (NORCO/VICODIN) 5-325 MG per tablet Take 1 tablet by mouth every 12 (twelve) hours as needed.  60 tablet  0  . lisinopril (PRINIVIL,ZESTRIL) 20 MG tablet Take 1 tablet (20 mg total) by mouth daily.  90 tablet  5  . metFORMIN (GLUCOPHAGE) 500 MG tablet Take 1 tablet (500 mg total) by mouth 2 (two) times daily with a meal.  60 tablet  11  . omega-3 acid ethyl esters (LOVAZA) 1 G capsule Take 2 capsules (2 g total) by mouth 2 (two) times daily.  120 capsule  2  . pravastatin (PRAVACHOL) 40 MG tablet Take 2 tablets (80 mg total) by mouth every evening.  180 tablet  4  . spironolactone  (ALDACTONE) 25 MG tablet Take 1 tablet (25 mg total) by mouth daily.  30 tablet  6  . zolpidem (AMBIEN) 10 MG tablet Take 0.5 tablets (5 mg total) by mouth at bedtime as needed for sleep.  30 tablet  5   No current facility-administered medications for this encounter.    No Known Allergies   Filed Vitals:   07/29/13 1455  BP: 112/72  Pulse: 60  Weight: 345 lb (156.491 kg)  SpO2: 96%    PHYSICAL EXAM: General:  Markedly obese. Sitting in [place]. No respiratory difficulty, daughter present HEENT: normal Neck: supple. Unable to clearly see JVP but appears flat; Carotids 2+ bilat; no bruits. No lymphadenopathy or thryomegaly appreciated. Cor: PMI nonpalpable. Regular rate & rhythm. 1/6 systolic murmur LLSB. Lungs: clear Abdomen: markedly obese, soft, nontender, nondistended.  No bruits or masses. Good bowel sounds. Extremities: no cyanosis, clubbing, rash, edema. Neuro: alert & oriented x 3, cranial nerves grossly intact. moves all 4 extremities w/o difficulty. Affect pleasant.  ASSESSMENT & PLAN: 1. Combined Chronic Systolic and Diastolic heart failure.  ECHO 09/2012 EF 25% today Dr Haroldine Laws discussed and reviewed ECHO EF now ~40-45% RV normal. No need for ICD. NYHA II symptoms. Volume status stable she continues to trend down. She is down another  8 lbs.  .   - Will continue lasix 80 mg BID and spironolactone to 25 mg daily -  Continue coreg to 18.75  mg BID  - Continue lisinopril 20 mg daily -Check BMET  -- Reinforced the need and importance of daily weights, a low sodium diet, and fluid restriction (less than 2 L a day). Instructed to call the HF clinic if weight increases more than 3 lbs overnight or 5 lbs in a week.   2. OSA: Not using CPAP. Stressed importance of wearing CPAP every night.- I have asked her again to call Dr  Gwenette Greet to discuss mask adjustment or to see if she can get nasal pillows  3. Obesity - Weight continues to trend down. - She declines silver sneakers.    4. HTN - Blood pressure well controlled. Continue current regimen.   Follow up 3 months   CLEGG,AMY NP-C 3:00 PM  Patient seen and examined with Darrick Grinder, NP. We discussed all aspects of the encounter. I agree with the assessment and plan as stated above.   She continues to improve. Weight is coming down. BP well controlled. Echo reviewed personally in clinic and EF up to 40-45%. Will continue current regimen. Reinforced need for daily weights and reviewed use of sliding scale diuretics. Stressed importance of contacting Dr. Gwenette Greet to get her CPAP optimized so she can use it.  Alyzza Andringa,MD 5:06 PM

## 2013-07-29 NOTE — Patient Instructions (Signed)
Follow up in 3 months   Do the following things EVERYDAY: 1) Weigh yourself in the morning before breakfast. Write it down and keep it in a log. 2) Take your medicines as prescribed 3) Eat low salt foods-Limit salt (sodium) to 2000 mg per day.  4) Stay as active as you can everyday 5) Limit all fluids for the day to less than 2 liters

## 2013-07-30 ENCOUNTER — Institutional Professional Consult (permissible substitution): Payer: No Typology Code available for payment source | Admitting: Pulmonary Disease

## 2013-08-25 ENCOUNTER — Ambulatory Visit (INDEPENDENT_AMBULATORY_CARE_PROVIDER_SITE_OTHER): Payer: No Typology Code available for payment source | Admitting: Internal Medicine

## 2013-08-25 ENCOUNTER — Encounter: Payer: Self-pay | Admitting: Internal Medicine

## 2013-08-25 VITALS — BP 124/80 | HR 69 | Temp 97.4°F | Ht 63.0 in | Wt 347.5 lb

## 2013-08-25 DIAGNOSIS — M179 Osteoarthritis of knee, unspecified: Secondary | ICD-10-CM

## 2013-08-25 DIAGNOSIS — IMO0002 Reserved for concepts with insufficient information to code with codable children: Secondary | ICD-10-CM

## 2013-08-25 DIAGNOSIS — G4733 Obstructive sleep apnea (adult) (pediatric): Secondary | ICD-10-CM

## 2013-08-25 DIAGNOSIS — M1710 Unilateral primary osteoarthritis, unspecified knee: Secondary | ICD-10-CM

## 2013-08-25 DIAGNOSIS — R5381 Other malaise: Secondary | ICD-10-CM

## 2013-08-25 DIAGNOSIS — F3289 Other specified depressive episodes: Secondary | ICD-10-CM

## 2013-08-25 DIAGNOSIS — R748 Abnormal levels of other serum enzymes: Secondary | ICD-10-CM

## 2013-08-25 DIAGNOSIS — R7989 Other specified abnormal findings of blood chemistry: Secondary | ICD-10-CM

## 2013-08-25 DIAGNOSIS — E790 Hyperuricemia without signs of inflammatory arthritis and tophaceous disease: Secondary | ICD-10-CM | POA: Insufficient documentation

## 2013-08-25 DIAGNOSIS — I10 Essential (primary) hypertension: Secondary | ICD-10-CM

## 2013-08-25 DIAGNOSIS — D649 Anemia, unspecified: Secondary | ICD-10-CM

## 2013-08-25 DIAGNOSIS — R5383 Other fatigue: Secondary | ICD-10-CM

## 2013-08-25 DIAGNOSIS — I509 Heart failure, unspecified: Secondary | ICD-10-CM

## 2013-08-25 DIAGNOSIS — Z6841 Body Mass Index (BMI) 40.0 and over, adult: Secondary | ICD-10-CM

## 2013-08-25 DIAGNOSIS — M109 Gout, unspecified: Secondary | ICD-10-CM

## 2013-08-25 DIAGNOSIS — F32A Depression, unspecified: Secondary | ICD-10-CM

## 2013-08-25 DIAGNOSIS — I5042 Chronic combined systolic (congestive) and diastolic (congestive) heart failure: Secondary | ICD-10-CM

## 2013-08-25 DIAGNOSIS — Z0000 Encounter for general adult medical examination without abnormal findings: Secondary | ICD-10-CM

## 2013-08-25 DIAGNOSIS — F329 Major depressive disorder, single episode, unspecified: Secondary | ICD-10-CM

## 2013-08-25 DIAGNOSIS — E119 Type 2 diabetes mellitus without complications: Secondary | ICD-10-CM

## 2013-08-25 LAB — GLUCOSE, CAPILLARY: GLUCOSE-CAPILLARY: 94 mg/dL (ref 70–99)

## 2013-08-25 LAB — HM DIABETES EYE EXAM

## 2013-08-25 LAB — POCT GLYCOSYLATED HEMOGLOBIN (HGB A1C): HEMOGLOBIN A1C: 5.4

## 2013-08-25 MED ORDER — ALLOPURINOL 300 MG PO TABS: 300.0000 mg | ORAL_TABLET | Freq: Every day | ORAL | Status: DC

## 2013-08-25 MED ORDER — ALLOPURINOL 100 MG PO TABS: 300.0000 mg | ORAL_TABLET | Freq: Every day | ORAL | Status: DC

## 2013-08-25 MED ORDER — FLUOXETINE HCL 40 MG PO CAPS: 40.0000 mg | ORAL_CAPSULE | Freq: Every day | ORAL | Status: DC

## 2013-08-25 MED ORDER — HYDROCODONE-ACETAMINOPHEN 5-325 MG PO TABS: 1.0000 | ORAL_TABLET | Freq: Two times a day (BID) | ORAL | Status: DC | PRN

## 2013-08-25 NOTE — Assessment & Plan Note (Signed)
Non-compliant with CPAP. Urged adherence. Has appointment she claims with Dr. Gwenette Greet later this month for sleep evaluation

## 2013-08-25 NOTE — Addendum Note (Signed)
Addended by: Truddie Crumble on: 08/25/2013 04:22 PM   Modules accepted: Orders

## 2013-08-25 NOTE — Assessment & Plan Note (Signed)
Continues to lose weight! Down to 347lb's today

## 2013-08-25 NOTE — Assessment & Plan Note (Signed)
Feb 2014: 9.5 Goal <6  Has been on allopurinol 28m daily, but will increase to 304mand recheck on next visit

## 2013-08-25 NOTE — Patient Instructions (Addendum)
Please pick up your refill for the spironolactone and start taking it again.   Please follow up with heart failure clinic as scheduled   GREAT JOB on losing weight!! Keep up the good work  We have refilled your pain medicine today  We have increased your gout medicine today, to allopurinol 331m daily  We have also increased your prozac to 458mdaily  We are checking your iron levels today and will adjust your medication as needed  Please get your eye exam done and send back the stool cards once completed  PLEASE TRY TO WALK MORE AND GET SOME EXERCISE AS MUCH AS TOLERATED  Please follow up in 3 months

## 2013-08-25 NOTE — Assessment & Plan Note (Signed)
Refuses colonoscopy despite counseling and discussion about importance of screening.  -recheck A1C, eye exam in clinic today -stool cards since she refuses colonoscopy  Urine microalbumin/cr ratio

## 2013-08-25 NOTE — Progress Notes (Signed)
Subjective:   [name] female   DOB: June 02, 1951 63 y.o.   MRN: [id number]  HPI: RobinLinna Hardin is a 63 y.o. morbidly obese African American female with PMH of combined systolic and diastolic heart failure (last echo 07/2013 much improved with EF 40-45% with diffuse hypokinesis and moderate LVH), NICM, HTN, and OSA on CPAP, presenting to clinic today for routine follow up visit. She continues to lose weight which is wonderful and is down to 347lbs today! She admits to getting a little off her diet during the holidays and is back on it now. She has been very compliant with her heart failure appointments and her medications with the exception of not taking her spironolactone for the past few days because she has not been able to get to the pharmacy. Otherwise, she brought in all her medications today so we were able to review, which is very helpful.    She reports improvement in her knee pain with the Norco, however, says in the past when she took her husband's oxycodone, she felt completely better and able to do very well and move around well.  I had her stand and walk around in clinic today, and she had minimal pain and was able to walk without assistance.  Therefore, for now, we will keep with the current dose of Norco and continue to reassess.  Hopefully, with continued weight-loss this will continue to improve.   Finally, she reports not much improvement in her depression and would like to now increase the dose as we had discussed previously.  She reports decreased energy and interest and increased stress, especially with her sick husband at home who she says is very grumpy at times.  She does express decreased sex drive for the past 2-3 months, which she describes as being "numb down there".  She says she only has sex once or twice a month, but she occasionally has orgasms but the feeling goes away very quickly. As a result, she has less of a sex drive and is wondering if it  is from the prozac.  While this may certainly be a possibility, it may also be that her depression is not yet controlled.  She also was previously on wellbutrin as an alternative, but cannot afford it anymore and she says that did not control her depression well.  She has also tried zoloft in the past and said that made her "swell all over".  Therefore, we will increase the dose of prozac today, to see if that helps with the depression and as a result maybe help with her sex drive. If however, her depression improves but sex drive goes down further then it may be more due to the medication and we can adjust accordingly. She is in agreement for increasing the dose at this time.   Her diabetes has been very well controlled over the years and she on metformin bid.  We discussed maybe coming off the metformin but she feels it helps her with her weight and since she has been doing so well with her weight-loss she does not want to mess any of that up.  Therefore, we will continue the metformin and check another HbA1C today.  She will also get her eye exam today as well.   [name] continues to refuse colonoscopy despite our discussion.  She says especially while she is feeling depressed she does not want it right now and will maybe consider it later. I urged the importance of colon  cancer screening and she did agree to at least doing the stool cards which we have provided for her again today.   Past Medical History  Diagnosis Date  . Hypertension   . Hyperlipidemia   . Diabetes mellitus   . Depression   . Nonischemic cardiomyopathy   . CHF (congestive heart failure) 2003    EF 30-35% on LV gram, Echo EF 45-50%  . Morbid obesity   . OSA on CPAP   . Gout   . Bilateral knee pain   . Cellulitis of lower leg     left  . H/O: hysterectomy   . Unspecified essential hypertension 03/22/2009    Qualifier: Diagnosis of  By: Karrie Meres RN, BSN, [name]    . OBSTRUCTIVE SLEEP APNEA 04/07/2009    Qualifier:  Diagnosis of  By: Gwenette Greet MD, Armando Reichert   . HYPERLIPIDEMIA-MIXED 05/31/2009    Qualifier: Diagnosis of  By: Aundra Dubin, MD, Dalton    . Gout 10/09/2012  . Acute respiratory failure with hypoxia 09/26/2012  . Chronic combined systolic and diastolic congestive heart failure 10/10/2012    2 D echo 09/2012:  Left ventricle: Extremely poor acoustic windows limit study. Overall LVEF appears to be severely depressed. Recomm ordering limited study with contrast to further evaluate LV systolic function. The cavity size was normal. Wall thickness was normal. Doppler parameters are consistent with abnormal left ventricular relaxation (grade 1 diastolic dysfunction).   2 D echo 03/2009 :  Left ventricle: The cavity size was normal. There was mild concentric hypertrophy. Systolic function was mildly reduced. The estimated ejection fraction was in the range of 45% to 50%. Diffuse hypokinesis. Doppler parameters are consistent with abnormal left ventricular relaxation (grade 1 diastolic dysfunction).     . Chronic kidney disease 10/10/2012    Stage 2 with GFR of 64   . Malignant hypertension 09/26/2012   Current Outpatient Prescriptions  Medication Sig Dispense Refill  . allopurinol (ZYLOPRIM) 300 MG tablet Take 1 tablet (300 mg total) by mouth daily.  30 tablet  5  . aspirin EC 81 MG EC tablet Take 1 tablet (81 mg total) by mouth daily.  30 tablet  2  . carvedilol (COREG) 12.5 MG tablet Take 1.5 tablets (18.75 mg total) by mouth 2 (two) times daily with a meal.  90 tablet  6  . docusate sodium (COLACE) 100 MG capsule Take 1 capsule (100 mg total) by mouth daily as needed for constipation.  30 capsule  5  . ferrous sulfate 325 (65 FE) MG tablet Take 325 mg by mouth daily with breakfast.      . FLUoxetine (PROZAC) 40 MG capsule Take 1 capsule (40 mg total) by mouth daily.  30 capsule  3  . furosemide (LASIX) 40 MG tablet Take 2 tablets (80 mg total) by mouth 2 (two) times daily.  120 tablet  2  . HYDROcodone-acetaminophen  (NORCO/VICODIN) 5-325 MG per tablet Take 1 tablet by mouth every 12 (twelve) hours as needed.  60 tablet  0  . lisinopril (PRINIVIL,ZESTRIL) 20 MG tablet Take 1 tablet (20 mg total) by mouth daily.  90 tablet  5  . metFORMIN (GLUCOPHAGE) 500 MG tablet Take 1 tablet (500 mg total) by mouth 2 (two) times daily with a meal.  60 tablet  11  . pravastatin (PRAVACHOL) 40 MG tablet Take 2 tablets (80 mg total) by mouth every evening.  180 tablet  4  . spironolactone (ALDACTONE) 25 MG tablet Take 1 tablet (25 mg total) by  mouth daily.  30 tablet  6  . zolpidem (AMBIEN) 10 MG tablet Take 0.5 tablets (5 mg total) by mouth at bedtime as needed for sleep.  30 tablet  5   No current facility-administered medications for this visit.   Family History  Problem Relation Age of Onset  . Asthma Mother   . Asthma Brother   . Asthma Brother   . Heart disease Mother   . Stroke Mother     clotting disorders   History   Social History  . Marital Status: Married    Spouse Name: N/A    Number of Children: 2  . Years of Education: N/A   Occupational History  . retired cna    Social History Main Topics  . Smoking status: Never Smoker   . Smokeless tobacco: None  . Alcohol Use: No  . Drug Use: No  . Sexual Activity: None   Other Topics Concern  . None   Social History Narrative  . None   Review of Systems:  Constitutional:  Fatigue. Denies fever, chills, diaphoresis, appetite change.   HEENT:  Denies congestion, sore throat, rhinorrhea, sneezing, mouth sores, trouble swallowing, neck pain   Respiratory:  DOE. Denies SOB, cough, and wheezing.   Cardiovascular:  Leg swelling. Denies chest pain, palpitations.  Gastrointestinal:  Denies nausea, vomiting, abdominal pain, diarrhea, constipation.n.   Genitourinary:  Denies dysuria.   Musculoskeletal:  B/l knee pain and chronic back pain.   Skin:  Denies pallor, rash and wound.   Neurological/Psych:  Weakness, depression, decreased energy. Denies  dizziness, syncope.     Objective:  Physical Exam: Filed Vitals:   08/25/13 1325  BP: 124/80  Pulse: 69  Temp: 97.4 F (36.3 C)  Height: _0  (1.6 m)  Weight: 347 lb 8 oz (157.625 kg)  SpO2: 93%   Vitals reviewed. General: sitting in wheelchair, NAD HEENT: PERRL, EOMI Cardiac: distant heart sounds due to body habitus but RRR Pulm: clear to auscultation bilaterally, no wheezes, rales, or rhonchi Abd: soft, obese, nontender, nondistended, BS present Ext: warm and well perfused, mild lower extremity edema b/l Neuro: alert and oriented X3, cranial nerves II-XII grossly intact, strength equal in bilateral upper and lower extremities  Assessment & Plan:  Discussed with Dr. Eppie Gibson -bmet, iron panel, vit D -increased prozac dose -refilled norco -increased allopurinol to 325m qd -eye exam today, a1c, stool cards, urine microalbumin:cr

## 2013-08-25 NOTE — Assessment & Plan Note (Addendum)
Remains not well controlled. Decreased energy and interest. Low sex drive P9-1 months.   -will increase prozac to 91m and re-evaluate in 3 months. May need to change due to decreased sex drive but will be difficult due to financial restraints.  -will check vitamin d, increase that may be contributing to fatigue

## 2013-08-25 NOTE — Assessment & Plan Note (Signed)
BP Readings from Last 3 Encounters:  08/25/13 124/80  07/29/13 112/72  06/25/13 130/76   Lab Results  Component Value Date   NA 143 07/29/2013   K 3.4* 07/29/2013   CREATININE 0.99 07/29/2013   Assessment: Blood pressure control: controlled Progress toward BP goal:  at goal  Plan: Medications:  continue current medications lisinopril 62m, coreg 18.771mbid, lasix 803mid, and spironolactone 50m64m Educational resources provided: brochure

## 2013-08-25 NOTE — Assessment & Plan Note (Addendum)
Compliant with medications most of the time but has been out of her spironolactone since approximately January 2nd, but will pick up refill today.  -follow up HF clinic March -continue to lose weight -recheck bmet, since last one showed hypokalemia and has not been taking spironolactone

## 2013-08-25 NOTE — Assessment & Plan Note (Signed)
Refilled norco today. She reports success with oxycodone in the past but does report improvement in pain with the norco and is able to walk more with decreased assistance and less pain.

## 2013-08-26 LAB — BASIC METABOLIC PANEL
BUN: 17 mg/dL (ref 6–23)
CO2: 29 mEq/L (ref 19–32)
CREATININE: 0.9 mg/dL (ref 0.50–1.10)
Calcium: 9 mg/dL (ref 8.4–10.5)
Chloride: 101 mEq/L (ref 96–112)
GLUCOSE: 90 mg/dL (ref 70–99)
Potassium: 4 mEq/L (ref 3.5–5.3)
Sodium: 144 mEq/L (ref 135–145)

## 2013-08-26 LAB — IRON AND TIBC
%SAT: 18 % — ABNORMAL LOW (ref 20–55)
Iron: 56 ug/dL (ref 42–145)
TIBC: 320 ug/dL (ref 250–470)
UIBC: 264 ug/dL (ref 125–400)

## 2013-08-26 LAB — FERRITIN: Ferritin: 58 ng/mL (ref 10–291)

## 2013-08-26 LAB — MICROALBUMIN / CREATININE URINE RATIO
Creatinine, Urine: 278.2 mg/dL
MICROALB/CREAT RATIO: 4.5 mg/g (ref 0.0–30.0)
Microalb, Ur: 1.24 mg/dL (ref 0.00–1.89)

## 2013-08-26 LAB — VITAMIN D 25 HYDROXY (VIT D DEFICIENCY, FRACTURES): Vit D, 25-Hydroxy: 30 ng/mL (ref 30–89)

## 2013-09-08 ENCOUNTER — Institutional Professional Consult (permissible substitution): Payer: No Typology Code available for payment source | Admitting: Pulmonary Disease

## 2013-09-11 ENCOUNTER — Other Ambulatory Visit: Payer: Self-pay | Admitting: *Deleted

## 2013-09-11 DIAGNOSIS — I5042 Chronic combined systolic (congestive) and diastolic (congestive) heart failure: Secondary | ICD-10-CM

## 2013-09-11 MED ORDER — FUROSEMIDE 40 MG PO TABS: 80.0000 mg | ORAL_TABLET | Freq: Two times a day (BID) | ORAL | Status: DC

## 2013-09-11 NOTE — Telephone Encounter (Signed)
rx faxed in

## 2013-09-12 NOTE — Assessment & Plan Note (Signed)
Feb 2014 uric acid level: 9.5 Goal <6  Has been on allopurinol 229m daily, but will increase to 3040mand recheck on next visit

## 2013-09-12 NOTE — Progress Notes (Signed)
Case discussed with Dr. Eula Fried at time of visit.  We reviewed the resident's history and exam and pertinent patient test results.  I agree with the assessment, diagnosis, and plan of care documented in the resident's note.  Please note that we are increasing the dose of allopurinol because the patient has a history of gout, not asymptomatic elevation of the uric acid level.

## 2013-09-16 ENCOUNTER — Ambulatory Visit: Payer: No Typology Code available for payment source

## 2013-10-05 ENCOUNTER — Institutional Professional Consult (permissible substitution): Payer: Self-pay | Admitting: Pulmonary Disease

## 2013-10-06 ENCOUNTER — Encounter: Payer: Self-pay | Admitting: Internal Medicine

## 2013-10-07 ENCOUNTER — Telehealth: Payer: Self-pay | Admitting: *Deleted

## 2013-10-07 NOTE — Telephone Encounter (Signed)
Called pt and explained that at her income level she does not qualify for county pharmacy, will need to use a retail pharmacy for all her meds

## 2013-10-13 ENCOUNTER — Institutional Professional Consult (permissible substitution): Payer: Self-pay | Admitting: Pulmonary Disease

## 2013-10-18 ENCOUNTER — Encounter: Payer: Self-pay | Admitting: Internal Medicine

## 2013-10-18 ENCOUNTER — Other Ambulatory Visit: Payer: Self-pay | Admitting: Internal Medicine

## 2013-10-19 ENCOUNTER — Other Ambulatory Visit: Payer: Self-pay | Admitting: *Deleted

## 2013-10-19 DIAGNOSIS — M179 Osteoarthritis of knee, unspecified: Secondary | ICD-10-CM

## 2013-10-19 DIAGNOSIS — I5042 Chronic combined systolic (congestive) and diastolic (congestive) heart failure: Secondary | ICD-10-CM

## 2013-10-19 DIAGNOSIS — M1710 Unilateral primary osteoarthritis, unspecified knee: Secondary | ICD-10-CM

## 2013-10-19 MED ORDER — HYDROCODONE-ACETAMINOPHEN 5-325 MG PO TABS: 1.0000 | ORAL_TABLET | Freq: Two times a day (BID) | ORAL | Status: DC | PRN

## 2013-10-19 MED ORDER — FUROSEMIDE 80 MG PO TABS: 80.0000 mg | ORAL_TABLET | Freq: Two times a day (BID) | ORAL | Status: DC

## 2013-10-19 NOTE — Telephone Encounter (Signed)
i thought she did not have insurance?

## 2013-10-19 NOTE — Telephone Encounter (Signed)
Pt insurance wants 3 month supply of furosemide

## 2013-10-19 NOTE — Telephone Encounter (Signed)
I changed to lasix 24m tablets bid instead of the 425mtablets. Please let her know sine the number of tablets she takes will be less

## 2013-10-20 NOTE — Telephone Encounter (Signed)
Attempted to call pt no vmail no answer

## 2013-10-21 ENCOUNTER — Other Ambulatory Visit: Payer: Self-pay | Admitting: *Deleted

## 2013-10-21 DIAGNOSIS — D649 Anemia, unspecified: Secondary | ICD-10-CM

## 2013-10-21 DIAGNOSIS — E119 Type 2 diabetes mellitus without complications: Secondary | ICD-10-CM

## 2013-10-21 DIAGNOSIS — K5900 Constipation, unspecified: Secondary | ICD-10-CM

## 2013-10-21 MED ORDER — DOCUSATE SODIUM 100 MG PO CAPS: 100.0000 mg | ORAL_CAPSULE | Freq: Every day | ORAL | Status: DC | PRN

## 2013-10-21 MED ORDER — PRAVASTATIN SODIUM 40 MG PO TABS: 80.0000 mg | ORAL_TABLET | Freq: Every evening | ORAL | Status: DC

## 2013-10-21 MED ORDER — METFORMIN HCL 500 MG PO TABS: 500.0000 mg | ORAL_TABLET | Freq: Two times a day (BID) | ORAL | Status: DC

## 2013-11-10 ENCOUNTER — Institutional Professional Consult (permissible substitution): Payer: Self-pay | Admitting: Pulmonary Disease

## 2013-11-16 ENCOUNTER — Encounter: Payer: Self-pay | Admitting: Internal Medicine

## 2013-11-16 ENCOUNTER — Other Ambulatory Visit: Payer: Self-pay | Admitting: *Deleted

## 2013-11-16 DIAGNOSIS — D649 Anemia, unspecified: Secondary | ICD-10-CM

## 2013-11-16 MED ORDER — FERROUS SULFATE 325 (65 FE) MG PO TABS: 325.0000 mg | ORAL_TABLET | Freq: Every day | ORAL | Status: DC

## 2013-11-24 ENCOUNTER — Ambulatory Visit (INDEPENDENT_AMBULATORY_CARE_PROVIDER_SITE_OTHER): Payer: Self-pay | Admitting: Internal Medicine

## 2013-11-24 ENCOUNTER — Encounter: Payer: Self-pay | Admitting: Internal Medicine

## 2013-11-24 VITALS — BP 112/77 | HR 59 | Temp 98.3°F | Ht 63.0 in | Wt 340.7 lb

## 2013-11-24 DIAGNOSIS — M109 Gout, unspecified: Secondary | ICD-10-CM

## 2013-11-24 DIAGNOSIS — IMO0002 Reserved for concepts with insufficient information to code with codable children: Secondary | ICD-10-CM

## 2013-11-24 DIAGNOSIS — M1710 Unilateral primary osteoarthritis, unspecified knee: Secondary | ICD-10-CM

## 2013-11-24 DIAGNOSIS — I10 Essential (primary) hypertension: Secondary | ICD-10-CM

## 2013-11-24 DIAGNOSIS — F3289 Other specified depressive episodes: Secondary | ICD-10-CM

## 2013-11-24 DIAGNOSIS — F329 Major depressive disorder, single episode, unspecified: Secondary | ICD-10-CM

## 2013-11-24 DIAGNOSIS — Z6841 Body Mass Index (BMI) 40.0 and over, adult: Secondary | ICD-10-CM

## 2013-11-24 DIAGNOSIS — I5042 Chronic combined systolic (congestive) and diastolic (congestive) heart failure: Secondary | ICD-10-CM

## 2013-11-24 DIAGNOSIS — M179 Osteoarthritis of knee, unspecified: Secondary | ICD-10-CM

## 2013-11-24 DIAGNOSIS — F5231 Female orgasmic disorder: Secondary | ICD-10-CM

## 2013-11-24 DIAGNOSIS — M1990 Unspecified osteoarthritis, unspecified site: Secondary | ICD-10-CM

## 2013-11-24 DIAGNOSIS — F32A Depression, unspecified: Secondary | ICD-10-CM

## 2013-11-24 DIAGNOSIS — I509 Heart failure, unspecified: Secondary | ICD-10-CM

## 2013-11-24 DIAGNOSIS — G4733 Obstructive sleep apnea (adult) (pediatric): Secondary | ICD-10-CM

## 2013-11-24 MED ORDER — LISINOPRIL 20 MG PO TABS: 20.0000 mg | ORAL_TABLET | Freq: Every day | ORAL | Status: DC

## 2013-11-24 MED ORDER — ALLOPURINOL 300 MG PO TABS: 300.0000 mg | ORAL_TABLET | Freq: Every day | ORAL | Status: DC

## 2013-11-24 MED ORDER — HYDROCODONE-ACETAMINOPHEN 5-325 MG PO TABS: 1.0000 | ORAL_TABLET | Freq: Two times a day (BID) | ORAL | Status: DC | PRN

## 2013-11-24 NOTE — Patient Instructions (Signed)
We will increase your prozac to 48m for one week, and then if needed increase to 862m Please call me when your tablets are done and we will prescribe the new dose.   Please follow up with GYN.

## 2013-11-26 ENCOUNTER — Ambulatory Visit (HOSPITAL_COMMUNITY)
Admission: RE | Admit: 2013-11-26 | Discharge: 2013-11-26 | Disposition: A | Discharge status: Home / Self Care | Payer: Self-pay | Source: Ambulatory Visit | Attending: Internal Medicine | Admitting: Internal Medicine

## 2013-11-26 VITALS — BP 126/66 | HR 61 | Wt 339.5 lb

## 2013-11-26 DIAGNOSIS — I428 Other cardiomyopathies: Secondary | ICD-10-CM | POA: Insufficient documentation

## 2013-11-26 DIAGNOSIS — E119 Type 2 diabetes mellitus without complications: Secondary | ICD-10-CM | POA: Insufficient documentation

## 2013-11-26 DIAGNOSIS — G4733 Obstructive sleep apnea (adult) (pediatric): Secondary | ICD-10-CM

## 2013-11-26 DIAGNOSIS — I10 Essential (primary) hypertension: Secondary | ICD-10-CM | POA: Insufficient documentation

## 2013-11-26 DIAGNOSIS — Z7982 Long term (current) use of aspirin: Secondary | ICD-10-CM | POA: Insufficient documentation

## 2013-11-26 DIAGNOSIS — I5022 Chronic systolic (congestive) heart failure: Secondary | ICD-10-CM | POA: Insufficient documentation

## 2013-11-26 DIAGNOSIS — E785 Hyperlipidemia, unspecified: Secondary | ICD-10-CM | POA: Insufficient documentation

## 2013-11-26 DIAGNOSIS — I5042 Chronic combined systolic (congestive) and diastolic (congestive) heart failure: Secondary | ICD-10-CM

## 2013-11-26 DIAGNOSIS — I509 Heart failure, unspecified: Secondary | ICD-10-CM | POA: Insufficient documentation

## 2013-11-26 LAB — BASIC METABOLIC PANEL
BUN: 28 mg/dL — AB (ref 6–23)
CALCIUM: 9.2 mg/dL (ref 8.4–10.5)
CO2: 25 meq/L (ref 19–32)
CREATININE: 1 mg/dL (ref 0.50–1.10)
Chloride: 102 mEq/L (ref 96–112)
GFR calc Af Amer: 68 mL/min — ABNORMAL LOW (ref >90)
GFR calc non Af Amer: 59 mL/min — ABNORMAL LOW (ref >90)
Glucose, Bld: 119 mg/dL — ABNORMAL HIGH (ref 70–99)
Potassium: 3.5 mEq/L — ABNORMAL LOW (ref 3.7–5.3)
Sodium: 146 mEq/L (ref 137–147)

## 2013-11-26 LAB — PRO B NATRIURETIC PEPTIDE: PRO B NATRI PEPTIDE: 259.5 pg/mL — AB (ref 0–125)

## 2013-11-26 MED ORDER — CARVEDILOL 25 MG PO TABS: 25.0000 mg | ORAL_TABLET | Freq: Two times a day (BID) | ORAL | Status: DC

## 2013-11-26 NOTE — Patient Instructions (Signed)
Increase Carvedilol to 25 mg Twice daily, you can take 2 of your tabs until you run out then pick up new prescription for 25 mg tabs  We will contact [facility] regarding your CPAP mask  Labs today  We will contact you in 3 months to schedule your next appointment.

## 2013-11-28 NOTE — Progress Notes (Signed)
[name], female   DOB: 1951/01/11, 63 y.o.   MRN: [id number]  Referring Physician: Dr. Eula Fried Primary Care: Dr Eula Fried Primary Cardiologist: Dr. Aundra Dubin HPI:  Robin Hardin is a 63 y.o. AAF with history of morbid obesity, HTN, and diastolic HF diagnosed in 3532.  She was eventually diagnosed with systolic heart failure by v-gram (09/2012) with an EF approximately 25%.  LHC showed no significant coronary disease.  She also has OSA with CPAP.   She had stopped taking her fluid pills in Feb 2014 due to cramping and ended up being admitted with respiratory failure.  She was diuresed ~40 pounds.  Her torsemide was changed to lasix to avoid cramping.  Repeat echo during admission showed 01/05/13  LVEF 25%. Discharge weight 387 lbs. Echo in 12/14 showed EF 40-45% with mild LV dilation.   She returns for follow up. Weight is down 6 lbs.  She is able to walk in her house without dyspnea.  She is limited by knee and back pain.  She uses a wheelchair when she leaves the house.  No chest pain.  No orthopnea.  + Fatigue.  She is not using CPAP.   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 She was hospitalized at [facility] in [place] where she had a cath showing normal coronaries but EF 30-35% on LV-gram. She has been hospitalized two other times since then in [place] for CHF. Echo (8/10) showed EF 45-50%, mild global HK, mild LVH, grade I diastolic dysfunction, no significant valvular problems.  Echo (2/14) with EF severely decreased (poor windows so not quantified, appears about 25%).  Echo (12/14) with EF 40-45%, mild LV dilation, moderate LVH, moderate LAE.  2. HTN  3. Depression  4. Morbid obesity  5. Hyperlipidemia  6. OSA: sporadic with CPAP  7. Gout on allopurinol  8. Bilateral knee pain, probable  osteoarthritis  9. Diabetes type II  10. Left lower leg cellulitis in 10/11 with normal [place] for DVT.   Family History:  No FH of premature CAD  asthma: mother, 2 brothers  heart disease: mother  clotting disorders: mother (stroke)   Social History:  Recently from [place] to [place].  Married with 2 adopted children.  Never smoked, no ETOH or drugs.  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   Review of Systems  All systems reviewed and negative except as per HPI.    Current Outpatient Prescriptions  Medication Sig Dispense Refill  . allopurinol (ZYLOPRIM) 300 MG tablet Take 1 tablet (300 mg total) by mouth daily.  30 tablet  5  . aspirin EC 81 MG EC tablet Take 1 tablet (81 mg total) by mouth daily.  30 tablet  2  . carvedilol (COREG) 25 MG tablet Take 1 tablet (25 mg total) by mouth 2 (two) times daily with a meal.  60 tablet  6  . docusate sodium (COLACE) 100 MG capsule Take 1 capsule (100 mg total) by mouth daily as needed.  30 capsule  5  . ferrous sulfate 325 (65 FE) MG tablet Take 1 tablet (325 mg total) by mouth daily with breakfast.  90 tablet  3  . FLUoxetine (PROZAC) 40 MG capsule Take 20 mg by mouth daily.      . furosemide (LASIX) 80 MG tablet Take  1 tablet (80 mg total) by mouth 2 (two) times daily.  180 tablet  2  . HYDROcodone-acetaminophen (NORCO/VICODIN) 5-325 MG per tablet Take 1 tablet by mouth every 12 (twelve) hours as needed.  60 tablet  0  . lisinopril (PRINIVIL,ZESTRIL) 20 MG tablet Take 1 tablet (20 mg total) by mouth daily.  90 tablet  5  . metFORMIN (GLUCOPHAGE) 500 MG tablet Take 1 tablet (500 mg total) by mouth 2 (two) times daily with a meal.  60 tablet  11  . pravastatin (PRAVACHOL) 40 MG tablet Take 2 tablets (80 mg total) by mouth every evening.  180 tablet  4  . spironolactone (ALDACTONE) 25 MG tablet Take 1 tablet (25 mg total) by mouth daily.  30 tablet  6  . zolpidem (AMBIEN) 10 MG tablet Take 0.5 tablets (5 mg total) by mouth at  bedtime as needed for sleep.  30 tablet  5   No current facility-administered medications for this encounter.    No Known Allergies   Filed Vitals:   11/26/13 1443  BP: 126/66  Pulse: 61  Weight: 339 lb 8 oz (153.996 kg)  SpO2: 95%    PHYSICAL EXAM: General:  Markedly obese. Sitting in [place]. No respiratory difficulty, daughter present HEENT: normal Neck: supple. Unable to clearly see JVP but appears flat; Carotids 2+ bilat; no bruits. No lymphadenopathy or thryomegaly appreciated. Cor: PMI nonpalpable. Regular rate & rhythm. 1/6 systolic murmur LLSB. Lungs: clear Abdomen: markedly obese, soft, nontender, nondistended.  No bruits or masses. Good bowel sounds. Extremities: no cyanosis, clubbing, rash, edema. Neuro: alert & oriented x 3, cranial nerves grossly intact. moves all 4 extremities w/o difficulty. Affect pleasant.  ASSESSMENT & PLAN: 1. Chronic systolic CHF: Last echo 94/50 with EF 40-45%. Out of range for ICD. NYHA II symptoms but very limited by back and knee pain. Weight down 6 lbs.  She is not volume overloaded on exam.   -  Will continue lasix 80 mg BID and spironolactone 25 mg daily -  Increase Coreg to 25 mg bid.  -  Continue lisinopril 20 mg daily -  Check BMET/BNP  -  Reinforced the need and importance of daily weights, a low sodium diet, and fluid restriction (less than 2 L a day). Instructed to call the HF clinic if weight increases more than 3 lbs overnight or 5 lbs in a week.  2. OSA: Not using CPAP. I urged her to start using it.  Will try to help her get a new mask from [facility].  3. Obesity: Weight continues to trend down but she has a long way to go.  Elby Showers McLean,MD 11/28/2013

## 2013-11-30 DIAGNOSIS — F5231 Female orgasmic disorder: Secondary | ICD-10-CM | POA: Insufficient documentation

## 2013-11-30 NOTE — Assessment & Plan Note (Signed)
-  continues to lose weight, continued efforts encouraged

## 2013-11-30 NOTE — Progress Notes (Signed)
Subjective:   [name] female   DOB: 1950-11-22 63 y.o.   MRN: [id number]  HPI: Robin Hardin is a 63 y.o. morbidly obese African American female with PMH of combined heart failure (last echo 07/2013 much improved with EF 40-45% with diffuse hypokinesis and moderate LVH), NICM, HTN, and OSA (non compliant with CPAP) presenting to [facility] today for routine visit and medication refills.  She continues to lose weight, is down to 340lb's today.  Her depression is much improved since starting prozac, however, she still has moments when she does not feel like doing anything, low energy, and lately has noticed inability to have an orgasm when having sex with her husband.  Our main discussion today >45 minutes was in regards to her marriage and her sex life.  It seems that [name]' husband is an ESRD on HD patient who had a penile pump placed several years ago. However, for the past several years, he claims it is not working well and does not wish to have sex often. She is very shy when talking abut the subject but did open up during our discussion. She says he claims he cannot have an erection nor can he orgasm anymore, however, she claims when they went to have it re-evaluated by his physician, the pump was noted to be working correctly. She then goes into further details in regards to his behavior. She feels as if he is not attracted to her but that when he see's other women, he seems to be interested and she wonders if he gets sexual pleasure elsewhere or through masturbation.  This in turn makes her very angry and she does not feel attracted to him as well. She says he lies often and therefore counseling will not work because he lies right away and the problems never get solved.  They are not very open about what they both seem to like in the bedroom and she says when he is touching her, he "does not do it right".  She still appears to have her sex drive intact, however, she says she  cannot orgasm and feels "numb down there" after a short while.  When she was taking wellbutrin in the past, she was able to have orgasm's however, since starting prozac she thinks this has started.  Fortunately, the prozac has controlled her depression better she claims and is also more affordable and she has been able to lose weight, and thus she is hesitant to stop the prozac.  It is possible that her symptoms are a side-effect of prozac, however, it is also possible that there is a large mental component to this as well. She says it is against her religion to masturbate, and thus she cannot tell if she can orgasm if stimulated the "correct way".    In regards to her heart failure, she is compliant with her medications and has an upcoming follow up appointment.  She does not regularly use her CPAP which has been stressed to her in almost every appointment. I have again stressed the importance of compliance, especially given her complaints of lack of energy and feeling tired. Hopefully, she will improve with this.   Her knee pain is improved with vicodin, however, she is asking for a stronger dose or change to oxycodone (has tried in the past that gave her a lot of energy) but she was also not as heavy during that time and her depression was not as severe. Of note, vicodin could also  be contributing to her sexual dysfunction.   Past Medical History  Diagnosis Date  . Hypertension   . Hyperlipidemia   . Diabetes mellitus   . Depression   . Nonischemic cardiomyopathy   . CHF (congestive heart failure) 2003    EF 30-35% on LV gram, Echo EF 45-50%  . Morbid obesity   . OSA on CPAP   . Gout   . Bilateral knee pain   . Cellulitis of lower leg     left  . H/O: hysterectomy   . Unspecified essential hypertension 03/22/2009    Qualifier: Diagnosis of  By: Karrie Meres RN, BSN, [name]    . OBSTRUCTIVE SLEEP APNEA 04/07/2009    Qualifier: Diagnosis of  By: Gwenette Greet MD, Armando Reichert   . HYPERLIPIDEMIA-MIXED  05/31/2009    Qualifier: Diagnosis of  By: Aundra Dubin, MD, Dalton    . Gout 10/09/2012  . Acute respiratory failure with hypoxia 09/26/2012  . Chronic combined systolic and diastolic congestive heart failure 10/10/2012    2 D echo 09/2012:  Left ventricle: Extremely poor acoustic windows limit study. Overall LVEF appears to be severely depressed. Recomm ordering limited study with contrast to further evaluate LV systolic function. The cavity size was normal. Wall thickness was normal. Doppler parameters are consistent with abnormal left ventricular relaxation (grade 1 diastolic dysfunction).   2 D echo 03/2009 :  Left ventricle: The cavity size was normal. There was mild concentric hypertrophy. Systolic function was mildly reduced. The estimated ejection fraction was in the range of 45% to 50%. Diffuse hypokinesis. Doppler parameters are consistent with abnormal left ventricular relaxation (grade 1 diastolic dysfunction).     . Chronic kidney disease 10/10/2012    Stage 2 with GFR of 64   . Malignant hypertension 09/26/2012   Current Outpatient Prescriptions  Medication Sig Dispense Refill  . allopurinol (ZYLOPRIM) 300 MG tablet Take 1 tablet (300 mg total) by mouth daily.  30 tablet  5  . aspirin EC 81 MG EC tablet Take 1 tablet (81 mg total) by mouth daily.  30 tablet  2  . docusate sodium (COLACE) 100 MG capsule Take 1 capsule (100 mg total) by mouth daily as needed.  30 capsule  5  . ferrous sulfate 325 (65 FE) MG tablet Take 1 tablet (325 mg total) by mouth daily with breakfast.  90 tablet  3  . furosemide (LASIX) 80 MG tablet Take 1 tablet (80 mg total) by mouth 2 (two) times daily.  180 tablet  2  . HYDROcodone-acetaminophen (NORCO/VICODIN) 5-325 MG per tablet Take 1 tablet by mouth every 12 (twelve) hours as needed.  60 tablet  0  . lisinopril (PRINIVIL,ZESTRIL) 20 MG tablet Take 1 tablet (20 mg total) by mouth daily.  90 tablet  5  . metFORMIN (GLUCOPHAGE) 500 MG tablet Take 1 tablet (500 mg total) by  mouth 2 (two) times daily with a meal.  60 tablet  11  . pravastatin (PRAVACHOL) 40 MG tablet Take 2 tablets (80 mg total) by mouth every evening.  180 tablet  4  . spironolactone (ALDACTONE) 25 MG tablet Take 1 tablet (25 mg total) by mouth daily.  30 tablet  6  . zolpidem (AMBIEN) 10 MG tablet Take 0.5 tablets (5 mg total) by mouth at bedtime as needed for sleep.  30 tablet  5  . carvedilol (COREG) 25 MG tablet Take 1 tablet (25 mg total) by mouth 2 (two) times daily with a meal.  60 tablet  6  .  FLUoxetine (PROZAC) 40 MG capsule Take 20 mg by mouth daily.       No current facility-administered medications for this visit.   Family History  Problem Relation Age of Onset  . Asthma Mother   . Asthma Brother   . Asthma Brother   . Heart disease Mother   . Stroke Mother     clotting disorders   History   Social History  . Marital Status: Married    Spouse Name: N/A    Number of Children: 2  . Years of Education: N/A   Occupational History  . retired cna    Social History Main Topics  . Smoking status: Never Smoker   . Smokeless tobacco: None  . Alcohol Use: No  . Drug Use: No  . Sexual Activity: None   Other Topics Concern  . None   Social History Narrative  . None   Review of Systems:  Constitutional:  Fatigue. Denies fever, chills, diaphoresis, appetite change.    HEENT:  Denies congestion, sore throat  Respiratory:  DOE.  Denies SOB, cough, and wheezing.   Cardiovascular:  Denies chest pain, palpitations  Gastrointestinal:  Denies nausea, vomiting, abdominal pain  Genitourinary:  Denies dysuria, urgency, frequency, hematuria, flank pain and difficulty urinating.   Musculoskeletal:  Chronic knee pain  Skin:  Denies pallor, rash and wound.   Neurological:  Denies dizziness, seizures, syncope   Objective:  Physical Exam: Filed Vitals:   11/24/13 1524  BP: 112/77  Pulse: 59  Temp: 98.3 F (36.8 C)  TempSrc: Oral  Height: 5' 3" (1.6 m)  Weight: 340 lb 11.2 oz  (154.541 kg)  SpO2: 96%   Vitals reviewed.  General: sitting in chair, NAD  Abd: obese Ext: b/l lower extremity edema, moving all 4 extremities Neuro: alert and oriented X3  Assessment & Plan:  Discussed with Dr. Murlean Caller Likely need to change off prozac but need to have further risk vs benefit discussion of changing medication Refilled norco

## 2013-11-30 NOTE — Assessment & Plan Note (Signed)
Continues to lose weight and complaint with medications but non-compliant with cpap.  -follow up cards, appointment next week -continue current medications -stressed importance of cpap adherence

## 2013-11-30 NOTE — Assessment & Plan Note (Addendum)
In setting of depression that is improved with prozac.  Has been on wellbutrin in the past but was unable to afford it and transitioned to prozac. Very difficult situation given improvement in her depression and weight loss, but sexual dysfunction is evident and troubling to patient.  She is hesitant to change from prozac given the other benefits.   -discussed with pharmacy about other options but choices very limited especially due to cost. wellbutrin is now generic and possibly ~$25 a month but will need to discuss further with patient if she cannot afford it. -it may also be possible that her depression is not controlled entirely and could be contributing to the sexual dysfunction and thus need for titrating up the dose of prozac -finally, this could be psychosocial in origin given the trouble's between husband and wife.  Marriage counseling would be very beneficial! However, patient claims it would not work given her husband's attitude and cooperation in the past -organic component physically could be possible but doubtful with other etiologies as explained above more likely. She does need to have a papsmear and is willing to go to GYN for evaluation of both routine exam and possible any physical cause that could be contributing.  -she plans to finish current pills of prozac and will call me closer to the medication finishing to further discuss her options and if she is willing to change from prozac at that time.

## 2013-11-30 NOTE — Assessment & Plan Note (Signed)
NON-ADHERENT to CPAP despite counseling by multiple providers  -counseled again, hopefully this will improve

## 2013-11-30 NOTE — Assessment & Plan Note (Signed)
BP Readings from Last 3 Encounters:  11/26/13 126/66  11/24/13 112/77  08/25/13 124/80   Lab Results  Component Value Date   NA 146 11/26/2013   K 3.5* 11/26/2013   CREATININE 1.00 11/26/2013   Assessment: Blood pressure control: controlled Progress toward BP goal:  at goal  Plan: Medications:  continue current medications coreg bid, lasix, lisinopril, aldactone--doses will likely be adjusted by cards on follow up appointment

## 2013-11-30 NOTE — Assessment & Plan Note (Signed)
Weighing risks vs benefits of continuing prozac and cost affordable options. Please see discussion under anorgasmia

## 2013-11-30 NOTE — Assessment & Plan Note (Signed)
Improved with vicodin but asking for increased dose or switch to oxycodone (has taken several years ago and says gave her more energy). Hesitant to change dose at this time given improvement in pain and also complaints of sexual dysfunction.   -refilled vicodin for now for another month -continue weight loss

## 2013-11-30 NOTE — Assessment & Plan Note (Addendum)
Complaint with medications -refill this visit -recheck next visit

## 2013-12-01 NOTE — Progress Notes (Signed)
Case discussed with Dr. Eula Fried at time of visit.  We reviewed the resident's history and exam and pertinent patient test results.  I agree with the assessment, diagnosis, and plan of care documented in the resident's note.

## 2013-12-11 ENCOUNTER — Other Ambulatory Visit: Payer: Self-pay | Admitting: Internal Medicine

## 2013-12-11 NOTE — Telephone Encounter (Signed)
Please deny the 66m and approve the 40, pt will be out sunday

## 2013-12-12 MED ORDER — FLUOXETINE HCL 40 MG PO CAPS: 40.0000 mg | ORAL_CAPSULE | Freq: Every day | ORAL | Status: DC

## 2013-12-15 ENCOUNTER — Other Ambulatory Visit: Payer: Self-pay | Admitting: *Deleted

## 2013-12-15 MED ORDER — FLUOXETINE HCL 40 MG PO CAPS: 40.0000 mg | ORAL_CAPSULE | Freq: Every day | ORAL | Status: DC

## 2013-12-15 NOTE — Telephone Encounter (Signed)
I tried calling patient but number on chart says disconnected.

## 2013-12-15 NOTE — Telephone Encounter (Signed)
Pt called stating she thought she should be taking Prozac 80 mg daily.   Please call her if this is incorrect.

## 2013-12-27 ENCOUNTER — Other Ambulatory Visit: Payer: Self-pay | Admitting: Internal Medicine

## 2013-12-27 ENCOUNTER — Encounter: Payer: Self-pay | Admitting: Internal Medicine

## 2013-12-28 ENCOUNTER — Other Ambulatory Visit: Payer: Self-pay | Admitting: *Deleted

## 2013-12-28 DIAGNOSIS — M1710 Unilateral primary osteoarthritis, unspecified knee: Secondary | ICD-10-CM

## 2013-12-28 DIAGNOSIS — M179 Osteoarthritis of knee, unspecified: Secondary | ICD-10-CM

## 2013-12-28 MED ORDER — HYDROCODONE-ACETAMINOPHEN 5-325 MG PO TABS: 1.0000 | ORAL_TABLET | Freq: Two times a day (BID) | ORAL | Status: DC | PRN

## 2013-12-28 NOTE — Telephone Encounter (Signed)
Last refill for vicodin was 4/7 Also pt request a change from pravastatin as it cost $87 a month.  She can't afford.  I called pharmacy and Lovastatin 10 and 20 mg are on the $4 list, so depends of dose you want.

## 2013-12-28 NOTE — Telephone Encounter (Signed)
How much is lovastatin 40?

## 2013-12-28 NOTE — Telephone Encounter (Signed)
Rx ready, tried to call pt and phone is not working.

## 2013-12-28 NOTE — Telephone Encounter (Signed)
Lovastatin is fine, ideally the conversion would be to 56m, however, if that is not available on $4 list, can we do lovastatin 427m(2048mablets x2 qd)?   Does she need norco today? Otherwise, [facility] Also, can you find out from her pharmacy how much wellbutrin would be? We may need to change her SSRI.

## 2013-12-29 NOTE — Telephone Encounter (Signed)
Lovastatin 40 mg cost $25 a month but Lovastatin 20 mg taking 2 a day cost $8

## 2013-12-31 ENCOUNTER — Other Ambulatory Visit: Payer: Self-pay | Admitting: Internal Medicine

## 2013-12-31 MED ORDER — LOVASTATIN 20 MG PO TABS: 40.0000 mg | ORAL_TABLET | Freq: Every day | ORAL | Status: DC

## 2014-01-18 ENCOUNTER — Encounter: Payer: Self-pay | Admitting: Internal Medicine

## 2014-01-18 ENCOUNTER — Other Ambulatory Visit: Payer: Self-pay | Admitting: Internal Medicine

## 2014-01-19 ENCOUNTER — Encounter: Payer: Self-pay | Admitting: *Deleted

## 2014-01-19 ENCOUNTER — Other Ambulatory Visit: Payer: Self-pay | Admitting: *Deleted

## 2014-01-19 DIAGNOSIS — M179 Osteoarthritis of knee, unspecified: Secondary | ICD-10-CM

## 2014-01-19 DIAGNOSIS — M1710 Unilateral primary osteoarthritis, unspecified knee: Secondary | ICD-10-CM

## 2014-01-19 MED ORDER — HYDROCODONE-ACETAMINOPHEN 5-325 MG PO TABS: 1.0000 | ORAL_TABLET | Freq: Two times a day (BID) | ORAL | Status: DC | PRN

## 2014-01-26 ENCOUNTER — Other Ambulatory Visit: Payer: Self-pay | Admitting: *Deleted

## 2014-01-26 DIAGNOSIS — M179 Osteoarthritis of knee, unspecified: Secondary | ICD-10-CM

## 2014-01-26 DIAGNOSIS — M1710 Unilateral primary osteoarthritis, unspecified knee: Secondary | ICD-10-CM

## 2014-01-26 MED ORDER — FLUOXETINE HCL 40 MG PO CAPS: 80.0000 mg | ORAL_CAPSULE | Freq: Every day | ORAL | Status: DC

## 2014-01-26 NOTE — Telephone Encounter (Signed)
Hydrocodone is ready - pt called/informed.

## 2014-01-26 NOTE — Telephone Encounter (Signed)
She needs to come in to opc today, i have sent several messages to the front to have her schedule with me this month for depression visit for at least 1 hour.

## 2014-01-26 NOTE — Telephone Encounter (Signed)
Pt has been taking 80 mg - states this has been helping. Please change if appropriate. Thanks

## 2014-01-26 NOTE — Telephone Encounter (Signed)
You should already have the prescription from the norco. i filled it last week and gave to triage.

## 2014-02-09 ENCOUNTER — Encounter: Payer: Self-pay | Admitting: Internal Medicine

## 2014-02-23 ENCOUNTER — Encounter: Payer: Self-pay | Admitting: Internal Medicine

## 2014-02-23 ENCOUNTER — Ambulatory Visit (INDEPENDENT_AMBULATORY_CARE_PROVIDER_SITE_OTHER): Payer: Self-pay | Admitting: Internal Medicine

## 2014-02-23 VITALS — BP 127/82 | HR 73 | Temp 99.2°F | Wt 341.8 lb

## 2014-02-23 DIAGNOSIS — F3289 Other specified depressive episodes: Secondary | ICD-10-CM

## 2014-02-23 DIAGNOSIS — R7989 Other specified abnormal findings of blood chemistry: Secondary | ICD-10-CM

## 2014-02-23 DIAGNOSIS — IMO0002 Reserved for concepts with insufficient information to code with codable children: Secondary | ICD-10-CM

## 2014-02-23 DIAGNOSIS — I10 Essential (primary) hypertension: Secondary | ICD-10-CM

## 2014-02-23 DIAGNOSIS — F329 Major depressive disorder, single episode, unspecified: Secondary | ICD-10-CM

## 2014-02-23 DIAGNOSIS — M170 Bilateral primary osteoarthritis of knee: Secondary | ICD-10-CM

## 2014-02-23 DIAGNOSIS — Z6841 Body Mass Index (BMI) 40.0 and over, adult: Secondary | ICD-10-CM

## 2014-02-23 DIAGNOSIS — M1710 Unilateral primary osteoarthritis, unspecified knee: Secondary | ICD-10-CM

## 2014-02-23 DIAGNOSIS — I5042 Chronic combined systolic (congestive) and diastolic (congestive) heart failure: Secondary | ICD-10-CM

## 2014-02-23 DIAGNOSIS — I509 Heart failure, unspecified: Secondary | ICD-10-CM

## 2014-02-23 DIAGNOSIS — E790 Hyperuricemia without signs of inflammatory arthritis and tophaceous disease: Secondary | ICD-10-CM

## 2014-02-23 DIAGNOSIS — F32A Depression, unspecified: Secondary | ICD-10-CM

## 2014-02-23 MED ORDER — HYDROCODONE-ACETAMINOPHEN 5-325 MG PO TABS: 1.0000 | ORAL_TABLET | Freq: Four times a day (QID) | ORAL | Status: DC | PRN

## 2014-02-23 NOTE — Assessment & Plan Note (Signed)
Needs to follow up with Dr. Aundra Dubin this month.  Has been taking spironolactone 50m daily, although prescription says take 1/2 tablet. Will ask RN from Dr. MClaris Gladdenoffice to clarify.  Also has not been contacted about cpap mask from [facility] per cardiology note on last visit, will ask their office to follow up if possible.

## 2014-02-23 NOTE — Assessment & Plan Note (Signed)
BP Readings from Last 3 Encounters:  02/23/14 127/82  11/26/13 126/66  11/24/13 112/77   Lab Results  Component Value Date   NA 146 11/26/2013   K 3.5* 11/26/2013   CREATININE 1.00 11/26/2013    Assessment: Blood pressure control: controlled Progress toward BP goal:  at goal Comments:   Plan: Medications:  continue current medications lisinopril 20, lasix 80 bid, spironolactone 40m, and coreg 25 bid

## 2014-02-23 NOTE — Assessment & Plan Note (Signed)
Refused uric acid check today

## 2014-02-23 NOTE — Assessment & Plan Note (Addendum)
Difficult situation. Does not wish to try any medications that may have an effect on her heart, thus TCA out (although she says Pamelor has helped in the past). 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 She is happy with prozac in regards to no weight gain and her depression is stable according to her, not worse, but not much better either. Limited also due to cost as she has no insurance and no orange card. Thus while wellbutrin would be ideal given less sexual side-effects, she cannot afford it at this time.   -continue prozac 36m daily for now, max dose -follow up [facility] is encouraged today as perhaps they can weigh in on medication adjustment or dual therapy. Currently my choices are limited. Of now, celexa she did not respond well too.  -i will increase norco to TID given increase in pain in her knees and this may be contributing to depression as well -encouraged resuming daily activities and walking

## 2014-02-23 NOTE — Assessment & Plan Note (Signed)
Refilled norco, increased to q6h 1 tablet prn

## 2014-02-23 NOTE — Patient Instructions (Signed)
General Instructions:  Please follow up with [facility] about your depression.   We have increased your pain medication to 1 tablet every 6 hours as needed  Please follow up with debra hill about orange card  Please wear cpap every night! And follow up with heart failure team this month  Thank you for bringing your medicines today. This helps [place] keep you safe from mistakes.   Progress Toward Treatment Goals:  Treatment Goal 02/23/2014  Blood pressure at goal  Prevent falls -    Self Care Goals & Plans:  Self Care Goal 11/24/2013  Manage my medications take my medicines as prescribed; bring my medications to every visit; refill my medications on time  Monitor my health -  Eat healthy foods drink diet soda or water instead of juice or soda; eat foods that are low in salt; eat baked foods instead of fried foods  Be physically active find an activity I enjoy    No flowsheet data found.   Care Management & Community Referrals:  Referral 06/02/2013  Referrals made to community resources weight management; exercise/physical therapy; nutrition

## 2014-02-23 NOTE — Progress Notes (Signed)
INTERNAL MEDICINE TEACHING ATTENDING ADDENDUM - Drucilla Cumber, MD: I reviewed and discussed at the time of visit with the resident Dr. Qureshi, the patient's medical history, physical examination, diagnosis and results of pertinent tests and treatment and I agree with the patient's care as documented.  

## 2014-02-23 NOTE — Assessment & Plan Note (Signed)
Slight increase in weight. We discussed weight loss, diet modifications, and increase in exercise.

## 2014-02-23 NOTE — Progress Notes (Signed)
Subjective:   [name] female   DOB: [birth year] 63 y.o.   MRN: [id number]  HPI: [name] is a 63 y.o. obese female with HTN, combined CHF, DM2, and depression presenting to [facility] today for routine follow up visit and discussing her depression.   DM2: well controlled. Compliant with metformin 590m bid. Wishes to continue metformin. Weight slightly up today from last visit.   HTN: well controlled. Compliant with coreg, lasix, spironolactone, and lisinopril.   Depression: on prozac 825mdaily (max dose). No insurance, no longer qualifies for orange card due to home income. Reports still depressed but happy with lack of weight gain from her other medications in the past. She cannot afford wellbutrin and says her depression was uncontrolled with that in the past as well. Denies suicidal or homicidal ideation, but mainly has lack of interest in activity. She has started cooking again but around the months of her children's death, she gets down again. She also continues to struggle in her relationship with her husband and lack of intimacy but says she has excepted that. We discussed seeing psychiatry and given her orange card, she is in agreement to see [facility]. In the meantime, she does not wish to try any other medications that may affect her heart, so we will continue current prozac.   Past Medical History  Diagnosis Date  . Hypertension   . Diabetes mellitus   . Depression   . Nonischemic cardiomyopathy   . Morbid obesity   . Gout   . Bilateral knee pain   . Cellulitis of lower leg     left  . H/O: hysterectomy   . Unspecified essential hypertension 03/22/2009    Qualifier: Diagnosis of  By: LaKarrie MeresN, BSN, [name]    . Acute respiratory failure with hypoxia 09/26/2012  . Chronic kidney disease 10/10/2012    Stage 2 with GFR of 64   . Malignant hypertension 09/26/2012  . Chronic combined systolic and diastolic congestive heart failure 10/10/2012    2 D echo  09/2012:  Left ventricle: Extremely poor acoustic windows limit study. Overall LVEF appears to be severely depressed. Recomm ordering limited study with contrast to further evaluate LV systolic function. The cavity size was normal. Wall thickness was normal. Doppler parameters are consistent with abnormal left ventricular relaxation (grade 1 diastolic dysfunction).   2 D echo 03/2009 :  Left ventricle: The cavity size was normal. There was mild concentric hypertrophy. Systolic function was mildly reduced. The estimated ejection fraction was in the range of 45% to 50%. Diffuse hypokinesis. Doppler parameters are consistent with abnormal left ventricular relaxation (grade 1 diastolic dysfunction).     . Hyperlipidemia   . Sleep apnea     on cpap   Current Outpatient Prescriptions  Medication Sig Dispense Refill  . allopurinol (ZYLOPRIM) 300 MG tablet Take 1 tablet (300 mg total) by mouth daily.  30 tablet  5  . aspirin EC 81 MG EC tablet Take 1 tablet (81 mg total) by mouth daily.  30 tablet  2  . carvedilol (COREG) 25 MG tablet Take 1 tablet (25 mg total) by mouth 2 (two) times daily with a meal.  60 tablet  6  . docusate sodium (COLACE) 100 MG capsule Take 1 capsule (100 mg total) by mouth daily as needed.  30 capsule  5  . ferrous sulfate 325 (65 FE) MG tablet Take 1 tablet (325 mg total) by mouth daily with breakfast.  90 tablet  3  .  FLUoxetine (PROZAC) 40 MG capsule Take 2 capsules (80 mg total) by mouth daily.  60 capsule  1  . furosemide (LASIX) 80 MG tablet Take 1 tablet (80 mg total) by mouth 2 (two) times daily.  180 tablet  2  . HYDROcodone-acetaminophen (NORCO/VICODIN) 5-325 MG per tablet Take 1 tablet by mouth every 12 (twelve) hours as needed for severe pain.  60 tablet  0  . lisinopril (PRINIVIL,ZESTRIL) 20 MG tablet Take 1 tablet (20 mg total) by mouth daily.  90 tablet  5  . lovastatin (MEVACOR) 20 MG tablet Take 2 tablets (40 mg total) by mouth at bedtime.  60 tablet  3  . metFORMIN  (GLUCOPHAGE) 500 MG tablet Take 1 tablet (500 mg total) by mouth 2 (two) times daily with a meal.  60 tablet  11  . spironolactone (ALDACTONE) 25 MG tablet Take 1 tablet (25 mg total) by mouth daily.  30 tablet  6  . zolpidem (AMBIEN) 10 MG tablet Take 0.5 tablets (5 mg total) by mouth at bedtime as needed for sleep.  30 tablet  5   No current facility-administered medications for this visit.   Family History  Problem Relation Age of Onset  . Asthma Mother   . Asthma Brother   . Asthma Brother   . Heart disease Mother   . Stroke Mother     clotting disorders   History   Social History  . Marital Status: Married    Spouse Name: N/A    Number of Children: 2  . Years of Education: N/A   Occupational History  . retired cna    Social History Main Topics  . Smoking status: Never Smoker   . Smokeless tobacco: Not on file  . Alcohol Use: No  . Drug Use: No  . Sexual Activity: Not on file   Other Topics Concern  . Not on file   Social History Narrative  . No narrative on file    Review of Systems:  Constitutional:  Denies fever, chills. Decrease interest  HEENT:  Denies congestion, sore throat, rhinorrhea, sneezing, mouth sores, trouble swallowing,   Respiratory:  DOE  Cardiovascular:  Denies chest pain  Gastrointestinal:  Denies nausea, vomiting, abdominal pain. Occasional constipation  Genitourinary:  Denies dysuria  Musculoskeletal:  B/l knee pain.   Skin:  Denies pallor, rash and wound.   Neurological/Psych:  Depression   Objective:  Physical Exam: Filed Vitals:   02/23/14 1405  BP: 127/82  Pulse: 73  Temp: 99.2 F (37.3 C)  TempSrc: Oral  Weight: 341 lb 12.8 oz (155.039 kg)  SpO2: 96%   Vitals reviewed. General: sitting in wheel chair, NAD HEENT: EOMI Cardiac: RRR, +murmur, loudest upper sternal borders Pulm: clear to auscultation bilaterally, no wheezes, rales, or rhonchi Abd: soft, obese, nontender, BS present Ext: warm and well perfused, mild lower  extremity edema, +2DP B/L Neuro: alert and oriented X3  Assessment & Plan:  Discussed with Dr. Dareen Piano Needs to see [facility] Continue prozac Increased norco to q6h

## 2014-03-19 ENCOUNTER — Encounter (HOSPITAL_COMMUNITY): Payer: Self-pay | Admitting: Vascular Surgery

## 2014-03-25 ENCOUNTER — Ambulatory Visit: Payer: Self-pay

## 2014-03-29 ENCOUNTER — Other Ambulatory Visit: Payer: Self-pay | Admitting: Internal Medicine

## 2014-03-29 ENCOUNTER — Other Ambulatory Visit: Payer: Self-pay | Admitting: *Deleted

## 2014-03-30 MED ORDER — HYDROCODONE-ACETAMINOPHEN 5-325 MG PO TABS: 1.0000 | ORAL_TABLET | Freq: Four times a day (QID) | ORAL | Status: DC | PRN

## 2014-03-30 MED ORDER — SPIRONOLACTONE 25 MG PO TABS: 25.0000 mg | ORAL_TABLET | Freq: Every day | ORAL | Status: DC

## 2014-03-30 MED ORDER — FLUOXETINE HCL 40 MG PO CAPS: 80.0000 mg | ORAL_CAPSULE | Freq: Every day | ORAL | Status: DC

## 2014-03-30 NOTE — Telephone Encounter (Signed)
Spironolactone and Prozac refilled at [facility] (however, wait to have correct dose of spironolactone verified by cardiology office). Norco prescription ready for pick up at [facility].   Please have labs checked with cardiology (preferred)--monitor K while on spironolactone and lisinopril. She is due for follow up with their office and needs to have her cpap mask and dose of spironolactone (was prescribed 25m daily last by NP but her pills in the office said take 1/2 tablet, however 237mlisted on epic) checked by Dr. McClaris Gladdenffice.

## 2014-03-30 NOTE — Telephone Encounter (Signed)
Phone has been disconnected

## 2014-03-31 ENCOUNTER — Telehealth (HOSPITAL_COMMUNITY): Payer: Self-pay | Admitting: Vascular Surgery

## 2014-03-31 NOTE — Telephone Encounter (Signed)
Triage nurse [facility] needs to clarify medication amounts .Marland Kitchen Please advise

## 2014-03-31 NOTE — Telephone Encounter (Signed)
i have called the heart failure clinic where she is seen and left a message for them to call me back, awaiting that call

## 2014-03-31 NOTE — Telephone Encounter (Signed)
i have made multiple calls to the heart failure clinic, i will continue to call

## 2014-03-31 NOTE — Telephone Encounter (Signed)
Attempted to call back no answer and VM set up

## 2014-04-18 ENCOUNTER — Encounter: Payer: Self-pay | Admitting: Internal Medicine

## 2014-04-19 ENCOUNTER — Other Ambulatory Visit: Payer: Self-pay | Admitting: *Deleted

## 2014-04-19 NOTE — Telephone Encounter (Signed)
Lisinopril has refills

## 2014-04-25 ENCOUNTER — Other Ambulatory Visit: Payer: Self-pay | Admitting: Internal Medicine

## 2014-05-04 ENCOUNTER — Other Ambulatory Visit: Payer: Self-pay | Admitting: *Deleted

## 2014-05-04 MED ORDER — HYDROCODONE-ACETAMINOPHEN 5-325 MG PO TABS: 1.0000 | ORAL_TABLET | Freq: Four times a day (QID) | ORAL | Status: DC | PRN

## 2014-05-04 NOTE — Telephone Encounter (Signed)
i have never gotten a call back from cardiac rehab, closing

## 2014-05-04 NOTE — Telephone Encounter (Signed)
3 is fine except I am sorry but i am at an away rotation and then vacation. Please have filled by [facility] attending if possible.   Thank you,  Dr q

## 2014-05-04 NOTE — Telephone Encounter (Signed)
Could we do 3?

## 2014-05-18 ENCOUNTER — Other Ambulatory Visit: Payer: Self-pay | Admitting: Internal Medicine

## 2014-05-18 ENCOUNTER — Encounter: Payer: Self-pay | Admitting: Internal Medicine

## 2014-05-24 ENCOUNTER — Encounter: Payer: Self-pay | Admitting: *Deleted

## 2014-06-17 ENCOUNTER — Other Ambulatory Visit: Payer: Self-pay | Admitting: *Deleted

## 2014-06-17 ENCOUNTER — Encounter: Payer: Self-pay | Admitting: Internal Medicine

## 2014-06-17 NOTE — Telephone Encounter (Signed)
I am away until Monday can this be filled then? If not, I am okay if filled by attending until she can be seen by me for next visit.   Thanks,  Dr q

## 2014-06-22 MED ORDER — HYDROCODONE-ACETAMINOPHEN 5-325 MG PO TABS: 1.0000 | ORAL_TABLET | Freq: Four times a day (QID) | ORAL | Status: DC | PRN

## 2014-07-27 ENCOUNTER — Other Ambulatory Visit: Payer: Self-pay

## 2014-07-27 ENCOUNTER — Ambulatory Visit (INDEPENDENT_AMBULATORY_CARE_PROVIDER_SITE_OTHER): Payer: Self-pay | Admitting: *Deleted

## 2014-07-27 ENCOUNTER — Encounter: Payer: Self-pay | Admitting: Internal Medicine

## 2014-07-27 ENCOUNTER — Ambulatory Visit (INDEPENDENT_AMBULATORY_CARE_PROVIDER_SITE_OTHER): Payer: Self-pay | Admitting: Internal Medicine

## 2014-07-27 ENCOUNTER — Ambulatory Visit (HOSPITAL_COMMUNITY)
Admission: RE | Admit: 2014-07-27 | Discharge: 2014-07-27 | Disposition: A | Discharge status: Home / Self Care | Payer: Self-pay | Source: Ambulatory Visit | Attending: Internal Medicine | Admitting: Internal Medicine

## 2014-07-27 VITALS — BP 121/72 | HR 58 | Temp 98.1°F | Wt 336.5 lb

## 2014-07-27 DIAGNOSIS — Z6841 Body Mass Index (BMI) 40.0 and over, adult: Secondary | ICD-10-CM

## 2014-07-27 DIAGNOSIS — I10 Essential (primary) hypertension: Secondary | ICD-10-CM

## 2014-07-27 DIAGNOSIS — I5042 Chronic combined systolic (congestive) and diastolic (congestive) heart failure: Secondary | ICD-10-CM

## 2014-07-27 DIAGNOSIS — E119 Type 2 diabetes mellitus without complications: Secondary | ICD-10-CM

## 2014-07-27 DIAGNOSIS — Z8639 Personal history of other endocrine, nutritional and metabolic disease: Secondary | ICD-10-CM | POA: Insufficient documentation

## 2014-07-27 DIAGNOSIS — F329 Major depressive disorder, single episode, unspecified: Secondary | ICD-10-CM

## 2014-07-27 DIAGNOSIS — F32A Depression, unspecified: Secondary | ICD-10-CM

## 2014-07-27 DIAGNOSIS — Z0000 Encounter for general adult medical examination without abnormal findings: Secondary | ICD-10-CM

## 2014-07-27 DIAGNOSIS — R079 Chest pain, unspecified: Secondary | ICD-10-CM | POA: Insufficient documentation

## 2014-07-27 DIAGNOSIS — E790 Hyperuricemia without signs of inflammatory arthritis and tophaceous disease: Secondary | ICD-10-CM

## 2014-07-27 DIAGNOSIS — G4733 Obstructive sleep apnea (adult) (pediatric): Secondary | ICD-10-CM

## 2014-07-27 DIAGNOSIS — Z23 Encounter for immunization: Secondary | ICD-10-CM

## 2014-07-27 LAB — CBC WITH DIFFERENTIAL/PLATELET
Basophils Absolute: 0 10*3/uL (ref 0.0–0.1)
Basophils Relative: 0 % (ref 0–1)
Eosinophils Absolute: 0.2 10*3/uL (ref 0.0–0.7)
Eosinophils Relative: 4 % (ref 0–5)
HCT: 35.8 % — ABNORMAL LOW (ref 36.0–46.0)
HEMOGLOBIN: 11.8 g/dL — AB (ref 12.0–15.0)
LYMPHS ABS: 1.6 10*3/uL (ref 0.7–4.0)
LYMPHS PCT: 35 % (ref 12–46)
MCH: 30.3 pg (ref 26.0–34.0)
MCHC: 33 g/dL (ref 30.0–36.0)
MCV: 91.8 fL (ref 78.0–100.0)
MONO ABS: 0.4 10*3/uL (ref 0.1–1.0)
MPV: 10.2 fL (ref 9.4–12.4)
Monocytes Relative: 8 % (ref 3–12)
Neutro Abs: 2.5 10*3/uL (ref 1.7–7.7)
Neutrophils Relative %: 53 % (ref 43–77)
Platelets: 232 10*3/uL (ref 150–400)
RBC: 3.9 MIL/uL (ref 3.87–5.11)
RDW: 13.5 % (ref 11.5–15.5)
WBC: 4.7 10*3/uL (ref 4.0–10.5)

## 2014-07-27 LAB — GLUCOSE, CAPILLARY: Glucose-Capillary: 92 mg/dL (ref 70–99)

## 2014-07-27 LAB — BASIC METABOLIC PANEL WITH GFR
BUN: 20 mg/dL (ref 6–23)
CALCIUM: 9 mg/dL (ref 8.4–10.5)
CHLORIDE: 101 meq/L (ref 96–112)
CO2: 28 meq/L (ref 19–32)
Creat: 0.92 mg/dL (ref 0.50–1.10)
GFR, Est African American: 77 mL/min
GFR, Est Non African American: 66 mL/min
GLUCOSE: 90 mg/dL (ref 70–99)
POTASSIUM: 4.2 meq/L (ref 3.5–5.3)
SODIUM: 141 meq/L (ref 135–145)

## 2014-07-27 LAB — POCT GLYCOSYLATED HEMOGLOBIN (HGB A1C): Hemoglobin A1C: 5.4

## 2014-07-27 MED ORDER — ZOLPIDEM TARTRATE 10 MG PO TABS: 5.0000 mg | ORAL_TABLET | Freq: Every evening | ORAL | Status: DC | PRN

## 2014-07-27 NOTE — Progress Notes (Signed)
Subjective:   [name] female   DOB: 10-31-1950 63 y.o.   MRN: [id number]  HPI: [name] is a 63 y.o. female with PMH as listed below presenting to opc today for routine visit.   Depression--doing better on prozac but lately has had decrease interest again with decreased motivation to get up and do anything. Denies suicidal or homicidal ideation. Still having issues with her husband who is getting sicker and they tend to argue at times. She does not wish to change off the prozac since it is working and also helping with her weight loss, but is limited in her finances for other medications. She has not filled the ambien for some time with insomnia but wishes to try it again.   CHF--has not seen Dr. Aundra Dubin since April. I have recommended that she needs to follow up with him again and may need stress test given that she reports having occasional left sided chest pain especially when stressed. The chest pain she says starts on top of her left shoulder radiates to upper chest but tender to palpation and spontaneously resolves. Currently no chest pain, sob, nausea, or vomiting.   OSA--still not using CPAP. I have again encouraged her to touch base with Dr. Janifer Adie office to see if she can get fitted for her mask which she says has been the limiting factor.   Health maintenance--flu vaccine, a1c, and foot exam done today Past Medical History  Diagnosis Date  . Hypertension   . Diabetes mellitus   . Depression   . Nonischemic cardiomyopathy   . Morbid obesity   . Gout   . Bilateral knee pain   . Cellulitis of lower leg     left  . H/O: hysterectomy   . Unspecified essential hypertension 03/22/2009    Qualifier: Diagnosis of  By: Karrie Meres RN, BSN, [name]    . Acute respiratory failure with hypoxia 09/26/2012  . Chronic kidney disease 10/10/2012    Stage 2 with GFR of 64   . Malignant hypertension 09/26/2012  . Chronic combined systolic and diastolic congestive heart  failure 10/10/2012    2 D echo 09/2012:  Left ventricle: Extremely poor acoustic windows limit study. Overall LVEF appears to be severely depressed. Recomm ordering limited study with contrast to further evaluate LV systolic function. The cavity size was normal. Wall thickness was normal. Doppler parameters are consistent with abnormal left ventricular relaxation (grade 1 diastolic dysfunction).   2 D echo 03/2009 :  Left ventricle: The cavity size was normal. There was mild concentric hypertrophy. Systolic function was mildly reduced. The estimated ejection fraction was in the range of 45% to 50%. Diffuse hypokinesis. Doppler parameters are consistent with abnormal left ventricular relaxation (grade 1 diastolic dysfunction).     . Hyperlipidemia   . Sleep apnea     on cpap   Current Outpatient Prescriptions  Medication Sig Dispense Refill  . allopurinol (ZYLOPRIM) 300 MG tablet Take 1 tablet (300 mg total) by mouth daily. 90 tablet 5  . aspirin EC 81 MG EC tablet Take 1 tablet (81 mg total) by mouth daily. 30 tablet 2  . carvedilol (COREG) 25 MG tablet Take 1 tablet (25 mg total) by mouth 2 (two) times daily with a meal. 60 tablet 6  . FLUoxetine (PROZAC) 40 MG capsule Take 2 capsules (80 mg total) by mouth daily. 60 capsule 5  . furosemide (LASIX) 80 MG tablet TAKE ONE TABLET BY MOUTH TWICE DAILY 180 tablet 0  .  HYDROcodone-acetaminophen (NORCO/VICODIN) 5-325 MG per tablet Take 1 tablet by mouth every 6 (six) hours as needed for severe pain. 120 tablet 0  . lisinopril (PRINIVIL,ZESTRIL) 20 MG tablet Take 1 tablet (20 mg total) by mouth daily. 90 tablet 5  . lovastatin (MEVACOR) 20 MG tablet TAKE TWO TABLETS BY MOUTH ONCE DAILY AT BEDTIME 60 tablet 3  . metFORMIN (GLUCOPHAGE) 500 MG tablet Take 1 tablet (500 mg total) by mouth 2 (two) times daily with a meal. 60 tablet 11  . spironolactone (ALDACTONE) 25 MG tablet Take 1 tablet (25 mg total) by mouth daily. 30 tablet 6  . zolpidem (AMBIEN) 10 MG  tablet Take 0.5 tablets (5 mg total) by mouth at bedtime as needed for sleep. 30 tablet 0   No current facility-administered medications for this visit.   Family History  Problem Relation Age of Onset  . Asthma Mother   . Asthma Brother   . Asthma Brother   . Heart disease Mother   . Stroke Mother     clotting disorders   History   Social History  . Marital Status: Married    Spouse Name: N/A    Number of Children: 2  . Years of Education: N/A   Occupational History  . retired cna    Social History Main Topics  . Smoking status: Never Smoker   . Smokeless tobacco: None  . Alcohol Use: No  . Drug Use: No  . Sexual Activity: None   Other Topics Concern  . None   Social History Narrative   Review of Systems:  Constitutional:  Denies fever, chills  HEENT:  Denies congestion   Respiratory:  Denies SOB  Cardiovascular:  Denies chest pain  Gastrointestinal:  Denies nausea, vomiting, abdominal pain. Obesity.   Genitourinary:  Denies dysuria.   Musculoskeletal:  Chronic lower extremity pain  Neurological:  Denies headaches.    Objective:  Physical Exam: Filed Vitals:   07/27/14 1442  BP: 121/72  Pulse: 58  Temp: 98.1 F (36.7 C)  TempSrc: Oral  Weight: 336 lb 8 oz (152.635 kg)  SpO2: 97%   EKG: 53bpm, sinus bradycardia, non-specific st and t wave changes, flattening of t waves in inferior lateral leads and aVR, ?q in lead III Vitals reviewed. General: sitting in wheelchair, NAD HEENT: EOMI Cardiac: Bradycardia Chest: Mild TTP left upper shoulder and upper chest Pulm: clear to auscultation bilaterally, no wheezes, rales, or rhonchi Abd: soft, obese nontender, nondistended, BS present Ext: warm and well perfused, no pedal edema, +2DP B/L, moving all extremities Neuro: alert and oriented X3, strength and sensation to light touch equal in bilateral upper and lower extremities  Assessment & Plan:  Discussed with Dr. Dareen Piano

## 2014-07-27 NOTE — Assessment & Plan Note (Signed)
Lab Results  Component Value Date   HGBA1C 5.4 07/27/2014   HGBA1C 5.4 08/25/2013   HGBA1C 5.7 06/02/2013    Assessment: Diabetes control:   well controlled Progress toward A1C goal:    at goal Comments: continues to lose weight, compliant with metformin  Plan: Medications:  continue current medications metformin 546m bid Home glucose monitoring: Frequency:   Timing:   Instruction/counseling given: reminded to bring medications to each visit, discussed foot care and discussed the need for weight loss

## 2014-07-27 NOTE — Assessment & Plan Note (Signed)
Still has not gotten cpap. She needs to follow up with Dr. Janifer Adie office. Will try to help set this up with front desk staff as well.   -continues to stress the importance of cpap compliance

## 2014-07-27 NOTE — Assessment & Plan Note (Signed)
Overdue for follow up with Dr. Aundra Dubin.  May consider stress test as well given occasional left sided chest pain EKG done in opc today somewhat similar to prior's but sinus bradycardia, t wave flattening in multiple leads--inferior lateral and avR, ? q wave  No chest pain at this time, tender to palpation left shoulder and upper chest makes possible MSK etiology more likely. Additionally, she endorses the pain to come on when feeling stressed which could be psychological contributor to pain.   -follow up with Dr. Aundra Dubin -consider stress test -continue BB, lasix, ACEi, and ARB -check bmet today

## 2014-07-27 NOTE — Assessment & Plan Note (Signed)
Repeat uric acid level today  Currently on allopurinol 371m

## 2014-07-27 NOTE — Assessment & Plan Note (Signed)
Somewhat improved on prozac 19m daily, however, recently endorses decreased interest and motivation again. Denies suicidal or homicidal ideation. Still having troubled relationship with husband and since he is sick lots of stress.   She is limited based on finances that also limits our options. She wishes to not change off prozac as it helps with depression and weight.   -will continue prozac for now and continue to monitor. We discussed trying to move around more even if she doesn't feel like it and with continued weight loss she may continue to feel better. She may benefit from discussing with [facility] and we can try to approach this again on next visit.

## 2014-07-27 NOTE — Assessment & Plan Note (Signed)
Continues to lose weight, down to 336.5lb's! Still has long way to go but encouraged continuation of efforts. Hopefully if she can find motivation to move around more she will notice further improvement.   Filed Weights   07/27/14 1442  Weight: 336 lb 8 oz (152.635 kg)

## 2014-07-27 NOTE — Patient Instructions (Addendum)
General Instructions:  Please bring your medicines with you each time you come to clinic.  Medicines may include prescription medications, over-the-counter medications, herbal remedies, eye drops, vitamins, or other pills.  Great job on your weight loss, lets keep it up!   Please call Dr. Claris Gladden office and make an appointment to see him and follow up with Dr. Gwenette Greet as well for your CPAP  Please get your orange card  Follow up with me 3-6 months  If you have any more chest pain, nausea, vomiting, sob go directly to the emergency room.   Progress Toward Treatment Goals:  Treatment Goal 07/27/2014  Blood pressure at goal  Prevent falls -    Self Care Goals & Plans:  Self Care Goal 07/27/2014  Manage my medications take my medicines as prescribed; bring my medications to every visit; refill my medications on time  Monitor my health -  Eat healthy foods eat more vegetables; eat foods that are low in salt; eat baked foods instead of fried foods  Be physically active find an activity I enjoy    No flowsheet data found.   Care Management & Community Referrals:  Referral 06/02/2013  Referrals made to community resources weight management; exercise/physical therapy; nutrition

## 2014-07-27 NOTE — Assessment & Plan Note (Signed)
Flu vaccine today along with a1c check and foot exam

## 2014-07-27 NOTE — Assessment & Plan Note (Signed)
BP Readings from Last 3 Encounters:  07/27/14 121/72  02/23/14 127/82  11/26/13 126/66   Lab Results  Component Value Date   NA 146 11/26/2013   K 3.5* 11/26/2013   CREATININE 1.00 11/26/2013   Assessment: Blood pressure control: controlled Progress toward BP goal:  at goal  Plan: Medications:  continue current medications aldactone 26m daily, lisinopril 260mdaily, lasix 8092mid, coreg 66m66md (bradycardia today 50s but appears asymptomatic so not changed) Educational resources provided:   Self management tools provided:   Other plans: bmet today, follow up with cards may change lasix and/or BB dose at that time

## 2014-07-28 NOTE — Progress Notes (Signed)
INTERNAL MEDICINE TEACHING ATTENDING ADDENDUM - Aldine Contes, MD: I reviewed and discussed at the time of visit with the resident Dr. Eula Fried, the patient's medical history, physical examination, diagnosis and results of pertinent tests and treatment and I agree with the patient's care as documented.

## 2014-07-29 ENCOUNTER — Other Ambulatory Visit (HOSPITAL_COMMUNITY): Payer: Self-pay | Admitting: Cardiology

## 2014-07-29 LAB — URIC ACID: URIC ACID, SERUM: 4.6 mg/dL (ref 2.4–7.0)

## 2014-07-29 LAB — HIV ANTIBODY (ROUTINE TESTING W REFLEX): HIV: NONREACTIVE

## 2014-07-30 ENCOUNTER — Telehealth: Payer: Self-pay | Admitting: Internal Medicine

## 2014-07-30 MED ORDER — FERROUS SULFATE 325 (65 FE) MG PO TABS: 325.0000 mg | ORAL_TABLET | Freq: Three times a day (TID) | ORAL | Status: DC

## 2014-07-30 NOTE — Telephone Encounter (Signed)
I called Robin Hardin in regards to her recent lab work. Uric acid level improved, will not make changes to allopurinol dose for now. Her Hb has decreased slightly since she stopped her iron supplements. She wishes to restart at this time with TID dosing instead of waiting until recheck of next visit because she thinks it used to help with her energy levels.   I have reordered the iron tablets today and [facility] her request. She already has stool softener at home that she prefers.   She is awaiting coreg refill from Dr. Claris Gladden office and wishes for them to notify her when it is ready and try to send to whatever pharmacy is cheaper, [facility]. I will forward this note to Dr. Aundra Dubin and also to RN who can try to contact their office.  The prescription order is pended with their system so I cannot change it at this time but will likely be filled soon. If any issues, I have advised her to call the after hours pager if needed if medication is not at her pharmacy.

## 2014-08-19 ENCOUNTER — Ambulatory Visit (HOSPITAL_COMMUNITY)
Admission: RE | Admit: 2014-08-19 | Discharge: 2014-08-19 | Disposition: A | Discharge status: Home / Self Care | Payer: Self-pay | Source: Ambulatory Visit | Attending: Cardiology | Admitting: Cardiology

## 2014-08-19 ENCOUNTER — Encounter (HOSPITAL_COMMUNITY): Payer: Self-pay

## 2014-08-19 VITALS — BP 98/64 | HR 65 | Wt 327.8 lb

## 2014-08-19 DIAGNOSIS — Z6841 Body Mass Index (BMI) 40.0 and over, adult: Secondary | ICD-10-CM

## 2014-08-19 DIAGNOSIS — I5042 Chronic combined systolic (congestive) and diastolic (congestive) heart failure: Secondary | ICD-10-CM

## 2014-08-19 DIAGNOSIS — G4733 Obstructive sleep apnea (adult) (pediatric): Secondary | ICD-10-CM

## 2014-08-19 NOTE — Patient Instructions (Signed)
Follow up 3 months with echocardiogram.  Happy New Year!  Do the following things EVERYDAY: 1) Weigh yourself in the morning before breakfast. Write it down and keep it in a log. 2) Take your medicines as prescribed 3) Eat low salt foods-Limit salt (sodium) to 2000 mg per day.  4) Stay as active as you can everyday 5) Limit all fluids for the day to less than 2 liters

## 2014-08-21 NOTE — Progress Notes (Signed)
[name], female   DOB: [birth year], 64 y.o.   MRN: [id number] Referring Physician: Dr. Eula Hardin Primary Care: Dr Robin Hardin Primary Cardiologist: Dr. Aundra Hardin HPI:  Robin Hardin is a 64 y.o. AAF with history of morbid obesity, HTN, and diastolic HF diagnosed in 6599.  She was eventually diagnosed with systolic heart failure by v-gram (09/2012) with an EF approximately 25%.  LHC showed no significant coronary disease.  She also has OSA with CPAP.   She had stopped taking her fluid pills in Feb 2014 due to cramping and ended up being admitted with respiratory failure.  She was diuresed ~40 pounds.  Her torsemide was changed to lasix to avoid cramping.  Repeat echo during admission showed 01/05/13  LVEF 25%. Discharge weight 387 lbs. Echo in 12/14 showed EF 40-45% with mild LV dilation.   She returns for follow up. She is limited by knee and back pain.  She uses a wheelchair when she leaves the house.  No dyspnea walking in house but not very active.  No chest pain.  Left shoulder hurts with abduction.  No orthopnea.  Does not leave house much. She is not using CPAP.  Weight is down 11 lbs.    Labs (7/14): K 3.9 Creatinine 0.91, LDL 105 Labs (06/05/13): K 3.7 Creatinine 0.87 Pro BNP 56  Labs (1/15): K 4, creatinine 0.9 Labs (07/27/14): K 4.2 Creatinine 0.92 Hgb 11.8 HIV NR  ECG: sinus brady at 53, diffuse TW flattening  Past Medical History:  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 She was hospitalized at [facility] in [place] where she had a cath showing normal coronaries but EF 30-35% on LV-gram. She has been hospitalized two other times since then in [place] for CHF. Echo (8/10) showed EF 45-50%, mild global HK, mild LVH, grade I diastolic dysfunction, no significant valvular problems.  Echo (2/14) with EF severely decreased (poor windows so not quantified, appears about 25%).  Echo (12/14) with EF 40-45%, mild LV dilation, moderate LVH, moderate LAE.  2.  HTN  3. Depression  4. Morbid obesity  5. Hyperlipidemia  6. OSA: sporadic with CPAP  7. Gout on allopurinol  8. Bilateral knee pain, probable osteoarthritis  9. Diabetes type II  10. Left lower leg cellulitis in 10/11 with normal [place] for DVT.   Family History:  No FH of premature CAD  asthma: mother, 2 brothers  heart disease: mother  clotting disorders: mother (stroke)   Social History:  Recently from [place] to [place].  Married with 2 adopted children.  Never smoked, no ETOH or drugs.  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   Review of Systems  All systems reviewed and negative except as per HPI.    Current Outpatient Prescriptions  Medication Sig Dispense Refill  . allopurinol (ZYLOPRIM) 300 MG tablet Take 1 tablet (300 mg total) by mouth daily. 90 tablet 5  . aspirin EC 81 MG EC tablet Take 1 tablet (81 mg total) by mouth daily. 30 tablet 2  . carvedilol (COREG) 25 MG tablet TAKE ONE TABLET BY MOUTH TWICE DAILY WITH MEALS 60 tablet 3  . ferrous sulfate 325 (65 FE) MG tablet Take 1 tablet (325 mg total) by mouth 3 (three) times daily with meals. 90 tablet 3  . FLUoxetine (PROZAC) 40 MG capsule Take 2 capsules (80 mg total) by mouth daily. 60 capsule 5  . furosemide (LASIX) 80 MG tablet TAKE ONE TABLET BY MOUTH TWICE DAILY 180  tablet 0  . HYDROcodone-acetaminophen (NORCO/VICODIN) 5-325 MG per tablet Take 1 tablet by mouth every 6 (six) hours as needed for severe pain. 120 tablet 0  . lisinopril (PRINIVIL,ZESTRIL) 20 MG tablet Take 1 tablet (20 mg total) by mouth daily. 90 tablet 5  . lovastatin (MEVACOR) 20 MG tablet TAKE TWO TABLETS BY MOUTH ONCE DAILY AT BEDTIME 60 tablet 3  . metFORMIN (GLUCOPHAGE) 500 MG tablet Take 1 tablet (500 mg total) by mouth 2 (two) times daily with a meal. 60 tablet 11  . spironolactone (ALDACTONE) 25 MG tablet Take 1 tablet (25 mg total) by mouth daily. 30 tablet 6  . zolpidem (AMBIEN) 10 MG tablet Take 0.5 tablets (5 mg  total) by mouth at bedtime as needed for sleep. 30 tablet 0   No current facility-administered medications for this encounter.    No Known Allergies   Filed Vitals:   08/19/14 1438  BP: 98/64  Pulse: 65  Weight: 327 lb 12.8 oz (148.689 kg)  SpO2: 93%    PHYSICAL EXAM: General:  Markedly obese. Sitting in [place]. No respiratory difficulty, daughter present HEENT: normal Neck: supple. Unable to clearly see JVP but appears flat; Carotids 2+ bilat; no bruits. No lymphadenopathy or thryomegaly appreciated. Cor: PMI nonpalpable. Regular rate & rhythm. 1/6 systolic murmur LLSB. Lungs: clear Abdomen: markedly obese, soft, nontender, nondistended.  No bruits or masses. Good bowel sounds. Extremities: no cyanosis, clubbing, rash, edema. Neuro: alert & oriented x 3, cranial nerves grossly intact. moves all 4 extremities w/o difficulty. Affect pleasant.  ASSESSMENT & PLAN: 1. Chronic systolic CHF: Last echo 56/86 with EF 40-45%. Out of range for ICD. NYHA II symptoms but very limited by back and knee pain.   - Continue lasix 80 mg BID and spironolactone 25 mg daily - Continue  Coreg to 25 mg bid.  - Continue lisinopril 20 mg daily - Followup in 3 month with repeat echo.  - Reinforced the need and importance of daily weights, a low sodium diet, and fluid restriction (less than 2 L a day). Instructed to call the HF clinic if weight increases more than 3 lbs overnight or 5 lbs in a week.  2. OSA: Not using CPAP. Will refer back to Dr Robin Hardin.  3. Obesity: Weight continues to trend down but she has a long way to go. 4. Shoulder pain: Likely musculoskeletal. ? Rotator cuff  Robin Hardin 08/21/2014

## 2014-08-24 ENCOUNTER — Other Ambulatory Visit: Payer: Self-pay | Admitting: Internal Medicine

## 2014-09-07 ENCOUNTER — Other Ambulatory Visit: Payer: Self-pay | Admitting: Internal Medicine

## 2014-09-08 ENCOUNTER — Other Ambulatory Visit: Payer: Self-pay | Admitting: *Deleted

## 2014-09-09 MED ORDER — HYDROCODONE-ACETAMINOPHEN 5-325 MG PO TABS: 1.0000 | ORAL_TABLET | Freq: Four times a day (QID) | ORAL | Status: DC | PRN

## 2014-09-11 ENCOUNTER — Other Ambulatory Visit: Payer: Self-pay | Admitting: Internal Medicine

## 2014-09-13 ENCOUNTER — Other Ambulatory Visit: Payer: Self-pay | Admitting: *Deleted

## 2014-09-13 MED ORDER — ZOLPIDEM TARTRATE 10 MG PO TABS: 5.0000 mg | ORAL_TABLET | Freq: Every evening | ORAL | Status: DC | PRN

## 2014-09-13 NOTE — Telephone Encounter (Signed)
Pt aware, rx ready for pickup.Robin Hidden Cassady1/25/20164:23 PM

## 2014-09-13 NOTE — Telephone Encounter (Signed)
Per pt's request-rx phoned into [facility].Robin Hidden Cassady1/25/20164:23 PM

## 2014-09-27 ENCOUNTER — Other Ambulatory Visit: Payer: Self-pay | Admitting: *Deleted

## 2014-09-27 MED ORDER — HYDROCODONE-ACETAMINOPHEN 5-325 MG PO TABS: 1.0000 | ORAL_TABLET | Freq: Four times a day (QID) | ORAL | Status: DC | PRN

## 2014-09-27 NOTE — Telephone Encounter (Signed)
Pt was informed by email

## 2014-10-18 ENCOUNTER — Other Ambulatory Visit: Payer: Self-pay | Admitting: Internal Medicine

## 2014-10-26 ENCOUNTER — Encounter: Payer: Self-pay | Admitting: Internal Medicine

## 2014-10-28 ENCOUNTER — Encounter: Payer: Self-pay | Admitting: Internal Medicine

## 2014-11-09 ENCOUNTER — Encounter: Payer: Self-pay | Admitting: Internal Medicine

## 2014-11-19 ENCOUNTER — Other Ambulatory Visit: Payer: Self-pay | Admitting: *Deleted

## 2014-11-19 ENCOUNTER — Other Ambulatory Visit: Payer: Self-pay | Admitting: Internal Medicine

## 2014-11-19 MED ORDER — HYDROCODONE-ACETAMINOPHEN 5-325 MG PO TABS: 1.0000 | ORAL_TABLET | Freq: Four times a day (QID) | ORAL | Status: DC | PRN

## 2014-11-19 MED ORDER — FERROUS SULFATE 325 (65 FE) MG PO TABS: 325.0000 mg | ORAL_TABLET | Freq: Three times a day (TID) | ORAL | Status: AC

## 2014-11-19 MED ORDER — FERROUS SULFATE 325 (65 FE) MG PO TABS: 325.0000 mg | ORAL_TABLET | Freq: Three times a day (TID) | ORAL | Status: DC

## 2014-11-19 MED ORDER — METFORMIN HCL 500 MG PO TABS: 500.0000 mg | ORAL_TABLET | Freq: Two times a day (BID) | ORAL | Status: DC

## 2014-11-19 NOTE — Telephone Encounter (Signed)
Message left for pt the Rx was ready for pick-up

## 2014-11-19 NOTE — Telephone Encounter (Signed)
Last refill Vicodin 2/21  -  Rx for iron needs to be written out so [facility]. Metformin goes to [id number].

## 2014-12-15 ENCOUNTER — Other Ambulatory Visit: Payer: Self-pay | Admitting: *Deleted

## 2014-12-15 ENCOUNTER — Telehealth: Payer: Self-pay | Admitting: *Deleted

## 2014-12-15 MED ORDER — CARVEDILOL 25 MG PO TABS: 25.0000 mg | ORAL_TABLET | Freq: Two times a day (BID) | ORAL | Status: DC

## 2014-12-15 MED ORDER — SPIRONOLACTONE 25 MG PO TABS: 25.0000 mg | ORAL_TABLET | Freq: Every day | ORAL | Status: DC

## 2014-12-15 MED ORDER — FUROSEMIDE 80 MG PO TABS: 80.0000 mg | ORAL_TABLET | Freq: Two times a day (BID) | ORAL | Status: DC

## 2014-12-15 NOTE — Telephone Encounter (Signed)
Informed pt refills have been done x 1 month and she needs to call to schedule an appt wDr Aundra Dubin ,her cardiologist. Stated she has his telephone# and will call herself.

## 2014-12-15 NOTE — Telephone Encounter (Signed)
-----  Message from Wilber Oliphant, MD sent at 12/15/2014 11:24 AM EDT ----- Regarding: meds, needs cards appt Hi glenda,  i refilled her meds that you sent me for 1 month. She was supposed to have a cards appt this month and they likely will adjust her medications. Please let her know to make follow up appt as soon as possible with them or we can help set that up.   Thanks  Dr q

## 2014-12-28 ENCOUNTER — Ambulatory Visit (INDEPENDENT_AMBULATORY_CARE_PROVIDER_SITE_OTHER): Payer: Self-pay | Admitting: Internal Medicine

## 2014-12-28 ENCOUNTER — Encounter: Payer: Self-pay | Admitting: Internal Medicine

## 2014-12-28 VITALS — BP 116/63 | HR 61 | Temp 98.2°F | Wt 331.3 lb

## 2014-12-28 DIAGNOSIS — M1710 Unilateral primary osteoarthritis, unspecified knee: Secondary | ICD-10-CM

## 2014-12-28 DIAGNOSIS — F329 Major depressive disorder, single episode, unspecified: Secondary | ICD-10-CM

## 2014-12-28 DIAGNOSIS — Z0000 Encounter for general adult medical examination without abnormal findings: Secondary | ICD-10-CM

## 2014-12-28 DIAGNOSIS — G4733 Obstructive sleep apnea (adult) (pediatric): Secondary | ICD-10-CM

## 2014-12-28 DIAGNOSIS — I5042 Chronic combined systolic (congestive) and diastolic (congestive) heart failure: Secondary | ICD-10-CM

## 2014-12-28 DIAGNOSIS — Z6841 Body Mass Index (BMI) 40.0 and over, adult: Secondary | ICD-10-CM

## 2014-12-28 DIAGNOSIS — R5383 Other fatigue: Secondary | ICD-10-CM

## 2014-12-28 DIAGNOSIS — I10 Essential (primary) hypertension: Secondary | ICD-10-CM

## 2014-12-28 DIAGNOSIS — M170 Bilateral primary osteoarthritis of knee: Secondary | ICD-10-CM

## 2014-12-28 DIAGNOSIS — F32A Depression, unspecified: Secondary | ICD-10-CM

## 2014-12-28 DIAGNOSIS — R5382 Chronic fatigue, unspecified: Secondary | ICD-10-CM | POA: Insufficient documentation

## 2014-12-28 DIAGNOSIS — D649 Anemia, unspecified: Secondary | ICD-10-CM | POA: Insufficient documentation

## 2014-12-28 DIAGNOSIS — E119 Type 2 diabetes mellitus without complications: Secondary | ICD-10-CM

## 2014-12-28 LAB — BASIC METABOLIC PANEL WITH GFR
BUN: 77 mg/dL — ABNORMAL HIGH (ref 6–23)
CHLORIDE: 99 meq/L (ref 96–112)
CO2: 25 meq/L (ref 19–32)
Calcium: 9.4 mg/dL (ref 8.4–10.5)
Creat: 2.57 mg/dL — ABNORMAL HIGH (ref 0.50–1.10)
GFR, EST NON AFRICAN AMERICAN: 19 mL/min — AB
GFR, Est African American: 22 mL/min — ABNORMAL LOW
Glucose, Bld: 99 mg/dL (ref 70–99)
Potassium: 5 mEq/L (ref 3.5–5.3)
Sodium: 139 mEq/L (ref 135–145)

## 2014-12-28 LAB — CBC WITH DIFFERENTIAL/PLATELET
BASOS PCT: 0 % (ref 0–1)
Basophils Absolute: 0 10*3/uL (ref 0.0–0.1)
Eosinophils Absolute: 0.2 10*3/uL (ref 0.0–0.7)
Eosinophils Relative: 4 % (ref 0–5)
HEMATOCRIT: 36.5 % (ref 36.0–46.0)
HEMOGLOBIN: 12.1 g/dL (ref 12.0–15.0)
LYMPHS ABS: 1.1 10*3/uL (ref 0.7–4.0)
LYMPHS PCT: 27 % (ref 12–46)
MCH: 30 pg (ref 26.0–34.0)
MCHC: 33.2 g/dL (ref 30.0–36.0)
MCV: 90.6 fL (ref 78.0–100.0)
MONO ABS: 0.3 10*3/uL (ref 0.1–1.0)
MONOS PCT: 7 % (ref 3–12)
MPV: 10 fL (ref 8.6–12.4)
NEUTROS ABS: 2.4 10*3/uL (ref 1.7–7.7)
Neutrophils Relative %: 62 % (ref 43–77)
Platelets: 239 10*3/uL (ref 150–400)
RBC: 4.03 MIL/uL (ref 3.87–5.11)
RDW: 13.4 % (ref 11.5–15.5)
WBC: 3.9 10*3/uL — AB (ref 4.0–10.5)

## 2014-12-28 LAB — POCT GLYCOSYLATED HEMOGLOBIN (HGB A1C): HEMOGLOBIN A1C: 5.5

## 2014-12-28 LAB — IRON AND TIBC
%SAT: 25 % (ref 20–55)
Iron: 79 ug/dL (ref 42–145)
TIBC: 322 ug/dL (ref 250–470)
UIBC: 243 ug/dL (ref 125–400)

## 2014-12-28 LAB — FERRITIN: Ferritin: 104 ng/mL (ref 10–291)

## 2014-12-28 LAB — TSH: TSH: 1.295 u[IU]/mL (ref 0.350–4.500)

## 2014-12-28 LAB — GLUCOSE, CAPILLARY: Glucose-Capillary: 103 mg/dL — ABNORMAL HIGH (ref 70–99)

## 2014-12-28 MED ORDER — TRAZODONE HCL 50 MG PO TABS: 25.0000 mg | ORAL_TABLET | Freq: Every day | ORAL | Status: DC

## 2014-12-28 NOTE — Assessment & Plan Note (Signed)
Lab Results  Component Value Date   HGBA1C 5.5 12/28/2014   HGBA1C 5.4 07/27/2014   HGBA1C 5.4 08/25/2013     Assessment: Diabetes control:   under control Progress toward A1C goal:   controlled Comments: HbA1C 5.5.   Plan: Medications:  continue current medications metformin 545m bid Instruction/counseling given: discussed the need for weight loss Educational resources provided: brochure, handout Self management tools provided:   Other plans: patient refused eye exam today due to cost.

## 2014-12-28 NOTE — Patient Instructions (Addendum)
General Instructions: Dear Ms. Brumley,  Thank you for coming in today.   You have decided to try trazodone today, start with low dose 93m at night and continue prozac. If you have any side effects including but not limited to worsening of depression, suicidal or homicidal ideation, chest pain, shortness of braeth, dizziness, feeling like you will faint or you do faint, confusion, restlessness, sweating, agitation, muscle rigidity, stop use immediately and call [facility] [phone] go to emergency room. Please review the material below for trazodone and possible serotonin syndrome.  Please follow up with Dr. MClaris Gladdenoffice--next appointment scheduled for 6/16 at 11am  Please continue to work on your weight loss and exercise as tolerated  Please follow up with Dr. CGwenette Greetper Dr. MClaris Gladdenreferral for your CPAP  I will call you with the results of your iron studies to let you know if you need to continue to take iron tablets  Return to see me next month  Please bring your medicines with you each time you come to clinic.  Medicines may include prescription medications, over-the-counter medications, herbal remedies, eye drops, vitamins, or other pills.   Progress Toward Treatment Goals:  Treatment Goal 07/27/2014  Blood pressure at goal  Prevent falls -    Self Care Goals & Plans:  Self Care Goal 12/28/2014  Manage my medications take my medicines as prescribed; bring my medications to every visit; refill my medications on time; follow the sick day instructions if I am sick  Monitor my health keep track of my blood pressure; check my feet daily  Eat healthy foods eat more vegetables; eat fruit for snacks and desserts; eat baked foods instead of fried foods; eat foods that are low in salt; eat smaller portions; drink diet soda or water instead of juice or soda  Be physically active find an activity I enjoy    No flowsheet data found.   Care Management & Community  Referrals:  Referral 06/02/2013  Referrals made to community resources weight management; exercise/physical therapy; nutrition     Trazodone tablets What is this medicine? TRAZODONE (TRAZ oh done) is used to treat depression. This medicine may be used for other purposes; ask your health care provider or pharmacist if you have questions. COMMON BRAND NAME(S): Desyrel What should I tell my health care provider before I take this medicine? They need to know if you have any of these conditions: -attempted suicide or thinking about it -bipolar disorder -bleeding problems -glaucoma -heart disease, or previous heart attack -irregular heart beat -kidney or liver disease -low levels of sodium in the blood -an unusual or allergic reaction to trazodone, other medicines, foods, dyes or preservatives -pregnant or trying to get pregnant -breast-feeding How should I use this medicine? Take this medicine by mouth with a glass of water. Follow the directions on the prescription label. Take this medicine shortly after a meal or a light snack. Take your medicine at regular intervals. Do not take your medicine more often than directed. Do not stop taking this medicine suddenly except upon the advice of your doctor. Stopping this medicine too quickly may cause serious side effects or your condition may worsen. A special MedGuide will be given to you by the pharmacist with each prescription and refill. Be sure to read this information carefully each time. Talk to your pediatrician regarding the use of this medicine in children. Special care may be needed. Overdosage: If you think you have taken too much of this medicine contact  a poison control center or emergency room at once. NOTE: This medicine is only for you. Do not share this medicine with others. What if I miss a dose? If you miss a dose, take it as soon as you can. If it is almost time for your next dose, take only that dose. Do not take double or  extra doses. What may interact with this medicine? Do not take this medicine with any of the following medications: -certain medicines for fungal infections like fluconazole, itraconazole, ketoconazole, posaconazole, voriconazole -cisapride -dofetilide -dronedarone -linezolid -MAOIs like Carbex, Eldepryl, Marplan, Nardil, and Parnate -mesoridazine -methylene blue (injected into a vein) -pimozide -saquinavir -thioridazine -ziprasidone This medicine may also interact with the following medications: -alcohol -antiviral medicines for HIV or AIDS -aspirin and aspirin-like medicines -barbiturates like phenobarbital -certain medicines for blood pressure, heart disease, irregular heart beat -certain medicines for depression, anxiety, or psychotic disturbances -certain medicines for migraine headache like almotriptan, eletriptan, frovatriptan, naratriptan, rizatriptan, sumatriptan, zolmitriptan -certain medicines for seizures like carbamazepine and phenytoin -certain medicines for sleep -certain medicines that treat or prevent blood clots like dalteparin, enoxaparin, warfarin -digoxin -fentanyl -lithium -NSAIDS, medicines for pain and inflammation, like ibuprofen or naproxen -other medicines that prolong the QT interval (cause an abnormal heart rhythm) -rasagiline -supplements like St. John's wort, kava kava, valerian -tramadol -tryptophan This list may not describe all possible interactions. Give your health care provider a list of all the medicines, herbs, non-prescription drugs, or dietary supplements you use. Also tell them if you smoke, drink alcohol, or use illegal drugs. Some items may interact with your medicine. What should I watch for while using this medicine? Tell your doctor if your symptoms do not get better or if they get worse. Visit your doctor or health care professional for regular checks on your progress. Because it may take several weeks to see the full effects of  this medicine, it is important to continue your treatment as prescribed by your doctor. Patients and their families should watch out for new or worsening thoughts of suicide or depression. Also watch out for sudden changes in feelings such as feeling anxious, agitated, panicky, irritable, hostile, aggressive, impulsive, severely restless, overly excited and hyperactive, or not being able to sleep. If this happens, especially at the beginning of treatment or after a change in dose, call your health care professional. Dennis Bast may get drowsy or dizzy. Do not drive, use machinery, or do anything that needs mental alertness until you know how this medicine affects you. Do not stand or sit up quickly, especially if you are an older patient. This reduces the risk of dizzy or fainting spells. Alcohol may interfere with the effect of this medicine. Avoid alcoholic drinks. This medicine may cause dry eyes and blurred vision. If you wear contact lenses you may feel some discomfort. Lubricating drops may help. See your eye doctor if the problem does not go away or is severe. Your mouth may get dry. Chewing sugarless gum, sucking hard candy and drinking plenty of water may help. Contact your doctor if the problem does not go away or is severe. What side effects may I notice from receiving this medicine? Side effects that you should report to your doctor or health care professional as soon as possible: -allergic reactions like skin rash, itching or hives, swelling of the face, lips, or tongue -fast, irregular heartbeat -feeling faint or lightheaded, falls -painful erections or other sexual dysfunction -suicidal thoughts or other mood changes -trembling Side effects that usually do  not require medical attention (report to your doctor or health care professional if they continue or are bothersome): -constipation -headache -muscle aches or pains -nausea, vomiting -unusually weak or tired This list may not describe all  possible side effects. Call your doctor for medical advice about side effects. You may report side effects to FDA at 1-800-FDA-1088. Where should I keep my medicine? Keep out of the reach of children. Store at room temperature between 15 and 30 degrees C (59 to 86 degrees F). Protect from light. Keep container tightly closed. Throw away any unused medicine after the expiration date. NOTE: This sheet is a summary. It may not cover all possible information. If you have questions about this medicine, talk to your doctor, pharmacist, or health care provider.  2015, Elsevier/Gold Standard. (2013-03-09 15:46:28)  Serotonin Syndrome Serotonin is a brain chemical that regulates the nervous system. Some kinds of drugs increase the amount of serotonin in your body. Drugs that increase the serotonin in your body include:   Anti-depressant medications.  St. John's wort.  Recreational drugs.  Migraine medicines.  Some pain medicines. SYMPTOMS Combining these drugs increases the risk that you will become ill with a toxic condition called serotonin syndrome.  Symptoms of too much serotonin include:  Confusion.  Agitation.  Weakness.  Insomnia.  Fever.  Sweats. Other symptoms that may develop include:  Shakiness.  Muscle spasms.  Seizures. TREATMENT  Hospital treatment is often needed until the effects are controlled.  Avoiding the combination of medicines listed above is recommended.  Check with your doctor if you are concerned about your medicine or the side effects. Document Released: 09/13/2004 Document Revised: 10/29/2011 Document Reviewed: 08/06/2005 [facility] Patient Information 2015 [place], [place]. This information is not intended to replace advice given to you by your health care provider. Make sure you discuss any questions you have with your health care provider.

## 2014-12-28 NOTE — Assessment & Plan Note (Signed)
Dr. Claris Gladden note in January notes referral back to Dr. Gwenette Greet. She does not appear to be seen there yet. Hopefully this can be achieved soon although patient does not have orange card or any insurance at this time. Will try to see if cardiology office was able to place referral.

## 2014-12-28 NOTE — Assessment & Plan Note (Addendum)
Possibly secondary to depression. She reports generalized fatigue recently and also feeling depressed. Will check labs for anemia and iron panel (has not been taking her iron supplements) as well in addition to TSH. Added trazodone for depression and follow up 1 month to see if any improvement or sooner if needed.   Would also monitor HR, initially noted to be high 50s but improved to low 60s on repeat. She denies syncope or dizziness but possible contribution of bradycardia to symptoms.

## 2014-12-28 NOTE — Assessment & Plan Note (Signed)
Weight up 4 pounds on our scale. She reports DOE but denies chest pain. Compliant with medications. Due for repeat echo and visit with HF team.   -called Dr. Claris Gladden office for appointment and they were able to schedule one for 6/16 at 11am--notified patient of appointment -cardiology office to arrange echo or we can place order if needed -daily weights and asked to notify if weight changing by 3 pounds or worsening of symptoms

## 2014-12-28 NOTE — Assessment & Plan Note (Signed)
Refused eye exam today. She is already on ACEi and she says she cannot afford additional testing at this time, thus urine microalbumin not checked today. She needs a colonoscopy but currently has no insurance or orange card. Will try to see if any other options for her available. She deferred eye exam today and is due for mammogram.   -will ask RN if we can schedule her mammogram or scholarship program and possible options for colonoscopy

## 2014-12-28 NOTE — Progress Notes (Signed)
Subjective:   [name] female   DOB: 16-Jun-1951 64 y.o.   MRN: [id number]  HPI: [name] is a 64 y.o. female with extensive PMH as listed below presents to opc today for routine visit.   Health maintenance--she is due for a mammogram and eye exam along with urine microalbumin.   CHF--last seen by Dr. Aundra Dubin 08/2014 and overdue for follow up. She is compliant with her medications. Weight up 4 pounds from December, but says home scale said 327lbs.   Depression--she denies suicidal or homicidal ideation but still feels like lack of motivation and energy to do anything and has moments where she feels very down, such as the night of Mother's day. She is able to get out of the house more as she is driving her husband to his doctor appointments and her daughter to school, however, she says if she didn't have to she wouldn't. She is requesting additional medication to help with depression.  Past Medical History  Diagnosis Date  . Hypertension   . Diabetes mellitus   . Depression   . Nonischemic cardiomyopathy   . Morbid obesity   . Gout   . Bilateral knee pain   . Cellulitis of lower leg     left  . H/O: hysterectomy   . Unspecified essential hypertension 03/22/2009    Qualifier: Diagnosis of  By: Karrie Meres RN, BSN, [name]    . Acute respiratory failure with hypoxia 09/26/2012  . Chronic kidney disease 10/10/2012    Stage 2 with GFR of 64   . Malignant hypertension 09/26/2012  . Chronic combined systolic and diastolic congestive heart failure 10/10/2012    2 D echo 09/2012:  Left ventricle: Extremely poor acoustic windows limit study. Overall LVEF appears to be severely depressed. Recomm ordering limited study with contrast to further evaluate LV systolic function. The cavity size was normal. Wall thickness was normal. Doppler parameters are consistent with abnormal left ventricular relaxation (grade 1 diastolic dysfunction).   2 D echo 03/2009 :  Left ventricle: The  cavity size was normal. There was mild concentric hypertrophy. Systolic function was mildly reduced. The estimated ejection fraction was in the range of 45% to 50%. Diffuse hypokinesis. Doppler parameters are consistent with abnormal left ventricular relaxation (grade 1 diastolic dysfunction).     . Hyperlipidemia   . Sleep apnea     on cpap   Current Outpatient Prescriptions  Medication Sig Dispense Refill  . allopurinol (ZYLOPRIM) 300 MG tablet Take 1 tablet (300 mg total) by mouth daily. 90 tablet 5  . aspirin EC 81 MG EC tablet Take 1 tablet (81 mg total) by mouth daily. 30 tablet 2  . carvedilol (COREG) 25 MG tablet Take 1 tablet (25 mg total) by mouth 2 (two) times daily with a meal. 60 tablet 0  . ferrous sulfate 325 (65 FE) MG tablet Take 1 tablet (325 mg total) by mouth 3 (three) times daily with meals. 90 tablet 0  . FLUoxetine (PROZAC) 40 MG capsule Take 2 capsules (80 mg total) by mouth daily. 60 capsule 5  . furosemide (LASIX) 80 MG tablet Take 1 tablet (80 mg total) by mouth 2 (two) times daily. 60 tablet 0  . HYDROcodone-acetaminophen (NORCO/VICODIN) 5-325 MG per tablet Take 1 tablet by mouth every 6 (six) hours as needed for severe pain. 120 tablet 0  . lisinopril (PRINIVIL,ZESTRIL) 20 MG tablet Take 1 tablet (20 mg total) by mouth daily. 90 tablet 5  . lovastatin (MEVACOR) 20  MG tablet TAKE TWO TABLETS BY MOUTH ONCE DAILY AT BEDTIME 60 tablet 3  . metFORMIN (GLUCOPHAGE) 500 MG tablet Take 1 tablet (500 mg total) by mouth 2 (two) times daily with a meal. 60 tablet 5  . spironolactone (ALDACTONE) 25 MG tablet Take 1 tablet (25 mg total) by mouth daily. 30 tablet 0  . zolpidem (AMBIEN) 10 MG tablet Take 0.5 tablets (5 mg total) by mouth at bedtime as needed for sleep. 30 tablet 1   No current facility-administered medications for this visit.   Family History  Problem Relation Age of Onset  . Asthma Mother   . Asthma Brother   . Asthma Brother   . Heart disease Mother   .  Stroke Mother     clotting disorders   History   Social History  . Marital Status: Married    Spouse Name: N/A  . Number of Children: 2  . Years of Education: N/A   Occupational History  . retired cna    Social History Main Topics  . Smoking status: Never Smoker   . Smokeless tobacco: Not on file  . Alcohol Use: No  . Drug Use: No  . Sexual Activity: Not on file   Other Topics Concern  . None   Social History Narrative   Review of Systems:  Constitutional:  Fatigue.   Respiratory:  Denies SOB. +DOE  Cardiovascular:  Denies chest pain  Gastrointestinal:  Denies nausea, vomiting, abdominal pain  Genitourinary:  Denies dysuria  Musculoskeletal:  Chronic knee pain and back pain  Neurological:  Denies dizziness, or headaches.    Objective:  Physical Exam: Filed Vitals:   12/28/14 1427  BP: 96/59  Pulse: 56  Temp: 98.2 F (36.8 C)  TempSrc: Oral  SpO2: 96%   Vitals reviewed. General: sitting in wheelchair, NAD HEENT: EOMI Cardiac: RRR, +SEM 2/6 loudest LUSB Pulm: clear to auscultation bilaterally Abd: soft, nontender, obese, BS present Ext: -edema, obese extremities, moving all extremities Neuro: alert and oriented X3  Assessment & Plan:  Discussed with Dr. Daryll Drown Added low dose trazodone

## 2014-12-28 NOTE — Assessment & Plan Note (Addendum)
Reports fatigue today. Has not been taking her iron supplements which she thinks could be contributing.   -repeat cbc -check iron panel -needs colonoscopy if able to afford and if agrees -she did pick up her prescription for iron today but may be able to d/c if no iron deficiency. Last iron panel 08/2013 with iron 56 and ferritin 58

## 2014-12-28 NOTE — Assessment & Plan Note (Signed)
Weight up 4 pounds today but says on her home scale weight was 327lb's.   -continued to encourage weight loss, lifestyle modifications, and exercise as tolerated

## 2014-12-28 NOTE — Assessment & Plan Note (Signed)
Patient continues to report decreased mood and energy and feeling down and depressed and requesting additional medication to prozac (already on max dose). She is hesitant to try different SSRI due to concern for weight gain. We reviewed options today which are limited due to cost and side-effect profile and she is interested in trying trazodone that may help her with sleep as well.   -Discussed with Dr. Daryll Drown. Will start very low dose Trazodone 10m qhs in addition to prozac. Monitor carefully for possible serotonin syndrome. I have reviewed side-effect profile in clinic today extensively with patient including possible signs and symptoms of serotonin syndrome and she is asked to stop the medications immediately and call [facility] and go to emergency room if unable to get to clinic. She voices understanding and wishes to proceed with medications. She is also asked to notify [facility] and stop the medications if depression gets worse or any suicidal or homicidal thoughts and will have close clinic follow up in June with me or sooner if she wishes to see a different provider.  -discontinued ambien off medication list given now starting trazodone and patient has not been taking

## 2014-12-28 NOTE — Assessment & Plan Note (Signed)
BP Readings from Last 3 Encounters:  12/28/14 116/63  07/27/14 121/72  02/23/14 127/82    Lab Results  Component Value Date   NA 141 07/27/2014   K 4.2 07/27/2014   CREATININE 0.92 07/27/2014    Assessment: Blood pressure control:  (hypotensive today) initially but likely not accurate given wrist measurement, repeat on arm with large cuff, BP improved to 116/63 with HR 61 Progress toward BP goal:    controlled  Plan: Medications:  continue current medications spironolactone 92m, lasix 824mbid, lisinopril 2018mand coreg 3m16md Educational resources provided: brochure, handout, video Self management tools provided:   Other plans: will need to monitor HR and if symptomatic with bradycardia at times

## 2014-12-28 NOTE — Assessment & Plan Note (Signed)
She was unable to pick up her vicodin that was left with triage but has picked up today. Patient reports improvement of pain with mediations but hopefully this can be discontinued with continued weight loss and more exercise. If continued need, would recommend pain contract

## 2014-12-29 ENCOUNTER — Telehealth: Payer: Self-pay | Admitting: Internal Medicine

## 2014-12-29 NOTE — Telephone Encounter (Signed)
I called Robin Hardin today to review her recent lab work that shows rise in her Cr to 2.57.  Unclear why this may be the case as she has been on these medications for a while, however she reports getting different colored pills from the pharmacy for her spironolactone which she questioned but they verified the dose was correct. She also admits to not drinking much water lately. As such, for now, she is advised to HOLD her spironolactone and lisinopril and cut her lasix down to 80m daily. She is then to return to clinic on Friday for repeat lab work. I will also let Dr. MAundra Dubinknow of her renal function incase he wishes to adjust her medications further.   She still reports feeling tired which may be due to her renal dysfunction. Her iron panel was notable for iron level of 70 and ferritin of 104 and Hb improved to 12.1. Therefore, given that she has not taken iron for 1 month, we will monitor her off iron supplementation for now.   She voices understanding of lab results and was able to relay instructions of holding her medications back to me and will return on Friday.

## 2014-12-29 NOTE — Telephone Encounter (Signed)
Heather: Please arrange followup for [facility].

## 2014-12-29 NOTE — Telephone Encounter (Signed)
I called Robin Hardin back this evening after receiving further medication adjustment recommendation from Dr. Aundra Dubin who recommended stopping lisinopril and holding lasix entirely for 4 days then restarting at 29m daily. She will need to follow up with CHF clinic next week or if unable to do so or be scheduled, she is to return to opc for lab work and follow up early next week. I also messaged Dr. MAundra Dubinagain to confirm if he still wants to hold spironolactone as well and will touch base with patient once I hear from him. [name] understanding at this time of current plan to hold her diuretics and ACEi. She is understandably concerned about possible volume overload with holding of her medications, and I recommended for her to call the clinic either here or CHF clinic or the on call pager if needed if she feels like she is gaining weight or volume and we can adjust the medications accordingly at that time.

## 2014-12-29 NOTE — Telephone Encounter (Signed)
-----  Message from Larey Dresser, MD sent at 12/29/2014  5:21 PM EDT ----- Stop lisinopril.  Stop Lasix entirely for 4 days then restart at 80 mg once daily.  Followup in CHF clinic next week.

## 2014-12-30 ENCOUNTER — Encounter: Payer: Self-pay | Admitting: *Deleted

## 2014-12-30 ENCOUNTER — Telehealth (HOSPITAL_COMMUNITY): Payer: Self-pay | Admitting: Vascular Surgery

## 2014-12-30 NOTE — Progress Notes (Signed)
Pt has no insurance - needs to met with Bonna Gains. Pt aware order for mammogram is in Epic - needs to call [place] to sch and ask for scholarship. Hemoccult card sent to pt per Dr Eula Fried. Hilda Blades Verdelle Valtierra RN 12/30/14  11:20AM

## 2014-12-30 NOTE — Addendum Note (Signed)
Addended by: Wilber Oliphant on: 12/30/2014 10:44 AM   Modules accepted: Orders

## 2014-12-30 NOTE — Telephone Encounter (Signed)
Reviewed with resident physician Dr Otelia Sergeant on 12/29/14

## 2014-12-31 ENCOUNTER — Encounter (HOSPITAL_COMMUNITY): Payer: Self-pay | Admitting: Vascular Surgery

## 2015-01-03 ENCOUNTER — Telehealth (HOSPITAL_COMMUNITY): Payer: Self-pay | Admitting: Vascular Surgery

## 2015-01-03 NOTE — Progress Notes (Signed)
Internal Medicine Clinic Attending  Case discussed with Dr. Eula Fried at the time of the visit.  We reviewed the resident's history and exam and pertinent patient test results.  I agree with the assessment, diagnosis, and plan of care documented in the resident's note.

## 2015-01-04 ENCOUNTER — Other Ambulatory Visit: Payer: Self-pay | Admitting: Internal Medicine

## 2015-01-04 DIAGNOSIS — G4733 Obstructive sleep apnea (adult) (pediatric): Secondary | ICD-10-CM

## 2015-01-09 ENCOUNTER — Other Ambulatory Visit: Payer: Self-pay | Admitting: Internal Medicine

## 2015-01-09 ENCOUNTER — Encounter: Payer: Self-pay | Admitting: Internal Medicine

## 2015-01-10 ENCOUNTER — Other Ambulatory Visit: Payer: Self-pay | Admitting: *Deleted

## 2015-01-10 MED ORDER — LOVASTATIN 20 MG PO TABS: 20.0000 mg | ORAL_TABLET | Freq: Every day | ORAL | Status: DC

## 2015-01-10 MED ORDER — CARVEDILOL 25 MG PO TABS: 25.0000 mg | ORAL_TABLET | Freq: Two times a day (BID) | ORAL | Status: DC

## 2015-01-10 NOTE — Telephone Encounter (Signed)
i have called dr Claris Gladden office in heart failure, they have actually been trying to call pt and cannot reach her, i have tried both ph#'s and both have been disconnected, the one listed for her belongs to someone else now, the person who answered said she got this ph# recently and has gotten lots of calls for pt, pt does have an appt this Friday 5/27 at 27 dr Aundra Dubin in heart failure clinic. Will attempt to find a family ph#, will send an email and call pharmacy

## 2015-01-14 ENCOUNTER — Ambulatory Visit (HOSPITAL_COMMUNITY)
Admission: RE | Admit: 2015-01-14 | Discharge: 2015-01-14 | Disposition: A | Discharge status: Home / Self Care | Payer: Self-pay | Source: Ambulatory Visit | Attending: Cardiology | Admitting: Cardiology

## 2015-01-14 VITALS — BP 132/78 | HR 67 | Wt 328.5 lb

## 2015-01-14 DIAGNOSIS — F329 Major depressive disorder, single episode, unspecified: Secondary | ICD-10-CM | POA: Insufficient documentation

## 2015-01-14 DIAGNOSIS — E785 Hyperlipidemia, unspecified: Secondary | ICD-10-CM | POA: Insufficient documentation

## 2015-01-14 DIAGNOSIS — M109 Gout, unspecified: Secondary | ICD-10-CM | POA: Insufficient documentation

## 2015-01-14 DIAGNOSIS — G4733 Obstructive sleep apnea (adult) (pediatric): Secondary | ICD-10-CM | POA: Insufficient documentation

## 2015-01-14 DIAGNOSIS — I429 Cardiomyopathy, unspecified: Secondary | ICD-10-CM | POA: Insufficient documentation

## 2015-01-14 DIAGNOSIS — Z79899 Other long term (current) drug therapy: Secondary | ICD-10-CM | POA: Insufficient documentation

## 2015-01-14 DIAGNOSIS — N179 Acute kidney failure, unspecified: Secondary | ICD-10-CM | POA: Insufficient documentation

## 2015-01-14 DIAGNOSIS — E669 Obesity, unspecified: Secondary | ICD-10-CM | POA: Insufficient documentation

## 2015-01-14 DIAGNOSIS — I5022 Chronic systolic (congestive) heart failure: Secondary | ICD-10-CM | POA: Insufficient documentation

## 2015-01-14 DIAGNOSIS — E119 Type 2 diabetes mellitus without complications: Secondary | ICD-10-CM | POA: Insufficient documentation

## 2015-01-14 DIAGNOSIS — I10 Essential (primary) hypertension: Secondary | ICD-10-CM | POA: Insufficient documentation

## 2015-01-14 DIAGNOSIS — Z7982 Long term (current) use of aspirin: Secondary | ICD-10-CM | POA: Insufficient documentation

## 2015-01-14 DIAGNOSIS — I5042 Chronic combined systolic (congestive) and diastolic (congestive) heart failure: Secondary | ICD-10-CM

## 2015-01-14 DIAGNOSIS — Z6841 Body Mass Index (BMI) 40.0 and over, adult: Secondary | ICD-10-CM

## 2015-01-14 LAB — BASIC METABOLIC PANEL
ANION GAP: 11 (ref 5–15)
BUN: 28 mg/dL — AB (ref 6–20)
CALCIUM: 9.2 mg/dL (ref 8.9–10.3)
CHLORIDE: 104 mmol/L (ref 101–111)
CO2: 28 mmol/L (ref 22–32)
Creatinine, Ser: 1.1 mg/dL — ABNORMAL HIGH (ref 0.44–1.00)
GFR, EST NON AFRICAN AMERICAN: 52 mL/min — AB (ref >60)
Glucose, Bld: 91 mg/dL (ref 65–99)
POTASSIUM: 2.9 mmol/L — AB (ref 3.5–5.1)
SODIUM: 143 mmol/L (ref 135–145)

## 2015-01-14 LAB — BRAIN NATRIURETIC PEPTIDE: B Natriuretic Peptide: 37.3 pg/mL (ref 0.0–100.0)

## 2015-01-14 NOTE — Patient Instructions (Signed)
Follow up 1 month with echocardiogram.  Do the following things EVERYDAY: 1) Weigh yourself in the morning before breakfast. Write it down and keep it in a log. 2) Take your medicines as prescribed 3) Eat low salt foods-Limit salt (sodium) to 2000 mg per day.  4) Stay as active as you can everyday 5) Limit all fluids for the day to less than 2 liters

## 2015-01-16 DIAGNOSIS — N179 Acute kidney failure, unspecified: Secondary | ICD-10-CM | POA: Insufficient documentation

## 2015-01-16 NOTE — Progress Notes (Signed)
[name], female   DOB: November 10, 1950, 64 y.o.   MRN: [id number] Primary Care: Dr Eula Fried Primary Cardiologist: Dr. Aundra Dubin HPI:  Robin Hardin is a 64 y.o. AAF with history of morbid obesity, HTN, and diastolic HF diagnosed in 4098.  She was eventually diagnosed with systolic heart failure by v-gram (09/2012) with an EF approximately 25%.  LHC showed no significant coronary disease.  She also has OSA with CPAP.   She had stopped taking her fluid pills in Feb 2014 due to cramping and ended up being admitted with respiratory failure.  She was diuresed ~40 pounds.  Her torsemide was changed to lasix to avoid cramping.  Repeat echo during admission showed 01/05/13  LVEF 25%. Discharge weight 387 lbs. Echo in 12/14 showed EF 40-45% with mild LV dilation.   She returns for follow up. She is limited by knee and back pain.  She uses a wheelchair when she leaves the house.  Symptomatically, she has been stable.  Not dyspneic walking around the house but dyspneic with longer distances walking.  No chest pain.  No orthopnea/PND.  Weight stable. Earlier this month, she was noted to have creatinine up to 2.57, this is well above her baseline.  Symptoms were unchanged.  She denied NSAID use.  Lisinopril and spironolactone were stopped, Lasix was decreased to 80 mg daily.  She increased Lasix back to 80 mg bid when she began to gain weight.    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 She was hospitalized at [facility] in [place] where she had a cath showing normal coronaries but EF 30-35% on LV-gram. She has been hospitalized two other times since then in [place] for CHF. Echo (8/10) showed EF 45-50%, mild global HK,  mild LVH, grade I diastolic dysfunction, no significant valvular problems.  Echo (2/14) with EF severely decreased (poor windows so not quantified, appears about 25%).  Echo (12/14) with EF 40-45%, mild LV dilation, moderate LVH, moderate LAE.  2. HTN  3. Depression  4. Morbid obesity  5. Hyperlipidemia  6. OSA: sporadic with CPAP  7. Gout on allopurinol  8. Bilateral knee pain, probable osteoarthritis  9. Diabetes type II  10. Left lower leg cellulitis in 10/11 with normal [place] for DVT.   Family History:  No FH of premature CAD  asthma: mother, 2 brothers  heart disease: mother  clotting disorders: mother (stroke)   Social History:  Recently from [place] to [place].  Married with 2 adopted children.  Never smoked, no ETOH or drugs.  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   Review of Systems  All systems reviewed and negative except as per HPI.    Current Outpatient Prescriptions  Medication Sig Dispense Refill  . allopurinol (ZYLOPRIM) 300 MG tablet Take 1 tablet (300 mg total) by mouth daily. 90 tablet 5  . ALOE PO Take 1 capsule by mouth 2 (two) times daily.    Marland Kitchen aspirin EC 81 MG EC tablet Take 1 tablet (81 mg total) by mouth daily. 30 tablet 2  . carvedilol (COREG) 25 MG tablet Take 1 tablet (25 mg total) by mouth 2 (two) times daily with a meal. 60  tablet 1  . FLUoxetine (PROZAC) 40 MG capsule Take 2 capsules (80 mg total) by mouth daily. 60 capsule 5  . furosemide (LASIX) 80 MG tablet Take 1 tablet (80 mg total) by mouth 2 (two) times daily. 60 tablet 0  . lovastatin (MEVACOR) 20 MG tablet Take 1 tablet (20 mg total) by mouth at bedtime. 60 tablet 1  . metFORMIN (GLUCOPHAGE) 500 MG tablet Take 1 tablet (500 mg total) by mouth 2 (two) times daily with a meal. 60 tablet 5  . traZODone (DESYREL) 50 MG tablet Take 0.5 tablets (25 mg total) by mouth at bedtime. 30 tablet 0  . ferrous sulfate 325 (65 FE) MG tablet Take 1 tablet (325 mg total) by mouth 3 (three)  times daily with meals. (Patient not taking: Reported on 12/28/2014) 90 tablet 0  . HYDROcodone-acetaminophen (NORCO/VICODIN) 5-325 MG per tablet Take 1 tablet by mouth every 6 (six) hours as needed for severe pain. (Patient not taking: Reported on 12/28/2014) 120 tablet 0   No current facility-administered medications for this encounter.    No Known Allergies   Filed Vitals:   01/14/15 1548  BP: 132/78  Pulse: 67  Weight: 328 lb 8 oz (149.007 kg)  SpO2: 93%    PHYSICAL EXAM: General:  Markedly obese. Sitting in [place]. No respiratory difficulty, daughter present HEENT: normal Neck: supple. Unable to clearly see JVP but appears flat; Carotids 2+ bilat; no bruits. No lymphadenopathy or thryomegaly appreciated. Cor: PMI nonpalpable. Regular rate & rhythm. 1/6 systolic murmur LLSB. Lungs: clear Abdomen: markedly obese, soft, nontender, nondistended.  No bruits or masses. Good bowel sounds. Extremities: no cyanosis, clubbing, rash, edema. Neuro: alert & oriented x 3, cranial nerves grossly intact. moves all 4 extremities w/o difficulty. Affect pleasant.  ASSESSMENT & PLAN: 1. Chronic systolic CHF: Last echo 48/01 with EF 40-45%. Out of range for ICD. NYHA II symptoms but very limited by back and knee pain.  Recent AKI for uncertain reasons. - Continue Lasix 80 mg bid.  Check BMET/BNP today.  - Keep off lisinopril and spironolactone for now.  If creatinine is better on today's BMET, will restart lisinopril 5 mg daily.   - Continue  Coreg to 25 mg bid.  - I will order an echocardiogram.  - Reinforced the need and importance of daily weights, a low sodium diet, and fluid restriction (less than 2 L a day). Instructed to call the HF clinic if weight increases more than 3 lbs overnight or 5 lbs in a week.  2. OSA: Not using CPAP. Has appt with pulmonary.  3. Obesity: Needs to continue to work on weight loss.  4. AKI: ?etiology.  She was not taking NSAIDs.  No med change other than the new use of  an herbal medication from [place]. She does not know the name.  -  Stop the herbal medication. - As above, hold lisinopril and spironolactone unless creatinine is improved on today's check.   Followup in 1 month  Loralie Champagne 01/16/2015

## 2015-01-18 ENCOUNTER — Other Ambulatory Visit: Payer: Self-pay | Admitting: Internal Medicine

## 2015-01-19 ENCOUNTER — Other Ambulatory Visit (HOSPITAL_COMMUNITY): Payer: Self-pay | Admitting: *Deleted

## 2015-01-19 MED ORDER — LISINOPRIL 5 MG PO TABS: 5.0000 mg | ORAL_TABLET | Freq: Every day | ORAL | Status: DC

## 2015-01-19 MED ORDER — POTASSIUM CHLORIDE ER 20 MEQ PO TBCR: 40.0000 meq | EXTENDED_RELEASE_TABLET | Freq: Every day | ORAL | Status: DC

## 2015-01-20 ENCOUNTER — Ambulatory Visit (HOSPITAL_COMMUNITY)
Admission: RE | Admit: 2015-01-20 | Discharge: 2015-01-20 | Disposition: A | Discharge status: Home / Self Care | Payer: Self-pay | Source: Ambulatory Visit | Attending: Oncology | Admitting: Oncology

## 2015-01-20 ENCOUNTER — Encounter: Payer: Self-pay | Admitting: Internal Medicine

## 2015-01-20 ENCOUNTER — Ambulatory Visit (INDEPENDENT_AMBULATORY_CARE_PROVIDER_SITE_OTHER): Payer: Self-pay | Admitting: Internal Medicine

## 2015-01-20 VITALS — BP 122/82 | HR 66 | Temp 98.6°F | Wt 330.1 lb

## 2015-01-20 DIAGNOSIS — I5042 Chronic combined systolic (congestive) and diastolic (congestive) heart failure: Secondary | ICD-10-CM

## 2015-01-20 DIAGNOSIS — Y92008 Other place in unspecified non-institutional (private) residence as the place of occurrence of the external cause: Secondary | ICD-10-CM

## 2015-01-20 DIAGNOSIS — M79672 Pain in left foot: Secondary | ICD-10-CM

## 2015-01-20 DIAGNOSIS — F329 Major depressive disorder, single episode, unspecified: Secondary | ICD-10-CM

## 2015-01-20 DIAGNOSIS — W108XXA Fall (on) (from) other stairs and steps, initial encounter: Secondary | ICD-10-CM

## 2015-01-20 DIAGNOSIS — Z23 Encounter for immunization: Secondary | ICD-10-CM

## 2015-01-20 DIAGNOSIS — I10 Essential (primary) hypertension: Secondary | ICD-10-CM

## 2015-01-20 DIAGNOSIS — R600 Localized edema: Secondary | ICD-10-CM

## 2015-01-20 DIAGNOSIS — G4733 Obstructive sleep apnea (adult) (pediatric): Secondary | ICD-10-CM

## 2015-01-20 DIAGNOSIS — W19XXXA Unspecified fall, initial encounter: Secondary | ICD-10-CM | POA: Insufficient documentation

## 2015-01-20 DIAGNOSIS — Z0000 Encounter for general adult medical examination without abnormal findings: Secondary | ICD-10-CM

## 2015-01-20 DIAGNOSIS — Y939 Activity, unspecified: Secondary | ICD-10-CM

## 2015-01-20 DIAGNOSIS — Z79899 Other long term (current) drug therapy: Secondary | ICD-10-CM

## 2015-01-20 DIAGNOSIS — F32A Depression, unspecified: Secondary | ICD-10-CM

## 2015-01-20 DIAGNOSIS — E119 Type 2 diabetes mellitus without complications: Secondary | ICD-10-CM

## 2015-01-20 LAB — GLUCOSE, CAPILLARY: Glucose-Capillary: 97 mg/dL (ref 65–99)

## 2015-01-20 NOTE — Assessment & Plan Note (Signed)
Lab Results  Component Value Date   HGBA1C 5.5 12/28/2014   HGBA1C 5.4 07/27/2014   HGBA1C 5.4 08/25/2013    Assessment: Diabetes control: good control (HgbA1C at goal) Progress toward A1C goal:  at goal  Plan: Medications:  continue metformin Other plans:  Continue to work on weight loss. Refused eye exam today

## 2015-01-20 NOTE — Assessment & Plan Note (Signed)
Has appt with Dr. Elsworth Soho for July

## 2015-01-20 NOTE — Assessment & Plan Note (Signed)
Recently saw Dr. Aundra Dubin. Restarted lasix 31m bid and endorsed increase DOE and sob with some chest tightness when feeling sob while diuretics were held of missed a dose. Weight is up 2 pounds today from 5/27 but she says her breathing is improved now that she is back on lasix and she has also restarted her lisinopril. She is still holding spironolactone per HF instructions. She has a follow up visit next week for labs with HF clinic and will pick up her potassium supplementation today.   Advised to weigh herself daily, if her weight continues to trend up 1-2 pounds, she is advised to call HF clinic right away as she may need to restart her other diuretic. If she has recurrent sob or worsening DOE or any chest pain she is advised to let [place] HF team know right away or go to ED Restarted lisinopril, continue coreg and lasix.  Follow up with Dr. MAundra DubinWill meet with pulmonary in July for OSA

## 2015-01-20 NOTE — Assessment & Plan Note (Signed)
Fall on Monday from last step when going into garage. Fell face forward but did not hit head or face and hands broke her fall. She reports some left foot discomfort since fall. On exam, left ankle slightly more edematous than right with mild discomfort noted on medial and dorsal surface of left foot and mild tenderness to palpation in patch of erythema but no visible skin breakdown and possible ecchymosis. ROM of b/l feet intact.   Xray left foot to make sure no fracture

## 2015-01-20 NOTE — Progress Notes (Signed)
Medicine attending: Medical history, presenting problems, physical findings, and medications, reviewed with Dr Melburn Hake and I concur with her evaluation and management plan.

## 2015-01-20 NOTE — Assessment & Plan Note (Addendum)
BP Readings from Last 3 Encounters:  01/20/15 122/82  01/14/15 132/78  12/28/14 116/63   Lab Results  Component Value Date   NA 143 01/14/2015   K 2.9* 01/14/2015   CREATININE 1.10* 01/14/2015   Assessment: Blood pressure control: controlled Progress toward BP goal:  at goal  Plan: Medications:  continue lisinopril 94m and lasix 833mbid. holding spironolactone for now given rise in Cr Other plans: follow up HF team, labs ordered for next week, K was low recently and prescribed K supplementation by HF team that she will pick up today. If Cr back down, will likely be able to restart spironolactone Of note, she does endorse having an occasional dry cough that could be secondary to ACEi and will need to be monitored if persistent, may need to d/c ACEi

## 2015-01-20 NOTE — Progress Notes (Signed)
Subjective:   [name] female   DOB: [birth year] 64 y.o.   MRN: [id number]  HPI: [name] is a 64 y.o. female with PMH as listed below who presents to clinic today for depression follow up.   Depression--recently started on low dose trazodone in addition to prozac. She feels like she can tell the trazodone is helping with her mood but she does endorse some nausea and constipation lately since starting the medication. She is considering an increase in dose but given that she is having some GI side effects we discussed waiting a few more weeks to see if any improvement.   CHF--recently saw Dr. Aundra Dubin and had repeat lab work. Cr improved to 1.1 but K was down to 2.9. Dr. Aundra Dubin recommended restarting lisinopril and continuing lasix 12m bid and starting kdur 469m daily. She has restarted the lasix and lisinopril but will pick up potassium today. She endorses feeling sob and DOE when she stopped her diuretics last month due to rise in Cr, however, says that feeling is much improved now since restarting lasix. She does admit to missing a dose past few days as she was out and doesn't like to take it while going out due to frequent urination and noticed that she had some worsening sob and chest tightness at that time so she restarted the lasix bid and feels better since then.   Fall on Monday when going to garage on last step. Fell face forward but did not hit face instead broke her fall with her hands. Reports doing better now but still having some left foot pain. No obvious signs of fracture but does have some edema of foot and pain on medial surface of left foot with some erythema but no visible skin breakdown.   Past Medical History  Diagnosis Date  . Hypertension   . Diabetes mellitus   . Depression   . Nonischemic cardiomyopathy   . Morbid obesity   . Gout   . Bilateral knee pain   . Cellulitis of lower leg     left  . H/O: hysterectomy   . Unspecified  essential hypertension 03/22/2009    Qualifier: Diagnosis of  By: LaKarrie MeresN, BSN, [name]    . Acute respiratory failure with hypoxia 09/26/2012  . Chronic kidney disease 10/10/2012    Stage 2 with GFR of 64   . Malignant hypertension 09/26/2012  . Chronic combined systolic and diastolic congestive heart failure 10/10/2012    2 D echo 09/2012:  Left ventricle: Extremely poor acoustic windows limit study. Overall LVEF appears to be severely depressed. Recomm ordering limited study with contrast to further evaluate LV systolic function. The cavity size was normal. Wall thickness was normal. Doppler parameters are consistent with abnormal left ventricular relaxation (grade 1 diastolic dysfunction).   2 D echo 03/2009 :  Left ventricle: The cavity size was normal. There was mild concentric hypertrophy. Systolic function was mildly reduced. The estimated ejection fraction was in the range of 45% to 50%. Diffuse hypokinesis. Doppler parameters are consistent with abnormal left ventricular relaxation (grade 1 diastolic dysfunction).     . Hyperlipidemia   . Sleep apnea     on cpap   Current Outpatient Prescriptions  Medication Sig Dispense Refill  . allopurinol (ZYLOPRIM) 300 MG tablet Take 1 tablet (300 mg total) by mouth daily. 90 tablet 5  . ALOE PO Take 1 capsule by mouth 2 (two) times daily.    . Marland Kitchenspirin EC 81 MG EC  tablet Take 1 tablet (81 mg total) by mouth daily. 30 tablet 2  . carvedilol (COREG) 25 MG tablet Take 1 tablet (25 mg total) by mouth 2 (two) times daily with a meal. 60 tablet 1  . ferrous sulfate 325 (65 FE) MG tablet Take 1 tablet (325 mg total) by mouth 3 (three) times daily with meals. (Patient not taking: Reported on 12/28/2014) 90 tablet 0  . FLUoxetine (PROZAC) 40 MG capsule Take 2 capsules (80 mg total) by mouth daily. 60 capsule 5  . furosemide (LASIX) 80 MG tablet TAKE ONE TABLET BY MOUTH TWICE DAILY 60 tablet 0  . HYDROcodone-acetaminophen (NORCO/VICODIN) 5-325 MG per tablet Take 1  tablet by mouth every 6 (six) hours as needed for severe pain. (Patient not taking: Reported on 12/28/2014) 120 tablet 0  . lisinopril (PRINIVIL,ZESTRIL) 5 MG tablet Take 1 tablet (5 mg total) by mouth daily. 90 tablet 3  . lovastatin (MEVACOR) 20 MG tablet Take 1 tablet (20 mg total) by mouth at bedtime. 60 tablet 1  . metFORMIN (GLUCOPHAGE) 500 MG tablet Take 1 tablet (500 mg total) by mouth 2 (two) times daily with a meal. 60 tablet 5  . potassium chloride 20 MEQ TBCR Take 40 mEq by mouth daily. 60 tablet 0  . traZODone (DESYREL) 50 MG tablet Take 0.5 tablets (25 mg total) by mouth at bedtime. 30 tablet 0   No current facility-administered medications for this visit.   Family History  Problem Relation Age of Onset  . Asthma Mother   . Asthma Brother   . Asthma Brother   . Heart disease Mother   . Stroke Mother     clotting disorders   History   Social History  . Marital Status: Married    Spouse Name: N/A  . Number of Children: 2  . Years of Education: N/A   Occupational History  . retired cna    Social History Main Topics  . Smoking status: Never Smoker   . Smokeless tobacco: Not on file  . Alcohol Use: No  . Drug Use: No  . Sexual Activity: Not on file   Other Topics Concern  . Not on file   Social History Narrative   Review of Systems:  Constitutional:  Denies fever, chills  HEENT:  Denies congestion  Respiratory:  DOE, occasional dry cough  Cardiovascular:  Denies chest pain  Gastrointestinal:  Denies nausea, vomiting, abdominal pain, diarrhea. Constipation is improving  Genitourinary:  Denies dysuria  Musculoskeletal:  Left foot pain s/p fall   Skin:  Denies rash  Neurological:  Denies dizziness, headaches, syncope, light-headedness   Objective:  Physical Exam: Filed Vitals:   01/20/15 1458  BP: 122/82  Pulse: 66  Temp: 98.6 F (37 C)  TempSrc: Oral  Weight: 330 lb 1.6 oz (149.732 kg)  SpO2: 96%   Vitals reviewed. General: sitting in chair  wheelchair, NAD Cardiac: RRR, +2/6 SEM loudest RUSB Pulm: clear to auscultation bilaterally, no rales appreciated but limited auscultation in bases due to body habitus Abd: soft, obese, nontender, BS present Ext: warm and well perfused, +2DP B/L, -pitting edema, tenderness to palpation of b/l extremities, left ankle mildly edematous compared to right and noted small area of erythema on medial portion of left foot where she says she hurt it during the fall and mild tenderness to deep palpation of that area extending to dorsal surface of foot without visible skin breakdown. Dry skin on feet. ROM of b/l feet intact.  Neuro: alert and  oriented X3  Assessment & Plan:  Discussed with Dr. Beryle Beams

## 2015-01-20 NOTE — Assessment & Plan Note (Signed)
Reports some improvement in symptoms with trazodone 43m with prozac that she can tell. She would like to go up on the dose eventually, however is having occasional nausea and constipation since starting trazodone.   -will continue current dose of trazodone for at least another 2 weeks to see if further improvement and resolution of GI side effects, if remains uncontrolled and GI side effects improve on follow up could consider increasing trazodone with caution for possible serotonin syndrome and other side effects; advised to stop use immediately if any signs or symptoms of serotonin syndrome -continue prozac

## 2015-01-20 NOTE — Patient Instructions (Addendum)
General Instructions:  Dear Ms. Welte,  Please continue your lasix twice a day and weigh yourself daily. If your weight starts to go up 1-2 pounds, call the HF clinic right away or if you start having shortness of breath or chest pain call them or [place] right away or go to the emergency room  Please start your potassium right away per Dr. Aundra Dubin and continue your lisinopril for now  Continue trazodone at current dose and monitor for side-effects if still having constipation or nausea let me know  Return in approximately 1 month to meet your new doctor  Please bring your medicines with you each time you come to clinic.  Medicines may include prescription medications, over-the-counter medications, herbal remedies, eye drops, vitamins, or other pills.   Progress Toward Treatment Goals:  Treatment Goal 01/20/2015  Hemoglobin A1C at goal  Blood pressure at goal  Prevent falls -    Self Care Goals & Plans:  Self Care Goal 01/20/2015  Manage my medications take my medicines as prescribed; bring my medications to every visit; refill my medications on time  Monitor my health keep track of my blood glucose; bring my glucose meter and log to each visit; keep track of my blood pressure  Eat healthy foods eat more vegetables; eat foods that are low in salt; eat baked foods instead of fried foods  Be physically active find an activity I enjoy; take a walk every day    No flowsheet data found.   Care Management & Community Referrals:  Referral 06/02/2013  Referrals made to community resources weight management; exercise/physical therapy; nutrition

## 2015-01-20 NOTE — Assessment & Plan Note (Signed)
prevnar today Cannot afford zostavax or colonoscopy, no scholarship programs available that we know of She says she sent in her stool cards today Refused eye exam today Urine microalbumin check done today

## 2015-01-21 LAB — MICROALBUMIN / CREATININE URINE RATIO
Creatinine, Urine: 79.1 mg/dL
Microalb Creat Ratio: 3.8 mg/g (ref 0.0–30.0)
Microalb, Ur: 0.3 mg/dL (ref <2.0)

## 2015-01-25 ENCOUNTER — Other Ambulatory Visit (INDEPENDENT_AMBULATORY_CARE_PROVIDER_SITE_OTHER): Payer: Self-pay

## 2015-01-25 DIAGNOSIS — Z1211 Encounter for screening for malignant neoplasm of colon: Secondary | ICD-10-CM

## 2015-01-25 DIAGNOSIS — Z0000 Encounter for general adult medical examination without abnormal findings: Secondary | ICD-10-CM

## 2015-01-25 LAB — POC HEMOCCULT BLD/STL (HOME/3-CARD/SCREEN)
Card #2 Fecal Occult Blod, POC: NEGATIVE
Card #3 Fecal Occult Blood, POC: NEGATIVE
FECAL OCCULT BLD: NEGATIVE

## 2015-01-26 ENCOUNTER — Ambulatory Visit (HOSPITAL_COMMUNITY)
Admission: RE | Admit: 2015-01-26 | Discharge: 2015-01-26 | Disposition: A | Discharge status: Home / Self Care | Payer: Self-pay | Source: Ambulatory Visit | Attending: Internal Medicine | Admitting: Internal Medicine

## 2015-01-26 DIAGNOSIS — I5042 Chronic combined systolic (congestive) and diastolic (congestive) heart failure: Secondary | ICD-10-CM | POA: Insufficient documentation

## 2015-01-26 DIAGNOSIS — I5022 Chronic systolic (congestive) heart failure: Secondary | ICD-10-CM

## 2015-01-26 LAB — BASIC METABOLIC PANEL
ANION GAP: 11 (ref 5–15)
BUN: 26 mg/dL — ABNORMAL HIGH (ref 6–20)
CALCIUM: 9.3 mg/dL (ref 8.9–10.3)
CHLORIDE: 102 mmol/L (ref 101–111)
CO2: 30 mmol/L (ref 22–32)
Creatinine, Ser: 1.32 mg/dL — ABNORMAL HIGH (ref 0.44–1.00)
GFR, EST AFRICAN AMERICAN: 49 mL/min — AB (ref >60)
GFR, EST NON AFRICAN AMERICAN: 42 mL/min — AB (ref >60)
Glucose, Bld: 118 mg/dL — ABNORMAL HIGH (ref 65–99)
Potassium: 3.4 mmol/L — ABNORMAL LOW (ref 3.5–5.1)
Sodium: 143 mmol/L (ref 135–145)

## 2015-01-27 NOTE — Telephone Encounter (Signed)
Open in error

## 2015-01-27 NOTE — Telephone Encounter (Signed)
Left pt message about appt

## 2015-02-01 ENCOUNTER — Encounter: Payer: Self-pay | Admitting: Internal Medicine

## 2015-02-03 ENCOUNTER — Ambulatory Visit: Payer: Self-pay | Admitting: Nurse Practitioner

## 2015-02-17 ENCOUNTER — Other Ambulatory Visit: Payer: Self-pay | Admitting: Cardiology

## 2015-02-17 ENCOUNTER — Other Ambulatory Visit: Payer: Self-pay

## 2015-02-17 ENCOUNTER — Other Ambulatory Visit: Payer: Self-pay | Admitting: Internal Medicine

## 2015-02-17 MED ORDER — TRAZODONE HCL 50 MG PO TABS: 25.0000 mg | ORAL_TABLET | Freq: Every day | ORAL | Status: DC

## 2015-02-17 MED ORDER — FUROSEMIDE 80 MG PO TABS: 80.0000 mg | ORAL_TABLET | Freq: Two times a day (BID) | ORAL | Status: DC

## 2015-02-17 MED ORDER — POTASSIUM CHLORIDE ER 20 MEQ PO TBCR: 40.0000 meq | EXTENDED_RELEASE_TABLET | Freq: Every day | ORAL | Status: DC

## 2015-02-17 NOTE — Telephone Encounter (Signed)
Pt aware at pharmacy.

## 2015-02-22 ENCOUNTER — Ambulatory Visit (INDEPENDENT_AMBULATORY_CARE_PROVIDER_SITE_OTHER): Payer: Self-pay | Admitting: Internal Medicine

## 2015-02-22 ENCOUNTER — Encounter: Payer: Self-pay | Admitting: Internal Medicine

## 2015-02-22 VITALS — BP 137/82 | HR 57 | Temp 98.4°F | Ht 63.0 in | Wt 335.8 lb

## 2015-02-22 DIAGNOSIS — Z9119 Patient's noncompliance with other medical treatment and regimen: Secondary | ICD-10-CM

## 2015-02-22 DIAGNOSIS — E1122 Type 2 diabetes mellitus with diabetic chronic kidney disease: Secondary | ICD-10-CM

## 2015-02-22 DIAGNOSIS — Z6841 Body Mass Index (BMI) 40.0 and over, adult: Secondary | ICD-10-CM

## 2015-02-22 DIAGNOSIS — E119 Type 2 diabetes mellitus without complications: Secondary | ICD-10-CM

## 2015-02-22 DIAGNOSIS — F329 Major depressive disorder, single episode, unspecified: Secondary | ICD-10-CM

## 2015-02-22 DIAGNOSIS — R5383 Other fatigue: Secondary | ICD-10-CM

## 2015-02-22 DIAGNOSIS — N182 Chronic kidney disease, stage 2 (mild): Secondary | ICD-10-CM

## 2015-02-22 DIAGNOSIS — I5042 Chronic combined systolic (congestive) and diastolic (congestive) heart failure: Secondary | ICD-10-CM

## 2015-02-22 DIAGNOSIS — D649 Anemia, unspecified: Secondary | ICD-10-CM

## 2015-02-22 DIAGNOSIS — F32A Depression, unspecified: Secondary | ICD-10-CM

## 2015-02-22 DIAGNOSIS — N179 Acute kidney failure, unspecified: Secondary | ICD-10-CM

## 2015-02-22 DIAGNOSIS — I130 Hypertensive heart and chronic kidney disease with heart failure and stage 1 through stage 4 chronic kidney disease, or unspecified chronic kidney disease: Secondary | ICD-10-CM

## 2015-02-22 DIAGNOSIS — I10 Essential (primary) hypertension: Secondary | ICD-10-CM

## 2015-02-22 DIAGNOSIS — G4733 Obstructive sleep apnea (adult) (pediatric): Secondary | ICD-10-CM

## 2015-02-22 DIAGNOSIS — E785 Hyperlipidemia, unspecified: Secondary | ICD-10-CM

## 2015-02-22 MED ORDER — TRAZODONE HCL 50 MG PO TABS: 50.0000 mg | ORAL_TABLET | Freq: Every day | ORAL | Status: DC

## 2015-02-22 MED ORDER — LOVASTATIN 20 MG PO TABS: 20.0000 mg | ORAL_TABLET | Freq: Every day | ORAL | Status: DC

## 2015-02-22 NOTE — Patient Instructions (Signed)
-  Start Trazodone 50 mg at bedtime - Continue Lasix 80 mg twice daily - Continue K-Dur (potassium) 40 mEq (2 tablets) daily - Weigh yourself daily  - Lab work today - Follow up in 1 month to assess your depression symptoms  General Instructions:   Please bring your medicines with you each time you come to clinic.  Medicines may include prescription medications, over-the-counter medications, herbal remedies, eye drops, vitamins, or other pills.   Progress Toward Treatment Goals:  Treatment Goal 01/20/2015  Hemoglobin A1C at goal  Blood pressure at goal  Prevent falls -    Self Care Goals & Plans:  Self Care Goal 02/22/2015  Manage my medications take my medicines as prescribed; bring my medications to every visit; refill my medications on time; follow the sick day instructions if I am sick  Monitor my health keep track of my weight; check my feet daily  Eat healthy foods eat more vegetables; eat fruit for snacks and desserts; eat baked foods instead of fried foods; eat smaller portions; drink diet soda or water instead of juice or soda  Be physically active find an activity I enjoy    No flowsheet data found.   Care Management & Community Referrals:  Referral 06/02/2013  Referrals made to community resources weight management; exercise/physical therapy; nutrition

## 2015-02-23 LAB — BASIC METABOLIC PANEL WITH GFR
BUN: 23 mg/dL (ref 6–23)
CO2: 31 mEq/L (ref 19–32)
Calcium: 8.8 mg/dL (ref 8.4–10.5)
Chloride: 103 mEq/L (ref 96–112)
Creat: 1.08 mg/dL (ref 0.50–1.10)
GFR, EST AFRICAN AMERICAN: 63 mL/min
GFR, EST NON AFRICAN AMERICAN: 55 mL/min — AB
Glucose, Bld: 89 mg/dL (ref 70–99)
POTASSIUM: 4 meq/L (ref 3.5–5.3)
Sodium: 144 mEq/L (ref 135–145)

## 2015-02-23 LAB — LIPID PANEL
Cholesterol: 212 mg/dL — ABNORMAL HIGH (ref 0–200)
HDL: 60 mg/dL (ref >=46)
LDL Cholesterol: 132 mg/dL — ABNORMAL HIGH (ref 0–99)
Total CHOL/HDL Ratio: 3.5 Ratio
Triglycerides: 99 mg/dL (ref <150)
VLDL: 20 mg/dL (ref 0–40)

## 2015-02-24 ENCOUNTER — Telehealth: Payer: Self-pay | Admitting: Internal Medicine

## 2015-02-24 NOTE — Assessment & Plan Note (Signed)
Patient noted to have AKI on 12/28/14 on routine blood work. Etiology unclear. Repeat bmet on 01/14/15 showed improvement of Cr to 1.1 and then slight jump to 1.32 on 01/26/15 likely due to restarting lisinopril. Will get repeat bmet today to assess her Cr and K since she is on Lasix.  - repeat bmet>> K 4.0, Cr 1.08 which is near baseline 0.9

## 2015-02-24 NOTE — Assessment & Plan Note (Signed)
Patient reported she had 1 episode last week where she had "fluttering" in her chest that lasted 48 hours. She states she wanted to go to the emergency room but did not go. She denied calling Dr. Claris Gladden office as well. She reports she felt like she was "suffocating" and that she had "too much fluid" on board. She denies any dyspnea, chest pain, or palpitations currently. She admits to not weighing herself daily. She reports weighing herself about 3 times per week. When I asked her the last time she weighed herself she said "yesterday" but could not tell me the exact number. We discussed the importance of daily weights because of her heart failure. We also discussed limiting her fluid intake to 2 L. I emphasized to her the importance of contacting Dr. Claris Gladden office if her weight is 3-5 lbs over her baseline (326-329 lbs). Her weight was taken after I saw her during the visit and noted to be 335 lbs. I have left a message for the patient to contact Dr. Aundra Dubin so that her diuretics can be adjusted.  - Continue Coreg 25 mg BID - Continue Lisinopril 5 mg daily - Continue Lasix 80 mg BID for now, may need to be increased or Torsemide added - Continue K-Dur 40 mEq daily - Check bmet today>> K 4.0 - Instructed to perform daily weights at home

## 2015-02-24 NOTE — Telephone Encounter (Signed)
Tried calling patient and her emergency contact but no answer. Will ask office staff to keep trying to contact patient.

## 2015-02-24 NOTE — Assessment & Plan Note (Signed)
Patient admits to not using her CPAP. She states she finds the mask "annoying and uncomfortable." We discussed the importance of using CPAP and how untreated OSA can lead to pulmonary hypertension and worsening of her heart failure. We also discussed that this could be contributing to her fatigue in addition to her heart failure. Weight loss is key for this patient but will be difficult. She has an appointment with Dr. Elsworth Soho on 7/26. - Encouraged patient to use CPAP - Encouraged walking/eating healthy for weight loss - f/u Dr. Bari Mantis recommendations

## 2015-02-24 NOTE — Progress Notes (Signed)
   Subjective:    [name], female    DOB: 1950/10/14, 64 y.o.   MRN: [id number]  HPI Robin Hardin is a 64yo woman with PMHx of HTN, combined CHF, OSA, type 2 DM, CKD Stage 2, hyperlipidemia, morbid obesity, and depression who presents today for a routine visit.  Please see A&P documentation.    Review of Systems General: Denies fever, chills, night sweats, changes in appetite HEENT: Denies headaches, ear pain, changes in vision, rhinorrhea, sore throat CV: Denies CP, orthopnea Pulm: Denies cough, wheezing GI: Denies abdominal pain, nausea, vomiting, diarrhea, constipation, melena, hematochezia GU: Denies dysuria, hematuria, frequency Msk: Denies muscle cramps, joint pains Neuro: Denies weakness, numbness, tingling Skin: Denies rashes, bruising    Objective:   Physical Exam General: obese woman sitting up in chair, NAD HEENT: [place]/AT, EOMI, sclera anicteric, mucus membranes moist Neck: obese, difficult to appreciate JVD CV: RRR, 2/6 systolic murmur Pulm: CTA bilaterally, but decreased breath sounds in bases due to body habitus Abd: BS+, soft, obese, non-tender Ext: warm, 2+ pitting edema bilaterally Neuro: alert and oriented x 3    Assessment & Plan:  Please refer to A&P documentation.

## 2015-02-24 NOTE — Assessment & Plan Note (Signed)
BP Readings from Last 3 Encounters:  02/22/15 137/82  01/20/15 122/82  01/14/15 132/78    Lab Results  Component Value Date   NA 144 02/22/2015   K 4.0 02/22/2015   CREATININE 1.08 02/22/2015    Assessment: Blood pressure control: controlled Progress toward BP goal:  at goal Comments: BP well controlled.   Plan: Medications:  Continue Coreg 25 mg BID, Lasix 80 mg BID, and Lisinopril 5 mg daily.  Educational resources provided: brochure, handout, video Other plans:  - Weight is up to 335 lbs (she was weighed after I saw her during visit) with her baseline weight 326-329 lbs. I contacted patient by phone but did not reach her. I left a message stating that she needs to contact Dr. Claris Gladden office for further instructions in regards to her diuretics. She does not seem to be weighing herself daily. I emphasized the importance of this during our visit.

## 2015-02-24 NOTE — Assessment & Plan Note (Signed)
Patient recently started on Trazodone 25 mg QHS in addition to her Prozac 80 mg daily which she has been on for over 4 years. She states she has noticed a small improvement since starting Trazodone. She was having symptoms of constipation and nausea, but states these symptoms have resolved.  - Increase Trazodone to 50 mg QHS - Continue Prozac 80 mg daily

## 2015-02-24 NOTE — Assessment & Plan Note (Signed)
Hgb low-normal at 12.1 with MCV 90 on 12/28/14. She has not had a screening colonoscopy due to lack of health insurance. However, she did complete stool cards on 01/25/15 which were all negative.  - Continue to monitor - Repeat stool cards in 1 year

## 2015-02-24 NOTE — Assessment & Plan Note (Signed)
Currently on Lovastatin 20 mg QHS. Needs repeat lipid panel since last in 2014. - Lipid panel today>> Chol 212, Trigly 99, HDL 60, LDL 132. Would benefit from higher intensity statin but cost an issue due to lack of insurance. Will discuss increasing to 40 mg daily at next visit since her LDL is elevated. Technically her LDL should be less than 70 since she is labeled a diabetic but her HbA1c is well below the cut off on low dose Metformin. Will at least aim for LDL less than 100.  - Continue Lovastatin

## 2015-02-24 NOTE — Assessment & Plan Note (Signed)
Likely multifactorial due to morbid obesity, heart failure, and untreated OSA. I discussed all these factors with the patient and how we can modify some of these to help her fatigue. We discussed keeping track of her weight as she has heart failure and that weight loss is going to be key in helping many of her medical problems. She has lost a significant amount of weight in the past 2 years (394 lbs>> 335 lbs today). I congratulated her on this achievement and encouraged her to continue lifestyle modifications to continue to lose weight. We also discussed using her CPAP machine to treat her OSA as this is likely contributing to some of her fatigue as well.

## 2015-02-24 NOTE — Telephone Encounter (Signed)
Left message for patient that her weight is up (335 lbs, baseline should be 326-329 lbs per notes) and that she needs to contact Dr. Claris Gladden office for further instructions in regards to her diuretics. Will try contacting patient again.

## 2015-02-24 NOTE — Assessment & Plan Note (Signed)
Will get bmet today since on Lasix and with recent AKI.  - bmet>> K 4.0, Cr 1.08 close to baseline 0.9

## 2015-02-24 NOTE — Assessment & Plan Note (Signed)
Lab Results  Component Value Date   HGBA1C 5.5 12/28/2014   HGBA1C 5.4 07/27/2014   HGBA1C 5.4 08/25/2013     Assessment: Diabetes control: good control (HgbA1C at goal) Progress toward A1C goal:  at goal Comments: HbA1c consistently in 5.5 range. Likely patient does not need to be continued on Metformin. Will discuss stopping this medication at her next visit.   Plan: Medications:  Continue Metformin 500 mg BID. Consider stopping at next visit.  Instruction/counseling given: reminded to bring medications to each visit, discussed the need for weight loss and discussed diet Educational resources provided: brochure, handout

## 2015-02-28 NOTE — Progress Notes (Signed)
Internal Medicine Clinic Attending  Case discussed with Dr. Arcelia Jew at the time of the visit.  We reviewed the resident's history and exam and pertinent patient test results.  I agree with the assessment, diagnosis, and plan of care documented in the resident's note.

## 2015-03-03 ENCOUNTER — Ambulatory Visit: Payer: Self-pay

## 2015-03-15 ENCOUNTER — Institutional Professional Consult (permissible substitution): Payer: Self-pay | Admitting: Pulmonary Disease

## 2015-03-15 NOTE — Addendum Note (Signed)
Addended by: Hulan Fray on: 03/15/2015 06:16 PM   Modules accepted: Orders

## 2015-03-23 ENCOUNTER — Other Ambulatory Visit: Payer: Self-pay | Admitting: Cardiology

## 2015-03-23 ENCOUNTER — Other Ambulatory Visit: Payer: Self-pay | Admitting: Internal Medicine

## 2015-03-28 ENCOUNTER — Other Ambulatory Visit (HOSPITAL_COMMUNITY): Payer: Self-pay | Admitting: *Deleted

## 2015-03-28 ENCOUNTER — Encounter: Payer: Self-pay | Admitting: Internal Medicine

## 2015-03-28 ENCOUNTER — Other Ambulatory Visit: Payer: Self-pay | Admitting: *Deleted

## 2015-03-28 ENCOUNTER — Other Ambulatory Visit: Payer: Self-pay | Admitting: Internal Medicine

## 2015-03-28 ENCOUNTER — Other Ambulatory Visit (HOSPITAL_COMMUNITY): Payer: Self-pay | Admitting: Internal Medicine

## 2015-03-28 NOTE — Telephone Encounter (Signed)
Pt aware Rx was denied by Dr Lynnae January - has enough med for 5 days. Appt made with Dr Gordy Levan  03/30/15 9:15AM.

## 2015-03-28 NOTE — Telephone Encounter (Signed)
I reviewed Dr Rivet's last office note. I emphasized to her the importance of contacting Dr. Claris Gladden office if her weight is 3-5 lbs over her baseline (326-329 lbs). Her weight was taken after I saw her during the visit and noted to be 335 lbs. I have left a message for the patient to contact Dr. Aundra Dubin so that her diuretics can be adjusted.  I cannot refill the med. Pt stated she did not weigh herself at home therefore she needs appt to check weight. Pls ask her to sch appt this week (ASAP) [facility] or Dr Aundra Dubin. If she needs med to last until the appt, I will then refill.

## 2015-03-29 ENCOUNTER — Telehealth: Payer: Self-pay | Admitting: Internal Medicine

## 2015-03-29 NOTE — Telephone Encounter (Signed)
Call to patient to confirm appointment for 03/30/15 at 9:15 lmtcb

## 2015-03-30 ENCOUNTER — Ambulatory Visit: Payer: Self-pay | Admitting: Internal Medicine

## 2015-03-31 ENCOUNTER — Encounter: Payer: Self-pay | Admitting: Internal Medicine

## 2015-03-31 ENCOUNTER — Ambulatory Visit (INDEPENDENT_AMBULATORY_CARE_PROVIDER_SITE_OTHER): Payer: Self-pay | Admitting: Internal Medicine

## 2015-03-31 VITALS — BP 143/82 | HR 69 | Temp 98.2°F | Ht 63.0 in | Wt 327.7 lb

## 2015-03-31 DIAGNOSIS — M170 Bilateral primary osteoarthritis of knee: Secondary | ICD-10-CM

## 2015-03-31 DIAGNOSIS — G4733 Obstructive sleep apnea (adult) (pediatric): Secondary | ICD-10-CM

## 2015-03-31 DIAGNOSIS — I5042 Chronic combined systolic (congestive) and diastolic (congestive) heart failure: Secondary | ICD-10-CM

## 2015-03-31 DIAGNOSIS — E785 Hyperlipidemia, unspecified: Secondary | ICD-10-CM

## 2015-03-31 DIAGNOSIS — E119 Type 2 diabetes mellitus without complications: Secondary | ICD-10-CM

## 2015-03-31 LAB — GLUCOSE, CAPILLARY: GLUCOSE-CAPILLARY: 98 mg/dL (ref 65–99)

## 2015-03-31 LAB — POCT GLYCOSYLATED HEMOGLOBIN (HGB A1C): Hemoglobin A1C: 5.1

## 2015-03-31 MED ORDER — FUROSEMIDE 80 MG PO TABS: 80.0000 mg | ORAL_TABLET | Freq: Two times a day (BID) | ORAL | Status: DC

## 2015-03-31 MED ORDER — HYDROCODONE-ACETAMINOPHEN 5-325 MG PO TABS: 1.0000 | ORAL_TABLET | Freq: Four times a day (QID) | ORAL | Status: DC | PRN

## 2015-03-31 MED ORDER — DICLOFENAC SODIUM 1 % TD GEL: 2.0000 g | Freq: Four times a day (QID) | TRANSDERMAL | Status: DC | PRN

## 2015-03-31 NOTE — Progress Notes (Signed)
[name], female   DOB: 1951-04-20, 64 y.o.   MRN: [id number]     Subjective:   [name] female    DOB: 05/18/1951 64 y.o.    MRN: [id number] Health Maintenance Due: Health Maintenance Due  Topic Date Due  . Hepatitis C Screening  [birth year]  . COLONOSCOPY  06/03/2001  . ZOSTAVAX  06/04/2011  . OPHTHALMOLOGY EXAM  08/25/2014  . INFLUENZA VACCINE  03/21/2015    _________________________________________________  HPI: [name] is a 64 y.o. female here for a f/u visit for med refills.  Pt has a PMH outlined below.  Please see problem-based charting assessment and plan for further details of medical issues addressed at today's visit.  PMH: Past Medical History  Diagnosis Date  . Hypertension   . Diabetes mellitus   . Depression   . Nonischemic cardiomyopathy   . Morbid obesity   . Gout   . Bilateral knee pain   . Cellulitis of lower leg     left  . H/O: hysterectomy   . Unspecified essential hypertension 03/22/2009    Qualifier: Diagnosis of  By: Karrie Meres RN, BSN, [name]    . Acute respiratory failure with hypoxia 09/26/2012  . Chronic kidney disease 10/10/2012    Stage 2 with GFR of 64   . Malignant hypertension 09/26/2012  . Chronic combined systolic and diastolic congestive heart failure 10/10/2012    2 D echo 09/2012:  Left ventricle: Extremely poor acoustic windows limit study. Overall LVEF appears to be severely depressed. Recomm ordering limited study with contrast to further evaluate LV systolic function. The cavity size was normal. Wall thickness was normal. Doppler parameters are consistent with abnormal left ventricular relaxation (grade 1 diastolic dysfunction).   2 D echo 03/2009 :  Left ventricle: The cavity size was normal. There was mild concentric hypertrophy. Systolic function was mildly reduced. The estimated ejection fraction was in the range of 45% to 50%. Diffuse hypokinesis. Doppler parameters are consistent with  abnormal left ventricular relaxation (grade 1 diastolic dysfunction).     . Hyperlipidemia   . Sleep apnea     on cpap    Medications: Current Outpatient Prescriptions on File Prior to Visit  Medication Sig Dispense Refill  . allopurinol (ZYLOPRIM) 300 MG tablet Take 1 tablet (300 mg total) by mouth daily. 90 tablet 5  . ALOE PO Take 1 capsule by mouth 2 (two) times daily.    Marland Kitchen aspirin EC 81 MG EC tablet Take 1 tablet (81 mg total) by mouth daily. 30 tablet 2  . carvedilol (COREG) 25 MG tablet TAKE ONE TABLET BY MOUTH TWICE DAILY WITH A MEAL 60 tablet 5  . ferrous sulfate 325 (65 FE) MG tablet Take 1 tablet (325 mg total) by mouth 3 (three) times daily with meals. 90 tablet 0  . FLUoxetine (PROZAC) 40 MG capsule Take 2 capsules (80 mg total) by mouth daily. 60 capsule 5  . lisinopril (PRINIVIL,ZESTRIL) 5 MG tablet Take 1 tablet (5 mg total) by mouth daily. 90 tablet 3  . lovastatin (MEVACOR) 20 MG tablet Take 1 tablet (20 mg total) by mouth at bedtime. 30 tablet 3  . metFORMIN (GLUCOPHAGE) 500 MG tablet Take 1 tablet (500 mg total) by mouth 2 (two) times daily with a meal. 60 tablet 5  . Potassium Chloride ER 20 MEQ TBCR TAKE TWO TABLETS BY MOUTH ONCE DAILY. PLEASE ADVISE PATIENT TO CALL FOR AN APPOINTMENT [phone]. 60 tablet 0  . traZODone (DESYREL) 50  MG tablet Take 1 tablet (50 mg total) by mouth at bedtime. 30 tablet 1   No current facility-administered medications on file prior to visit.    Allergies: No Known Allergies  FH: Family History  Problem Relation Age of Onset  . Asthma Mother   . Asthma Brother   . Asthma Brother   . Heart disease Mother   . Stroke Mother     clotting disorders    SH: Social History   Social History  . Marital Status: Married    Spouse Name: N/A  . Number of Children: 2  . Years of Education: N/A   Occupational History  . retired cna    Social History Main Topics  . Smoking status: Never Smoker   . Smokeless tobacco: None  .  Alcohol Use: No  . Drug Use: No  . Sexual Activity: Not Asked   Other Topics Concern  . None   Social History Narrative    Review of Systems: Constitutional: Negative for fever, chills and weight loss.  Eyes: Negative for blurred vision.  Respiratory: Negative for cough and shortness of breath.  Cardiovascular: Negative for chest pain, palpitations and +leg swelling.  Gastrointestinal: Negative for nausea, vomiting, abdominal pain, diarrhea, constipation and blood in stool.  Genitourinary: Negative for dysuria, urgency and frequency.  Musculoskeletal: Negative for myalgias and +back pain.  Neurological: Negative for dizziness, weakness and headaches.     Objective:   Vital Signs: Filed Vitals:   03/31/15 1032  BP: 143/82  Pulse: 69  Temp: 98.2 F (36.8 C)  TempSrc: Oral  Height: _0  (1.6 m)  Weight: 327 lb 11.2 oz (148.644 kg)  SpO2: 96%      BP Readings from Last 3 Encounters:  03/31/15 143/82  02/22/15 137/82  01/20/15 122/82    Physical Exam: Constitutional: Vital signs reviewed.  Patient is in NAD and cooperative with exam.  Head: Normocephalic and atraumatic. Eyes: EOMI, conjunctivae nl, no scleral icterus.  Neck: Supple. Cardiovascular: RRR, no MRG. Pulmonary/Chest: normal effort, CTAB, no wheezes, rales, or rhonchi. Abdominal: Soft. NT/ND +BS. Neurological: A&O x3, cranial nerves II-XII are grossly intact, moving all extremities. Extremities: 2+DP b/l; trace pitting edema. Skin: Warm, dry and intact. No rash.   Assessment & Plan:   Assessment and plan was discussed and formulated with my attending.

## 2015-03-31 NOTE — Assessment & Plan Note (Addendum)
Pt last OV with CHF clinic was on 12/2014 and suggested she cont lasix 80m bid and to call HF clinic if weight >3lbs overnight or >/= 5 lbs in 1 week.  She was instructed to hold spironolactone and lisinopril d/t AKI.  AKI resolved and she was put back on 526mlisinopril.  She presents today for med refill.  Weight today stable at 327. -refilled lasix 8088mid -adivsed her to f/u with Dr. MclClaris Gladdenfice (sent email for them to schedule an appt)

## 2015-03-31 NOTE — Progress Notes (Signed)
Internal Medicine Clinic Attending  Case discussed with Dr. Gordy Levan soon after the resident saw the patient.  We reviewed the resident's history and exam and pertinent patient test results.  I agree with the assessment, diagnosis, and plan of care documented in the resident's note.

## 2015-03-31 NOTE — Assessment & Plan Note (Signed)
Apparently uses CPAP sporadically and was a no show to her appt with Dr. Elsworth Soho.   -encourage to reschedule with Dr. Elsworth Soho and consistent use of CPAP

## 2015-03-31 NOTE — Assessment & Plan Note (Signed)
-  retinal scan today

## 2015-03-31 NOTE — Patient Instructions (Addendum)
Thank you for your visit today.   Please return to the internal medicine clinic in 1-2 month(s) or sooner if needed to see Dr. Arcelia Jew.    I have made the following additions/changes to your medications: Continue all medications.  Please call Dr. Claris Gladden office.   Please follow up with heart failure clinic.    Please be sure to bring all of your medications with you to every visit; this includes herbal supplements, vitamins, eye drops, and any over-the-counter medications.   Should you have any questions regarding your medications and/or any new or worsening symptoms, please be sure to call the clinic at [phone].   If you believe that you are suffering from a life threatening condition or one that may result in the loss of limb or function, then you should call 911 and proceed to the nearest Emergency Department.   A healthy lifestyle and preventative care can promote health and wellness.   Maintain regular health, dental, and eye exams.  Eat a healthy diet. Foods like vegetables, fruits, whole grains, low-fat dairy products, and lean protein foods contain the nutrients you need without too many calories. Decrease your intake of foods high in solid fats, added sugars, and salt. Get information about a proper diet from your caregiver, if necessary.  Regular physical exercise is one of the most important things you can do for your health. Most adults should get at least 150 minutes of moderate-intensity exercise (any activity that increases your heart rate and causes you to sweat) each week. In addition, most adults need muscle-strengthening exercises on 2 or more days a week.   Maintain a healthy weight. The body mass index (BMI) is a screening tool to identify possible weight problems. It provides an estimate of body fat based on height and weight. Your caregiver can help determine your BMI, and can help you achieve or maintain a healthy weight. For adults 20 years and older:  A BMI  below 18.5 is considered underweight.  A BMI of 18.5 to 24.9 is normal.  A BMI of 25 to 29.9 is considered overweight.  A BMI of 30 and above is considered obese.

## 2015-03-31 NOTE — Assessment & Plan Note (Addendum)
Pt c/o bilateral knee pain with XR c/w OA.  She has lost some weight.   -could try voltaren gel but likely too expensive; capsaicin also recommended  -refill vicodin #120 with no refills -should discuss further refills with PCP and will need to sign a pain contract

## 2015-03-31 NOTE — Assessment & Plan Note (Addendum)
Lab Results  Component Value Date   LDLCALC 132* 02/22/2015   Last LDL: 132, Reports compliance but that dose was decreased from 21m to 121mdaily  -increase statin to 2032maily  -f/u with PCP

## 2015-04-21 ENCOUNTER — Ambulatory Visit (HOSPITAL_COMMUNITY)
Admission: RE | Admit: 2015-04-21 | Discharge: 2015-04-21 | Disposition: A | Discharge status: Home / Self Care | Payer: Self-pay | Source: Ambulatory Visit | Attending: Cardiology | Admitting: Cardiology

## 2015-04-21 ENCOUNTER — Encounter (HOSPITAL_COMMUNITY): Payer: Self-pay

## 2015-04-21 VITALS — BP 132/84 | HR 63 | Wt 322.5 lb

## 2015-04-21 DIAGNOSIS — I5042 Chronic combined systolic (congestive) and diastolic (congestive) heart failure: Secondary | ICD-10-CM

## 2015-04-21 DIAGNOSIS — E669 Obesity, unspecified: Secondary | ICD-10-CM | POA: Insufficient documentation

## 2015-04-21 DIAGNOSIS — G4733 Obstructive sleep apnea (adult) (pediatric): Secondary | ICD-10-CM

## 2015-04-21 DIAGNOSIS — I5022 Chronic systolic (congestive) heart failure: Secondary | ICD-10-CM | POA: Insufficient documentation

## 2015-04-21 MED ORDER — SPIRONOLACTONE 25 MG PO TABS: 25.0000 mg | ORAL_TABLET | Freq: Every day | ORAL | Status: DC

## 2015-04-21 NOTE — Patient Instructions (Signed)
Start Spironolactone 12.5 mg (1/2 tab) daily  Labs in 10 days  Your physician has requested that you have an echocardiogram. Echocardiography is a painless test that uses sound waves to create images of your heart. It provides your doctor with information about the size and shape of your heart and how well your heart's chambers and valves are working. This procedure takes approximately one hour. There are no restrictions for this procedure.  We will contact you in 4 months to schedule your next appointment.

## 2015-04-21 NOTE — Progress Notes (Signed)
[name], female   DOB: 28-Nov-1950, 64 y.o.   MRN: [id number] Primary Care: Dr Arcelia Jew Primary Cardiologist: Dr. Aundra Dubin  HPI:  Robin Hardin is a 64 y.o. AAF with history of morbid obesity, HTN, and diastolic HF diagnosed in 6314.  She was eventually diagnosed with systolic heart failure by v-gram (09/2012) with an EF approximately 25%.  LHC showed no significant coronary disease.  She also has OSA with CPAP.   She had stopped taking her fluid pills in Feb 2014 due to cramping and ended up being admitted with respiratory failure.  She was diuresed ~40 pounds.  Her torsemide was changed to lasix to avoid cramping.  Echo showed LVEF 25%. Discharge weight 387 lbs. Echo in 12/14 showed EF 40-45% with mild LV dilation.   She returns for follow up. She is limited by knee and back pain.  She uses a wheelchair when she leaves the house.  She walks very little.  No dyspnea walking short distances around house, dyspnea walking up steps.  No chest pain.  No orthopnea/PND.  Weight is down 6 lbs . Creatinine has returned to normal range.    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 She was hospitalized at [facility] in [place] where she had a cath showing normal coronaries but EF 30-35% on LV-gram. She has been hospitalized two other times since then in [place] for CHF. Echo (8/10) showed EF 45-50%, mild global HK, mild LVH, grade I diastolic dysfunction, no significant valvular problems.  Echo (2/14) with EF severely decreased (poor windows so not quantified, appears about 25%).  Echo (12/14) with EF 40-45%, mild LV dilation, moderate LVH, moderate LAE.  2. HTN  3.  Depression  4. Morbid obesity  5. Hyperlipidemia  6. OSA: sporadic with CPAP  7. Gout on allopurinol  8. Bilateral knee pain, probable osteoarthritis  9. Diabetes type II  10. Left lower leg cellulitis in 10/11 with normal [place] for DVT.   Family History:  No FH of premature CAD  asthma: mother, 2 brothers  heart disease: mother  clotting disorders: mother (stroke)   Social History:  Recently from [place] to [place].  Married with 2 adopted children.  Never smoked, no ETOH or drugs.  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   Review of Systems  All systems reviewed and negative except as per HPI.    Current Outpatient Prescriptions  Medication Sig Dispense Refill  . allopurinol (ZYLOPRIM) 300 MG tablet Take 1 tablet (300 mg total) by mouth daily. 90 tablet 5  . ALOE PO Take 1 capsule by mouth 2 (two) times daily.    Marland Kitchen aspirin EC 81 MG EC tablet Take 1 tablet (81 mg total) by mouth daily. 30 tablet 2  . carvedilol (COREG) 25 MG tablet TAKE ONE TABLET BY MOUTH TWICE DAILY WITH A MEAL 60 tablet 5  . diclofenac sodium (VOLTAREN) 1 % GEL Apply 2 g topically 4 (four) times daily as needed. to knees. 1 Tube 0  . ferrous sulfate 325 (65 FE) MG tablet Take 1 tablet (325 mg total) by mouth 3 (three) times daily with meals.  90 tablet 0  . FLUoxetine (PROZAC) 40 MG capsule Take 2 capsules (80 mg total) by mouth daily. 60 capsule 5  . furosemide (LASIX) 80 MG tablet Take 1 tablet (80 mg total) by mouth 2 (two) times daily. 60 tablet 1  . HYDROcodone-acetaminophen (NORCO/VICODIN) 5-325 MG per tablet Take 1 tablet by mouth every 6 (six) hours as needed for severe pain. 120 tablet 0  . lisinopril (PRINIVIL,ZESTRIL) 5 MG tablet Take 1 tablet (5 mg total) by mouth daily. 90 tablet 3  . lovastatin (MEVACOR) 20 MG tablet Take 1 tablet (20 mg total) by mouth at bedtime. 30 tablet 3  . metFORMIN (GLUCOPHAGE) 500 MG tablet Take 1 tablet (500 mg total) by mouth 2 (two) times daily with a  meal. 60 tablet 5  . Potassium Chloride ER 20 MEQ TBCR TAKE TWO TABLETS BY MOUTH ONCE DAILY. PLEASE ADVISE PATIENT TO CALL FOR AN APPOINTMENT [phone]. 60 tablet 0  . traZODone (DESYREL) 50 MG tablet Take 1 tablet (50 mg total) by mouth at bedtime. 30 tablet 1  . spironolactone (ALDACTONE) 25 MG tablet Take 1 tablet (25 mg total) by mouth daily. 90 tablet 3   No current facility-administered medications for this encounter.    No Known Allergies   Filed Vitals:   04/21/15 0854  BP: 132/84  Pulse: 63  Weight: 322 lb 8 oz (146.285 kg)  SpO2: 96%    PHYSICAL EXAM: General:  Markedly obese. Sitting in [place]. No respiratory difficulty, daughter present HEENT: normal Neck: supple. Unable to clearly see JVP but appears flat; Carotids 2+ bilat; no bruits. No lymphadenopathy or thryomegaly appreciated. Cor: PMI nonpalpable. Regular rate & rhythm. 2/6 SEM RUSB. Lungs: clear Abdomen: markedly obese, soft, nontender, nondistended.  No bruits or masses. Good bowel sounds. Extremities: no cyanosis, clubbing, rash, edema. Neuro: alert & oriented x 3, cranial nerves grossly intact. moves all 4 extremities w/o difficulty. Affect pleasant.  ASSESSMENT & PLAN: 1. Chronic systolic CHF: Last echo 48/83 with EF 40-45%. Out of range for ICD. NYHA II symptoms but very limited by back and knee pain.   - Continue Lasix 80 mg bid.  Check BMET/BNP today.  - Continue current lisinopril and Coreg. - Add spironolactone 12.5 mg daily with BMET in 10 days.  - Needs repeat echocardiogram.  - Reinforced the need and importance of daily weights, a low sodium diet, and fluid restriction (less than 2 L a day). Instructed to call the HF clinic if weight increases more than 3 lbs overnight or 5 lbs in a week.  2. OSA: Not using CPAP, needs appointment with sleep medicine.  3. Obesity: Needs to continue to work on weight loss.   Followup in 4 months                                                                                             Loralie Champagne 04/21/2015

## 2015-04-23 ENCOUNTER — Other Ambulatory Visit: Payer: Self-pay | Admitting: Internal Medicine

## 2015-04-24 ENCOUNTER — Other Ambulatory Visit (HOSPITAL_COMMUNITY): Payer: Self-pay | Admitting: Internal Medicine

## 2015-04-26 ENCOUNTER — Other Ambulatory Visit: Payer: Self-pay | Admitting: Internal Medicine

## 2015-04-26 ENCOUNTER — Other Ambulatory Visit (HOSPITAL_COMMUNITY): Payer: Self-pay | Admitting: *Deleted

## 2015-04-26 MED ORDER — POTASSIUM CHLORIDE ER 20 MEQ PO TBCR: EXTENDED_RELEASE_TABLET | ORAL | Status: DC

## 2015-04-26 NOTE — Telephone Encounter (Signed)
Pt called requesting potassium chloride and fluoxetine to be filled @ [facility].

## 2015-04-27 MED ORDER — POTASSIUM CHLORIDE ER 20 MEQ PO TBCR
EXTENDED_RELEASE_TABLET | ORAL | Status: DC
Start: 2015-04-27 — End: 2015-08-02

## 2015-04-27 MED ORDER — FLUOXETINE HCL 40 MG PO CAPS: 80.0000 mg | ORAL_CAPSULE | Freq: Every day | ORAL | Status: DC

## 2015-04-29 ENCOUNTER — Ambulatory Visit (HOSPITAL_COMMUNITY)
Admission: RE | Admit: 2015-04-29 | Discharge: 2015-04-29 | Disposition: A | Discharge status: Home / Self Care | Payer: Self-pay | Source: Ambulatory Visit | Attending: Cardiology | Admitting: Cardiology

## 2015-04-29 DIAGNOSIS — I5022 Chronic systolic (congestive) heart failure: Secondary | ICD-10-CM

## 2015-04-29 DIAGNOSIS — I5042 Chronic combined systolic (congestive) and diastolic (congestive) heart failure: Secondary | ICD-10-CM | POA: Insufficient documentation

## 2015-04-29 LAB — BASIC METABOLIC PANEL
ANION GAP: 10 (ref 5–15)
BUN: 20 mg/dL (ref 6–20)
CO2: 29 mmol/L (ref 22–32)
Calcium: 9.3 mg/dL (ref 8.9–10.3)
Chloride: 104 mmol/L (ref 101–111)
Creatinine, Ser: 1.17 mg/dL — ABNORMAL HIGH (ref 0.44–1.00)
GFR calc Af Amer: 56 mL/min — ABNORMAL LOW (ref >60)
GFR calc non Af Amer: 49 mL/min — ABNORMAL LOW (ref >60)
Glucose, Bld: 102 mg/dL — ABNORMAL HIGH (ref 65–99)
POTASSIUM: 3.6 mmol/L (ref 3.5–5.1)
SODIUM: 143 mmol/L (ref 135–145)

## 2015-05-02 ENCOUNTER — Ambulatory Visit (HOSPITAL_COMMUNITY): Payer: Self-pay | Attending: Cardiovascular Disease

## 2015-05-02 ENCOUNTER — Other Ambulatory Visit: Payer: Self-pay

## 2015-05-02 DIAGNOSIS — Z6841 Body Mass Index (BMI) 40.0 and over, adult: Secondary | ICD-10-CM | POA: Insufficient documentation

## 2015-05-02 DIAGNOSIS — E785 Hyperlipidemia, unspecified: Secondary | ICD-10-CM | POA: Insufficient documentation

## 2015-05-02 DIAGNOSIS — I517 Cardiomegaly: Secondary | ICD-10-CM | POA: Insufficient documentation

## 2015-05-02 DIAGNOSIS — E119 Type 2 diabetes mellitus without complications: Secondary | ICD-10-CM | POA: Insufficient documentation

## 2015-05-02 DIAGNOSIS — I340 Nonrheumatic mitral (valve) insufficiency: Secondary | ICD-10-CM | POA: Insufficient documentation

## 2015-05-02 DIAGNOSIS — I5042 Chronic combined systolic (congestive) and diastolic (congestive) heart failure: Secondary | ICD-10-CM | POA: Insufficient documentation

## 2015-05-02 DIAGNOSIS — I351 Nonrheumatic aortic (valve) insufficiency: Secondary | ICD-10-CM | POA: Insufficient documentation

## 2015-05-03 ENCOUNTER — Encounter: Payer: Self-pay | Admitting: Internal Medicine

## 2015-05-04 ENCOUNTER — Other Ambulatory Visit (HOSPITAL_COMMUNITY): Payer: Self-pay | Admitting: *Deleted

## 2015-05-04 DIAGNOSIS — I5042 Chronic combined systolic (congestive) and diastolic (congestive) heart failure: Secondary | ICD-10-CM

## 2015-05-04 MED ORDER — SPIRONOLACTONE 25 MG PO TABS: 12.5000 mg | ORAL_TABLET | Freq: Every day | ORAL | Status: DC

## 2015-05-04 MED ORDER — FUROSEMIDE 80 MG PO TABS: 80.0000 mg | ORAL_TABLET | Freq: Two times a day (BID) | ORAL | Status: DC

## 2015-05-04 NOTE — Telephone Encounter (Signed)
Spoke with pt she said that she was only taking 40 mg bid of Lasix. She said her weight is up and down. After reading Aundra Dubin note it said to take Lasix 80 mg twice a day. Spironolactone 12.5 mg daily. She will start taking this correctly and continue to weigh her self and if her weight increases she will call us.  Pt also states that she has a question about her cholesterol medication. Dr Arcelia Jew is monitoring her lipids so I told her to call her. Refilled Lasix and spironolactone.

## 2015-05-20 ENCOUNTER — Other Ambulatory Visit: Payer: Self-pay | Admitting: Internal Medicine

## 2015-05-20 DIAGNOSIS — M170 Bilateral primary osteoarthritis of knee: Secondary | ICD-10-CM

## 2015-05-20 MED ORDER — METFORMIN HCL 500 MG PO TABS: 500.0000 mg | ORAL_TABLET | Freq: Two times a day (BID) | ORAL | Status: DC

## 2015-05-20 MED ORDER — HYDROCODONE-ACETAMINOPHEN 5-325 MG PO TABS: 1.0000 | ORAL_TABLET | Freq: Four times a day (QID) | ORAL | Status: DC | PRN

## 2015-05-20 MED ORDER — TRAZODONE HCL 50 MG PO TABS: 50.0000 mg | ORAL_TABLET | Freq: Every day | ORAL | Status: DC

## 2015-05-20 NOTE — Telephone Encounter (Signed)
Pt called requesting trazodone, metformin, furosemide, hydrocodone, lisinopril, and fluoxetine to be filled. Please call pt back.

## 2015-05-20 NOTE — Telephone Encounter (Signed)
Pt hais refills on lasix, lisinopril and Fluoxetine. Pt thinks she should be on Prozac 80 mg twice a day.  Please clarify. Last Vicodin refill 8/11

## 2015-05-20 NOTE — Telephone Encounter (Signed)
Pt informed Rx is ready and informed on dose of Prozac

## 2015-05-20 NOTE — Telephone Encounter (Signed)
Patient should be on prozac 80 mg daily (not BID).

## 2015-05-24 ENCOUNTER — Encounter: Payer: Self-pay | Admitting: Internal Medicine

## 2015-05-24 ENCOUNTER — Ambulatory Visit (INDEPENDENT_AMBULATORY_CARE_PROVIDER_SITE_OTHER): Payer: Self-pay | Admitting: Internal Medicine

## 2015-05-24 VITALS — BP 150/79 | HR 62 | Temp 98.5°F | Wt 316.1 lb

## 2015-05-24 DIAGNOSIS — F329 Major depressive disorder, single episode, unspecified: Secondary | ICD-10-CM

## 2015-05-24 DIAGNOSIS — E118 Type 2 diabetes mellitus with unspecified complications: Secondary | ICD-10-CM

## 2015-05-24 DIAGNOSIS — E785 Hyperlipidemia, unspecified: Secondary | ICD-10-CM

## 2015-05-24 DIAGNOSIS — F32A Depression, unspecified: Secondary | ICD-10-CM

## 2015-05-24 DIAGNOSIS — I5042 Chronic combined systolic (congestive) and diastolic (congestive) heart failure: Secondary | ICD-10-CM

## 2015-05-24 DIAGNOSIS — Z0000 Encounter for general adult medical examination without abnormal findings: Secondary | ICD-10-CM

## 2015-05-24 DIAGNOSIS — I10 Essential (primary) hypertension: Secondary | ICD-10-CM

## 2015-05-24 DIAGNOSIS — Z23 Encounter for immunization: Secondary | ICD-10-CM

## 2015-05-24 DIAGNOSIS — M170 Bilateral primary osteoarthritis of knee: Secondary | ICD-10-CM

## 2015-05-24 LAB — GLUCOSE, CAPILLARY: Glucose-Capillary: 93 mg/dL (ref 65–99)

## 2015-05-24 MED ORDER — HYDROCODONE-ACETAMINOPHEN 5-325 MG PO TABS: 1.0000 | ORAL_TABLET | Freq: Four times a day (QID) | ORAL | Status: DC | PRN

## 2015-05-24 MED ORDER — FLUOXETINE HCL 40 MG PO CAPS: 80.0000 mg | ORAL_CAPSULE | Freq: Every day | ORAL | Status: DC

## 2015-05-24 MED ORDER — TRAZODONE HCL 50 MG PO TABS: 50.0000 mg | ORAL_TABLET | Freq: Every day | ORAL | Status: DC

## 2015-05-24 MED ORDER — FUROSEMIDE 80 MG PO TABS
80.0000 mg | ORAL_TABLET | Freq: Two times a day (BID) | ORAL | Status: DC
Start: 2015-05-24 — End: 2016-01-13

## 2015-05-24 MED ORDER — METFORMIN HCL 500 MG PO TABS: 500.0000 mg | ORAL_TABLET | Freq: Two times a day (BID) | ORAL | Status: DC

## 2015-05-24 MED ORDER — LOVASTATIN 40 MG PO TABS: 40.0000 mg | ORAL_TABLET | Freq: Every day | ORAL | Status: DC

## 2015-05-24 NOTE — Progress Notes (Signed)
Copy of Pain contract given to pt.

## 2015-05-24 NOTE — Patient Instructions (Signed)
-  Will make sure you have refills on your medications - Pain contract signed today. Can get 3 prescriptions at a time now.  General Instructions:   Thank you for bringing your medicines today. This helps [place] keep you safe from mistakes.   Progress Toward Treatment Goals:  Treatment Goal 02/22/2015  Hemoglobin A1C at goal  Blood pressure at goal  Prevent falls -    Self Care Goals & Plans:  Self Care Goal 05/24/2015  Manage my medications take my medicines as prescribed; bring my medications to every visit; refill my medications on time  Monitor my health bring my glucose meter and log to each visit; keep track of my blood pressure; keep track of my blood glucose; check my feet daily  Eat healthy foods eat more vegetables; eat foods that are low in salt; eat baked foods instead of fried foods  Be physically active find an activity I enjoy    No flowsheet data found.   Care Management & Community Referrals:  Referral 06/02/2013  Referrals made to community resources weight management; exercise/physical therapy; nutrition

## 2015-05-26 LAB — PRESCRIPTION ABUSE MONITORING 17P, URINE
6-ACETYLMORPHINE, URINE: NEGATIVE ng/mL
Amphetamine Scrn, Ur: NEGATIVE ng/mL
BARBITURATE SCREEN URINE: NEGATIVE ng/mL
BENZODIAZEPINE SCREEN, URINE: NEGATIVE ng/mL
Buprenorphine, Urine: NEGATIVE ng/mL
CANNABINOIDS UR QL SCN: NEGATIVE ng/mL
CREATININE(CRT), U: 50.1 mg/dL (ref 20.0–300.0)
Carisoprodol/Meprobamate, Ur: NEGATIVE ng/mL
Cocaine (Metab) Scrn, Ur: NEGATIVE ng/mL
EDDP, URINE: NEGATIVE ng/mL
Fentanyl, Urine: NEGATIVE pg/mL
MDMA Screen, Urine: NEGATIVE ng/mL
MEPERIDINE SCREEN, URINE: NEGATIVE ng/mL
METHADONE SCREEN, URINE: NEGATIVE ng/mL
NITRITE URINE, QUANTITATIVE: NEGATIVE ug/mL
OXYCODONE+OXYMORPHONE UR QL SCN: NEGATIVE ng/mL
Opiate Scrn, Ur: NEGATIVE ng/mL
PHENCYCLIDINE QUANTITATIVE URINE: NEGATIVE ng/mL
PROPOXYPHENE SCREEN URINE: NEGATIVE ng/mL
Ph of Urine: 6.2 (ref 4.5–8.9)
SPECIFIC GRAVITY: 1.013
Tapentadol, Urine: NEGATIVE ng/mL
Tramadol Screen, Urine: NEGATIVE ng/mL

## 2015-05-28 NOTE — Assessment & Plan Note (Signed)
Refilled Lovastatin.

## 2015-05-28 NOTE — Assessment & Plan Note (Signed)
Mildly elevated today. Can consider increasing Lisinopril to 10 mg daily. Continue Coreg 25 mg BID, Lasix 80 mg BID, and Spironolactone 12.5 mg daily.

## 2015-05-28 NOTE — Assessment & Plan Note (Signed)
Discussed appropriate dose of Prozac with patient. Will continue Prozac 80 mg daily and Trazadone 50 mg QHS.

## 2015-05-28 NOTE — Assessment & Plan Note (Signed)
Patient has been receiving opioids at our clinic for at least the past 3 years and has never signed a pain contract or had a UDS. I reviewed the pain contract with the patient and had her sign one today in clinic. Also will obtain UDS today. I gave her a 3 month supply (3 Rx's) for Norco 5-325 mg Q6H PRN and decreased the number of tablets from #120 to #90 per month as she is only taking up to 3 pills a day. We discussed the importance and safety of taking Norco correctly.

## 2015-05-28 NOTE — Assessment & Plan Note (Signed)
Flu shot today

## 2015-05-28 NOTE — Assessment & Plan Note (Signed)
I am concerned the patient's chest pain may be ischemic in nature given the pain comes on during emotional stress and is relieved with rest. She has no record of an ischemic work up here. She reportedly had a cardiac cath when she lived in [place], [place] that showed normal coronary arteries but this was in 2003. I think she deserves to have another ischemic work up. I will send a note to her cardiologist, Dr. Aundra Dubin, so that he is aware of her symptoms and for him to weigh in on further work up.

## 2015-05-28 NOTE — Assessment & Plan Note (Signed)
I discussed cutting down on the patient's Metformin dose as her HbA1c is repeatedly in the 5.0 range, but patient does not wish to decrease her dose. She states she is on Metformin for weight loss and is concerned that she may not continue to lose weight if she stops this. I doubt that she was placed on Metformin solely for weight loss. Will continue Metformin 500 mg BID.

## 2015-05-28 NOTE — Progress Notes (Signed)
   Subjective:    [name], female    DOB: 1951/04/11, 64 y.o.   MRN: [id number]  HPI Robin Hardin is a 64yo woman with PMHx of HTN, combined systolic and diastolic HF, OSA, and type 2 DM who presents today for follow up of her chronic HTN, DM, and CHF.  HTN: BP mildly elevated today at 150/79. She takes Coreg 25 mg BID, Lasix 80 mg BID, Lisinopril 5 mg daily, and Spironolactone 12.5 mg daily. Patient reports her Lisinopril was cut down from 20 mg daily to 5 mg daily after she became sick and her BP was running lower.  Combined CHF: Follows with Dr. Aundra Dubin. Last appointment on 04/21/15. Patient notes she has occasionally been getting chest pain when she is under a lot of stress such as when her husband was placed in the hospital after a stroke. She denies chest pain with exertion, including walking or going up stairs. She describes the chest pain as left-sided and sometimes starting in her left shoulder and going down the arm or radiating to the left chest. She states this lasts for 5-10 minutes and then resolves with rest. She reports this happens "maybe 1-2 times per month." Last occurrence was 2 weeks ago. She also has associated SOB, but denies associated diaphoresis and nausea. She states she has had a heart attack in the past.   Type 2 DM: Last HbA1c 5.1 on 03/31/15. Patient reports she does not have a history of diabetes, but that she is on Metformin for weight loss.   Osteoarthritis of Bilateral Knees: Patient has chronic OA in her knees and has been on chronic opioid therapy for last 3 years. She currently takes Norco 5-325 mg Q6H PRN. She states most days she only takes Norco twice a day but if in a lot of pain will take three times daily.   Depression: Patient reports she thinks she is supposed to be taking Prozac 80 mg BID and has been doing so for the past 2-3 weeks. I explained to the patient the maximum dose that can be prescribed for Prozac is 80 mg daily. We reviewed  her medication list together and she agreed to start taking Prozac 80 mg once daily. She reports her depression is well controlled with Prozac and Trazadone 50 mg QHS.   Review of Systems General: Reports weight loss- patient making effort to eat healthier, has lost 11 lbs since last visit. Denies fever, chills, night sweats, changes in appetite HEENT: Denies headaches, ear pain, changes in vision, rhinorrhea, sore throat CV: Reports chest pain, SOB. Denies orthopnea Pulm: Denies cough, wheezing GI: Denies abdominal pain, nausea, vomiting, diarrhea, constipation, melena, hematochezia GU: Denies dysuria, hematuria, frequency Msk: Reports bilateral knee pain-chronic. Denies muscle cramps Neuro: Denies weakness, numbness, tingling Skin: Denies rashes, bruising Psych: Reports depression- well controlled on medication. Denies anxiety, hallucinations    Objective:   Physical Exam General: alert, sitting up in chair, NAD HEENT: [place]/AT, EOMI, sclera anicteric, mucus membranes moist CV: RRR, 2/6 systolic murmur appreciated Pulm: CTA bilaterally, breaths non-labored Abd: BS+, soft, obese, non-tender Ext: warm, 1+ pitting lower extremity edema bilaterally Neuro: alert and oriented x 3    Assessment & Plan:  Please refer to A&P documentation.

## 2015-05-30 NOTE — Progress Notes (Signed)
Internal Medicine Clinic Attending  Case discussed with Dr. Rivet soon after the resident saw the patient.  We reviewed the resident's history and exam and pertinent patient test results.  I agree with the assessment, diagnosis, and plan of care documented in the resident's note.  

## 2015-06-01 ENCOUNTER — Ambulatory Visit (HOSPITAL_COMMUNITY)
Admission: RE | Admit: 2015-06-01 | Discharge: 2015-06-01 | Disposition: A | Discharge status: Home / Self Care | Payer: Self-pay | Source: Ambulatory Visit | Attending: Cardiology | Admitting: Cardiology

## 2015-06-01 VITALS — BP 110/62 | HR 62 | Wt 312.4 lb

## 2015-06-01 DIAGNOSIS — F329 Major depressive disorder, single episode, unspecified: Secondary | ICD-10-CM | POA: Insufficient documentation

## 2015-06-01 DIAGNOSIS — I428 Other cardiomyopathies: Secondary | ICD-10-CM | POA: Insufficient documentation

## 2015-06-01 DIAGNOSIS — M109 Gout, unspecified: Secondary | ICD-10-CM | POA: Insufficient documentation

## 2015-06-01 DIAGNOSIS — M25562 Pain in left knee: Secondary | ICD-10-CM | POA: Insufficient documentation

## 2015-06-01 DIAGNOSIS — Z8249 Family history of ischemic heart disease and other diseases of the circulatory system: Secondary | ICD-10-CM | POA: Insufficient documentation

## 2015-06-01 DIAGNOSIS — E119 Type 2 diabetes mellitus without complications: Secondary | ICD-10-CM | POA: Insufficient documentation

## 2015-06-01 DIAGNOSIS — Z79899 Other long term (current) drug therapy: Secondary | ICD-10-CM | POA: Insufficient documentation

## 2015-06-01 DIAGNOSIS — Z7982 Long term (current) use of aspirin: Secondary | ICD-10-CM | POA: Insufficient documentation

## 2015-06-01 DIAGNOSIS — M25561 Pain in right knee: Secondary | ICD-10-CM | POA: Insufficient documentation

## 2015-06-01 DIAGNOSIS — Z7984 Long term (current) use of oral hypoglycemic drugs: Secondary | ICD-10-CM | POA: Insufficient documentation

## 2015-06-01 DIAGNOSIS — I5022 Chronic systolic (congestive) heart failure: Secondary | ICD-10-CM | POA: Insufficient documentation

## 2015-06-01 DIAGNOSIS — I10 Essential (primary) hypertension: Secondary | ICD-10-CM | POA: Insufficient documentation

## 2015-06-01 DIAGNOSIS — G4733 Obstructive sleep apnea (adult) (pediatric): Secondary | ICD-10-CM

## 2015-06-01 DIAGNOSIS — E785 Hyperlipidemia, unspecified: Secondary | ICD-10-CM | POA: Insufficient documentation

## 2015-06-01 DIAGNOSIS — I5042 Chronic combined systolic (congestive) and diastolic (congestive) heart failure: Secondary | ICD-10-CM

## 2015-06-01 MED ORDER — SACUBITRIL-VALSARTAN 24-26 MG PO TABS: 1.0000 | ORAL_TABLET | Freq: Two times a day (BID) | ORAL | Status: DC

## 2015-06-01 NOTE — Progress Notes (Signed)
[name], female   DOB: 1951-04-06, 64 y.o.   MRN: [id number] Primary Care: Dr Arcelia Jew Primary Cardiologist: Dr. Aundra Dubin  HPI:  Robin Hardin is a 64 y.o. AAF with history of morbid obesity, HTN, and diastolic HF diagnosed in 3664.  She was eventually diagnosed with systolic heart failure by v-gram (09/2012) with an EF approximately 25%.  LHC showed no significant coronary disease.  She also has OSA with CPAP.   She had stopped taking her fluid pills in Feb 2014 due to cramping and ended up being admitted with respiratory failure.  She was diuresed ~40 pounds.  Her torsemide was changed to lasix to avoid cramping.  Echo showed LVEF 25%. Discharge weight 387 lbs. Echo in 12/14 showed EF 40-45% with mild LV dilation.  Last echo in 9/16 showed EF 35-40% with grade II diastolic dysfunction.   She returns for follow up. She is limited by knee and back pain.  She uses a wheelchair when she leaves the house.  She walks very little.  No dyspnea walking short distances around house, dyspnea walking up steps or if she walks more than 5-10 minutes on flat ground.  No chest pain.  No orthopnea/PND.  Weight is down 10 lbs .    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 She was hospitalized at [facility] in [place] where she had a cath showing normal coronaries but EF 30-35% on LV-gram. She has been hospitalized two other times since then in [place] for CHF. Echo (8/10) showed EF 45-50%, mild global HK, mild LVH, grade I diastolic dysfunction, no significant valvular problems.  Echo (2/14) with EF severely decreased (poor windows so not  quantified, appears about 25%).  Echo (12/14) with EF 40-45%, mild LV dilation, moderate LVH, moderate LAE.  Echo (9/16) with EF 35-40%, grade II diastolic dysfunction, mild MR.  2. HTN  3. Depression  4. Morbid obesity  5. Hyperlipidemia  6. OSA: sporadic with CPAP  7. Gout on allopurinol  8. Bilateral knee pain, probable osteoarthritis  9. Diabetes type II  10. Left lower leg cellulitis in 10/11 with normal [place] for DVT.   Family History:  No FH of premature CAD  asthma: mother, 2 brothers  heart disease: mother  clotting disorders: mother (stroke)   Social History:  Recently from [place] to [place].  Married with 2 adopted children.  Never smoked, no ETOH or drugs.  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   Review of Systems  All systems reviewed and negative except as per HPI.   Current Outpatient Prescriptions  Medication Sig Dispense Refill  . allopurinol (ZYLOPRIM) 300 MG tablet Take 1 tablet (300 mg total) by mouth daily. 90 tablet 5  . ALOE PO Take 1 capsule by mouth 2 (two) times daily.    Marland Kitchen aspirin EC 81 MG EC tablet Take 1 tablet (81 mg total) by mouth daily. 30 tablet 2  . carvedilol (COREG) 25 MG tablet TAKE ONE TABLET BY MOUTH TWICE DAILY WITH A MEAL 60 tablet 5  . diclofenac sodium (VOLTAREN) 1 % GEL Apply 2 g  topically 4 (four) times daily as needed. to knees. 1 Tube 0  . ferrous sulfate 325 (65 FE) MG tablet Take 1 tablet (325 mg total) by mouth 3 (three) times daily with meals. 90 tablet 0  . FLUoxetine (PROZAC) 40 MG capsule Take 2 capsules (80 mg total) by mouth daily. 60 capsule 5  . furosemide (LASIX) 80 MG tablet Take 1 tablet (80 mg total) by mouth 2 (two) times daily. 60 tablet 5  . HYDROcodone-acetaminophen (NORCO/VICODIN) 5-325 MG tablet Take 1 tablet by mouth every 6 (six) hours as needed for severe pain. 90 tablet 0  . lovastatin (MEVACOR) 40 MG tablet Take 1 tablet (40 mg total) by mouth at bedtime. 30 tablet 5  . metFORMIN  (GLUCOPHAGE) 500 MG tablet Take 1 tablet (500 mg total) by mouth 2 (two) times daily with a meal. 60 tablet 1  . Potassium Chloride ER 20 MEQ TBCR TAKE TWO TABLETS BY MOUTH ONCE DAILY. PLEASE ADVISE PATIENT TO CALL FOR AN APPOINTMENT [phone]. 60 tablet 2  . spironolactone (ALDACTONE) 25 MG tablet Take 0.5 tablets (12.5 mg total) by mouth daily. 90 tablet 3  . traZODone (DESYREL) 50 MG tablet Take 1 tablet (50 mg total) by mouth at bedtime. 30 tablet 5  . sacubitril-valsartan (ENTRESTO) 24-26 MG Take 1 tablet by mouth 2 (two) times daily. 60 tablet 2   No current facility-administered medications for this encounter.    No Known Allergies   Filed Vitals:   06/01/15 0937  BP: 110/62  Pulse: 62  Weight: 312 lb 6.4 oz (141.704 kg)  SpO2: 94%    PHYSICAL EXAM: General:  Obese. Sitting in [place]. No respiratory difficulty, daughter present HEENT: normal Neck: supple. Unable to clearly see JVP but appears flat; Carotids 2+ bilat; no bruits. No lymphadenopathy or thryomegaly appreciated. Cor: PMI nonpalpable. Regular rate & rhythm. 2/6 SEM RUSB. Lungs: clear Abdomen: markedly obese, soft, nontender, nondistended.  No bruits or masses. Good bowel sounds. Extremities: no cyanosis, clubbing, rash, edema. Neuro: alert & oriented x 3, cranial nerves grossly intact. moves all 4 extremities w/o difficulty. Affect pleasant.  ASSESSMENT & PLAN: 1. Chronic systolic CHF: Nonischemic cardiomyopathy.  Last echo 9/16 with EF 35-40%. Out of range for ICD. NYHA III symptoms but very limited by back and knee pain so hard to tell how limited she truly is from cardiac perspective. .   - Continue Lasix 80 mg bid.   - Continue current spironolactone and Coreg. - Stop lisinopril, 36 hours later start Entresto 24/26 bid.  BMET in 10 days.  - Reinforced the need and importance of daily weights, a low sodium diet, and fluid restriction (less than 2 L a day). Instructed to call the HF clinic if weight increases more  than 3 lbs overnight or 5 lbs in a week.  2. OSA: Not using CPAP, needs appointment with sleep medicine.  3. Obesity: Needs to continue to work on weight loss (weight is going down).    Followup in 3 months  Loralie Champagne 06/01/2015

## 2015-06-01 NOTE — Patient Instructions (Addendum)
Stop Lisinopril   Saturday start Entresto 24/26 one tablet twice a day  Labs in 10 days  Follow up 3 months

## 2015-06-03 ENCOUNTER — Telehealth (HOSPITAL_COMMUNITY): Payer: Self-pay

## 2015-06-03 NOTE — Telephone Encounter (Signed)
Brianna from [facility] called.  She said the patient has been referred to the patient assist program  [phone]   Opt 2, Opt 5

## 2015-06-10 ENCOUNTER — Ambulatory Visit (HOSPITAL_COMMUNITY)
Admission: RE | Admit: 2015-06-10 | Discharge: 2015-06-10 | Disposition: A | Discharge status: Home / Self Care | Payer: Self-pay | Source: Ambulatory Visit | Attending: Internal Medicine | Admitting: Internal Medicine

## 2015-06-10 DIAGNOSIS — I5042 Chronic combined systolic (congestive) and diastolic (congestive) heart failure: Secondary | ICD-10-CM | POA: Insufficient documentation

## 2015-06-10 DIAGNOSIS — I5022 Chronic systolic (congestive) heart failure: Secondary | ICD-10-CM

## 2015-06-10 LAB — BASIC METABOLIC PANEL
ANION GAP: 11 (ref 5–15)
BUN: 37 mg/dL — ABNORMAL HIGH (ref 6–20)
CALCIUM: 9.6 mg/dL (ref 8.9–10.3)
CO2: 26 mmol/L (ref 22–32)
CREATININE: 2.19 mg/dL — AB (ref 0.44–1.00)
Chloride: 105 mmol/L (ref 101–111)
GFR, EST AFRICAN AMERICAN: 26 mL/min — AB (ref >60)
GFR, EST NON AFRICAN AMERICAN: 23 mL/min — AB (ref >60)
GLUCOSE: 129 mg/dL — AB (ref 65–99)
Potassium: 3.6 mmol/L (ref 3.5–5.1)
Sodium: 142 mmol/L (ref 135–145)

## 2015-06-15 ENCOUNTER — Ambulatory Visit (HOSPITAL_COMMUNITY)
Admission: RE | Admit: 2015-06-15 | Discharge: 2015-06-15 | Disposition: A | Discharge status: Home / Self Care | Payer: Self-pay | Source: Ambulatory Visit | Attending: Cardiology | Admitting: Cardiology

## 2015-06-15 DIAGNOSIS — I5022 Chronic systolic (congestive) heart failure: Secondary | ICD-10-CM

## 2015-06-15 DIAGNOSIS — I5042 Chronic combined systolic (congestive) and diastolic (congestive) heart failure: Secondary | ICD-10-CM | POA: Insufficient documentation

## 2015-06-15 LAB — BASIC METABOLIC PANEL
ANION GAP: 9 (ref 5–15)
BUN: 22 mg/dL — AB (ref 6–20)
CHLORIDE: 111 mmol/L (ref 101–111)
CO2: 24 mmol/L (ref 22–32)
Calcium: 9.1 mg/dL (ref 8.9–10.3)
Creatinine, Ser: 1.34 mg/dL — ABNORMAL HIGH (ref 0.44–1.00)
GFR calc Af Amer: 47 mL/min — ABNORMAL LOW (ref >60)
GFR calc non Af Amer: 41 mL/min — ABNORMAL LOW (ref >60)
Glucose, Bld: 110 mg/dL — ABNORMAL HIGH (ref 65–99)
POTASSIUM: 4 mmol/L (ref 3.5–5.1)
SODIUM: 144 mmol/L (ref 135–145)

## 2015-06-20 ENCOUNTER — Telehealth: Payer: Self-pay | Admitting: Cardiology

## 2015-06-20 DIAGNOSIS — I5042 Chronic combined systolic (congestive) and diastolic (congestive) heart failure: Secondary | ICD-10-CM

## 2015-06-20 NOTE — Telephone Encounter (Signed)
Pt c/o swelling: STAT is pt has developed SOB within 24 hours  1. How long have you been experiencing swelling? 1 week  2. Where is the swelling located? Hands, legs, ankles, feet, and abdomen  3.  Are you currently taking a "fluid pill"? Pt states she was recently taken off of her fluid pill  4.  Are you currently SOB? Not currently  5.  Have you traveled recently? No

## 2015-06-20 NOTE — Telephone Encounter (Signed)
HF patient,will forward

## 2015-06-21 NOTE — Telephone Encounter (Signed)
Go back to Lasix 80 mg po bid and repeat BMET in 1 week.  Very important that she comes for labs given recent AKI. Needs office followup.

## 2015-06-21 NOTE — Telephone Encounter (Signed)
Messages was routed to me at 4:22 10/31, however I had left for the day at 4:15.  Spoke w/pt this am and reminded her importance of calling correct office.  She states her wt is usually around 308 lb but it has been gradually increasing and she is 320 lb today.  She reports this all started on 10/21 when she was instructed to hold her Lasix for 2 days and then decrease her dose to 80 mg daily instead of BID due to her lab work.  She had repeat labs last week with improved CR.  She reports her abd is distended and her legs are swollen up to her thighs.  She is SOB with minimal activity.  She has still been taking Lasix 80 mg daily as instructed.  Will discuss w/Dr Aundra Dubin and call her back

## 2015-06-21 NOTE — Telephone Encounter (Signed)
Pt aware, agreeable and verbalizes understanding.  She will increase med and continue to monitor weight and symptoms, if not improving by the end of the week she will call us back.  Transportation is an issue for her so we will combine lab and f/u appt for Thur 11/10

## 2015-06-30 ENCOUNTER — Ambulatory Visit (HOSPITAL_COMMUNITY)
Admission: RE | Admit: 2015-06-30 | Discharge: 2015-06-30 | Disposition: A | Discharge status: Home / Self Care | Payer: Self-pay | Source: Ambulatory Visit | Attending: Cardiology | Admitting: Cardiology

## 2015-06-30 ENCOUNTER — Encounter (HOSPITAL_COMMUNITY): Payer: Self-pay

## 2015-06-30 VITALS — BP 106/74 | HR 65 | Wt 313.4 lb

## 2015-06-30 DIAGNOSIS — Z823 Family history of stroke: Secondary | ICD-10-CM | POA: Insufficient documentation

## 2015-06-30 DIAGNOSIS — M109 Gout, unspecified: Secondary | ICD-10-CM | POA: Insufficient documentation

## 2015-06-30 DIAGNOSIS — I130 Hypertensive heart and chronic kidney disease with heart failure and stage 1 through stage 4 chronic kidney disease, or unspecified chronic kidney disease: Secondary | ICD-10-CM | POA: Insufficient documentation

## 2015-06-30 DIAGNOSIS — Z6841 Body Mass Index (BMI) 40.0 and over, adult: Secondary | ICD-10-CM

## 2015-06-30 DIAGNOSIS — I5022 Chronic systolic (congestive) heart failure: Secondary | ICD-10-CM | POA: Insufficient documentation

## 2015-06-30 DIAGNOSIS — Z7984 Long term (current) use of oral hypoglycemic drugs: Secondary | ICD-10-CM | POA: Insufficient documentation

## 2015-06-30 DIAGNOSIS — E669 Obesity, unspecified: Secondary | ICD-10-CM | POA: Insufficient documentation

## 2015-06-30 DIAGNOSIS — E785 Hyperlipidemia, unspecified: Secondary | ICD-10-CM | POA: Insufficient documentation

## 2015-06-30 DIAGNOSIS — G4733 Obstructive sleep apnea (adult) (pediatric): Secondary | ICD-10-CM | POA: Insufficient documentation

## 2015-06-30 DIAGNOSIS — Z7982 Long term (current) use of aspirin: Secondary | ICD-10-CM | POA: Insufficient documentation

## 2015-06-30 DIAGNOSIS — Z8249 Family history of ischemic heart disease and other diseases of the circulatory system: Secondary | ICD-10-CM | POA: Insufficient documentation

## 2015-06-30 DIAGNOSIS — Z825 Family history of asthma and other chronic lower respiratory diseases: Secondary | ICD-10-CM | POA: Insufficient documentation

## 2015-06-30 DIAGNOSIS — N189 Chronic kidney disease, unspecified: Secondary | ICD-10-CM | POA: Insufficient documentation

## 2015-06-30 DIAGNOSIS — Z79899 Other long term (current) drug therapy: Secondary | ICD-10-CM | POA: Insufficient documentation

## 2015-06-30 DIAGNOSIS — E1122 Type 2 diabetes mellitus with diabetic chronic kidney disease: Secondary | ICD-10-CM | POA: Insufficient documentation

## 2015-06-30 DIAGNOSIS — I428 Other cardiomyopathies: Secondary | ICD-10-CM | POA: Insufficient documentation

## 2015-06-30 DIAGNOSIS — I5042 Chronic combined systolic (congestive) and diastolic (congestive) heart failure: Secondary | ICD-10-CM

## 2015-06-30 LAB — BASIC METABOLIC PANEL
ANION GAP: 8 (ref 5–15)
BUN: 19 mg/dL (ref 6–20)
CHLORIDE: 107 mmol/L (ref 101–111)
CO2: 28 mmol/L (ref 22–32)
Calcium: 8.9 mg/dL (ref 8.9–10.3)
Creatinine, Ser: 1 mg/dL (ref 0.44–1.00)
GFR calc Af Amer: >60 mL/min (ref >60)
GFR, EST NON AFRICAN AMERICAN: 58 mL/min — AB (ref >60)
GLUCOSE: 92 mg/dL (ref 65–99)
POTASSIUM: 4 mmol/L (ref 3.5–5.1)
Sodium: 143 mmol/L (ref 135–145)

## 2015-06-30 LAB — BRAIN NATRIURETIC PEPTIDE: B Natriuretic Peptide: 114.4 pg/mL — ABNORMAL HIGH (ref 0.0–100.0)

## 2015-06-30 NOTE — Progress Notes (Signed)
[name], female   DOB: Oct 23, 1950, 64 y.o.   MRN: [id number] Primary Care: Dr Arcelia Jew Primary Cardiologist: Dr. Aundra Dubin  HPI:  Robin Hardin is a 64 y.o. AAF with history of morbid obesity, HTN, and diastolic HF diagnosed in 3546.  She was eventually diagnosed with systolic heart failure by v-gram (09/2012) with an EF approximately 25%.  LHC showed no significant coronary disease.  She also has OSA with CPAP.   She had stopped taking her fluid pills in Feb 2014 due to cramping and ended up being admitted with respiratory failure.  She was diuresed ~40 pounds.  Her torsemide was changed to lasix to avoid cramping.  Echo showed LVEF 25%. Discharge weight 387 lbs. Echo in 12/14 showed EF 40-45% with mild LV dilation.  Last echo in 9/16 showed EF 35-40% with grade II diastolic dysfunction.   BMET done last appointment showed creatinine up to 2.9.  Lasix was held for a couple days then decreased to 80 mg once daily.  Her weight went up > 10 lbs and she developed dyspnea.  She restarted Lasix 80 mg bid on 11/1 and weight has come back down.  Her weight in the office today is the same as at last appointment here.  Breathing is stable.  She does not walk much but denies dyspnea around her house.  She fatigues easily.  She is mainly limited by back and knee pain.      Labs (7/14): K 3.9 Creatinine 0.91, LDL 105 Labs (06/05/13): K 3.7 Creatinine 0.87 Pro BNP 56  Labs (1/15): K 4, creatinine 0.9 Labs (07/27/14): K 4.2 Creatinine 0.92 Hgb 11.8 HIV NR Labs (5/16): K 5, creatinine 2.57, TSH normal, HCT 36.5 Labs (7/16): K 4, creatinine 1.08, LDL 132 Labs (9/16): K 3.6, creatinine 1.17 Labs (10/16): creaitnine 2.19 => 1.34  ECG: NSR, nonspecific T wave flattening  Past Medical History:  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 She was hospitalized at [facility] in [place] where she had a cath showing normal coronaries but EF 30-35% on LV-gram. She has been  hospitalized two other times since then in [place] for CHF. Echo (8/10) showed EF 45-50%, mild global HK, mild LVH, grade I diastolic dysfunction, no significant valvular problems.  Echo (2/14) with EF severely decreased (poor windows so not quantified, appears about 25%).  Echo (12/14) with EF 40-45%, mild LV dilation, moderate LVH, moderate LAE.  Echo (9/16) with EF 35-40%, grade II diastolic dysfunction, mild MR.  2. HTN  3. Depression  4. Morbid obesity  5. Hyperlipidemia  6. OSA: sporadic with CPAP  7. Gout on allopurinol  8. Bilateral knee pain, probable osteoarthritis  9. Diabetes type II  10. Left lower leg cellulitis in 10/11 with normal [place] for DVT 11. CKD.   Family History:  No FH of premature CAD  asthma: mother, 2 brothers  heart disease: mother  clotting disorders: mother (stroke)   Social History:  Recently from [place] to [place].  Married with 2 adopted children.  Never smoked, no ETOH or drugs.  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   Review of Systems  All systems reviewed and negative except as per HPI.   Current Outpatient Prescriptions  Medication Sig Dispense Refill  . allopurinol (ZYLOPRIM) 300 MG tablet Take 1 tablet (300 mg total) by mouth daily. 90 tablet 5  . ALOE PO Take 1 capsule by mouth 2 (two) times daily.    Marland Kitchen aspirin  EC 81 MG EC tablet Take 1 tablet (81 mg total) by mouth daily. 30 tablet 2  . carvedilol (COREG) 25 MG tablet TAKE ONE TABLET BY MOUTH TWICE DAILY WITH A MEAL 60 tablet 5  . diclofenac sodium (VOLTAREN) 1 % GEL Apply 2 g topically 4 (four) times daily as needed. to knees. 1 Tube 0  . ferrous sulfate 325 (65 FE) MG tablet Take 1 tablet (325 mg total) by mouth 3 (three) times daily with meals. 90 tablet 0  . FLUoxetine (PROZAC) 40 MG capsule Take 2 capsules (80 mg total) by mouth daily. 60 capsule 5  . furosemide (LASIX) 80 MG tablet Take 1 tablet (80 mg total) by mouth 2 (two) times daily. 60 tablet 5  .  HYDROcodone-acetaminophen (NORCO/VICODIN) 5-325 MG tablet Take 1 tablet by mouth every 6 (six) hours as needed for severe pain. 90 tablet 0  . lovastatin (MEVACOR) 40 MG tablet Take 1 tablet (40 mg total) by mouth at bedtime. 30 tablet 5  . metFORMIN (GLUCOPHAGE) 500 MG tablet Take 1 tablet (500 mg total) by mouth 2 (two) times daily with a meal. 60 tablet 1  . Potassium Chloride ER 20 MEQ TBCR TAKE TWO TABLETS BY MOUTH ONCE DAILY. PLEASE ADVISE PATIENT TO CALL FOR AN APPOINTMENT [phone]. 60 tablet 2  . sacubitril-valsartan (ENTRESTO) 24-26 MG Take 1 tablet by mouth 2 (two) times daily. 60 tablet 2  . spironolactone (ALDACTONE) 25 MG tablet Take 0.5 tablets (12.5 mg total) by mouth daily. 90 tablet 3  . traZODone (DESYREL) 50 MG tablet Take 1 tablet (50 mg total) by mouth at bedtime. 30 tablet 5   No current facility-administered medications for this encounter.    No Known Allergies   Filed Vitals:   06/30/15 1041  BP: 106/74  Pulse: 65  Weight: 313 lb 6.4 oz (142.157 kg)  SpO2: 94%    PHYSICAL EXAM: General:  Obese. Sitting in [place]. No respiratory difficulty, daughter present HEENT: normal Neck: supple. Unable to clearly see JVP but appears flat; Carotids 2+ bilat; no bruits. No lymphadenopathy or thryomegaly appreciated. Cor: PMI nonpalpable. Regular rate & rhythm. 2/6 SEM RUSB. Lungs: clear Abdomen: markedly obese, soft, nontender, nondistended.  No bruits or masses. Good bowel sounds. Extremities: no cyanosis, clubbing, rash, edema. Neuro: alert & oriented x 3, cranial nerves grossly intact. moves all 4 extremities w/o difficulty. Affect pleasant.  ASSESSMENT & PLAN: 1. Chronic systolic CHF: Nonischemic cardiomyopathy.  Last echo 9/16 with EF 35-40%. Out of range for ICD. NYHA III symptoms but very limited by back and knee pain so hard to tell how limited she truly is from cardiac perspective. Weight is back to baseline after increasing Lasix back to 80 mg bid and volume status  looks ok.    - Continue Lasix 80 mg bid.   - Continue current spironolactone, Entresto, and Coreg. - BMET today to make sure creatinine remains stable.  - Reinforced the need and importance of daily weights, a low sodium diet, and fluid restriction (less than 2 L a day). Instructed to call the HF clinic if weight increases more than 3 lbs overnight or 5 lbs in a week.  2. OSA: Not using CPAP, needs appointment with sleep medicine.  3. Obesity: Needs to continue to work on weight loss (weight is going down).    Followup in 2 months  Loralie Champagne 06/30/2015

## 2015-06-30 NOTE — Patient Instructions (Signed)
Labs today  We will contact you in 2 months to schedule your next appointment.

## 2015-08-01 ENCOUNTER — Other Ambulatory Visit: Payer: Self-pay | Admitting: Internal Medicine

## 2015-08-01 DIAGNOSIS — E118 Type 2 diabetes mellitus with unspecified complications: Secondary | ICD-10-CM

## 2015-08-01 NOTE — Telephone Encounter (Signed)
Pt requesting potassium chloride,trazodone and metformin to be filled @ [facility] on [address].

## 2015-08-02 MED ORDER — POTASSIUM CHLORIDE ER 20 MEQ PO TBCR: EXTENDED_RELEASE_TABLET | ORAL | Status: DC

## 2015-08-02 MED ORDER — METFORMIN HCL 500 MG PO TABS: 500.0000 mg | ORAL_TABLET | Freq: Two times a day (BID) | ORAL | Status: DC

## 2015-08-10 ENCOUNTER — Other Ambulatory Visit (HOSPITAL_COMMUNITY): Payer: Self-pay | Admitting: Cardiology

## 2015-08-10 MED ORDER — SACUBITRIL-VALSARTAN 24-26 MG PO TABS: 1.0000 | ORAL_TABLET | Freq: Two times a day (BID) | ORAL | Status: DC

## 2015-08-18 ENCOUNTER — Other Ambulatory Visit: Payer: Self-pay | Admitting: Internal Medicine

## 2015-08-18 NOTE — Telephone Encounter (Signed)
Pt states she can not afford to get Entresto, it cost $400.00. Please call pt back.

## 2015-08-18 NOTE — Telephone Encounter (Signed)
Called pt back - Entresto ordered by Dr Haroldine Laws per EPIC. Asked if she has cardiologist, stated Dr Aundra Dubin - instructed to call his office. I called Dr Aundra Dubin - was instructed to call Dr Haroldine Laws' s office, which I did and left a message for the nurse to call the pt.

## 2015-08-19 ENCOUNTER — Other Ambulatory Visit: Payer: Self-pay | Admitting: Internal Medicine

## 2015-08-19 ENCOUNTER — Telehealth (HOSPITAL_COMMUNITY): Payer: Self-pay | Admitting: Pharmacist

## 2015-08-19 NOTE — Telephone Encounter (Signed)
Will forward this to [facility] to address.  Renee Pain

## 2015-08-19 NOTE — Telephone Encounter (Signed)
-----  Message from Effie Berkshire, RN sent at 08/19/2015 10:00 AM EST ----- IM clinic called to report patient called them about [name] being too expensive $400. Can you see what we can do for this patient?  Renee Pain

## 2015-08-19 NOTE — Telephone Encounter (Signed)
Patient's daughter came into clinic and picked up 2 weeks of Entresto samples. Also spoke with patient about bringing in a copy of her monthly SS income statement so that we can complete her [employer] patient assistance application. She stated that she will bring in on Tuesday, 1/3.   Ruta Hinds. Velva Harman, PharmD, BCPS, CPP Clinical Pharmacist Pager: [phone] Phone: [phone] 08/19/2015 2:44 PM

## 2015-08-23 ENCOUNTER — Encounter: Payer: Self-pay | Admitting: Internal Medicine

## 2015-08-28 ENCOUNTER — Encounter: Payer: Self-pay | Admitting: Internal Medicine

## 2015-09-02 ENCOUNTER — Telehealth: Payer: Self-pay | Admitting: Internal Medicine

## 2015-09-02 NOTE — Telephone Encounter (Signed)
REFILL ON

## 2015-09-02 NOTE — Telephone Encounter (Signed)
NEEDS POTASSIUM REFILL, Robin Hardin [phone]

## 2015-09-05 ENCOUNTER — Other Ambulatory Visit (HOSPITAL_COMMUNITY): Payer: Self-pay | Admitting: Pharmacist

## 2015-09-05 MED ORDER — SACUBITRIL-VALSARTAN 24-26 MG PO TABS: 1.0000 | ORAL_TABLET | Freq: Two times a day (BID) | ORAL | Status: DC

## 2015-09-14 ENCOUNTER — Telehealth (HOSPITAL_COMMUNITY): Payer: Self-pay | Admitting: Pharmacist

## 2015-09-14 NOTE — Telephone Encounter (Signed)
[employer] patient Administrator, Civil Service for [employer] at no out-of-pocket cost to patient until 09/11/2016.    Ruta Hinds. Velva Harman, PharmD, BCPS, CPP Clinical Pharmacist Pager: [phone] Phone: [phone] 09/14/2015 2:00 PM

## 2015-10-04 ENCOUNTER — Other Ambulatory Visit: Payer: Self-pay | Admitting: Internal Medicine

## 2015-10-04 DIAGNOSIS — I5042 Chronic combined systolic (congestive) and diastolic (congestive) heart failure: Secondary | ICD-10-CM

## 2015-10-04 DIAGNOSIS — E118 Type 2 diabetes mellitus with unspecified complications: Secondary | ICD-10-CM

## 2015-10-04 MED ORDER — CARVEDILOL 25 MG PO TABS: 25.0000 mg | ORAL_TABLET | Freq: Two times a day (BID) | ORAL | Status: DC

## 2015-10-04 MED ORDER — METFORMIN HCL 500 MG PO TABS: 500.0000 mg | ORAL_TABLET | Freq: Two times a day (BID) | ORAL | Status: DC

## 2015-10-15 ENCOUNTER — Other Ambulatory Visit: Payer: Self-pay | Admitting: Internal Medicine

## 2015-11-08 ENCOUNTER — Ambulatory Visit (INDEPENDENT_AMBULATORY_CARE_PROVIDER_SITE_OTHER): Payer: Self-pay | Admitting: Internal Medicine

## 2015-11-08 DIAGNOSIS — I10 Essential (primary) hypertension: Secondary | ICD-10-CM

## 2015-11-08 DIAGNOSIS — Z6841 Body Mass Index (BMI) 40.0 and over, adult: Secondary | ICD-10-CM

## 2015-11-08 DIAGNOSIS — I110 Hypertensive heart disease with heart failure: Secondary | ICD-10-CM

## 2015-11-08 DIAGNOSIS — M1A9XX0 Chronic gout, unspecified, without tophus (tophi): Secondary | ICD-10-CM

## 2015-11-08 DIAGNOSIS — F1129 Opioid dependence with unspecified opioid-induced disorder: Secondary | ICD-10-CM

## 2015-11-08 DIAGNOSIS — Z79891 Long term (current) use of opiate analgesic: Secondary | ICD-10-CM

## 2015-11-08 DIAGNOSIS — Z8639 Personal history of other endocrine, nutritional and metabolic disease: Secondary | ICD-10-CM

## 2015-11-08 DIAGNOSIS — M109 Gout, unspecified: Secondary | ICD-10-CM

## 2015-11-08 DIAGNOSIS — G4733 Obstructive sleep apnea (adult) (pediatric): Secondary | ICD-10-CM

## 2015-11-08 DIAGNOSIS — F32A Depression, unspecified: Secondary | ICD-10-CM

## 2015-11-08 DIAGNOSIS — I5042 Chronic combined systolic (congestive) and diastolic (congestive) heart failure: Secondary | ICD-10-CM

## 2015-11-08 DIAGNOSIS — F329 Major depressive disorder, single episode, unspecified: Secondary | ICD-10-CM

## 2015-11-08 DIAGNOSIS — M170 Bilateral primary osteoarthritis of knee: Secondary | ICD-10-CM

## 2015-11-08 LAB — POCT GLYCOSYLATED HEMOGLOBIN (HGB A1C): HEMOGLOBIN A1C: 5.5

## 2015-11-08 LAB — GLUCOSE, CAPILLARY: Glucose-Capillary: 84 mg/dL (ref 65–99)

## 2015-11-08 MED ORDER — LOVASTATIN 40 MG PO TABS: 40.0000 mg | ORAL_TABLET | Freq: Every day | ORAL | Status: DC

## 2015-11-08 MED ORDER — LIRAGLUTIDE 18 MG/3ML ~~LOC~~ SOPN: 0.6000 mg | PEN_INJECTOR | Freq: Every day | SUBCUTANEOUS | Status: DC

## 2015-11-08 MED ORDER — ALLOPURINOL 300 MG PO TABS: 300.0000 mg | ORAL_TABLET | Freq: Every day | ORAL | Status: DC

## 2015-11-08 MED ORDER — HYDROCODONE-ACETAMINOPHEN 5-325 MG PO TABS: 1.0000 | ORAL_TABLET | Freq: Four times a day (QID) | ORAL | Status: DC | PRN

## 2015-11-08 NOTE — Patient Instructions (Signed)
-  Call to schedule an appointment with Dr. Claris Gladden office - Start taking Liraglutide 0.6 mg daily to help with weight loss. This is a medicine that you inject.  - Start using your CPAP machine every night! Try to wear it as long as you can. - I would like to make a referral to psychiatry as they are the experts for depression medications  General Instructions:   Thank you for bringing your medicines today. This helps [place] keep you safe from mistakes.   Progress Toward Treatment Goals:  Treatment Goal 02/22/2015  Hemoglobin A1C at goal  Blood pressure at goal    Self Care Goals & Plans:  Self Care Goal 11/08/2015  Manage my medications take my medicines as prescribed; bring my medications to every visit; refill my medications on time  Monitor my health bring my glucose meter and log to each visit; keep track of my blood pressure; keep track of my blood glucose; check my feet daily  Eat healthy foods eat more vegetables; eat foods that are low in salt; eat baked foods instead of fried foods  Be physically active find an activity I enjoy  Prevent falls wear appropriate shoes; have my vision checked    No flowsheet data found.   Care Management & Community Referrals:  Referral 06/02/2013  Referrals made to community resources weight management; exercise/physical therapy; nutrition

## 2015-11-09 ENCOUNTER — Encounter: Payer: Self-pay | Admitting: Internal Medicine

## 2015-11-09 NOTE — Assessment & Plan Note (Signed)
No recent flares. Refilled her Allopurinol.

## 2015-11-09 NOTE — Assessment & Plan Note (Signed)
Her depression appears to be not well controlled on Prozac and in fact seems severe. She has significant anhedonia, feelings of sadness, and decreased energy levels. It appears that she was on Celexa in the past but was switched to Prozac as Celexa did not work for her. I think at this point it would be appropriate for her to be seen by psychiatry to see if she would benefit from an atypical antipsychotic. I discussed this with her and she agrees that she would be fine seeing a psychiatrist.  - Referral to psychiatry - Continue Prozac for now until seen by psych

## 2015-11-09 NOTE — Assessment & Plan Note (Signed)
At this point, I do not believe that she actually has diabetes since her A1c is so low. We discussed that keeping Metformin on solely for weight loss (as she believes this was prescribed) is not a good reason to keep taking this medication. We have agreed to stop this medication. I will still have her continue to get eye exams as there is not good evidence to support stopping this once diabetes resolves.  - Stop Metformin - Will get her in for eye exam at next visit (3 months)

## 2015-11-09 NOTE — Assessment & Plan Note (Signed)
BP well controlled at 110/60. Continue Coreg, Lasix, Entresto, and Spironolactone.

## 2015-11-09 NOTE — Assessment & Plan Note (Signed)
We discussed why it is important for her to wear her CPAP and the consequences on her heart from pulmonary hypertension. She has agreed to try to wear her CPAP nightly for as long as she can tolerate it.

## 2015-11-09 NOTE — Assessment & Plan Note (Signed)
Her pain seems adequately controlled with Norco 5-325 mg twice daily. I do believe her when she describes her depression preventing her from refilling her Norco prescription as scheduled. Her last UDS was negative for any opioids despite being on therapy but this is due to a lab error that was occurring with all UDS samples at that time. I will repeat a UDS today to re-evaluate. I will give her 3 prescriptions for Norco 5-325 mg TID PRN #90 tablets and have her follow up in 3 months to reassess.

## 2015-11-09 NOTE — Progress Notes (Signed)
   Subjective:    [name], female    DOB: Jun 12, 1951, 65 y.o.   MRN: [id number]  HPI Robin Hardin is a 65yo woman with PMHx of HTN, combined systolic and diastolic HF, type 2 diabetes, and OSA who presents today for follow up of her hypertension.  HTN: BP well controlled at 110/60. In Nov 2016, she was switched from lisinopril to [facility] by cardiology. She now takes Coreg 25 mg BID, Lasix 80 mg BID, Entresto 24-26 mg BID, and Spironolactone 12.5 mg daily.   Hx DM: Last A1c 5.1. Her A1c today is 5.5. She takes Metformin 500 mg BID.   Combined CHF: EF 35-40% per last echo in Sept 2016. She follows with Dr. Aundra Dubin and last saw him in Nov 2016. She weighed 313 lbs at that time (stable) and today weighs 306 lbs. She takes Coreg, [place], and Spironolactone.  OA of Knees: She reports she has not been as active in the last 2-3 months due to severe depression. As a result of this she did not fill her Norco prescription in December and waited until February to fill it. She states she was spending a lot of time in bed and did have severe pain in her knee but was too depressed to leave the house. She is currently taking Norco 5-325 mg 1 tablet twice daily. She notes some days she will need a third dose.   Gout: She denies any recent flares. She takes Allopurinol 300 mg daily.  Depression: As noted above, she reports the last 2-3 months have been very difficult for her. She describes having days where she breaks down and cries for no reason. She reports not being active and leaving the house very little. She describes being "scared to go outside." She reports staying in bed most of the day as she felt she could not bring herself to get out of bed. She feels she does not enjoy things as she used to. She has been taking her Prozac 80 mg daily but feels it is not helping much. She denies any suicidal or homicidal ideation.   OSA: She reports she has not been using her CPAP regularly as she  cannot sleep well with the mask on.    Review of Systems General: Denies fever, chills, night sweats, changes in appetite HEENT: Denies headaches, ear pain, changes in vision, rhinorrhea, sore throat CV: Denies CP, palpitations, SOB, orthopnea Pulm: Denies SOB, cough, wheezing GI: Denies abdominal pain, nausea, vomiting, diarrhea, constipation, melena, hematochezia GU: Denies dysuria, hematuria, frequency Msk: Denies muscle cramps, joint pains Neuro: Denies weakness, numbness, tingling Skin: Denies rashes, bruising Psych: Denies hallucinations    Objective:   Physical Exam General: morbidly obese woman sitting up, NAD, pleasant HEENT: [place]/AT, EOMI, sclera anicteric, mucus membranes moist CV: RRR, no m/g/r Pulm: CTA bilaterally, breaths non-labored Abd: BS+, soft, obese, non-tender Ext: warm, no peripheral edema Neuro: alert and oriented x 3     Assessment & Plan:  Please refer to A&P documentation.

## 2015-11-09 NOTE — Assessment & Plan Note (Signed)
Her weight has actually decreased by 7 lbs since her last visit with Dr. Aundra Dubin. She does not appear fluid overloaded on exam. Continue ASA, Coreg, Entresto, and Spironolactone.

## 2015-11-09 NOTE — Assessment & Plan Note (Signed)
We discussed starting Liraglutide as this has recently been approved for weight loss. She is motivated to lose weight but is having difficulty staying focused on lifestyle modifications such as being active due to her depression. We will try Liraglutide 0.6 mg daily and then move up to 1.2 mg daily at her next visit. We can expect a 10-15 lb weight loss from Liraglutide. Given her morbid obesity and comorbidities related to obesity she is likely a candidate for bariatric surgery. We will try medical therapy first and get her depression under control and then consider surgery.

## 2015-11-10 ENCOUNTER — Encounter: Payer: Self-pay | Admitting: Licensed Clinical Social Worker

## 2015-11-10 NOTE — Progress Notes (Signed)
Internal Medicine Clinic Attending  Case discussed with Dr. Arcelia Jew at the time of the visit.  We reviewed the resident's history and exam and pertinent patient test results.  I agree with the assessment, diagnosis, and plan of care documented in the resident's note.

## 2015-11-12 LAB — TOXASSURE SELECT,+ANTIDEPR,UR: PDF: 0

## 2015-11-24 ENCOUNTER — Telehealth: Payer: Self-pay | Admitting: Internal Medicine

## 2015-11-24 NOTE — Telephone Encounter (Signed)
Need to speak to nurse

## 2015-11-25 NOTE — Telephone Encounter (Signed)
LVM for return call

## 2015-11-29 NOTE — Telephone Encounter (Signed)
LVM again for return call.

## 2015-12-07 ENCOUNTER — Telehealth (HOSPITAL_COMMUNITY): Payer: Self-pay

## 2015-12-07 NOTE — Telephone Encounter (Addendum)
Patient dropped off disability paperwork to be completed by CHF clinic, hwoever, patient has not been seen in 6 months and needs a more recent appointment with provider before paperwork can be completed. Left VM for patient to return our call to make apt to be evaluated by provider and to have paperwork completed.  Renee Pain

## 2015-12-14 ENCOUNTER — Encounter: Payer: Self-pay | Admitting: Internal Medicine

## 2015-12-19 ENCOUNTER — Encounter: Payer: Self-pay | Admitting: Internal Medicine

## 2015-12-20 ENCOUNTER — Other Ambulatory Visit: Payer: Self-pay | Admitting: *Deleted

## 2015-12-20 DIAGNOSIS — F329 Major depressive disorder, single episode, unspecified: Secondary | ICD-10-CM

## 2015-12-20 DIAGNOSIS — F32A Depression, unspecified: Secondary | ICD-10-CM

## 2015-12-20 MED ORDER — FLUOXETINE HCL 40 MG PO CAPS: 80.0000 mg | ORAL_CAPSULE | Freq: Every day | ORAL | Status: DC

## 2016-01-09 ENCOUNTER — Telehealth (HOSPITAL_COMMUNITY): Payer: Self-pay | Admitting: *Deleted

## 2016-01-09 NOTE — Telephone Encounter (Signed)
Pt called stating she had dropped off disability forms in April and has not not heard anything about them being completed.  Per chart forms were dropped off 4/19 and Vilinda Blanks, RN tried to contact pt to sch a f/u appt as she had not been seen in 6 months.  appt sch for Wed 5/24

## 2016-01-11 ENCOUNTER — Ambulatory Visit (HOSPITAL_COMMUNITY)
Admission: RE | Admit: 2016-01-11 | Discharge: 2016-01-11 | Disposition: A | Discharge status: Home / Self Care | Payer: Self-pay | Source: Ambulatory Visit | Attending: Cardiology | Admitting: Cardiology

## 2016-01-11 ENCOUNTER — Encounter (HOSPITAL_COMMUNITY): Payer: Self-pay

## 2016-01-11 VITALS — BP 112/68 | HR 71 | Wt 293.0 lb

## 2016-01-11 DIAGNOSIS — M109 Gout, unspecified: Secondary | ICD-10-CM | POA: Insufficient documentation

## 2016-01-11 DIAGNOSIS — Z823 Family history of stroke: Secondary | ICD-10-CM | POA: Insufficient documentation

## 2016-01-11 DIAGNOSIS — E1122 Type 2 diabetes mellitus with diabetic chronic kidney disease: Secondary | ICD-10-CM | POA: Insufficient documentation

## 2016-01-11 DIAGNOSIS — G4733 Obstructive sleep apnea (adult) (pediatric): Secondary | ICD-10-CM | POA: Insufficient documentation

## 2016-01-11 DIAGNOSIS — I5042 Chronic combined systolic (congestive) and diastolic (congestive) heart failure: Secondary | ICD-10-CM

## 2016-01-11 DIAGNOSIS — M25561 Pain in right knee: Secondary | ICD-10-CM | POA: Insufficient documentation

## 2016-01-11 DIAGNOSIS — M25562 Pain in left knee: Secondary | ICD-10-CM | POA: Insufficient documentation

## 2016-01-11 DIAGNOSIS — Z7982 Long term (current) use of aspirin: Secondary | ICD-10-CM | POA: Insufficient documentation

## 2016-01-11 DIAGNOSIS — R079 Chest pain, unspecified: Secondary | ICD-10-CM

## 2016-01-11 DIAGNOSIS — Z8249 Family history of ischemic heart disease and other diseases of the circulatory system: Secondary | ICD-10-CM | POA: Insufficient documentation

## 2016-01-11 DIAGNOSIS — Z79899 Other long term (current) drug therapy: Secondary | ICD-10-CM | POA: Insufficient documentation

## 2016-01-11 DIAGNOSIS — E785 Hyperlipidemia, unspecified: Secondary | ICD-10-CM | POA: Insufficient documentation

## 2016-01-11 DIAGNOSIS — N189 Chronic kidney disease, unspecified: Secondary | ICD-10-CM | POA: Insufficient documentation

## 2016-01-11 DIAGNOSIS — I130 Hypertensive heart and chronic kidney disease with heart failure and stage 1 through stage 4 chronic kidney disease, or unspecified chronic kidney disease: Secondary | ICD-10-CM | POA: Insufficient documentation

## 2016-01-11 DIAGNOSIS — F329 Major depressive disorder, single episode, unspecified: Secondary | ICD-10-CM | POA: Insufficient documentation

## 2016-01-11 DIAGNOSIS — I5022 Chronic systolic (congestive) heart failure: Secondary | ICD-10-CM | POA: Insufficient documentation

## 2016-01-11 DIAGNOSIS — Z825 Family history of asthma and other chronic lower respiratory diseases: Secondary | ICD-10-CM | POA: Insufficient documentation

## 2016-01-11 DIAGNOSIS — I428 Other cardiomyopathies: Secondary | ICD-10-CM | POA: Insufficient documentation

## 2016-01-11 LAB — BASIC METABOLIC PANEL
ANION GAP: 10 (ref 5–15)
BUN: 51 mg/dL — AB (ref 6–20)
CHLORIDE: 106 mmol/L (ref 101–111)
CO2: 27 mmol/L (ref 22–32)
Calcium: 9.4 mg/dL (ref 8.9–10.3)
Creatinine, Ser: 2.07 mg/dL — ABNORMAL HIGH (ref 0.44–1.00)
GFR calc Af Amer: 28 mL/min — ABNORMAL LOW (ref >60)
GFR, EST NON AFRICAN AMERICAN: 24 mL/min — AB (ref >60)
GLUCOSE: 107 mg/dL — AB (ref 65–99)
POTASSIUM: 3.6 mmol/L (ref 3.5–5.1)
Sodium: 143 mmol/L (ref 135–145)

## 2016-01-11 LAB — LIPID PANEL
CHOL/HDL RATIO: 5 ratio
Cholesterol: 295 mg/dL — ABNORMAL HIGH (ref 0–200)
HDL: 59 mg/dL (ref >40)
LDL CALC: 221 mg/dL — AB (ref 0–99)
TRIGLYCERIDES: 74 mg/dL (ref <150)
VLDL: 15 mg/dL (ref 0–40)

## 2016-01-11 LAB — BRAIN NATRIURETIC PEPTIDE: B NATRIURETIC PEPTIDE 5: 12.9 pg/mL (ref 0.0–100.0)

## 2016-01-11 NOTE — Progress Notes (Signed)
[name], female   DOB: 05-30-1951, 65 y.o.   MRN: [id number]    Advanced Heart Failure Clinic Note   Primary Care: Dr Arcelia Jew Primary Cardiologist: Dr. Aundra Dubin  HPI:  Robin Hardin is a 65 y.o. AAF with history of morbid obesity, HTN, and diastolic HF diagnosed in 5300.  She was eventually diagnosed with systolic heart failure by v-gram (09/2012) with an EF approximately 25%.  LHC showed no significant coronary disease.  She also has OSA with CPAP.   She had stopped taking her fluid pills in Feb 2014 due to cramping and ended up being admitted with respiratory failure.  She was diuresed ~40 pounds.  Her torsemide was changed to lasix to avoid cramping.  Echo showed LVEF 25%. Discharge weight 387 lbs. Echo in 12/14 showed EF 40-45% with mild LV dilation.  Last echo in 9/16 showed EF 35-40% with grade II diastolic dysfunction.   She reports today for regular follow up.  No changes at last appointment as she had a recent creatinine bump. Creatinine much better at that check.  She is down 20 lbs from her last visit. She has been watching her diet, but hasn't really been able to increase activity.  For the past couple of months she has been having a "real bad feeling" in her chest.  States it happens when she walked for a few steps and stops.  Occasionally happens at rest.  She is a little unclear about this sensation => ?dyspnea.  Gets very SOB walking up stairs.  Doesn't walk much on flat ground so difficult to say if she has DOE. Fatigues very easily and mostly limited by back and knee pain.  No lightheadedness, but occasionally feels dizzy or "unbalanced" when taking a step from one level to another (like off of a curb).  Labs (7/14): K 3.9 Creatinine 0.91, LDL 105 Labs (06/05/13): K 3.7 Creatinine 0.87 Pro BNP 56  Labs (1/15): K 4, creatinine 0.9 Labs (07/27/14): K 4.2 Creatinine 0.92 Hgb 11.8 HIV NR Labs (5/16): K 5, creatinine 2.57, TSH normal, HCT 36.5 Labs (7/16): K 4,  creatinine 1.08, LDL 132 Labs (9/16): K 3.6, creatinine 1.17 Labs (10/16): creatinine 2.19 => 1.34 Labs (11/16): K 4, creatinine 1.0, BNP 114  Past Medical History:  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 She was hospitalized at [facility] in [place] where she had a cath showing normal coronaries but EF 30-35% on LV-gram. She has been hospitalized two other times since then in [place] for CHF. Echo (8/10) showed EF 45-50%, mild global HK, mild LVH, grade I diastolic dysfunction, no significant valvular problems.  Echo (2/14) with EF severely decreased (poor windows so not quantified, appears about 25%).  Echo (12/14) with EF 40-45%, mild LV dilation, moderate LVH, moderate LAE.  Echo (9/16) with EF 35-40%, grade II diastolic dysfunction, mild MR.  2. HTN  3. Depression  4. Morbid obesity  5. Hyperlipidemia  6. OSA: sporadic with CPAP  7. Gout on allopurinol  8. Bilateral knee pain, probable osteoarthritis  9. Diabetes type II  10. Left lower leg cellulitis in 10/11 with normal [place] for DVT 11. CKD.   Family History:  No FH of premature CAD  asthma: mother, 2 brothers  heart disease: mother  clotting disorders: mother (stroke)   Social History:  Recently from [place] to [place].  Married with 2 adopted children.  Never smoked, no ETOH or drugs.  retired/on disability. previously worked as a  nurses aid.   Review of Systems  All systems reviewed and negative except as per HPI.   Current Outpatient Prescriptions  Medication Sig Dispense Refill  . allopurinol (ZYLOPRIM) 300 MG tablet Take 1 tablet (300 mg total) by mouth daily. 90 tablet 1  . ALOE PO Take 1 capsule by mouth 2 (two) times daily.    Marland Kitchen aspirin EC 81 MG EC tablet Take 1 tablet (81 mg total) by mouth daily. 30 tablet 2  . carvedilol (COREG) 25 MG tablet Take 1 tablet (25 mg total) by mouth 2 (two) times daily with a meal. 180 tablet 1  . FLUoxetine (PROZAC) 40 MG capsule Take 2 capsules (80 mg  total) by mouth daily. 60 capsule 5  . furosemide (LASIX) 80 MG tablet Take 1 tablet (80 mg total) by mouth 2 (two) times daily. 60 tablet 5  . HYDROcodone-acetaminophen (NORCO/VICODIN) 5-325 MG tablet Take 1 tablet by mouth every 6 (six) hours as needed for severe pain. 90 tablet 0  . Liraglutide 18 MG/3ML SOPN Inject 0.1 mLs (0.6 mg total) into the skin daily. 6 mL 5  . lovastatin (MEVACOR) 40 MG tablet Take 1 tablet (40 mg total) by mouth at bedtime. 30 tablet 5  . Potassium Chloride ER 20 MEQ TBCR TAKE TWO TABLETS BY MOUTH ONCE DAILY. PLEASE ADVISE PATIENT TO CALL FOR AN APPOINTMENT [phone]. 180 tablet 1  . sacubitril-valsartan (ENTRESTO) 24-26 MG Take 1 tablet by mouth 2 (two) times daily. 60 tablet 11  . spironolactone (ALDACTONE) 25 MG tablet Take 0.5 tablets (12.5 mg total) by mouth daily. 90 tablet 3  . traZODone (DESYREL) 50 MG tablet Take 1 tablet (50 mg total) by mouth at bedtime. 30 tablet 5   No current facility-administered medications for this encounter.    No Known Allergies   Filed Vitals:   01/11/16 0927  Pulse: 71  Weight: 293 lb (132.904 kg)  SpO2: 97%   Wt Readings from Last 3 Encounters:  01/11/16 293 lb (132.904 kg)  11/08/15 306 lb 6.4 oz (138.982 kg)  06/30/15 313 lb 6.4 oz (142.157 kg)     PHYSICAL EXAM: General:  Obese. Sitting in [place]. No respiratory difficulty, daughter present HEENT: normal Neck: supple. JVP with pt size but does not appear elevated. Carotids 2+ bilat; no bruits. No thyromegaly or nodule noted. Cor: PMI nonpalpable. Regular rate & rhythm. 2/6 SEM RUSB. Lungs: CTAB, normal effort Abdomen: markedly obese, soft, NT, ND, no HSM. No bruits or masses. +BS  Extremities: no cyanosis, clubbing, rash. No edema. Neuro: alert & oriented x 3, cranial nerves grossly intact. moves all 4 extremities w/o difficulty. Affect pleasant.  ASSESSMENT & PLAN: 1. Chronic systolic CHF: Nonischemic cardiomyopathy.  Last echo 9/16 with EF 35-40%. Out of range  for ICD. NYHA III symptoms but very limited by back and knee pain so hard to tell how limited she truly is from cardiac perspective.  - Continue Lasix 80 mg bid.  Check BMET today.  - Continue spironolactone 12.5 mg daily.  She has been unable to increase to 25 mg daily as she gets nauseated. - Continue Entresto 24/26 BID and Coreg 25 mg BID - Reinforced the need and importance of daily weights, a low sodium diet, and fluid restriction (less than 2 L a day). Instructed to call the HF clinic if weight increases more than 3 lbs overnight or 5 lbs in a week.  2. OSA: Not using CPAP, needs appointment with sleep medicine.  3. Obesity:  -  Congratulated on her continued weight loss. Encouraged to continue diet and increase activity as able.  4. ? Angina vs DOE: She is a bit unclear about her chest symptoms => DOE versus angina.  - Will do lexiscan cardiolite stress test to make sure she hasn't developed and blockages. Last Heart Cath looks like 2003, no significant CAD. Marland Kitchen  Stress test as above.  Will see back in 3 months if normal.  Labs as above.                                                                 Shirley Friar, PA-C 01/11/2016   Patient seen with PA , agree with the above note.  She remains limited with little daily activity.  She "feels bad in the chest" with exertion, but this seems on further questioning to be dyspnea.  No definite chest pain . However, I agree that given the exertional symptoms (relatively new), she should have a Lexiscan Cardiolite for risk stratification.  She will continue her other meds, I will not titrate up spironolactone (she got nauseated on higher dose in the past).  Today, will check BMET/BNP and lipids.    Loralie Champagne 01/11/2016

## 2016-01-11 NOTE — Patient Instructions (Signed)
Labs today  Your physician has requested that you have a lexiscan myoview. For further information please visit [email or web address]. Please follow instruction sheet, as given.  Your physician recommends that you schedule a follow-up appointment in: 3 months

## 2016-01-13 ENCOUNTER — Telehealth (HOSPITAL_COMMUNITY): Payer: Self-pay | Admitting: *Deleted

## 2016-01-13 DIAGNOSIS — I5042 Chronic combined systolic (congestive) and diastolic (congestive) heart failure: Secondary | ICD-10-CM

## 2016-01-13 MED ORDER — FUROSEMIDE 80 MG PO TABS: 40.0000 mg | ORAL_TABLET | Freq: Two times a day (BID) | ORAL | Status: DC

## 2016-01-13 MED ORDER — ROSUVASTATIN CALCIUM 20 MG PO TABS: 20.0000 mg | ORAL_TABLET | Freq: Every day | ORAL | Status: DC

## 2016-01-13 NOTE — Telephone Encounter (Signed)
-----  Message from Larey Dresser, MD sent at 01/13/2016  9:33 AM EDT ----- Stop Lasix x 3 days, then decrease to 40 mg bid.  Stop Entresto for now.  Needs followup in office in 10 days.  BMET next week.  Stop lovastatin and start Crestor 20 mg daily with markedly high LDL.  Please call.

## 2016-01-13 NOTE — Telephone Encounter (Signed)
Notes Recorded by Scarlette Calico, RN on 01/13/2016 at 2:12 PM Pt aware, agreeable and verbalizes understanding, appt sch for [facility] 6/1

## 2016-01-18 ENCOUNTER — Telehealth (HOSPITAL_COMMUNITY): Payer: Self-pay | Admitting: *Deleted

## 2016-01-18 NOTE — Telephone Encounter (Signed)
Patient given detailed instructions per Myocardial Perfusion Study Information Sheet for the test on 01/23/16. Patient notified to arrive 15 minutes early and that it is imperative to arrive on time for appointment to keep from having the test rescheduled.  If you need to cancel or reschedule your appointment, please call the office within 24 hours of your appointment. Failure to do so may result in a cancellation of your appointment, and a $50 no show fee. Patient verbalized understanding.Hubbard Robinson, RN

## 2016-01-19 ENCOUNTER — Ambulatory Visit (HOSPITAL_COMMUNITY): Payer: Self-pay

## 2016-01-19 ENCOUNTER — Telehealth (HOSPITAL_COMMUNITY): Payer: Self-pay | Admitting: Student

## 2016-01-19 ENCOUNTER — Ambulatory Visit (HOSPITAL_COMMUNITY)
Admission: RE | Admit: 2016-01-19 | Discharge: 2016-01-19 | Disposition: A | Discharge status: Home / Self Care | Payer: Self-pay | Source: Ambulatory Visit | Attending: Internal Medicine | Admitting: Internal Medicine

## 2016-01-19 VITALS — BP 116/68 | HR 63 | Wt 300.2 lb

## 2016-01-19 DIAGNOSIS — I130 Hypertensive heart and chronic kidney disease with heart failure and stage 1 through stage 4 chronic kidney disease, or unspecified chronic kidney disease: Secondary | ICD-10-CM | POA: Insufficient documentation

## 2016-01-19 DIAGNOSIS — M25562 Pain in left knee: Secondary | ICD-10-CM | POA: Insufficient documentation

## 2016-01-19 DIAGNOSIS — G4733 Obstructive sleep apnea (adult) (pediatric): Secondary | ICD-10-CM | POA: Insufficient documentation

## 2016-01-19 DIAGNOSIS — M25561 Pain in right knee: Secondary | ICD-10-CM | POA: Insufficient documentation

## 2016-01-19 DIAGNOSIS — I5042 Chronic combined systolic (congestive) and diastolic (congestive) heart failure: Secondary | ICD-10-CM

## 2016-01-19 DIAGNOSIS — Z79899 Other long term (current) drug therapy: Secondary | ICD-10-CM | POA: Insufficient documentation

## 2016-01-19 DIAGNOSIS — I5022 Chronic systolic (congestive) heart failure: Secondary | ICD-10-CM | POA: Insufficient documentation

## 2016-01-19 DIAGNOSIS — E785 Hyperlipidemia, unspecified: Secondary | ICD-10-CM | POA: Insufficient documentation

## 2016-01-19 DIAGNOSIS — Z825 Family history of asthma and other chronic lower respiratory diseases: Secondary | ICD-10-CM | POA: Insufficient documentation

## 2016-01-19 DIAGNOSIS — I10 Essential (primary) hypertension: Secondary | ICD-10-CM

## 2016-01-19 DIAGNOSIS — E669 Obesity, unspecified: Secondary | ICD-10-CM | POA: Insufficient documentation

## 2016-01-19 DIAGNOSIS — M109 Gout, unspecified: Secondary | ICD-10-CM | POA: Insufficient documentation

## 2016-01-19 DIAGNOSIS — N183 Chronic kidney disease, stage 3 (moderate): Secondary | ICD-10-CM | POA: Insufficient documentation

## 2016-01-19 DIAGNOSIS — M549 Dorsalgia, unspecified: Secondary | ICD-10-CM | POA: Insufficient documentation

## 2016-01-19 DIAGNOSIS — Z8249 Family history of ischemic heart disease and other diseases of the circulatory system: Secondary | ICD-10-CM | POA: Insufficient documentation

## 2016-01-19 DIAGNOSIS — Z6841 Body Mass Index (BMI) 40.0 and over, adult: Secondary | ICD-10-CM

## 2016-01-19 DIAGNOSIS — F329 Major depressive disorder, single episode, unspecified: Secondary | ICD-10-CM | POA: Insufficient documentation

## 2016-01-19 DIAGNOSIS — I428 Other cardiomyopathies: Secondary | ICD-10-CM | POA: Insufficient documentation

## 2016-01-19 DIAGNOSIS — N179 Acute kidney failure, unspecified: Secondary | ICD-10-CM | POA: Insufficient documentation

## 2016-01-19 DIAGNOSIS — E1122 Type 2 diabetes mellitus with diabetic chronic kidney disease: Secondary | ICD-10-CM | POA: Insufficient documentation

## 2016-01-19 DIAGNOSIS — Z7982 Long term (current) use of aspirin: Secondary | ICD-10-CM | POA: Insufficient documentation

## 2016-01-19 LAB — BASIC METABOLIC PANEL
ANION GAP: 8 (ref 5–15)
BUN: 25 mg/dL — ABNORMAL HIGH (ref 6–20)
CO2: 27 mmol/L (ref 22–32)
Calcium: 9.4 mg/dL (ref 8.9–10.3)
Chloride: 106 mmol/L (ref 101–111)
Creatinine, Ser: 1.39 mg/dL — ABNORMAL HIGH (ref 0.44–1.00)
GFR, EST AFRICAN AMERICAN: 45 mL/min — AB (ref >60)
GFR, EST NON AFRICAN AMERICAN: 39 mL/min — AB (ref >60)
GLUCOSE: 94 mg/dL (ref 65–99)
POTASSIUM: 3.9 mmol/L (ref 3.5–5.1)
SODIUM: 141 mmol/L (ref 135–145)

## 2016-01-19 MED ORDER — ROSUVASTATIN CALCIUM 20 MG PO TABS: 20.0000 mg | ORAL_TABLET | Freq: Every day | ORAL | Status: DC

## 2016-01-19 MED ORDER — SACUBITRIL-VALSARTAN 24-26 MG PO TABS: 1.0000 | ORAL_TABLET | Freq: Two times a day (BID) | ORAL | Status: DC

## 2016-01-19 MED ORDER — FUROSEMIDE 80 MG PO TABS: ORAL_TABLET | ORAL | Status: DC

## 2016-01-19 NOTE — Telephone Encounter (Signed)
Creatinine much better on repeat labs.  OK to continue Entresto (had not held as directed)  Increase lasix to 80 mg q am and 40 mg q pm as her volume is coming up slightly.   Spoke with patient. She verbalized understanding and repeated orders back to me.   Labs and follow up as arranged this morning at her appointment.   Legrand Como [address]" [place], PA-C 01/19/2016 1:27 PM

## 2016-01-19 NOTE — Progress Notes (Signed)
[name], female   DOB: [birth year], 65 y.o.   MRN: [id number]    Advanced Heart Failure Clinic Note   Primary Care: Dr Arcelia Jew Primary Cardiologist: Dr. Aundra Dubin  HPI:  [name] is a 65 y.o. AAF with history of morbid obesity, HTN, and diastolic HF diagnosed in 5400.  She was eventually diagnosed with systolic heart failure by v-gram (09/2012) with an EF approximately 25%.  LHC showed no significant coronary disease.  She also has OSA with CPAP.   She had stopped taking her fluid pills in Feb 2014 due to cramping and ended up being admitted with respiratory failure.  She was diuresed ~40 pounds.  Her torsemide was changed to lasix to avoid cramping.  Echo showed LVEF 25%. Discharge weight 387 lbs. Echo in 12/14 showed EF 40-45% with mild LV dilation.  Last echo in 9/16 showed EF 35-40% with grade II diastolic dysfunction.   She reports today for add on with irregular labs at last visit. Creatinine up from 1.0 -> 2.07 (though there was a 6 month gap) BUN 61.  Lasix held for 3 days, and then resumed at decreased dose. She was supposed to stop Entresto, but did not understand and kept taking. She is up 7 lbs from that visit, although she states she is feeling better.  Still getting fatigued easily, but no longer having "bad feeling" in her chest. Still mostly limited by LBP. Has occasional lightheadedness, but not marked or limiting. She states she does have decreased urine output.   Labs (7/14): K 3.9 Creatinine 0.91, LDL 105 Labs (06/05/13): K 3.7 Creatinine 0.87 Pro BNP 56  Labs (1/15): K 4, creatinine 0.9 Labs (07/27/14): K 4.2 Creatinine 0.92 Hgb 11.8 HIV NR Labs (5/16): K 5, creatinine 2.57, TSH normal, HCT 36.5 Labs (7/16): K 4, creatinine 1.08, LDL 132 Labs (9/16): K 3.6, creatinine 1.17 Labs (10/16): creatinine 2.19 => 1.34 Labs (11/16): K 4, creatinine 1.0, BNP 114  Past Medical History:  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 She was  hospitalized at [facility] in [place] where she had a cath showing normal coronaries but EF 30-35% on LV-gram. She has been hospitalized two other times since then in [place] for CHF. Echo (8/10) showed EF 45-50%, mild global HK, mild LVH, grade I diastolic dysfunction, no significant valvular problems.  Echo (2/14) with EF severely decreased (poor windows so not quantified, appears about 25%).  Echo (12/14) with EF 40-45%, mild LV dilation, moderate LVH, moderate LAE.  Echo (9/16) with EF 35-40%, grade II diastolic dysfunction, mild MR.  2. HTN  3. Depression  4. Morbid obesity  5. Hyperlipidemia  6. OSA: sporadic with CPAP  7. Gout on allopurinol  8. Bilateral knee pain, probable osteoarthritis  9. Diabetes type II  10. Left lower leg cellulitis in 10/11 with normal [place] for DVT 11. CKD.   Family History:  No FH of premature CAD  asthma: mother, 2 brothers  heart disease: mother  clotting disorders: mother (stroke)   Social History:  Recently from [place] to [place].  Married with 2 adopted children.  Never smoked, no ETOH or drugs.  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   Review of Systems  All systems reviewed and negative except as per HPI.   Current Outpatient Prescriptions  Medication Sig Dispense Refill  . allopurinol (ZYLOPRIM) 300 MG tablet Take 1 tablet (300 mg total) by mouth daily. 90 tablet 1  . ALOE PO  Take 1 capsule by mouth 2 (two) times daily.    Marland Kitchen aspirin EC 81 MG EC tablet Take 1 tablet (81 mg total) by mouth daily. 30 tablet 2  . carvedilol (COREG) 25 MG tablet Take 1 tablet (25 mg total) by mouth 2 (two) times daily with a meal. 180 tablet 1  . FLUoxetine (PROZAC) 40 MG capsule Take 2 capsules (80 mg total) by mouth daily. 60 capsule 5  . furosemide (LASIX) 80 MG tablet Take 0.5 tablets (40 mg total) by mouth 2 (two) times daily. 60 tablet 5  . HYDROcodone-acetaminophen (NORCO/VICODIN) 5-325 MG tablet Take 1 tablet by mouth every 6  (six) hours as needed for severe pain. 90 tablet 0  . Liraglutide 18 MG/3ML SOPN Inject 0.1 mLs (0.6 mg total) into the skin daily. 6 mL 5  . Potassium Chloride ER 20 MEQ TBCR TAKE TWO TABLETS BY MOUTH ONCE DAILY. PLEASE ADVISE PATIENT TO CALL FOR AN APPOINTMENT [phone]. 180 tablet 1  . spironolactone (ALDACTONE) 25 MG tablet Take 0.5 tablets (12.5 mg total) by mouth daily. 90 tablet 3  . traZODone (DESYREL) 50 MG tablet Take 1 tablet (50 mg total) by mouth at bedtime. 30 tablet 5   No current facility-administered medications for this encounter.    No Known Allergies   Filed Vitals:   01/19/16 1023  BP: 116/68  Pulse: 63  Weight: 300 lb 4 oz (136.193 kg)  SpO2: 96%   Wt Readings from Last 3 Encounters:  01/19/16 300 lb 4 oz (136.193 kg)  01/11/16 293 lb (132.904 kg)  11/08/15 306 lb 6.4 oz (138.982 kg)     PHYSICAL EXAM: General:  Obese. Sitting in [place]. No respiratory difficulty, daughter present HEENT: normal Neck: supple. JVP somewhat elevated today.. Carotids 2+ bilat; no bruits. No thyromegaly or nodule noted. Cor: PMI nonpalpable. Regular rate & rhythm. 2/6 SEM RUSB. Lungs: CTAB, normal effort.  Abdomen: markedly obese, soft, non-tender, mildly distended, no HSM. No bruits or masses. +BS  Extremities: no cyanosis, clubbing, rash. Trace ankle edema.  Neuro: alert & oriented x 3, cranial nerves grossly intact. moves all 4 extremities w/o difficulty. Affect pleasant.  ASSESSMENT & PLAN: 1. Chronic systolic CHF: Nonischemic cardiomyopathy.  Last echo 9/16 with EF 35-40%. Out of range for ICD. NYHA III symptoms but very limited by back and knee pain so hard to tell how limited she truly is from cardiac perspective.  - Continue Lasix 40 mg bid for now. Recheck BMET today. If weight continues to go up, can increase lasix to 80/40 mg until follow up.  - Continue spironolactone 12.5 mg daily.  She has been unable to increase to 25 mg daily as she gets nauseated. - Stop Entresto.    - Continue Coreg 25 mg BID - Reinforced the need and importance of daily weights, a low sodium diet, and fluid restriction (less than 2 L a day). Instructed to call the HF clinic if weight increases more than 3 lbs overnight or 5 lbs in a week.  2. OSA: Not using CPAP, needs appointment with sleep medicine.  3. Obesity:  - Will need to focus on diet and exercise once kidney function and volume status are better controlled. 4. ? Angina vs DOE: She is a bit unclear about her chest symptoms => DOE versus angina.  - Scheduled for lexiscan cardiolite stress test 01/24/16. 5. HLD - Total Cholesterol 295 -> LDL 221 - Start Crestor 20 mg.  Only $9 at [facility] outpatient pharmacy.  6.  AKI on CKD stage II - Med changes as above.  Following closely with BMET today and next week.  7. HTN - Having to adjust meds as above with AKI. Will eventually try and get her back on Entresto.                                                              BMET today. Stop Entresto.  Repeat labs next week at her stress test (the 2nd day, 01/24/16) and follow up PA/NP side 01/31/16.  Shirley Friar, PA-C 01/19/2016

## 2016-01-19 NOTE — Addendum Note (Signed)
Encounter addended by: Shirley Friar, PA-C on: 01/19/2016 11:20 AM<BR>     Documentation filed: Follow-up Section, LOS Section

## 2016-01-19 NOTE — Patient Instructions (Addendum)
Labs needed in one week- ok to have drawn at your stress test appointment at [facility] (02/03/16)  CONTINUE Lasix 40 mg twice a day, if your weight continues to increase then ok to increase to 80 mg twice day STOP Entresto  Your physician recommends that you schedule a follow-up appointment in: 2 weeks  Do the following things EVERYDAY: 1) Weigh yourself in the morning before breakfast. Write it down and keep it in a log. 2) Take your medicines as prescribed 3) Eat low salt foods-Limit salt (sodium) to 2000 mg per day.  4) Stay as active as you can everyday 5) Limit all fluids for the day to less than 2 liters 6)

## 2016-01-23 ENCOUNTER — Ambulatory Visit (HOSPITAL_COMMUNITY): Payer: Self-pay | Attending: Cardiology

## 2016-01-23 DIAGNOSIS — R5383 Other fatigue: Secondary | ICD-10-CM | POA: Insufficient documentation

## 2016-01-23 DIAGNOSIS — I119 Hypertensive heart disease without heart failure: Secondary | ICD-10-CM | POA: Insufficient documentation

## 2016-01-23 DIAGNOSIS — E669 Obesity, unspecified: Secondary | ICD-10-CM | POA: Insufficient documentation

## 2016-01-23 DIAGNOSIS — R079 Chest pain, unspecified: Secondary | ICD-10-CM | POA: Insufficient documentation

## 2016-01-23 MED ORDER — REGADENOSON 0.4 MG/5ML IV SOLN
0.4000 mg | Freq: Once | INTRAVENOUS | Status: AC
  Administered 2016-01-23: 0.4 mg via INTRAVENOUS

## 2016-01-23 MED ORDER — TECHNETIUM TC 99M TETROFOSMIN IV KIT
32.3000 | PACK | Freq: Once | INTRAVENOUS | Status: AC | PRN
  Administered 2016-01-23: 32.3 via INTRAVENOUS
  Filled 2016-01-23: qty 32

## 2016-01-24 ENCOUNTER — Ambulatory Visit (HOSPITAL_COMMUNITY): Payer: Self-pay | Attending: Cardiology

## 2016-01-24 LAB — MYOCARDIAL PERFUSION IMAGING
CHL CUP NUCLEAR SRS: 1
CSEPPHR: 75 {beats}/min
LV sys vol: 77 mL
LVDIAVOL: 160 mL (ref 46–106)
NUC STRESS TID: 0.96
RATE: 0.3
Rest HR: 56 {beats}/min
SDS: 3
SSS: 4

## 2016-01-24 MED ORDER — TECHNETIUM TC 99M TETROFOSMIN IV KIT
32.8000 | PACK | Freq: Once | INTRAVENOUS | Status: AC | PRN
  Administered 2016-01-24: 32.8 via INTRAVENOUS
  Filled 2016-01-24: qty 33

## 2016-01-27 ENCOUNTER — Telehealth (HOSPITAL_COMMUNITY): Payer: Self-pay

## 2016-01-27 NOTE — Telephone Encounter (Signed)
Attempted to reach patient concerning lab results, necessary med changes, and study results. No answer or call back.  Will add to apt note for this Tuesday with CHF clinic.  Notes Recorded by Effie Berkshire, RN on 01/27/2016 at 3:58 PM Multiple attempts to reach patient, will add to apt note for this Tuesday to review Notes Recorded by Larey Dresser, MD on 01/24/2016 at 4:08 PM No ischemia or infarction, please report to patient.

## 2016-01-31 ENCOUNTER — Ambulatory Visit (HOSPITAL_COMMUNITY)
Admission: RE | Admit: 2016-01-31 | Discharge: 2016-01-31 | Disposition: A | Discharge status: Home / Self Care | Payer: Self-pay | Source: Ambulatory Visit | Attending: Internal Medicine | Admitting: Internal Medicine

## 2016-01-31 VITALS — BP 126/78 | HR 59 | Wt 297.8 lb

## 2016-01-31 DIAGNOSIS — Z79899 Other long term (current) drug therapy: Secondary | ICD-10-CM | POA: Insufficient documentation

## 2016-01-31 DIAGNOSIS — G4733 Obstructive sleep apnea (adult) (pediatric): Secondary | ICD-10-CM | POA: Insufficient documentation

## 2016-01-31 DIAGNOSIS — M25562 Pain in left knee: Secondary | ICD-10-CM | POA: Insufficient documentation

## 2016-01-31 DIAGNOSIS — M25561 Pain in right knee: Secondary | ICD-10-CM | POA: Insufficient documentation

## 2016-01-31 DIAGNOSIS — I10 Essential (primary) hypertension: Secondary | ICD-10-CM

## 2016-01-31 DIAGNOSIS — Z7982 Long term (current) use of aspirin: Secondary | ICD-10-CM | POA: Insufficient documentation

## 2016-01-31 DIAGNOSIS — I428 Other cardiomyopathies: Secondary | ICD-10-CM | POA: Insufficient documentation

## 2016-01-31 DIAGNOSIS — Z8249 Family history of ischemic heart disease and other diseases of the circulatory system: Secondary | ICD-10-CM | POA: Insufficient documentation

## 2016-01-31 DIAGNOSIS — E1122 Type 2 diabetes mellitus with diabetic chronic kidney disease: Secondary | ICD-10-CM | POA: Insufficient documentation

## 2016-01-31 DIAGNOSIS — N179 Acute kidney failure, unspecified: Secondary | ICD-10-CM | POA: Insufficient documentation

## 2016-01-31 DIAGNOSIS — I5042 Chronic combined systolic (congestive) and diastolic (congestive) heart failure: Secondary | ICD-10-CM

## 2016-01-31 DIAGNOSIS — M109 Gout, unspecified: Secondary | ICD-10-CM | POA: Insufficient documentation

## 2016-01-31 DIAGNOSIS — I5022 Chronic systolic (congestive) heart failure: Secondary | ICD-10-CM | POA: Insufficient documentation

## 2016-01-31 DIAGNOSIS — E785 Hyperlipidemia, unspecified: Secondary | ICD-10-CM | POA: Insufficient documentation

## 2016-01-31 DIAGNOSIS — Z823 Family history of stroke: Secondary | ICD-10-CM | POA: Insufficient documentation

## 2016-01-31 DIAGNOSIS — Z6841 Body Mass Index (BMI) 40.0 and over, adult: Secondary | ICD-10-CM

## 2016-01-31 DIAGNOSIS — Z825 Family history of asthma and other chronic lower respiratory diseases: Secondary | ICD-10-CM | POA: Insufficient documentation

## 2016-01-31 DIAGNOSIS — F329 Major depressive disorder, single episode, unspecified: Secondary | ICD-10-CM | POA: Insufficient documentation

## 2016-01-31 DIAGNOSIS — N182 Chronic kidney disease, stage 2 (mild): Secondary | ICD-10-CM | POA: Insufficient documentation

## 2016-01-31 DIAGNOSIS — I130 Hypertensive heart and chronic kidney disease with heart failure and stage 1 through stage 4 chronic kidney disease, or unspecified chronic kidney disease: Secondary | ICD-10-CM | POA: Insufficient documentation

## 2016-01-31 LAB — BASIC METABOLIC PANEL
Anion gap: 7 (ref 5–15)
BUN: 19 mg/dL (ref 6–20)
CALCIUM: 9.4 mg/dL (ref 8.9–10.3)
CHLORIDE: 107 mmol/L (ref 101–111)
CO2: 30 mmol/L (ref 22–32)
CREATININE: 1.15 mg/dL — AB (ref 0.44–1.00)
GFR calc Af Amer: 57 mL/min — ABNORMAL LOW (ref >60)
GFR, EST NON AFRICAN AMERICAN: 49 mL/min — AB (ref >60)
Glucose, Bld: 94 mg/dL (ref 65–99)
Potassium: 4 mmol/L (ref 3.5–5.1)
SODIUM: 144 mmol/L (ref 135–145)

## 2016-01-31 NOTE — Progress Notes (Signed)
Advanced Heart Failure Medication Review by a Pharmacist  Does the patient  feel that his/her medications are working for him/her?  yes  Has the patient been experiencing any side effects to the medications prescribed?  no  Does the patient measure his/her own blood pressure or blood glucose at home?  no   Does the patient have any problems obtaining medications due to transportation or finances?   Patient states she cannot afford the victoza but that she is going to follow-up with her PCP about this.   Understanding of regimen: good Understanding of indications: good Potential of compliance: good Patient understands to avoid NSAIDs. Patient understands to avoid decongestants.   Pharmacist comments: Robin Hardin is a pleasant 69yof presenting to clinic for a follow-up appointment. She states that she has not been experiencing any medication-related side effects and is feeling well. Her SCr was increased at her last visit and she was intsructed to stop taking her entresto. She continued to take this medication anyways, but her SCr has since trended back down. No discrepancies were found with her medication regimen.   She did not have any medication-related questions today.   Time with patient: 10 min  Preparation and documentation time: 2 min  Total time: 12 min

## 2016-01-31 NOTE — Patient Instructions (Signed)
Labs today  Your physician recommends that you schedule a follow-up appointment in: 6-8 weeks with Dr. Aundra Dubin  Do the following things EVERYDAY: 1) Weigh yourself in the morning before breakfast. Write it down and keep it in a log. 2) Take your medicines as prescribed 3) Eat low salt foods-Limit salt (sodium) to 2000 mg per day.  4) Stay as active as you can everyday 5) Limit all fluids for the day to less than 2 liters 6)

## 2016-01-31 NOTE — Progress Notes (Signed)
[name], female   DOB: 03-06-1951, 65 y.o.   MRN: [id number]    Advanced Heart Failure Clinic Note   Primary Care: Dr Arcelia Jew Primary Cardiologist: Dr. Aundra Dubin  HPI:  Robin Hardin is a 65 y.o. AAF with history of morbid obesity, HTN, and diastolic HF diagnosed in 3491.  She was eventually diagnosed with systolic heart failure by v-gram (09/2012) with an EF approximately 25%.  LHC showed no significant coronary disease.  She also has OSA with CPAP.   She had stopped taking her fluid pills in Feb 2014 due to cramping and ended up being admitted with respiratory failure.  She was diuresed ~40 pounds.  Her torsemide was changed to lasix to avoid cramping.  Echo showed LVEF 25%. Discharge weight 387 lbs. Echo in 12/14 showed EF 40-45% with mild LV dilation.  Last echo in 9/16 showed EF 35-40% with grade II diastolic dysfunction.   She reports today for regular follow up. Two visits ago creatinine was elevated and told to stop entresto. She did not, but BMET was stable on recheck so told to just continue. Weight down 3 lbs. Taking lasix 80/40 mg daily and weight seems to be slowly coming down. Breathing is OK, mainly stairs only bother her. The "bad feeling" in her chest feels better.  Functional status mostly limited by back pain.  Still has occasional lightheadedness.   Labs (7/14): K 3.9 Creatinine 0.91, LDL 105 Labs (06/05/13): K 3.7 Creatinine 0.87 Pro BNP 56  Labs (1/15): K 4, creatinine 0.9 Labs (07/27/14): K 4.2 Creatinine 0.92 Hgb 11.8 HIV NR Labs (5/16): K 5, creatinine 2.57, TSH normal, HCT 36.5 Labs (7/16): K 4, creatinine 1.08, LDL 132 Labs (9/16): K 3.6, creatinine 1.17 Labs (10/16): creatinine 2.19 => 1.34 Labs (11/16): K 4, creatinine 1.0, BNP 114  Past Medical History:  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 She was hospitalized at [facility] in [place] where she had a cath showing normal coronaries but EF 30-35% on LV-gram. She  has been hospitalized two other times since then in [place] for CHF. Echo (8/10) showed EF 45-50%, mild global HK, mild LVH, grade I diastolic dysfunction, no significant valvular problems.  Echo (2/14) with EF severely decreased (poor windows so not quantified, appears about 25%).  Echo (12/14) with EF 40-45%, mild LV dilation, moderate LVH, moderate LAE.  Echo (9/16) with EF 35-40%, grade II diastolic dysfunction, mild MR.  2. HTN  3. Depression  4. Morbid obesity  5. Hyperlipidemia  6. OSA: sporadic with CPAP  7. Gout on allopurinol  8. Bilateral knee pain, probable osteoarthritis  9. Diabetes type II  10. Left lower leg cellulitis in 10/11 with normal [place] for DVT 11. CKD.   Family History:  No FH of premature CAD  asthma: mother, 2 brothers  heart disease: mother  clotting disorders: mother (stroke)   Social History:  Recently from [place] to [place].  Married with 2 adopted children.  Never smoked, no ETOH or drugs.  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   Review of Systems  All systems reviewed and negative except as per HPI.   Current Outpatient Prescriptions  Medication Sig Dispense Refill  . allopurinol (ZYLOPRIM) 300 MG tablet Take 1 tablet (300 mg total) by mouth daily. 90 tablet 1  . ALOE PO Take 1 capsule by mouth 2 (two) times daily.    Marland Kitchen aspirin EC 81 MG EC tablet Take 1 tablet (81 mg  total) by mouth daily. 30 tablet 2  . carvedilol (COREG) 25 MG tablet Take 1 tablet (25 mg total) by mouth 2 (two) times daily with a meal. 180 tablet 1  . FLUoxetine (PROZAC) 40 MG capsule Take 2 capsules (80 mg total) by mouth daily. 60 capsule 5  . furosemide (LASIX) 80 MG tablet Take 1 tab (80 mg total) every morning and 1/2 tab (40 mg total) every evening. 60 tablet 5  . HYDROcodone-acetaminophen (NORCO/VICODIN) 5-325 MG tablet Take 1 tablet by mouth every 6 (six) hours as needed for severe pain. 90 tablet 0  . Potassium Chloride ER 20 MEQ TBCR TAKE TWO TABLETS BY  MOUTH ONCE DAILY. PLEASE ADVISE PATIENT TO CALL FOR AN APPOINTMENT [phone]. 180 tablet 1  . rosuvastatin (CRESTOR) 20 MG tablet Take 1 tablet (20 mg total) by mouth daily. 30 tablet 11  . sacubitril-valsartan (ENTRESTO) 24-26 MG Take 1 tablet by mouth 2 (two) times daily. 60 tablet 10  . spironolactone (ALDACTONE) 25 MG tablet Take 0.5 tablets (12.5 mg total) by mouth daily. 90 tablet 3  . traZODone (DESYREL) 50 MG tablet Take 1 tablet (50 mg total) by mouth at bedtime. 30 tablet 5   No current facility-administered medications for this encounter.    No Known Allergies   Filed Vitals:   01/31/16 1020  BP: 126/78  Pulse: 59  Weight: 297 lb 12.8 oz (135.081 kg)  SpO2: 98%   Wt Readings from Last 3 Encounters:  01/31/16 297 lb 12.8 oz (135.081 kg)  01/19/16 300 lb 4 oz (136.193 kg)  01/11/16 293 lb (132.904 kg)     PHYSICAL EXAM: General:  Obese. Sitting in [place]. No respiratory difficulty, daughter present HEENT: normal Neck: supple. JVP difficult, but appears to be 7-8 Carotids 2+ bilat; no bruits. No thyromegaly or nodule noted. Cor: PMI nonpalpable. Regular rate & rhythm. 2/6 SEM RUSB. Lungs: CTAB, normal effort.  Abdomen: markedly obese, soft, non-tender, mildly distended, no HSM. No bruits or masses. +BS  Extremities: no cyanosis, clubbing, rash. Trace ankle edema.  Neuro: alert & oriented x 3, cranial nerves grossly intact. moves all 4 extremities w/o difficulty. Affect pleasant.  ASSESSMENT & PLAN: 1. Chronic systolic CHF: Nonischemic cardiomyopathy.  Last echo 9/16 with EF 35-40%. Out of range for ICD. NYHA III symptoms but very limited by back and knee pain so hard to tell how limited she truly is from cardiac perspective.  - Stress test 01/23/16 with improved EF to 52% and no evidence of ischemia or infarction.  - Continue Lasix 80/40 mg for now. BMET today.   - Continue spironolactone 12.5 mg daily.  She has been unable to increase to 25 mg daily as she gets nauseated. -  Continue Entresto 24/26 mg BID.  - Continue Coreg 25 mg BID - Reinforced the need and importance of daily weights, a low sodium diet, and fluid restriction (less than 2 L a day). Instructed to call the HF clinic if weight increases more than 3 lbs overnight or 5 lbs in a week.  2. OSA: Not using CPAP, needs appointment with sleep medicine.  3. Obesity:  - Will need to focus on diet and exercise once kidney function and volume status are better controlled. 4. ? Angina vs DOE: She is a bit unclear about her chest symptoms => DOE versus angina.  - Lexiscan cardiolite stress test 01/24/16 without ischemia or infarction.  5. HLD - Total Cholesterol 295 -> LDL 221 - Continue Crestor 20 mg.  Only $  9 at [facility] outpatient pharmacy.  6. AKI on CKD stage II - BMET today.  7. HTN - Stable on current meds. Will not titrate currently with recent AKI.                                                              Doing much better and stable on current regimen.  Stress test unremarkable. EF improved.  Will check BMET today with recent creatinine fluctuations.  Follow up 6-8 weeks with Dr. Aundra Dubin.  Shirley Friar, PA-C 01/31/2016   Total time spent >25 minutes, over half that discussing the above.

## 2016-02-20 ENCOUNTER — Other Ambulatory Visit: Payer: Self-pay | Admitting: Internal Medicine

## 2016-02-20 DIAGNOSIS — F32A Depression, unspecified: Secondary | ICD-10-CM

## 2016-02-20 DIAGNOSIS — I5042 Chronic combined systolic (congestive) and diastolic (congestive) heart failure: Secondary | ICD-10-CM

## 2016-02-20 DIAGNOSIS — M109 Gout, unspecified: Secondary | ICD-10-CM

## 2016-02-20 DIAGNOSIS — F329 Major depressive disorder, single episode, unspecified: Secondary | ICD-10-CM

## 2016-02-20 MED ORDER — TRAZODONE HCL 50 MG PO TABS: 50.0000 mg | ORAL_TABLET | Freq: Every day | ORAL | Status: DC

## 2016-02-20 MED ORDER — FUROSEMIDE 80 MG PO TABS: ORAL_TABLET | ORAL | Status: DC

## 2016-02-20 MED ORDER — POTASSIUM CHLORIDE ER 20 MEQ PO TBCR: EXTENDED_RELEASE_TABLET | ORAL | Status: DC

## 2016-02-20 NOTE — Telephone Encounter (Signed)
FLUoxetine (PROZAC) 40 MG capsule Potassium Chloride ER 20 MEQ TBCR allopurinol (ZYLOPRIM) 300 MG tablet traZODone (DESYREL) 50 MG tablet furosemide (LASIX) 80 MG tablet [place]

## 2016-02-23 ENCOUNTER — Other Ambulatory Visit: Payer: Self-pay | Admitting: *Deleted

## 2016-02-23 DIAGNOSIS — F32A Depression, unspecified: Secondary | ICD-10-CM

## 2016-02-23 DIAGNOSIS — F329 Major depressive disorder, single episode, unspecified: Secondary | ICD-10-CM

## 2016-02-24 MED ORDER — FLUOXETINE HCL 40 MG PO CAPS: 80.0000 mg | ORAL_CAPSULE | Freq: Every day | ORAL | Status: DC

## 2016-02-28 ENCOUNTER — Ambulatory Visit (INDEPENDENT_AMBULATORY_CARE_PROVIDER_SITE_OTHER): Payer: Self-pay | Admitting: Internal Medicine

## 2016-02-28 ENCOUNTER — Encounter: Payer: Self-pay | Admitting: Internal Medicine

## 2016-02-28 DIAGNOSIS — I5042 Chronic combined systolic (congestive) and diastolic (congestive) heart failure: Secondary | ICD-10-CM

## 2016-02-28 DIAGNOSIS — I10 Essential (primary) hypertension: Secondary | ICD-10-CM

## 2016-02-28 DIAGNOSIS — Z79891 Long term (current) use of opiate analgesic: Secondary | ICD-10-CM

## 2016-02-28 DIAGNOSIS — M25569 Pain in unspecified knee: Secondary | ICD-10-CM

## 2016-02-28 DIAGNOSIS — E669 Obesity, unspecified: Secondary | ICD-10-CM

## 2016-02-28 DIAGNOSIS — Z0000 Encounter for general adult medical examination without abnormal findings: Secondary | ICD-10-CM

## 2016-02-28 DIAGNOSIS — M545 Low back pain: Secondary | ICD-10-CM

## 2016-02-28 DIAGNOSIS — F32A Depression, unspecified: Secondary | ICD-10-CM

## 2016-02-28 DIAGNOSIS — N183 Chronic kidney disease, stage 3 unspecified: Secondary | ICD-10-CM

## 2016-02-28 DIAGNOSIS — F329 Major depressive disorder, single episode, unspecified: Secondary | ICD-10-CM

## 2016-02-28 DIAGNOSIS — Z79899 Other long term (current) drug therapy: Secondary | ICD-10-CM

## 2016-02-28 DIAGNOSIS — F1129 Opioid dependence with unspecified opioid-induced disorder: Secondary | ICD-10-CM

## 2016-02-28 DIAGNOSIS — I130 Hypertensive heart and chronic kidney disease with heart failure and stage 1 through stage 4 chronic kidney disease, or unspecified chronic kidney disease: Secondary | ICD-10-CM

## 2016-02-28 DIAGNOSIS — G8929 Other chronic pain: Secondary | ICD-10-CM

## 2016-02-28 MED ORDER — HYDROCODONE-ACETAMINOPHEN 5-325 MG PO TABS: 1.0000 | ORAL_TABLET | Freq: Four times a day (QID) | ORAL | Status: DC | PRN

## 2016-02-28 NOTE — Patient Instructions (Signed)
General Instructions: - We can consider referring you to sports medicine for your back pain once you get your Medicare - Continue Norco 2-3 times daily as needed - Continue to work on improving your diet. Increase fresh vegetable intake. Avoid canned foods, salted foods, and sweets.  Thank you for bringing your medicines today. This helps [place] keep you safe from mistakes.   Progress Toward Treatment Goals:  Treatment Goal 02/22/2015  Hemoglobin A1C at goal  Blood pressure at goal    Self Care Goals & Plans:  Self Care Goal 02/28/2016  Manage my medications take my medicines as prescribed; bring my medications to every visit; refill my medications on time  Monitor my health keep track of my blood pressure  Eat healthy foods eat more vegetables; eat foods that are low in salt; eat baked foods instead of fried foods  Be physically active find an activity I enjoy  Prevent falls -    No flowsheet data found.   Care Management & Community Referrals:  Referral 06/02/2013  Referrals made to community resources weight management; exercise/physical therapy; nutrition

## 2016-02-28 NOTE — Progress Notes (Signed)
   Subjective:    [name], female    DOB: 10-15-1950, 65 y.o.   MRN: [id number]  HPI Ms. Ashbaugh is a 65yo woman with PMHx of HTN, chronic combined CHF, and CKD stage 3 who presents today for follow up of her HTN.   HTN: BP excellent at 119/66. She takes Lasix 80 mg in AM and 40 mg in PM, Entresto 24-26 mg BID, Spironolactone 12.5 mg daily, and Coreg 25 mg BID.   Chronic Knee and Back Pain: She reports her knee pain is unchanged. She is prescribed Norco 5-325 mg Q6H PRN #90 tablets per month. She reports taking Norco 2-3 times per day, 3 tablets on days when pain is severe. This occurs about 1-2 times per week. She also describes lower back pain that is "pressure-like" and "pulling". She describes feeling the pain go from her lower back to her upper right back, especially if she turns towards her right side. She feels as if a muscle is pulling. She denies any saddle anesthesia or loss of bowel or bladder control. She states the Norco does not really help her back pain. She notes if she lays down the pain is not as bad and does not bother her at night. She is frustrated that her knee and back pain prevent her from doing household chores such as standing up to wash the dishes or even cook a meal at home. She uses a walker when taking short trips to the store and uses a wheelchair at home. She knows she needs to lose weight to help her pain.   CHF: Her most recent Myoview in June 2017 shows an improvement in her EF of 52%. She follows closely with Dr. Aundra Dubin (Cards-heart failure). Her Lasix was adjusted to 80 mg in the morning and 40 mg in the evening in relation to kidney function. Her weight is 299 lbs today. Her baseline weight should be more in the 295 lb range. She admits she could work on her diet more. She denies any chest pain. She notes she gets SOB when walking but this is unchanged from her baseline.   CKD Stage 3: Cr had increased over the past few months but is now  trending back down to her baseline, 1.0>2.0>1.39>1.15. GFR stable under 60. Denies any urinary issues.  Depression: Reports she still feels depressed at times but overall has been doing well. Her PHQ9 score today is 0. She is taking Prozac 80 mg daily.   Review of Systems General: Denies fever, chills, night sweats, changes in appetite HEENT: Denies headaches, ear pain, changes in vision, rhinorrhea, sore throat CV: Denies palpitations, orthopnea Pulm: Denies cough, wheezing GI: Denies abdominal pain, nausea, vomiting, diarrhea, constipation, melena, hematochezia GU: Denies dysuria, hematuria, frequency Msk: See HPI Neuro: Denies weakness, numbness, tingling Skin: Denies rashes, bruising Psych: Denies anxiety, hallucinations    Objective:   Physical Exam General: morbidly obese woman sitting up, NAD, pleasant  HEENT: [place]/AT, EOMI, sclera anicteric, mucus membranes moist CV: RRR, 2/6 systolic murmur at RUSB Pulm: CTA bilaterally, breaths non-labored Abd: BS+, soft, obese, non-tender Back: mild tenderness to palpation of lower back Ext: warm, no edema Neuro: alert and oriented x 3    Assessment & Plan:  Please refer to A&P documentation.

## 2016-03-02 NOTE — Assessment & Plan Note (Signed)
PHQ-9 score 0 today. Will continue Prozac 80 mg daily.

## 2016-03-02 NOTE — Assessment & Plan Note (Signed)
Given stool cards today for colon cancer screening.

## 2016-03-02 NOTE — Assessment & Plan Note (Signed)
Her pain is difficult to control as I think her obesity is driving a lot of the pain. She knows she needs to lose weight and has some room to improve her diet, but ultimately she is limited in physical activity due to her pain and obesity. We discussed ways she could improve her diet (avoiding salt, sugared beverages, sweets, fried foods). She is not interested in meeting with [facility] for nutrition education. Will continue her current regimen of Norco 5-325 mg Q6H PRN #90 tablets per month. I gave her 3 prescriptions today. Will update pain contract at next visit. Her UDS was appropriate in March this year. She asked for something to alleviate her back pain. I think the only thing that could help would be a muscle relaxer but I advised her I did not want to place her on any other sedating medications. We discussed conservative measures such as using a heating pad or Tylenol as needed. Follow up in 3 months.

## 2016-03-02 NOTE — Assessment & Plan Note (Addendum)
She has NYHA Class III symptoms but difficult to tell if this is related to her CHF or her limitations from knee/back pain. Her recent Myoview showed improvement in her EF. She denies any chest pain. Will continue her current regimen as per Dr. Aundra Dubin with heart failure.

## 2016-03-02 NOTE — Assessment & Plan Note (Signed)
Kidney function stable. Cr has continued to improve over last few months. Continue to monitor.

## 2016-03-02 NOTE — Assessment & Plan Note (Signed)
Well controlled. Continue current regimen.

## 2016-03-05 NOTE — Progress Notes (Signed)
Internal Medicine Clinic Attending  Case discussed with Dr. Rivet soon after the resident saw the patient.  We reviewed the resident's history and exam and pertinent patient test results.  I agree with the assessment, diagnosis, and plan of care documented in the resident's note.  

## 2016-03-13 ENCOUNTER — Encounter (HOSPITAL_COMMUNITY): Payer: Self-pay

## 2016-03-22 ENCOUNTER — Other Ambulatory Visit (HOSPITAL_COMMUNITY): Payer: Self-pay | Admitting: Cardiology

## 2016-03-22 DIAGNOSIS — I5042 Chronic combined systolic (congestive) and diastolic (congestive) heart failure: Secondary | ICD-10-CM

## 2016-04-03 ENCOUNTER — Encounter (HOSPITAL_COMMUNITY): Payer: Self-pay

## 2016-04-17 ENCOUNTER — Ambulatory Visit (HOSPITAL_COMMUNITY)
Admission: RE | Admit: 2016-04-17 | Discharge: 2016-04-17 | Disposition: A | Discharge status: Home / Self Care | Payer: Self-pay | Source: Ambulatory Visit | Attending: Cardiology | Admitting: Cardiology

## 2016-04-17 ENCOUNTER — Encounter (HOSPITAL_COMMUNITY): Payer: Self-pay

## 2016-04-17 VITALS — BP 118/70 | HR 64 | Wt 295.2 lb

## 2016-04-17 DIAGNOSIS — M25562 Pain in left knee: Secondary | ICD-10-CM | POA: Insufficient documentation

## 2016-04-17 DIAGNOSIS — Z79899 Other long term (current) drug therapy: Secondary | ICD-10-CM | POA: Insufficient documentation

## 2016-04-17 DIAGNOSIS — G4733 Obstructive sleep apnea (adult) (pediatric): Secondary | ICD-10-CM | POA: Insufficient documentation

## 2016-04-17 DIAGNOSIS — N179 Acute kidney failure, unspecified: Secondary | ICD-10-CM | POA: Insufficient documentation

## 2016-04-17 DIAGNOSIS — F329 Major depressive disorder, single episode, unspecified: Secondary | ICD-10-CM | POA: Insufficient documentation

## 2016-04-17 DIAGNOSIS — N182 Chronic kidney disease, stage 2 (mild): Secondary | ICD-10-CM | POA: Insufficient documentation

## 2016-04-17 DIAGNOSIS — M109 Gout, unspecified: Secondary | ICD-10-CM | POA: Insufficient documentation

## 2016-04-17 DIAGNOSIS — M25561 Pain in right knee: Secondary | ICD-10-CM | POA: Insufficient documentation

## 2016-04-17 DIAGNOSIS — R079 Chest pain, unspecified: Secondary | ICD-10-CM

## 2016-04-17 DIAGNOSIS — I5022 Chronic systolic (congestive) heart failure: Secondary | ICD-10-CM | POA: Insufficient documentation

## 2016-04-17 DIAGNOSIS — E1122 Type 2 diabetes mellitus with diabetic chronic kidney disease: Secondary | ICD-10-CM | POA: Insufficient documentation

## 2016-04-17 DIAGNOSIS — R0789 Other chest pain: Secondary | ICD-10-CM | POA: Insufficient documentation

## 2016-04-17 DIAGNOSIS — I428 Other cardiomyopathies: Secondary | ICD-10-CM | POA: Insufficient documentation

## 2016-04-17 DIAGNOSIS — Z7982 Long term (current) use of aspirin: Secondary | ICD-10-CM | POA: Insufficient documentation

## 2016-04-17 DIAGNOSIS — I5042 Chronic combined systolic (congestive) and diastolic (congestive) heart failure: Secondary | ICD-10-CM

## 2016-04-17 DIAGNOSIS — Z8249 Family history of ischemic heart disease and other diseases of the circulatory system: Secondary | ICD-10-CM | POA: Insufficient documentation

## 2016-04-17 DIAGNOSIS — I10 Essential (primary) hypertension: Secondary | ICD-10-CM

## 2016-04-17 DIAGNOSIS — Z6841 Body Mass Index (BMI) 40.0 and over, adult: Secondary | ICD-10-CM | POA: Insufficient documentation

## 2016-04-17 DIAGNOSIS — E785 Hyperlipidemia, unspecified: Secondary | ICD-10-CM

## 2016-04-17 DIAGNOSIS — Z823 Family history of stroke: Secondary | ICD-10-CM | POA: Insufficient documentation

## 2016-04-17 DIAGNOSIS — I130 Hypertensive heart and chronic kidney disease with heart failure and stage 1 through stage 4 chronic kidney disease, or unspecified chronic kidney disease: Secondary | ICD-10-CM | POA: Insufficient documentation

## 2016-04-17 DIAGNOSIS — M545 Low back pain: Secondary | ICD-10-CM | POA: Insufficient documentation

## 2016-04-17 DIAGNOSIS — Z825 Family history of asthma and other chronic lower respiratory diseases: Secondary | ICD-10-CM | POA: Insufficient documentation

## 2016-04-17 LAB — BASIC METABOLIC PANEL
ANION GAP: 10 (ref 5–15)
BUN: 19 mg/dL (ref 6–20)
CALCIUM: 9.2 mg/dL (ref 8.9–10.3)
CHLORIDE: 108 mmol/L (ref 101–111)
CO2: 24 mmol/L (ref 22–32)
Creatinine, Ser: 1.13 mg/dL — ABNORMAL HIGH (ref 0.44–1.00)
GFR calc non Af Amer: 50 mL/min — ABNORMAL LOW (ref >60)
GFR, EST AFRICAN AMERICAN: 58 mL/min — AB (ref >60)
GLUCOSE: 102 mg/dL — AB (ref 65–99)
Potassium: 4.1 mmol/L (ref 3.5–5.1)
Sodium: 142 mmol/L (ref 135–145)

## 2016-04-17 LAB — BRAIN NATRIURETIC PEPTIDE: B Natriuretic Peptide: 38.9 pg/mL (ref 0.0–100.0)

## 2016-04-17 NOTE — Patient Instructions (Signed)
Labs today  PLEASE PICK UP YOUR CRESTOR FROM PHARMACY  Your physician recommends that you return for a FASTING lipid profile: 2 months  We will contact you in 4 months to schedule your next appointment.

## 2016-04-17 NOTE — Progress Notes (Signed)
[name], female   DOB: 04-10-1951, 65 y.o.   MRN: [id number]    Advanced Heart Failure Clinic Note   Primary Care: Dr Arcelia Jew Primary Cardiologist: Dr. Aundra Dubin  HPI:  Robin Hardin is a 65 y.o. AAF with history of morbid obesity, HTN, and diastolic HF diagnosed in 6147.  She was eventually diagnosed with systolic heart failure by v-gram (09/2012) with an EF approximately 25%.  LHC showed no significant coronary disease.  She also has OSA with CPAP.   She had stopped taking her fluid pills in Feb 2014 due to cramping and ended up being admitted with respiratory failure.  She was diuresed ~40 pounds.  Her torsemide was changed to lasix to avoid cramping.  Echo showed LVEF 25%. Discharge weight 387 lbs. Echo in 12/14 showed EF 40-45% with mild LV dilation.  Last echo in 9/16 showed EF 35-40% with grade II diastolic dysfunction.  She had atypical chest pain in 6/17, Cardiolite at that time showed EF up to 52% with no ischemia/infarction.   She reports today for regular follow up. She has chronic knee and low back pain that limits ambulation.  She is short of breath going up stairs.  She has chest tightness with stress but not with exertion.  No orthopnea/PND. She has not been taking Crestor for about 2 wks.    Labs (7/14): K 3.9 Creatinine 0.91, LDL 105 Labs (06/05/13): K 3.7 Creatinine 0.87 Pro BNP 56  Labs (1/15): K 4, creatinine 0.9 Labs (07/27/14): K 4.2 Creatinine 0.92 Hgb 11.8 HIV NR Labs (5/16): K 5, creatinine 2.57, TSH normal, HCT 36.5 Labs (7/16): K 4, creatinine 1.08, LDL 132 Labs (9/16): K 3.6, creatinine 1.17 Labs (10/16): creatinine 2.19 => 1.34 Labs (11/16): K 4, creatinine 1.0, BNP 114 Labs (5/17): LDL 221, creatinine 2.07 Labs (6/17): K 4, creatinine 1.15  Past Medical History:  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 She was hospitalized at [facility] in [place] where she had a cath showing normal coronaries but EF 30-35% on  LV-gram. She has been hospitalized two other times since then in [place] for CHF. Echo (8/10) showed EF 45-50%, mild global HK, mild LVH, grade I diastolic dysfunction, no significant valvular problems.  Echo (2/14) with EF severely decreased (poor windows so not quantified, appears about 25%).  Echo (12/14) with EF 40-45%, mild LV dilation, moderate LVH, moderate LAE.  Echo (9/16) with EF 35-40%, grade II diastolic dysfunction, mild MR.  - Lexiscan Cardiolite (6/17) with EF 52%, no ischemia/infarction.  2. HTN  3. Depression  4. Morbid obesity  5. Hyperlipidemia  6. OSA: sporadic with CPAP  7. Gout on allopurinol  8. Bilateral knee pain, probable osteoarthritis  9. Diabetes type II  10. Left lower leg cellulitis in 10/11 with normal [place] for DVT 11. CKD.   Family History:  No FH of premature CAD  asthma: mother, 2 brothers  heart disease: mother  clotting disorders: mother (stroke)   Social History:  Recently from [place] to [place].  Married with 2 adopted children.  Never smoked, no ETOH or drugs.  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   Review of Systems  All systems reviewed and negative except as per HPI.   Current Outpatient Prescriptions  Medication Sig Dispense Refill  . allopurinol (ZYLOPRIM) 300 MG tablet Take 1 tablet (300 mg total) by mouth daily. 90 tablet 1  . ALOE PO Take 1 capsule by mouth 2 (two) times  daily.    . aspirin EC 81 MG EC tablet Take 1 tablet (81 mg total) by mouth daily. 30 tablet 2  . carvedilol (COREG) 25 MG tablet Take 1 tablet (25 mg total) by mouth 2 (two) times daily with a meal. 180 tablet 1  . FLUoxetine (PROZAC) 40 MG capsule Take 2 capsules (80 mg total) by mouth daily. 60 capsule 5  . furosemide (LASIX) 80 MG tablet Take 1 tab (80 mg total) every morning and 1/2 tab (40 mg total) every evening. 180 tablet 1  . HYDROcodone-acetaminophen (NORCO/VICODIN) 5-325 MG tablet Take 1 tablet by mouth every 6 (six) hours as needed for  severe pain. 90 tablet 0  . Potassium Chloride ER 20 MEQ TBCR TAKE TWO TABLETS BY MOUTH ONCE DAILY. PLEASE ADVISE PATIENT TO CALL FOR AN APPOINTMENT [phone]. 180 tablet 1  . rosuvastatin (CRESTOR) 20 MG tablet Take 1 tablet (20 mg total) by mouth daily. 30 tablet 11  . sacubitril-valsartan (ENTRESTO) 24-26 MG Take 1 tablet by mouth 2 (two) times daily. 60 tablet 10  . spironolactone (ALDACTONE) 25 MG tablet Take 0.5 tablets (12.5 mg total) by mouth daily. 90 tablet 3  . traZODone (DESYREL) 50 MG tablet Take 1 tablet (50 mg total) by mouth at bedtime. 30 tablet 5   No current facility-administered medications for this encounter.     No Known Allergies   Vitals:   04/17/16 0935  BP: 118/70  Pulse: 64  SpO2: 98%  Weight: 295 lb 4 oz (133.9 kg)   Wt Readings from Last 3 Encounters:  04/17/16 295 lb 4 oz (133.9 kg)  02/28/16 299 lb 12.8 oz (136 kg)  01/31/16 297 lb 12.8 oz (135.1 kg)     PHYSICAL EXAM: General:  Obese. Sitting in [place]. No respiratory difficulty, daughter present HEENT: normal Neck: supple. JVP difficult, but does not appear elevated. Carotids 2+ bilat; no bruits. No thyromegaly or nodule noted. Cor: PMI nonpalpable. Regular rate & rhythm. 2/6 early SEM RUSB. Lungs: CTAB, normal effort.  Abdomen: obese, soft, non-tender, mildly distended, no HSM. No bruits or masses. +BS  Extremities: no cyanosis, clubbing, rash. No edema.  Neuro: alert & oriented x 3, cranial nerves grossly intact. moves all 4 extremities w/o difficulty. Affect pleasant.  ASSESSMENT & PLAN: 1. Chronic systolic CHF: Nonischemic cardiomyopathy.  Last echo 9/16 with EF 35-40%. Out of range for ICD. Interestingly, EF was reported as 52% on 6/17 Cardiolite (no ischemia or infarction).  NYHA II-III symptoms but very limited by back and knee pain so hard to tell how limited she truly is from cardiac perspective.  - Continue Lasix 80/40 mg for now. BMET/BNP today.   - Continue spironolactone 12.5 mg daily.     - Continue Entresto 24/26 mg BID.  - Continue Coreg 25 mg BID 2. OSA: Not using CPAP, needs appointment with sleep medicine.  3. Obesity: Needs focus on diet. Not sure she will be able to increase her exercise level for orthopedic reasons.  4. Chest pain: Atypical.  - Lexiscan cardiolite stress test 01/24/16 without ischemia or infarction.  5. Hyperlipidemia: LDL very elevated in 5/17.  She started Crestor but has been out of it for 2 wks.  I will have her restart Crestor and will get lipids/LFTs in 2 months.   6. AKI on CKD stage II: Creatinine back to baseline in 6/17. Repeat today.  7. HTN: BP controlled.  Loralie Champagne 04/17/2016

## 2016-04-19 ENCOUNTER — Encounter: Payer: Self-pay | Admitting: Internal Medicine

## 2016-04-19 ENCOUNTER — Other Ambulatory Visit: Payer: Self-pay | Admitting: *Deleted

## 2016-04-19 NOTE — Telephone Encounter (Signed)
Also requesting refill on Metformin; states helps w/ weight loss. Qty #180. thanks

## 2016-04-20 MED ORDER — CARVEDILOL 25 MG PO TABS: 25.0000 mg | ORAL_TABLET | Freq: Two times a day (BID) | ORAL | 1 refills | Status: DC

## 2016-04-20 NOTE — Telephone Encounter (Signed)
Pt call - "mailbox full" unable to leave message.

## 2016-04-20 NOTE — Telephone Encounter (Signed)
Tell patient Metformin is not used for weight loss and she no longer needs to take this medication

## 2016-04-24 NOTE — Telephone Encounter (Signed)
Pt called - no answer; left message  Metformin is not used for wt loss and she is no longer to take this medication per Dr Arcelia Jew. And if she has any questions to call and schedule an appt w/her doctor.

## 2016-05-10 ENCOUNTER — Other Ambulatory Visit (HOSPITAL_COMMUNITY): Payer: Self-pay | Admitting: Cardiology

## 2016-05-10 DIAGNOSIS — I5042 Chronic combined systolic (congestive) and diastolic (congestive) heart failure: Secondary | ICD-10-CM

## 2016-05-29 ENCOUNTER — Encounter: Payer: Self-pay | Admitting: Internal Medicine

## 2016-05-29 ENCOUNTER — Ambulatory Visit (INDEPENDENT_AMBULATORY_CARE_PROVIDER_SITE_OTHER): Payer: Commercial Managed Care - HMO | Admitting: Internal Medicine

## 2016-05-29 VITALS — BP 118/78 | HR 62 | Temp 98.5°F | Wt 293.1 lb

## 2016-05-29 DIAGNOSIS — I10 Essential (primary) hypertension: Secondary | ICD-10-CM

## 2016-05-29 DIAGNOSIS — Z79891 Long term (current) use of opiate analgesic: Secondary | ICD-10-CM

## 2016-05-29 DIAGNOSIS — Z6841 Body Mass Index (BMI) 40.0 and over, adult: Secondary | ICD-10-CM

## 2016-05-29 DIAGNOSIS — M25562 Pain in left knee: Secondary | ICD-10-CM | POA: Diagnosis not present

## 2016-05-29 DIAGNOSIS — F329 Major depressive disorder, single episode, unspecified: Secondary | ICD-10-CM

## 2016-05-29 DIAGNOSIS — M25561 Pain in right knee: Secondary | ICD-10-CM

## 2016-05-29 DIAGNOSIS — M549 Dorsalgia, unspecified: Secondary | ICD-10-CM

## 2016-05-29 DIAGNOSIS — Z0000 Encounter for general adult medical examination without abnormal findings: Secondary | ICD-10-CM

## 2016-05-29 DIAGNOSIS — G8929 Other chronic pain: Secondary | ICD-10-CM

## 2016-05-29 DIAGNOSIS — Z79899 Other long term (current) drug therapy: Secondary | ICD-10-CM

## 2016-05-29 DIAGNOSIS — F1129 Opioid dependence with unspecified opioid-induced disorder: Secondary | ICD-10-CM

## 2016-05-29 DIAGNOSIS — F3289 Other specified depressive episodes: Secondary | ICD-10-CM

## 2016-05-29 MED ORDER — HYDROCODONE-ACETAMINOPHEN 5-325 MG PO TABS: 1.0000 | ORAL_TABLET | Freq: Four times a day (QID) | ORAL | 0 refills | Status: DC | PRN

## 2016-05-29 MED ORDER — SPIRONOLACTONE 25 MG PO TABS: 12.5000 mg | ORAL_TABLET | Freq: Every day | ORAL | 3 refills | Status: DC

## 2016-05-29 MED ORDER — ALLOPURINOL 300 MG PO TABS: 300.0000 mg | ORAL_TABLET | Freq: Every day | ORAL | 1 refills | Status: DC

## 2016-05-29 NOTE — Patient Instructions (Signed)
General Instructions: - Referral to Psychiatry for depression - Referral to nutrition education. This will be very important for you to start your new eating habits and lifestyle. We need to work on weight loss so that we can get you doing the things you like to do - Follow up next month for pap smear - Please remember to bring stool cards to next visit   Thank you for bringing your medicines today. This helps [place] keep you safe from mistakes.   Progress Toward Treatment Goals:  Treatment Goal 02/22/2015  Hemoglobin A1C at goal  Blood pressure at goal  Prevent falls -    Self Care Goals & Plans:  Self Care Goal 05/29/2016  Manage my medications take my medicines as prescribed; bring my medications to every visit; refill my medications on time  Monitor my health keep track of my blood pressure  Eat healthy foods eat more vegetables; eat foods that are low in salt; eat baked foods instead of fried foods  Be physically active find an activity I enjoy  Prevent falls -    No flowsheet data found.   Care Management & Community Referrals:  Referral 06/02/2013  Referrals made to community resources weight management; exercise/physical therapy; nutrition

## 2016-05-29 NOTE — Progress Notes (Signed)
Copy of Pain Contract given to pt.

## 2016-05-30 NOTE — Assessment & Plan Note (Signed)
-  Ran out of flu shots in clinic but will get at next visit - Pap smear next month - Stool cards- patient will bring to next visit

## 2016-05-30 NOTE — Assessment & Plan Note (Signed)
Her pain is moderately controlled while on Norco. She is still very limited in her activity but I feel this is multifactorial with her heart failure and depression. She would not benefit from an increase in her pain medication as this likely would make her feel more fatigued and I worry about increased sedation in combination with her OSA. We discussed weight loss for a long time today and I think this would be the most beneficial thing for her chronic pain. Will have her go to nutrition education and continue to encourage weight loss. Pain contract renewed today. The [place] substance database was reviewed and appropriate. Will continue Norco 5-325 mg Q6H PRN #90 tablets per month. I gave her 2 prescriptions today. She still had 1 prescription left over from last visit. I will discuss cutting back her tablet amount at her next visit.

## 2016-05-30 NOTE — Assessment & Plan Note (Signed)
Well controlled. Continue current regimen.

## 2016-05-30 NOTE — Progress Notes (Signed)
Internal Medicine Clinic Attending  Case discussed with Dr. Arcelia Jew soon after the resident saw the patient.  We reviewed the resident's history and exam and pertinent patient test results.  I agree with the assessment, diagnosis, and plan of care documented in the resident's note.

## 2016-05-30 NOTE — Assessment & Plan Note (Signed)
She has long-standing depression which has been difficult to control. She had been doing well until the last several months. I think a lot of her depression stems from her weight as a limiting factor in allowing her to do things that she used to enjoy such as being more involved in the church and even going shopping. I think she would benefit from psychotherapy but also seeing a psychiatrist to see if an additional agent can be added on or if she needs to be switched to something completely different. Will continue Prozac for now but refer her to Psychiatry. No SI/HI. Will follow up how her depression is doing at her next appointment.

## 2016-05-30 NOTE — Progress Notes (Signed)
CC: Depression  HPI:  [name] is a 65 y.o. woman with PMHx as noted below who presents today for follow up of her depression.  Depression: Reports her depression has worsened over the last few months. She denies any specific trigger, but notes she missed a few doses of her Prozac and was having thoughts about her one son who died a few years ago. She is also concerned about her weight and feels frustrated about how her weight fluctuates and how this prevents her from doing activities she used to enjoy. She reports her husband's health has been poor lately and she is worried about him. Her PHQ-9 score is 6 today, previously 0 at last few visits. She is currently on Prozac 80 mg daily. She reports seeing a psychologist in the past which she did find helpful. She is interested in restarting therapy. She has also seen Psychiatry in the past.   Morbid Obesity: She is frustrated by her weight and wishes there was a "magical pill" that could help her lose weight. She is under the impression that Metformin has been keeping her weight stable and is fearful once she stops this that she will gain additional weight. She tried to pick up Victoza but this medication was too expensive. She admits to eating fast food at least once per week and eating frozen dinners as well. She reports having nutrition education with Butch Penny in our clinic previously and found this helpful. However, she reports "I know what I need to do, it's just hard to actually do it." She is unable to exercise do to her obesity and chronic knee and back pain.  HTN: BP well controlled at 118/78. She is taking Coreg 25 mg BID, Lasix 80 mg in morning and 40 mg in evening, Entresto 24-26 mg BID, and Spironolactone 12.5 mg daily.   Chronic Knee and Back Pain: Reports her pain is well controlled if she is not trying to be active. If she tries to cook, walk up steps, or walk around in the grocery store she has to stop frequently both due to  shortness of breath and pain. She wishes she could return to activities that she enjoyed before. She is currently taking Norco 5-325 mg Q6H PRN and takes about 2-3 pills daily.  Past Medical History:  Diagnosis Date  . Acute respiratory failure with hypoxia ([place]) 09/26/2012  . Bilateral knee pain   . Cellulitis of lower leg    left  . Chronic combined systolic and diastolic congestive heart failure ([place]) 10/10/2012   2 D echo 09/2012:  Left ventricle: Extremely poor acoustic windows limit study. Overall LVEF appears to be severely depressed. Recomm ordering limited study with contrast to further evaluate LV systolic function. The cavity size was normal. Wall thickness was normal. Doppler parameters are consistent with abnormal left ventricular relaxation (grade 1 diastolic dysfunction).   2 D echo 03/2009 :  Left ventricle: The cavity size was normal. There was mild concentric hypertrophy. Systolic function was mildly reduced. The estimated ejection fraction was in the range of 45% to 50%. Diffuse hypokinesis. Doppler parameters are consistent with abnormal left ventricular relaxation (grade 1 diastolic dysfunction).     . Chronic kidney disease 10/10/2012   Stage 2 with GFR of 64   . Depression   . Diabetes mellitus   . Gout   . H/O: hysterectomy   . Hyperlipidemia   . Hypertension   . Malignant hypertension 09/26/2012  . Morbid obesity ([place])   .  Nonischemic cardiomyopathy ([place])   . Sleep apnea    on cpap  . Unspecified essential hypertension 03/22/2009   Qualifier: Diagnosis of  By: Karrie Meres, RN, BSN, [name]      Review of Systems:  All negative except per HPI  Physical Exam:  Vitals:   05/29/16 1433  BP: 118/78  Pulse: 62  Temp: 98.5 F (36.9 C)  TempSrc: Oral  SpO2: 97%  Weight: 293 lb 1.6 oz (132.9 kg)   General: morbidly obese woman sitting up, pleasant, NAD HEENT: [place]/AT, EOMI, sclera anicteric, mucus membranes moist CV: RRR, no m/g/r Pulm: CTA bilaterally, breaths  non-labored Abd: BS+, soft, obese, non-tender Ext: warm, no edema, no tenderness to palpation of knees Neuro: alert and oriented x 3  Assessment & Plan:   See Encounters Tab for problem based charting.  Patient discussed with Dr. Dareen Piano

## 2016-05-30 NOTE — Assessment & Plan Note (Signed)
Her weight is actually the lowest it is has ever been. She has lost 23 lbs in the last year. The patient admitted to dietary indiscretion so possible that this mostly fluid related weight loss. She still has significant room to go in regards to her weight loss. We discussed extensively how diet is really the only route she has as she is limited in exercise capacity and likely not a candidate for surgery at this time. I emphasized to her that Metformin is not keeping her weight in check and this medication is not used for weight loss. I will not prescribe this to her anymore as she is no longer diabetic. We discussed at length dietary changes that she can make and the importance of watching sodium and fluid intake with her heart failure. She has agreed to meet with Butch Penny again for nutrition education. Based on our discussion, I think it would be helpful for her to get meal planning ideas. Her daughter does most of the cooking at home and when her daughter does not want to cook they tend to go out to eat. I emphasized trying to eat home cooked meals as much as possible to eat the healthiest. Hopefully if she is able to lose additional weight this will not only improve her chronic pain but also her depression.

## 2016-06-01 ENCOUNTER — Telehealth: Payer: Self-pay | Admitting: Licensed Clinical Social Worker

## 2016-06-01 NOTE — Telephone Encounter (Signed)
Robin Hardin referred to CSW for behavioral health referral for both psychiatry and psychotherapy.  Robin Hardin has [facility] Medicare.  CSW utilized [employer] to obtain [employer] which offer both services.   [facility] and [facility] both network providers.  CSW placed call to Robin Hardin.  Pt voicemail not set up, unable to leave message.

## 2016-06-05 NOTE — Telephone Encounter (Signed)
CSW received voicemail from Ms. Alire stating she was returning call to this number.    CSW placed called to pt.  Unable to leave a message as pt's voice mail is not set up.

## 2016-06-06 NOTE — Telephone Encounter (Signed)
CSW unable to reach Robin Hardin by telephone, pt does not have voice mail set up.  CSW will mail information regarding network providers.  Letter sent.

## 2016-06-11 ENCOUNTER — Ambulatory Visit (HOSPITAL_COMMUNITY)
Admission: RE | Admit: 2016-06-11 | Discharge: 2016-06-11 | Disposition: A | Discharge status: Home / Self Care | Payer: Commercial Managed Care - HMO | Source: Ambulatory Visit | Attending: Cardiology | Admitting: Cardiology

## 2016-06-11 ENCOUNTER — Encounter: Payer: Self-pay | Admitting: Internal Medicine

## 2016-06-11 DIAGNOSIS — I5023 Acute on chronic systolic (congestive) heart failure: Secondary | ICD-10-CM | POA: Diagnosis not present

## 2016-06-11 DIAGNOSIS — E785 Hyperlipidemia, unspecified: Secondary | ICD-10-CM | POA: Insufficient documentation

## 2016-06-11 LAB — LIPID PANEL
CHOLESTEROL: 196 mg/dL (ref 0–200)
HDL: 58 mg/dL (ref >40)
LDL Cholesterol: 122 mg/dL — ABNORMAL HIGH (ref 0–99)
Total CHOL/HDL Ratio: 3.4 RATIO
Triglycerides: 81 mg/dL (ref <150)
VLDL: 16 mg/dL (ref 0–40)

## 2016-06-11 LAB — HEPATIC FUNCTION PANEL
ALK PHOS: 59 U/L (ref 38–126)
ALT: 33 U/L (ref 14–54)
AST: 25 U/L (ref 15–41)
Albumin: 3.9 g/dL (ref 3.5–5.0)
BILIRUBIN TOTAL: 0.4 mg/dL (ref 0.3–1.2)
Bilirubin, Direct: <0.1 mg/dL — ABNORMAL LOW (ref 0.1–0.5)
Total Protein: 6.4 g/dL — ABNORMAL LOW (ref 6.5–8.1)

## 2016-07-03 ENCOUNTER — Ambulatory Visit (INDEPENDENT_AMBULATORY_CARE_PROVIDER_SITE_OTHER): Payer: Commercial Managed Care - HMO | Admitting: Dietician

## 2016-07-03 DIAGNOSIS — Z713 Dietary counseling and surveillance: Secondary | ICD-10-CM

## 2016-07-03 DIAGNOSIS — N183 Chronic kidney disease, stage 3 unspecified: Secondary | ICD-10-CM

## 2016-07-03 DIAGNOSIS — Z6841 Body Mass Index (BMI) 40.0 and over, adult: Secondary | ICD-10-CM

## 2016-07-03 NOTE — Patient Instructions (Addendum)
  You need [phone] Calories/day to lose 1 lb per week.  PROTEIN is important to keep [place] strong as we age and when trying to decrease weight.   You need about 65 grams a day to 75 grams a day of protein.  I recommend that you eat a small or snack- that contains a vegetable and a protein food.Beans count as both!   You are doing well eating fruit keep it up.  Best to use low fat cooking methods like grilling, baking, boiling, broiling most of the time.   Read labels for sodium- try to limit to 500 mg per meal or <100 mg per serving of food.   Please make a follow up in 2-3 weeks.

## 2016-07-03 NOTE — Progress Notes (Addendum)
  Medical Nutrition Therapy:  Appt start time: 1410 end time:  1500. Visit # 1  Assessment:  Primary concerns today: weight loss.  Ms Raether reports having lost 100# and would like to decrease her weight by 50# more. She has chronic kidney disease and weighs daily and watches her salt intake for her heart failure. She stays in her bed most of the day watching tv, reading her bible or on the computer. Her daughter cooks and shops for she and her husband.  Her activity is limited by knee and back pain Preferred Learning Style: reads on computer, books Learning Readiness:Contemplating  Dental- last visit was ~ 6 months ago-she has problems but cannot afford to get them fixed.  ANTHROPOMETRICS: weight-295.9#, height-63", BMI-52- obese class III WEIGHT HISTORY ADULT: Highest: 417# Lowest-115# SLEEP:not well, best 4am- 10 am Food Intolerances: No Nausea: with pills at times Vomiting: No Diarrhea: No Constipation: yes- natures way herbal products Any hair loss: No Dining Out (times/week): 0 MEDICATIONS: FeSo4, KCL, lasix and spironolactone PHYSICAL ACTIVITY:: reading, computer, watches TV, does laundry, cares for husband DIETARY INTAKE: Usual eating pattern includes 2 meals and 2 snacks per day. Everyday foods include fruit, cereal and milk.   24-hr recall: arises 10 am B (11 AM): right size milk shake smoothie or fat free milk or 1%, cereal- soup cup honey nut cheerios, water Snk ( AM): apples, grapes, strawberries L ( 230 - 3 PM): un saltines crackers, fruit,  D ( 6-7 PM):chicken breast- legs, thigh, vegetables- greens, stove top stiffing spaghetti, fish and chips Snk (PM): almonds, sweets on weekends- cupcakes Beverages: lemon water,1-2 Fanta orange soda, orange juice- sips on it throughout day  Estimated daily energy needs: 1700-1800 calories for gradual weight loss 65-80 g protein/day  Progress Towards Goal(s):  In progress.   Nutritional Diagnosis:  NI-5.7.1 Inadequate  protein intake As related to skipping meals and food choices.  As evidenced by her 24 hours recall and reported intake.    Intervention:  Nutrition education  and counseling about lower ing sodium and increasing nutrient density, balanced meals, importance of protein and activity as we age and increasing plant protein rather than animal protein for her kidneys.  Coordination of care:   Teaching Method Utilized: Visual, Auditory and Hands on  Handouts given during visit include: plate method showing what a balanced meal looks like Barriers to learning/adherence to lifestyle change: competing values Demonstrated degree of understanding via:  Teach Back   Monitoring/Evaluation:  Dietary intake, exercise, and body weight in 3 week(s). Tashana Haberl, Butch Penny, [place] 07/03/2016 .4:57 PM

## 2016-07-23 ENCOUNTER — Other Ambulatory Visit (HOSPITAL_COMMUNITY): Payer: Self-pay | Admitting: Cardiology

## 2016-07-23 DIAGNOSIS — I5042 Chronic combined systolic (congestive) and diastolic (congestive) heart failure: Secondary | ICD-10-CM

## 2016-07-24 ENCOUNTER — Telehealth: Payer: Self-pay | Admitting: Internal Medicine

## 2016-07-24 NOTE — Telephone Encounter (Signed)
APT. REMINDER CALL, LMTCB

## 2016-07-25 ENCOUNTER — Encounter: Payer: Commercial Managed Care - HMO | Admitting: Dietician

## 2016-08-06 NOTE — Progress Notes (Signed)
   CC: Pap smear  HPI:  [name] is a 65 y.o. woman with PMHx as noted below who presents today for a pap smear.  Pap smear: Denies any hx of prior abnormal pap smears. Denies any vaginal discharge, pruritis, or bleeding.   Combined CHF: Reports a several day hx of worsening shortness of breath with exertion and chest pressure. She notes her weight is up 8-10 lbs. She weighed 295 lbs at her last visit and today is 302 lbs. She also describes associated fatigue, leg swelling, and her stomach feeling full. Denies orthopnea, lightheadedness, or dizziness. She has been taking Lasix 80 mg in AM and 40 mg in PM.  Past Medical History:  Diagnosis Date  . Acute respiratory failure with hypoxia ([name]) 09/26/2012  . Bilateral knee pain   . Cellulitis of lower leg    left  . Chronic combined systolic and diastolic congestive heart failure ([place]) 10/10/2012   2 D echo 09/2012:  Left ventricle: Extremely poor acoustic windows limit study. Overall LVEF appears to be severely depressed. Recomm ordering limited study with contrast to further evaluate LV systolic function. The cavity size was normal. Wall thickness was normal. Doppler parameters are consistent with abnormal left ventricular relaxation (grade 1 diastolic dysfunction).   2 D echo 03/2009 :  Left ventricle: The cavity size was normal. There was mild concentric hypertrophy. Systolic function was mildly reduced. The estimated ejection fraction was in the range of 45% to 50%. Diffuse hypokinesis. Doppler parameters are consistent with abnormal left ventricular relaxation (grade 1 diastolic dysfunction).     . Chronic kidney disease 10/10/2012   Stage 2 with GFR of 64   . Depression   . Diabetes mellitus   . Gout   . H/O: hysterectomy   . Hyperlipidemia   . Hypertension   . Malignant hypertension 09/26/2012  . Morbid obesity ([place])   . Nonischemic cardiomyopathy ([place])   . Sleep apnea    on cpap  . Unspecified essential hypertension 03/22/2009     Qualifier: Diagnosis of  By: Karrie Meres, RN, BSN, [name]      Review of Systems:  All negative except per HPI  Physical Exam:  Vitals:   08/07/16 1436  BP: (!) 98/58  Pulse: 64  Temp: 98.6 F (37 C)  TempSrc: Oral  SpO2: 98%  Weight: (!) 302 lb 9.6 oz (137.3 kg)  Height: _0  (1.6 m)   Repeat BP 110/76  General: alert, sitting up, NAD HEENT: [place]/AT, EOMI, sclera anicteric, mucus membranes moist CV: RRR, no m/g/r Pulm: No crackles appreciated, breaths non-labored on room air Abd: BS+, soft, obese, non-tender GU: External genitalia appear normal. Normal appearing cervix.  Ext: warm, 1+ pitting edema bilaterally Neuro: alert and oriented x 3  Assessment & Plan:   See Encounters Tab for problem based charting.  Patient discussed with Dr. Dareen Piano

## 2016-08-07 ENCOUNTER — Encounter: Payer: Self-pay | Admitting: Internal Medicine

## 2016-08-07 ENCOUNTER — Other Ambulatory Visit (HOSPITAL_COMMUNITY)
Admission: RE | Admit: 2016-08-07 | Discharge: 2016-08-07 | Disposition: A | Discharge status: Home / Self Care | Payer: Commercial Managed Care - HMO | Source: Ambulatory Visit | Attending: Internal Medicine | Admitting: Internal Medicine

## 2016-08-07 ENCOUNTER — Ambulatory Visit (HOSPITAL_COMMUNITY)
Admission: RE | Admit: 2016-08-07 | Discharge: 2016-08-07 | Disposition: A | Discharge status: Home / Self Care | Payer: Commercial Managed Care - HMO | Source: Ambulatory Visit | Attending: Family Medicine | Admitting: Family Medicine

## 2016-08-07 ENCOUNTER — Ambulatory Visit (INDEPENDENT_AMBULATORY_CARE_PROVIDER_SITE_OTHER): Payer: Commercial Managed Care - HMO | Admitting: Internal Medicine

## 2016-08-07 VITALS — BP 110/76 | HR 62 | Temp 98.6°F | Ht 63.0 in | Wt 302.6 lb

## 2016-08-07 DIAGNOSIS — Z23 Encounter for immunization: Secondary | ICD-10-CM | POA: Diagnosis not present

## 2016-08-07 DIAGNOSIS — I5042 Chronic combined systolic (congestive) and diastolic (congestive) heart failure: Secondary | ICD-10-CM

## 2016-08-07 DIAGNOSIS — Z01419 Encounter for gynecological examination (general) (routine) without abnormal findings: Secondary | ICD-10-CM

## 2016-08-07 DIAGNOSIS — Z0000 Encounter for general adult medical examination without abnormal findings: Secondary | ICD-10-CM

## 2016-08-07 DIAGNOSIS — Z79899 Other long term (current) drug therapy: Secondary | ICD-10-CM | POA: Diagnosis not present

## 2016-08-07 MED ORDER — POTASSIUM CHLORIDE ER 20 MEQ PO TBCR: EXTENDED_RELEASE_TABLET | ORAL | 1 refills | Status: DC

## 2016-08-07 MED ORDER — FUROSEMIDE 80 MG PO TABS: ORAL_TABLET | ORAL | 1 refills | Status: DC

## 2016-08-07 NOTE — Assessment & Plan Note (Addendum)
Patient with several day hx progressively worsening shortness of breath, chest pressure, leg swelling, and 8 lb weight gain consistent with volume overload due to CHF. She is stable on exam with no crackles at her lung bases and no JVD. She does have 1+ edema in her legs. I checked an EKG in office which shows NSR, no ST or T wave changes, unchanged from prior. Initially her BP was low at 98/58, but then improved to 110/76 on repeat. Advised her to increase Lasix to 120 mg twice daily. Will check bmet today as well. I refilled both her Lasix and potassium today. She has agreed to return to clinic on Friday so we can reassess her volume status.

## 2016-08-07 NOTE — Patient Instructions (Signed)
General Instructions: - Increase Lasix to 120 mg twice daily - Return to clinic on Friday so we can reassess your weight and volume status - I will call you if I hear any changes from Dr. Aundra Dubin  Please bring your medicines with you each time you come to clinic.  Medicines may include prescription medications, over-the-counter medications, herbal remedies, eye drops, vitamins, or other pills.   Progress Toward Treatment Goals:  Treatment Goal 02/22/2015  Hemoglobin A1C at goal  Blood pressure at goal  Prevent falls -    Self Care Goals & Plans:  Self Care Goal 05/29/2016  Manage my medications take my medicines as prescribed; bring my medications to every visit; refill my medications on time  Monitor my health keep track of my blood pressure  Eat healthy foods eat more vegetables; eat foods that are low in salt; eat baked foods instead of fried foods  Be physically active find an activity I enjoy  Prevent falls -    No flowsheet data found.   Care Management & Community Referrals:  Referral 06/02/2013  Referrals made to community resources weight management; exercise/physical therapy; nutrition

## 2016-08-07 NOTE — Assessment & Plan Note (Addendum)
Pap smear today. Will f/u cytology.  Flu shot today

## 2016-08-08 ENCOUNTER — Telehealth: Payer: Self-pay | Admitting: Internal Medicine

## 2016-08-08 LAB — BMP8+ANION GAP
ANION GAP: 18 mmol/L (ref 10.0–18.0)
BUN/Creatinine Ratio: 22 (ref 12–28)
BUN: 27 mg/dL (ref 8–27)
CALCIUM: 9.3 mg/dL (ref 8.7–10.3)
CO2: 23 mmol/L (ref 18–29)
CREATININE: 1.22 mg/dL — AB (ref 0.57–1.00)
Chloride: 105 mmol/L (ref 96–106)
GFR, EST AFRICAN AMERICAN: 54 mL/min/{1.73_m2} — AB (ref >59)
GFR, EST NON AFRICAN AMERICAN: 47 mL/min/{1.73_m2} — AB (ref >59)
Glucose: 96 mg/dL (ref 65–99)
POTASSIUM: 4.6 mmol/L (ref 3.5–5.2)
Sodium: 146 mmol/L — ABNORMAL HIGH (ref 134–144)

## 2016-08-08 NOTE — Progress Notes (Signed)
Internal Medicine Clinic Attending  Case discussed with Dr. Rivet at the time of the visit.  We reviewed the resident's history and exam and pertinent patient test results.  I agree with the assessment, diagnosis, and plan of care documented in the resident's note.  

## 2016-08-08 NOTE — Telephone Encounter (Signed)
Informed patient of lab results. She reports symptoms improving on Lasix 120 mg BID. Recommended to continue this dose until her appointment on Friday.

## 2016-08-09 LAB — CYTOLOGY - PAP: Diagnosis: NEGATIVE

## 2016-08-10 ENCOUNTER — Ambulatory Visit (INDEPENDENT_AMBULATORY_CARE_PROVIDER_SITE_OTHER): Payer: Commercial Managed Care - HMO | Admitting: Internal Medicine

## 2016-08-10 VITALS — BP 93/61 | HR 56 | Temp 98.3°F | Ht 63.0 in | Wt 293.9 lb

## 2016-08-10 DIAGNOSIS — I5042 Chronic combined systolic (congestive) and diastolic (congestive) heart failure: Secondary | ICD-10-CM

## 2016-08-10 DIAGNOSIS — Z79899 Other long term (current) drug therapy: Secondary | ICD-10-CM

## 2016-08-10 NOTE — Assessment & Plan Note (Signed)
Patient seen on 12/19 and noted to be volume overloaded with 8 lb weight gain (302 lbs) and progressive shortness of breath. Her home Lasix 80 mg AM and 40 mg PM was increased to 120 mg BID. She has made this change and weight today is 203 lbs. She says she is feeling much better with some occasional shortness of breath. She reports normally being able to lay flat at night, but needs to keep her head slightly elevated. She normally gets around in a wheelchair. She checks her weights daily and keeps a log on her phone.  Patient symptomatically feels much better. Weight is improved and I do not appreciate any pitting edema of her legs. Lungs are clear without crackles. Will decrease her Lasix to 80 mg BID and have her follow up in about 1 week to reassess her volume status and repeat a BMET. She understands and agrees to the plan.

## 2016-08-10 NOTE — Progress Notes (Signed)
   CC: CHF  HPI:  [name] is a 65 y.o. female with PMH as listed below who presents for follow up management of her CHF.  Combined CHF: Patient seen on 12/19 and noted to be volume overloaded with 8 lb weight gain (302 lbs) and progressive shortness of breath. Her home Lasix 80 mg AM and 40 mg PM was increased to 120 mg BID. She has made this change and weight today is 203 lbs. She says she is feeling much better with some occasional shortness of breath. She reports normally being able to lay flat at night, but needs to keep her head slightly elevated. She normally gets around in a wheelchair. She checks her weights daily and keeps a log on her phone.   Past Medical History:  Diagnosis Date  . Acute respiratory failure with hypoxia ([place]) 09/26/2012  . Bilateral knee pain   . Cellulitis of lower leg    left  . Chronic combined systolic and diastolic congestive heart failure ([place]) 10/10/2012   2 D echo 09/2012:  Left ventricle: Extremely poor acoustic windows limit study. Overall LVEF appears to be severely depressed. Recomm ordering limited study with contrast to further evaluate LV systolic function. The cavity size was normal. Wall thickness was normal. Doppler parameters are consistent with abnormal left ventricular relaxation (grade 1 diastolic dysfunction).   2 D echo 03/2009 :  Left ventricle: The cavity size was normal. There was mild concentric hypertrophy. Systolic function was mildly reduced. The estimated ejection fraction was in the range of 45% to 50%. Diffuse hypokinesis. Doppler parameters are consistent with abnormal left ventricular relaxation (grade 1 diastolic dysfunction).     . Chronic kidney disease 10/10/2012   Stage 2 with GFR of 64   . Depression   . Diabetes mellitus   . Gout   . H/O: hysterectomy   . Hyperlipidemia   . Hypertension   . Malignant hypertension 09/26/2012  . Morbid obesity ([place])   . Nonischemic cardiomyopathy ([place])   . Sleep apnea    on cpap    . Unspecified essential hypertension 03/22/2009   Qualifier: Diagnosis of  By: Karrie Meres, RN, BSN, [name]      Review of Systems:   Review of Systems  Constitutional: Positive for weight loss.  Respiratory: Negative for shortness of breath.   Cardiovascular: Positive for orthopnea. Negative for chest pain, leg swelling and PND.  Neurological: Negative for dizziness and loss of consciousness.     Physical Exam:  Vitals:   08/10/16 1643  BP: 93/61  Pulse: (!) 56  Temp: 98.3 F (36.8 C)  TempSrc: Oral  SpO2: 98%  Weight: 293 lb 14.4 oz (133.3 kg)  Height: _0  (1.6 m)   Physical Exam  Constitutional: She is oriented to person, place, and time.  Obese woman, no acute distress  HENT:  Head: Normocephalic and atraumatic.  Cardiovascular: Regular rhythm.   Slight bradycardia  Pulmonary/Chest: Effort normal. No respiratory distress. She has no wheezes. She has no rales.  Musculoskeletal: She exhibits no edema.  No appreciable pitting edema b/l lower extremities  Neurological: She is alert and oriented to person, place, and time.    Assessment & Plan:   See Encounters Tab for problem based charting.  Patient discussed with Dr. Daryll Drown

## 2016-08-10 NOTE — Patient Instructions (Addendum)
It was a pleasure to meet you Robin Hardin.  I am glad you are feeling better.  We will change your Furosemide (Lasix) to 80 mg twice a day. Take 1 tablet in the morning and 1 tablet in the evening.  Please continue to check your weights everyday.  Follow up with [place] in 1 week to recheck your weight and blood work.

## 2016-08-15 NOTE — Progress Notes (Signed)
Internal Medicine Clinic Attending  Case discussed with Dr. Zada Finders soon after the resident saw the patient.  We reviewed the resident's history and exam and pertinent patient test results.  I agree with the assessment, diagnosis, and plan of care documented in the resident's note.

## 2016-09-03 ENCOUNTER — Other Ambulatory Visit (HOSPITAL_COMMUNITY): Payer: Self-pay | Admitting: *Deleted

## 2016-09-03 MED ORDER — SACUBITRIL-VALSARTAN 24-26 MG PO TABS: 1.0000 | ORAL_TABLET | Freq: Two times a day (BID) | ORAL | 3 refills | Status: DC

## 2016-09-11 ENCOUNTER — Other Ambulatory Visit: Payer: Self-pay | Admitting: Internal Medicine

## 2016-09-12 ENCOUNTER — Telehealth: Payer: Self-pay | Admitting: Internal Medicine

## 2016-09-12 DIAGNOSIS — F1129 Opioid dependence with unspecified opioid-induced disorder: Secondary | ICD-10-CM

## 2016-09-12 NOTE — Telephone Encounter (Signed)
Pain med refill

## 2016-09-13 MED ORDER — POTASSIUM CHLORIDE ER 20 MEQ PO TBCR: EXTENDED_RELEASE_TABLET | ORAL | 1 refills | Status: DC

## 2016-09-13 MED ORDER — HYDROCODONE-ACETAMINOPHEN 5-325 MG PO TABS: 1.0000 | ORAL_TABLET | Freq: Four times a day (QID) | ORAL | 0 refills | Status: DC | PRN

## 2016-09-13 NOTE — Telephone Encounter (Signed)
Thank you, Dr. Dareen Piano- I was going to get to it today. She has an appointment with me next month so will get her other refills then.

## 2016-09-19 ENCOUNTER — Other Ambulatory Visit: Payer: Self-pay | Admitting: *Deleted

## 2016-09-19 DIAGNOSIS — F329 Major depressive disorder, single episode, unspecified: Secondary | ICD-10-CM

## 2016-09-19 DIAGNOSIS — I5042 Chronic combined systolic (congestive) and diastolic (congestive) heart failure: Secondary | ICD-10-CM

## 2016-09-19 DIAGNOSIS — I10 Essential (primary) hypertension: Secondary | ICD-10-CM

## 2016-09-19 DIAGNOSIS — F32A Depression, unspecified: Secondary | ICD-10-CM

## 2016-09-19 NOTE — Telephone Encounter (Addendum)
Pt now using mail order pharmacy.Despina Hidden Cassady1/31/20183:01 PM

## 2016-09-20 MED ORDER — FUROSEMIDE 80 MG PO TABS: ORAL_TABLET | ORAL | 1 refills | Status: DC

## 2016-09-20 MED ORDER — POTASSIUM CHLORIDE ER 20 MEQ PO TBCR
EXTENDED_RELEASE_TABLET | ORAL | 1 refills | Status: DC
Start: 2016-09-20 — End: 2016-11-28

## 2016-09-20 MED ORDER — CARVEDILOL 25 MG PO TABS: 25.0000 mg | ORAL_TABLET | Freq: Two times a day (BID) | ORAL | 1 refills | Status: DC

## 2016-09-20 MED ORDER — FLUOXETINE HCL 40 MG PO CAPS: 80.0000 mg | ORAL_CAPSULE | Freq: Every day | ORAL | 5 refills | Status: DC

## 2016-09-20 MED ORDER — SPIRONOLACTONE 25 MG PO TABS: 12.5000 mg | ORAL_TABLET | Freq: Every day | ORAL | 3 refills | Status: DC

## 2016-09-20 MED ORDER — TRAZODONE HCL 50 MG PO TABS: 50.0000 mg | ORAL_TABLET | Freq: Every day | ORAL | 5 refills | Status: DC

## 2016-09-20 MED ORDER — ALLOPURINOL 300 MG PO TABS: 300.0000 mg | ORAL_TABLET | Freq: Every day | ORAL | 1 refills | Status: DC

## 2016-09-20 MED ORDER — ROSUVASTATIN CALCIUM 20 MG PO TABS: 20.0000 mg | ORAL_TABLET | Freq: Every day | ORAL | 11 refills | Status: DC

## 2016-09-25 ENCOUNTER — Encounter: Payer: Self-pay | Admitting: Internal Medicine

## 2016-09-25 ENCOUNTER — Ambulatory Visit (INDEPENDENT_AMBULATORY_CARE_PROVIDER_SITE_OTHER): Payer: Medicare HMO | Admitting: Internal Medicine

## 2016-09-25 VITALS — BP 85/48 | HR 62 | Temp 98.1°F | Wt 298.2 lb

## 2016-09-25 DIAGNOSIS — I110 Hypertensive heart disease with heart failure: Secondary | ICD-10-CM | POA: Diagnosis not present

## 2016-09-25 DIAGNOSIS — F1129 Opioid dependence with unspecified opioid-induced disorder: Secondary | ICD-10-CM | POA: Diagnosis not present

## 2016-09-25 DIAGNOSIS — M545 Low back pain: Secondary | ICD-10-CM

## 2016-09-25 DIAGNOSIS — I5042 Chronic combined systolic (congestive) and diastolic (congestive) heart failure: Secondary | ICD-10-CM | POA: Diagnosis not present

## 2016-09-25 DIAGNOSIS — Z79891 Long term (current) use of opiate analgesic: Secondary | ICD-10-CM

## 2016-09-25 DIAGNOSIS — F329 Major depressive disorder, single episode, unspecified: Secondary | ICD-10-CM | POA: Diagnosis not present

## 2016-09-25 DIAGNOSIS — E2839 Other primary ovarian failure: Secondary | ICD-10-CM

## 2016-09-25 DIAGNOSIS — Z79899 Other long term (current) drug therapy: Secondary | ICD-10-CM | POA: Diagnosis not present

## 2016-09-25 DIAGNOSIS — I10 Essential (primary) hypertension: Secondary | ICD-10-CM

## 2016-09-25 DIAGNOSIS — L600 Ingrowing nail: Secondary | ICD-10-CM | POA: Diagnosis not present

## 2016-09-25 DIAGNOSIS — G4733 Obstructive sleep apnea (adult) (pediatric): Secondary | ICD-10-CM | POA: Diagnosis not present

## 2016-09-25 DIAGNOSIS — M170 Bilateral primary osteoarthritis of knee: Secondary | ICD-10-CM

## 2016-09-25 DIAGNOSIS — F3289 Other specified depressive episodes: Secondary | ICD-10-CM

## 2016-09-25 DIAGNOSIS — G8929 Other chronic pain: Secondary | ICD-10-CM

## 2016-09-25 DIAGNOSIS — Z0000 Encounter for general adult medical examination without abnormal findings: Secondary | ICD-10-CM

## 2016-09-25 DIAGNOSIS — Z6841 Body Mass Index (BMI) 40.0 and over, adult: Secondary | ICD-10-CM

## 2016-09-25 MED ORDER — HYDROCODONE-ACETAMINOPHEN 5-325 MG PO TABS: 1.0000 | ORAL_TABLET | Freq: Four times a day (QID) | ORAL | 0 refills | Status: DC | PRN

## 2016-09-25 MED ORDER — FUROSEMIDE 80 MG PO TABS: 80.0000 mg | ORAL_TABLET | Freq: Two times a day (BID) | ORAL | 1 refills | Status: DC

## 2016-09-25 NOTE — Patient Instructions (Addendum)
General Instructions: - Continue Lasix 80 mg twice daily. I would like for you to follow up in 1 week with the acute care clinic to see if your Lasix needs to be adjusted. Also please make appointment with Dr. Claris Gladden office - Please bring stool cards to next visit - Referrals made to podiatry and psychiatry. Await their calls. - Follow up in 2 months - DEXA scan will be scheduled - We will work on the wheelchair and CPAP machine  Please bring your medicines with you each time you come to clinic.  Medicines may include prescription medications, over-the-counter medications, herbal remedies, eye drops, vitamins, or other pills.   Progress Toward Treatment Goals:  Treatment Goal 02/22/2015  Hemoglobin A1C at goal  Blood pressure at goal  Prevent falls -    Self Care Goals & Plans:  Self Care Goal 05/29/2016  Manage my medications take my medicines as prescribed; bring my medications to every visit; refill my medications on time  Monitor my health keep track of my blood pressure  Eat healthy foods eat more vegetables; eat foods that are low in salt; eat baked foods instead of fried foods  Be physically active find an activity I enjoy  Prevent falls -    No flowsheet data found.   Care Management & Community Referrals:  Referral 06/02/2013  Referrals made to community resources weight management; exercise/physical therapy; nutrition

## 2016-09-25 NOTE — Progress Notes (Signed)
CC: Hypertension   HPI:  [name] is a 66 y.o. woman with PMHx as noted below who presents today for follow up of her hypertension.  HTN: BP 85/48 today (wrist measurement). She is taking Coreg 25 mg BID, Lasix 80 mg BID, Entresto 24-26 mg BID, and Spironolactone 12.5 mg daily. She admits she ran out of her Lasix 3 days ago as she has been taking Lasix 80 mg BID since she had a mild CHF exacerbation in December. She denies any dizziness or lightheadedness.   Combined CHF: In December she had a mild CHF exacerbation with a 8 lb weight gain, chest pressure, and orthopnea. Her Lasix was increased to 120 mg BID and at follow up 1 week later her weight was back to baseline and Lasix decreased to 80 mg BID. She was supposed to follow up again 1 week later but never did. She reports she is still taking Lasix 80 mg BID, however she ran out of Lasix tablets early due to the increased dosage. She ran out 3 days ago and now her weight is up 5 lbs and she is experiencing orthopnea, chest pressure, and abdominal swelling.   OSA: Reports she has not been using her CPAP machine because she needs a new face mask and tubing. She had a sleep study in 2010 which showed severe OSA.   Chronic Knee and Back Pain: Patient has chronic bilateral knee pain and lower back pain related to severe osteoarthritis and her morbid obesity. She is taking Norco 5-325 mg Q6H prn, typically 2-3 pills per day. She reports increased difficulty with completing day to day activities such as cooking, cleaning, and grocery shopping due to pain. She reports she is unable to stand for long periods of time, such as when she washes dishes, due to pain. She has been using her husband's wheelchair at home when he isn't using it to take off some of this burden. She utilizes the powered wheelchair carts at the grocery store. She is interested in getting a powered wheelchair at home so that she is able to complete her ADLs and help take of  her husband who is disabled from prior strokes.   Depression: Reports she is feeling depressed nearly every day. She reports increased fatigue, difficulty sleeping, and finding little pleasure in things. She reports increased burden and feeling tired from having to take care of her husband. She feels she is having trouble taking care of her own health in addition to her husband's. She is on max dose of Prozac, 80 mg daily. She asked me if I could prescribe her Abilify as her daughter is a Marine scientist and suggested this medication.  She was interested in seeing a psychiatrist and psychologist last year, but the referral fell through after there was difficulty getting ahold of her. She is still interested in seeing psych to see if she could benefit from additional or different therapy.   Ingrown toenails: Reports having several ingrown toenails on both feet. She describes these as painful and bothersome. She does not feel comfortable cutting her own toenails and is worried about infection. She would like to see podiatry.  Past Medical History:  Diagnosis Date  . Acute respiratory failure with hypoxia ([place]) 09/26/2012  . Bilateral knee pain   . Cellulitis of lower leg    left  . Chronic combined systolic and diastolic congestive heart failure ([place]) 10/10/2012   2 D echo 09/2012:  Left ventricle: Extremely poor acoustic windows limit study. Overall  LVEF appears to be severely depressed. Recomm ordering limited study with contrast to further evaluate LV systolic function. The cavity size was normal. Wall thickness was normal. Doppler parameters are consistent with abnormal left ventricular relaxation (grade 1 diastolic dysfunction).   2 D echo 03/2009 :  Left ventricle: The cavity size was normal. There was mild concentric hypertrophy. Systolic function was mildly reduced. The estimated ejection fraction was in the range of 45% to 50%. Diffuse hypokinesis. Doppler parameters are consistent with abnormal left  ventricular relaxation (grade 1 diastolic dysfunction).     . Chronic kidney disease 10/10/2012   Stage 2 with GFR of 64   . Depression   . Diabetes mellitus   . Gout   . H/O: hysterectomy   . Hyperlipidemia   . Hypertension   . Malignant hypertension 09/26/2012  . Morbid obesity ([place])   . Nonischemic cardiomyopathy ([place])   . Sleep apnea    on cpap  . Unspecified essential hypertension 03/22/2009   Qualifier: Diagnosis of  By: Karrie Meres, RN, BSN, [name]      Review of Systems:   General: Denies fever, chills, night sweats, changes in appetite HEENT: Denies headaches, ear pain, changes in vision, rhinorrhea, sore throat CV: Denies palpitations Pulm: Denies cough, wheezing GI: Denies abdominal pain, nausea, vomiting, diarrhea, constipation, melena, hematochezia GU: Denies dysuria, hematuria, frequency Msk: Denies muscle cramps, joint pains Neuro: Denies weakness, numbness, tingling Skin: Denies rashes, bruising Psych: Denies hallucinations  Physical Exam:  Vitals:   09/25/16 1426  BP: (!) 85/48  Pulse: 62  Temp: 98.1 F (36.7 C)  TempSrc: Oral  SpO2: 96%  Weight: 298 lb 3.2 oz (135.3 kg)   General: Morbidly obese woman sitting up, NAD HEENT: [place]/AT, EOMI, sclera anicteric, mucus membranes moist CV: RRR, no m/g/r Pulm: mild crackles at bases bilaterally, breaths non-labored on room air Abd: BS+, soft, obese, non-tender Ext: warm, no peripheral edema noted Neuro: alert and oriented x 3, no focal deficits  Assessment & Plan:   See Encounters Tab for problem based charting.  Patient discussed with Dr. Dareen Piano

## 2016-09-26 ENCOUNTER — Telehealth: Payer: Self-pay

## 2016-09-26 DIAGNOSIS — L600 Ingrowing nail: Secondary | ICD-10-CM | POA: Insufficient documentation

## 2016-09-26 NOTE — Assessment & Plan Note (Signed)
Patient to bring stool cards to next visit. DEXA scan ordered and scheduled.

## 2016-09-26 NOTE — Assessment & Plan Note (Signed)
We discussed the importance of using her CPAP machine every night and the consequences of untreated OSA. Will place a DME order so that she can get the supplies she needs to utilize her machine (new face mask, tubing, etc).

## 2016-09-26 NOTE — Telephone Encounter (Signed)
Patient returning your call.

## 2016-09-26 NOTE — Assessment & Plan Note (Signed)
Her PHQ-9 score is 11 for me today. She notes feeling depressed nearly every day and is so fatigued that she is having trouble keeping up with everyday activities. I think she has some caregiver burden as well as she has to take care of her disabled husband. I explained to her that I did not feel comfortable prescribing her Abilify and that a referral to Psychiatry is appropriate as her depression is refractory to Prozac. She has also tried Wellbutrin without any benefit. Will place another referral to Psychiatry to see if this can be arranged at this time.

## 2016-09-26 NOTE — Assessment & Plan Note (Signed)
Robin Hardin is on chronic opioid therapy for chronic pain. The date of the controlled substances contract is referenced in the [place] and / or the overview. Date of pain contract was 05/29/16. As part of the treatment plan, the [place] controlled substance database is checked at least twice yearly and the database results are appropriate. I have last reviewed the results on 09/25/16.   The last UDS was on 11/08/15 and results are as expected. Patient needs at least a yearly UDS.   The patient is on hydrocodone/acetaminophen (Lorcet, Lortab, Norco, Vicodin) strength 5-325 mg, 90 per 30 days. This regimen allows Robin Hardin to function and does not cause excessive sedation or other side effects.  "The benefits of continuing opioid therapy outweigh the risks and chronic opioids will be continued. Ongoing education about safe opioid treatment is provided  Interventions today include: UDS, Refills - 3 paper Rx printed and Referral to Psychiatry for depression She continues to be limited in her mobility due to her chronic bilateral knee and lower back pain. I suspect a lot of this is related to her morbid obesity and her depression contributing as well. Her decreased mobility is leading to difficulty in keeping up with her ADLs and responsibilities at home such as taking care of her husband. I think in addition to continued opioid therapy she would benefit from a powered wheelchair. Will have her meet with representative from wheelchair company to see if she is a candidate, DME order placed. I have also placed a referral to Psychiatry to see if her depression can be better managed (refer to separate documentation).

## 2016-09-26 NOTE — Telephone Encounter (Signed)
Attempted to call patient regarding psych referral no answer voicemail not set up will call back

## 2016-09-26 NOTE — Assessment & Plan Note (Signed)
BP on lower side today at 85/48 but this was a wrist measurement. She is not symptomatic. Continue Coreg 25 mg BID, Entresto 24-26 mg BID, and Spironolactone 12.5 mg daily. Will continue Lasix 80 mg BID and have her follow up next week to see if this needs to be adjusted.

## 2016-09-26 NOTE — Assessment & Plan Note (Addendum)
Her weight is up again (5 lbs) and she is symptomatic with orthopnea, chest pressure, and SOB suggesting a mild CHF exacerbation. She ran out of Lasix 3 days ago. Will have her continue Lasix 80 mg BID and follow up in 1 week to recheck her weight, symptoms, and obtain a bmet. Baseline weight around 293 lbs. I refilled her Lasix so she should have enough pills if she needs to continue the higher dose of 80 mg BID. I also recommended that she make an appointment with Dr. Aundra Dubin.

## 2016-09-26 NOTE — Assessment & Plan Note (Signed)
Will place referral to podiatry

## 2016-09-27 ENCOUNTER — Telehealth: Payer: Self-pay | Admitting: *Deleted

## 2016-09-27 NOTE — Telephone Encounter (Signed)
Received fax from [facility] stating potassium  20 meq and spironolactone 25 mg may cause hyperkalmemia. They need confirmation that u are aware of this interaction and ok to fill this combination. Thanks

## 2016-09-27 NOTE — Telephone Encounter (Signed)
Yes, I am aware. Patient has remained stable on this combination for awhile now. She is only taking 12.5 mg of the Spironolactone.

## 2016-09-28 NOTE — Telephone Encounter (Signed)
[facility] pharmacy informed of Dr Rivet's response.

## 2016-10-01 ENCOUNTER — Telehealth: Payer: Self-pay

## 2016-10-01 LAB — TOXASSURE SELECT,+ANTIDEPR,UR

## 2016-10-01 NOTE — Telephone Encounter (Signed)
Called patient's number no answer left message regarding psych referral

## 2016-10-01 NOTE — Telephone Encounter (Signed)
Call to emergency contact in attempt to contact patient regarding  psych referral no answer no voice mail set up unable to leave message

## 2016-10-02 ENCOUNTER — Encounter: Payer: Self-pay | Admitting: Internal Medicine

## 2016-10-02 ENCOUNTER — Ambulatory Visit (INDEPENDENT_AMBULATORY_CARE_PROVIDER_SITE_OTHER): Payer: Medicare HMO | Admitting: Internal Medicine

## 2016-10-02 VITALS — BP 102/59 | HR 61 | Temp 98.0°F | Wt 296.7 lb

## 2016-10-02 DIAGNOSIS — Z79899 Other long term (current) drug therapy: Secondary | ICD-10-CM

## 2016-10-02 DIAGNOSIS — I5042 Chronic combined systolic (congestive) and diastolic (congestive) heart failure: Secondary | ICD-10-CM | POA: Diagnosis not present

## 2016-10-02 DIAGNOSIS — I110 Hypertensive heart disease with heart failure: Secondary | ICD-10-CM

## 2016-10-02 DIAGNOSIS — I10 Essential (primary) hypertension: Secondary | ICD-10-CM

## 2016-10-02 DIAGNOSIS — Z9114 Patient's other noncompliance with medication regimen: Secondary | ICD-10-CM | POA: Diagnosis not present

## 2016-10-02 NOTE — Progress Notes (Signed)
   CC: follow-up of congestive heart failure.  HPI:  [name] is a 66 y.o. F here for follow-up of her chronic combined systolic and diastolic congestive heart failure. [name] was seen in clinic 09/26/16 by her PCP, at which time she had complaints of SOB and orthopnea following 3 days of lasix non-compliance secondary to running out. She was found to be approximately 5 lbs up from baseline (baseline 293) and was instructed to increase her lasix dose to 80 mg BID and follow-up in 1 week. Today, the patient continues to complain of shortness of breath and orthopnea, however this has improved over the past week. No complaints. Denied any fatigue, weakness or lightheadedness.   Past Medical History:  Diagnosis Date  . Acute respiratory failure with hypoxia ([place]) 09/26/2012  . Bilateral knee pain   . Cellulitis of lower leg    left  . Chronic combined systolic and diastolic congestive heart failure ([place]) 10/10/2012   2 D echo 09/2012:  Left ventricle: Extremely poor acoustic windows limit study. Overall LVEF appears to be severely depressed. Recomm ordering limited study with contrast to further evaluate LV systolic function. The cavity size was normal. Wall thickness was normal. Doppler parameters are consistent with abnormal left ventricular relaxation (grade 1 diastolic dysfunction).   2 D echo 03/2009 :  Left ventricle: The cavity size was normal. There was mild concentric hypertrophy. Systolic function was mildly reduced. The estimated ejection fraction was in the range of 45% to 50%. Diffuse hypokinesis. Doppler parameters are consistent with abnormal left ventricular relaxation (grade 1 diastolic dysfunction).     . Chronic kidney disease 10/10/2012   Stage 2 with GFR of 64   . Depression   . Diabetes mellitus   . Gout   . H/O: hysterectomy   . Hyperlipidemia   . Hypertension   . Malignant hypertension 09/26/2012  . Morbid obesity ([place])   . Nonischemic cardiomyopathy ([place])   .  Sleep apnea    on cpap  . Unspecified essential hypertension 03/22/2009   Qualifier: Diagnosis of  By: Karrie Meres, RN, BSN, [name]      Review of Systems:  Review of Systems  Constitutional: Negative for chills, fever and malaise/fatigue.  Respiratory: Positive for shortness of breath. Negative for cough and wheezing.   Cardiovascular: Positive for orthopnea and leg swelling. Negative for chest pain and palpitations.  Gastrointestinal: Negative for abdominal pain, nausea and vomiting.  Genitourinary: Negative for frequency and urgency.  Neurological: Negative for dizziness, weakness and headaches.   Physical Exam: Physical Exam  Constitutional: She appears well-developed and well-nourished. No distress.  Cardiovascular: Normal rate, regular rhythm and normal heart sounds.   Pulmonary/Chest: Effort normal and breath sounds normal. No respiratory distress. She has no wheezes. She has no rales.  Abdominal: Soft. Bowel sounds are normal. She exhibits distension. There is no tenderness. There is no guarding.  Musculoskeletal: She exhibits edema (trace BL le). She exhibits no tenderness.  Skin: Skin is warm and dry. She is not diaphoretic.    Vitals:   10/02/16 1411  BP: (!) 102/59  Pulse: 61  Temp: 98 F (36.7 C)  TempSrc: Oral  SpO2: 95%  Weight: 296 lb 11.2 oz (134.6 kg)   Assessment & Plan:   See Encounters Tab for problem based charting.  Patient discussed with Dr. Lynnae January

## 2016-10-02 NOTE — Assessment & Plan Note (Signed)
Still volume-up today despite 1 week of Lasix 80 mg BID, weight is 296 lbs. Denies any weakness, LH, dizziness. Will continue diuresing patient with Lasix 80 mg BID and follow-up in 1 week. Will be checking BMET today to ensure renal function stable. -Lasix 80 mg BID -Follow-up 1 week. If euvolemic and at dry weight, consider decreasing to usual dose of 80 QAM and 40 mg QPM -BMET today to ensure renal function stable

## 2016-10-02 NOTE — Assessment & Plan Note (Addendum)
BP on the softer side today however patient asymptomatic. Suspect this will improve with improved volume status. Continue current management.

## 2016-10-02 NOTE — Patient Instructions (Addendum)
It was a pleasure meeting you today!   Today we talked about your heart failure. You are not quite at your 'dry weight' and you are still having symptoms of shortness of breath. We need to continue diuresing you with Lasix 80 mg twice daily for another week. You will again need to follow-up in the clinic in 1 week to make sure you are doing well. Today I am obtaining blood work to make sure your renal function is stable.  Please continue to check your weight daily. If you notice weight gain of 3 lbs in 2 days or 5 lbs over a week, please call the clinic so we can adjust your lasix dose.

## 2016-10-03 ENCOUNTER — Encounter (HOSPITAL_COMMUNITY): Payer: Self-pay

## 2016-10-03 LAB — BMP8+ANION GAP
ANION GAP: 18 mmol/L (ref 10.0–18.0)
BUN/Creatinine Ratio: 17 (ref 12–28)
BUN: 22 mg/dL (ref 8–27)
CO2: 26 mmol/L (ref 18–29)
CREATININE: 1.28 mg/dL — AB (ref 0.57–1.00)
Calcium: 8.6 mg/dL — ABNORMAL LOW (ref 8.7–10.3)
Chloride: 105 mmol/L (ref 96–106)
GFR calc Af Amer: 51 mL/min/{1.73_m2} — ABNORMAL LOW (ref >59)
GFR, EST NON AFRICAN AMERICAN: 44 mL/min/{1.73_m2} — AB (ref >59)
Glucose: 89 mg/dL (ref 65–99)
Potassium: 3.7 mmol/L (ref 3.5–5.2)
Sodium: 149 mmol/L — ABNORMAL HIGH (ref 134–144)

## 2016-10-03 NOTE — Progress Notes (Signed)
Internal Medicine Clinic Attending  Case discussed with Dr. Rivet at the time of the visit.  We reviewed the resident's history and exam and pertinent patient test results.  I agree with the assessment, diagnosis, and plan of care documented in the resident's note.  

## 2016-10-04 ENCOUNTER — Ambulatory Visit
Admission: RE | Admit: 2016-10-04 | Discharge: 2016-10-04 | Disposition: A | Discharge status: Home / Self Care | Payer: Medicare HMO | Source: Ambulatory Visit | Attending: Internal Medicine | Admitting: Internal Medicine

## 2016-10-04 DIAGNOSIS — Z780 Asymptomatic menopausal state: Secondary | ICD-10-CM | POA: Diagnosis not present

## 2016-10-04 DIAGNOSIS — E2839 Other primary ovarian failure: Secondary | ICD-10-CM

## 2016-10-04 DIAGNOSIS — Z13820 Encounter for screening for osteoporosis: Secondary | ICD-10-CM | POA: Diagnosis not present

## 2016-10-05 NOTE — Progress Notes (Signed)
Internal Medicine Clinic Attending  Case discussed with Dr. Danford Bad at the time of the visit.  We reviewed the resident's history and exam and pertinent patient test results.  I agree with the assessment, diagnosis, and plan of care documented in the resident's note.

## 2016-10-09 ENCOUNTER — Ambulatory Visit (INDEPENDENT_AMBULATORY_CARE_PROVIDER_SITE_OTHER): Payer: Medicare HMO | Admitting: Internal Medicine

## 2016-10-09 VITALS — BP 127/77 | HR 50 | Temp 97.9°F | Wt 295.0 lb

## 2016-10-09 DIAGNOSIS — I2723 Pulmonary hypertension due to lung diseases and hypoxia: Secondary | ICD-10-CM | POA: Diagnosis not present

## 2016-10-09 DIAGNOSIS — I5042 Chronic combined systolic (congestive) and diastolic (congestive) heart failure: Secondary | ICD-10-CM

## 2016-10-09 DIAGNOSIS — E669 Obesity, unspecified: Secondary | ICD-10-CM | POA: Diagnosis not present

## 2016-10-09 DIAGNOSIS — R5381 Other malaise: Secondary | ICD-10-CM | POA: Diagnosis not present

## 2016-10-09 DIAGNOSIS — G4733 Obstructive sleep apnea (adult) (pediatric): Secondary | ICD-10-CM

## 2016-10-09 DIAGNOSIS — Z9119 Patient's noncompliance with other medical treatment and regimen: Secondary | ICD-10-CM | POA: Diagnosis not present

## 2016-10-09 DIAGNOSIS — Z79899 Other long term (current) drug therapy: Secondary | ICD-10-CM

## 2016-10-09 NOTE — Patient Instructions (Signed)
Please decrease your lasix to 80 mg in the morning and 40 mg at night. Wear compression stockings. Please follow up with Dr. Aundra Dubin, Heart Failure. Make sure to contact them for a refill on Entresto to get it for free!  We have reordered your CPAP machine. It will be important to use this every night.

## 2016-10-09 NOTE — Progress Notes (Signed)
Case discussed with Dr. Quay Burow at time of visit.  We reviewed the resident's history and exam and pertinent patient test results.  I agree with the assessment, diagnosis, and plan of care documented in the resident's note with the exception that the patient's weight today is 295 pounds.

## 2016-10-09 NOTE — Assessment & Plan Note (Signed)
On 09/26/2016 patient was seen in the clinic for complaint of worsening shortness of breath and orthopnea. She had reportedly been out of Lasix for 3 days. Her weight had increased from 293-298. Her Lasix was restarted at 80 mg twice a day. She was instructed to follow-up in one week. At follow-up visit on 10/02/2016, patient reported that her shortness of breath and orthopnea were improving but she wasn't back to her baseline. Her weight had decreased from 298-296. She had been compliant with Lasix 80 mg twice a day. Her basic metabolic panel showed stable renal function with creatinine of 1.28 which is at patient's baseline. However, her sodium had been trending upwards to 149. Today, patient presents for follow-up. She states that she continues to improve, but still feels above her baseline as she feels puffy all over and has mild shortness of breath at rest and orthopnea. However she is able to lay flat in bed to sleep at night. On further investigation, patient ran out of her entresto 2 days ago. She receives this medication from her cardiologist as they are able to submit paperwork so that she can receive it for free. She continues to be compliant with Lasix 80 mg twice a day. She also reports to me that she has not worn her CPAP machine for her severe obstructive sleep apnea in almost 2 years. Patient's baseline weight since August 2017 appears to be between 193 and 195. Her weight today is 195. Blood pressure is normal at 127/77, heart rate 50, satting at 95% on room air. Overall, patient appears euvolemic on exam-lungs are clear, mild nonpitting edema in her ankles. Weight is close to her baseline. In fact, this may be her new baseline. The etiology of her shortness of breath could certainly be multifactorial given obesity, would consider other etiologies such as OSA noncompliant with CPAP, pulmonary hypertension, deconditioning. Given that her sodium is rising, I would not continue her Lasix to 80 twice a  day. I discussed increasing her spironolactone from 12.5-25 mg once a day, but patient refused stating that this medication made her feel sick.  Plan: -Revert to Lasix 80 mg once in the morning and 40 mg at night. -Continue spironolactone 12.5 mg once a day -Coreg twice a day -Call cardiology to refill entresto twice a day through medication program -Compression stockings -Discussed compliance with diet -We ordered supplies for CPAP machine, discussed importance of compliance -Follow-up in 1-2 weeks for recheck

## 2016-10-09 NOTE — Progress Notes (Signed)
CC: Follow-up for shortness of breath  HPI: [name] is a 66 y.o. female with PMHx of chronic combined heart failure, CKD, OSA who presents to the clinic for follow-up for shortness of breath.   On 09/26/2016 patient was seen in the clinic for complaint of worsening shortness of breath and orthopnea. She had reportedly been out of Lasix for 3 days. Her weight had increased from 293-298. Her Lasix was restarted at 80 mg twice a day. She was instructed to follow-up in one week. At follow-up visit on 10/02/2016, patient reported that her shortness of breath and orthopnea were improving but she wasn't back to her baseline. Her weight had decreased from 298-296. She had been compliant with Lasix 80 mg twice a day. Her basic metabolic panel showed stable renal function with creatinine of 1.28 which is at patient's baseline. However, her sodium had been trending upwards to 149. Today, patient presents for follow-up. She states that she continues to improve, but still feels above her baseline as she feels puffy all over and has mild shortness of breath at rest and orthopnea. However she is able to lay flat in bed to sleep at night. On further investigation, patient ran out of her entresto 2 days ago. She receives this medication from her cardiologist as they are able to submit paperwork so that she can receive it for free. She continues to be compliant with Lasix 80 mg twice a day. She also reports to me that she has not worn her CPAP machine for her severe obstructive sleep apnea in almost 2 years. Patient's baseline weight since August 2017 appears to be between 193 and 195. Her weight today is 195. Blood pressure is normal at 127/77, heart rate 50, satting at 95% on room air.  Past Medical History:  Diagnosis Date  . Acute respiratory failure with hypoxia ([place]) 09/26/2012  . Bilateral knee pain   . Cellulitis of lower leg    left  . Chronic combined systolic and diastolic congestive heart  failure ([place]) 10/10/2012   2 D echo 09/2012:  Left ventricle: Extremely poor acoustic windows limit study. Overall LVEF appears to be severely depressed. Recomm ordering limited study with contrast to further evaluate LV systolic function. The cavity size was normal. Wall thickness was normal. Doppler parameters are consistent with abnormal left ventricular relaxation (grade 1 diastolic dysfunction).   2 D echo 03/2009 :  Left ventricle: The cavity size was normal. There was mild concentric hypertrophy. Systolic function was mildly reduced. The estimated ejection fraction was in the range of 45% to 50%. Diffuse hypokinesis. Doppler parameters are consistent with abnormal left ventricular relaxation (grade 1 diastolic dysfunction).     . Chronic kidney disease 10/10/2012   Stage 2 with GFR of 64   . Depression   . Diabetes mellitus   . Gout   . H/O: hysterectomy   . Hyperlipidemia   . Hypertension   . Malignant hypertension 09/26/2012  . Morbid obesity ([place])   . Nonischemic cardiomyopathy ([place])   . Sleep apnea    on cpap  . Unspecified essential hypertension 03/22/2009   Qualifier: Diagnosis of  By: Karrie Meres, RN, BSN, [name]       Review of Systems: Please see pertinent ROS reviewed in HPI and problem based charting.   Physical Exam: Vitals:   10/09/16 1506  BP: 127/77  Pulse: (!) 50  Temp: 97.9 F (36.6 C)  TempSrc: Oral  SpO2: 95%  Weight: 295 lb (133.8 kg)  General: Vital signs reviewed.  Patient is Obese, in no acute distress and cooperative with exam.  Neck: Supple, trachea midline, mild JVD, however difficult exam  Cardiovascular: Bradycardic, regular rhythm, S1 normal, S2 normal, no murmurs, gallops, or rubs. Pulmonary/Chest: Clear to auscultation bilaterally, no wheezes, rales, or rhonchi. Abdominal: Soft, non-tender, non-distended, BS +, obese, not edematous.  Extremities: 1+ nonpitting lower extremity edema bilaterally to ankles Skin: Warm, dry and intact. No rashes or  erythema. Psychiatric: Normal mood and affect. speech has a stutter. behavior is normal. Cognition and memory are normal.   Assessment & Plan:  See encounters tab for problem based medical decision making. Patient discussed with Dr. Eppie Gibson

## 2016-10-09 NOTE — Assessment & Plan Note (Signed)
She also reports to me that she has not worn her CPAP machine for her severe obstructive sleep apnea in almost 2 years. Patient's last sleep study was in 2010 and showed severe OSA.  Plan: -We ordered supplies for CPAP machine, discussed importance of compliance -Follow-up in 1-2 weeks for recheck

## 2016-10-11 ENCOUNTER — Ambulatory Visit (INDEPENDENT_AMBULATORY_CARE_PROVIDER_SITE_OTHER): Payer: Medicare HMO | Admitting: Podiatry

## 2016-10-11 ENCOUNTER — Other Ambulatory Visit: Payer: Self-pay | Admitting: *Deleted

## 2016-10-11 ENCOUNTER — Encounter: Payer: Self-pay | Admitting: Podiatry

## 2016-10-11 DIAGNOSIS — L600 Ingrowing nail: Secondary | ICD-10-CM | POA: Diagnosis not present

## 2016-10-11 MED ORDER — NEOMYCIN-POLYMYXIN-HC 1 % OT SOLN: OTIC | 1 refills | Status: DC

## 2016-10-11 NOTE — Patient Instructions (Signed)
Betadine Soak Instructions  Purchase an 8 oz. bottle of BETADINE solution (Povidone)  THE DAY AFTER THE PROCEDURE  Place 1 tablespoon of betadine solution in a quart of warm tap water.  Submerge your foot or feet with outer bandage intact for the initial soak; this will allow the bandage to become moist and wet for easy lift off.  Once you remove your bandage, continue to soak in the solution for 20 minutes.  This soak should be done twice a day.  Next, remove your foot or feet from solution, blot dry the affected area and cover.  You may use a band aid large enough to cover the area or use gauze and tape.  Apply other medications to the area as directed by the doctor such as cortisporin otic solution (ear drops) or neosporin.  IF YOUR SKIN BECOMES IRRITATED WHILE USING THESE INSTRUCTIONS, IT IS OKAY TO SWITCH TO EPSOM SALTS AND WATER OR WHITE VINEGAR AND WATER.   [facility] Instructions-Post Nail Surgery  You have had your ingrown toenail and root treated with a chemical.  This chemical causes a burn that will drain and ooze like a blister.  This can drain for 6-8 weeks or longer.  It is important to keep this area clean, covered, and follow the soaking instructions dispensed at the time of your surgery.  This area will eventually dry and form a scab.  Once the scab forms you no longer need to soak or apply a dressing.  If at any time you experience an increase in pain, redness, swelling, or drainage, you should contact the office as soon as possible.

## 2016-10-11 NOTE — Progress Notes (Signed)
   Subjective:    [name], female    DOB: 06/28/1951, 66 y.o.   MRN: [id number]  HPI: She presents today with a chief complaint of painful ingrown nail margins to the tibia and fibula border of the hallux bilaterally. She states that she has to get them trimmed at the salon but doesn't keep them from hurting at all. She states they're extremely painful and like to have them corrected if possible.    Review of Systems  Constitutional: Positive for fatigue and unexpected weight change.  Eyes: Positive for visual disturbance.  Respiratory: Positive for shortness of breath.   Gastrointestinal: Positive for abdominal distention.  Genitourinary: Positive for frequency.  Musculoskeletal: Positive for arthralgias, back pain and gait problem.  Psychiatric/Behavioral: Positive for behavioral problems.  All other systems reviewed and are negative.      Objective:   Physical Exam: Vital signs are stable alert and oriented 3. Pulses are strongly palpable. Neurologic sensorium is intact. Deep tendon reflexes are intact. Muscle strength +5 over 5 dorsiflexion plantar flexors and inverters everters all adjacent musculature is intact. Orthopedic evaluation demonstrates all joints distal to the ankle, full range of motion without crepitation. Cutaneous evaluation demonstrates supple well-hydrated cutis with exception of sharp incurvated nail margin along the tibial and fibular borders of the hallux bilateral. No open wounds or lesions are noted no purulence and no malodor is noted.      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  Plan: Discussed etiology and pathology conservative versus surgical therapies. At this point chemical matrixectomy was performed after local anesthesia was administered. She tolerated the procedure well without complications she was provided with both oral and written home-going instructions for care  and soaking of her toe as well as a prescription for Cortisporin Otic to be applied twice daily after soaking. I will follow-up with her in 1 week.

## 2016-10-12 ENCOUNTER — Other Ambulatory Visit (HOSPITAL_COMMUNITY): Payer: Self-pay | Admitting: Cardiology

## 2016-10-12 NOTE — Telephone Encounter (Signed)
Patient called to request a new rx for entresto as she get from the mail order.  Reviewed with [name], PharmD she will actually needed a new PAP application.   Patient advised to bring in income information ans sign new application so we could process. Pt voiced understanding

## 2016-10-17 ENCOUNTER — Ambulatory Visit (INDEPENDENT_AMBULATORY_CARE_PROVIDER_SITE_OTHER): Payer: Medicare HMO | Admitting: Psychiatry

## 2016-10-17 ENCOUNTER — Encounter (HOSPITAL_COMMUNITY): Payer: Self-pay | Admitting: Psychiatry

## 2016-10-17 VITALS — BP 132/84 | HR 66 | Ht 63.0 in | Wt 291.2 lb

## 2016-10-17 DIAGNOSIS — F331 Major depressive disorder, recurrent, moderate: Secondary | ICD-10-CM

## 2016-10-17 DIAGNOSIS — Z79899 Other long term (current) drug therapy: Secondary | ICD-10-CM

## 2016-10-17 MED ORDER — MIRTAZAPINE 15 MG PO TABS: 15.0000 mg | ORAL_TABLET | Freq: Every day | ORAL | 0 refills | Status: DC

## 2016-10-17 MED ORDER — MIRTAZAPINE 15 MG PO TABS: 15.0000 mg | ORAL_TABLET | Freq: Every day | ORAL | 1 refills | Status: DC

## 2016-10-17 MED ORDER — MIRTAZAPINE 15 MG PO TABS: 15.0000 mg | ORAL_TABLET | Freq: Every day | ORAL | 2 refills | Status: DC

## 2016-10-17 NOTE — Progress Notes (Signed)
Psychiatric Initial Adult Assessment   Patient Identification: Robin Hardin MRN:  [id number] Date of Evaluation:  10/17/2016 Referral Source: pcp Chief Complaint:  depression Visit Diagnosis:     ICD-9-CM ICD-10-CM   1. MDD (major depressive disorder), recurrent episode, moderate (HCC) 296.32 F33.1 mirtazapine (REMERON) 15 MG tablet     DISCONTINUED: mirtazapine (REMERON) 15 MG tablet   History of Present Illness:  Robin Hardin is a 66 year old female with a history of congestive heart failure, ckd, hyperlipidemia, hypertension, gout, obstructive sleep apnea, and obesity who presents today for psychiatric assessment and med management. She has a history of depression and is on fluoxetine 80 mg daily, in addition to trazodone 50 mg daily at bedtime for sleep.  The patient is 15 minutes late on arrival, and shares that it was very difficult for her to get in and up to the building due to difficulty ambulating, and requiring a wheelchair when she got into the building. Spent time empathizing with this difficulty in hearing about her multiple health issues.   The patient reports that she is here because she needs help with her depression, and her sleep. She reports that she is struggle with depression for many years, and used to see a psychiatrist in the past, but things have been okay until her daughter moved in. She reports that her daughter moved in in November 2016, along with the daughter's husband and 3 children. Her daughter now has a fourth child, who is 15 months years old. She reports that her daughter does not help around the house, her husband is domineering and behaves as if he owns the home, and they do not help with the multiple medical needs of the patient and her husband.  The patient reports that she has attempted multiple times to share her frustration with her daughter, but it is usually dismissed. She shares that she feels used, and her finances have been damaged, because of  the high cost of feeding a family of 8, increased electric bills, increase water bills, and damage to the home that the kids have inflicted with her horse play. 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  We spent time thinking about behavioral strategies, limit setting, and ways for her to advocate her needs with her daughter and son-in-law. The patient reports that her husband used to be a better support, but he has multiple health issues, goes to dialysis 3 times weekly, and he continues to struggle with his own depression. She shares a sense of frustration, that has contributed to anhedonia, difficulty sleeping, irritable mood, feeling down and depressed. She shares that her goal is to have her depression and her sleep better, so hopefully she'll have the energy and motivation to be able to set limits with her daughter and the grandchildren and to be able to consider ways to push her daughter and son-in-law to get their own place for them and the kids.  Discussing her current regimen, she continues on Prozac 80 mg, and feels the Prozac has generally been very helpful for her. She does not feel the trazodone has been effective. I spent time discussing Remeron for sleep and as an augmenting agent for depression and anxiety. I warned her against the potential that this may cause weight gain, but the benefits of improving her mood are worth a trial on  the medication to see if it is tolerated. Discussed the mechanism and expected effects. She agrees to start at a dose of 50 mg at bedtime. We will discontinue trazodone given lack of effect.  Associated Signs/Symptoms: Depression Symptoms:  depressed mood, anhedonia, insomnia, fatigue, feelings of worthlessness/guilt, loss of energy/fatigue, disturbed sleep, increased appetite, decreased  appetite, (Hypo) Manic Symptoms:  none Anxiety Symptoms:  none Psychotic Symptoms:  none PTSD Symptoms: Negative  Past Psychiatric [facility] psychiatric hospitalizations. She reports that she used to see a psychiatrist many years ago for medication management for depression  Previous Psychotropic Medications: Yes   Substance Abuse History in the last 12 months:  No.  Consequences of Substance Abuse: Negative  Past Medical History:  Past Medical History:  Diagnosis Date  . Acute respiratory failure with hypoxia ([place]) 09/26/2012  . Bilateral knee pain   . Cellulitis of lower leg    left  . Chronic combined systolic and diastolic congestive heart failure ([place]) 10/10/2012   2 D echo 09/2012:  Left ventricle: Extremely poor acoustic windows limit study. Overall LVEF appears to be severely depressed. Recomm ordering limited study with contrast to further evaluate LV systolic function. The cavity size was normal. Wall thickness was normal. Doppler parameters are consistent with abnormal left ventricular relaxation (grade 1 diastolic dysfunction).   2 D echo 03/2009 :  Left ventricle: The cavity size was normal. There was mild concentric hypertrophy. Systolic function was mildly reduced. The estimated ejection fraction was in the range of 45% to 50%. Diffuse hypokinesis. Doppler parameters are consistent with abnormal left ventricular relaxation (grade 1 diastolic dysfunction).     . Chronic kidney disease 10/10/2012   Stage 2 with GFR of 64   . Depression   . Diabetes mellitus   . Gout   . H/O: hysterectomy   . Hyperlipidemia   . Hypertension   . Malignant hypertension 09/26/2012  . Morbid obesity ([place])   . Nonischemic cardiomyopathy ([place])   . Sleep apnea    on cpap  . Unspecified essential hypertension 03/22/2009   Qualifier: Diagnosis of  By: Karrie Meres, RN, BSN, [name]     No past surgical history on file.  Family Psychiatric History: none  Family History:  Family History  Problem Relation Age  of Onset  . Asthma Mother   . Asthma Brother   . Asthma Brother   . Heart disease Mother   . Stroke Mother     clotting disorders    Social History:   Social History   Social History  . Marital status: Married    Spouse name: N/A  . Number of children: 2  . Years of education: N/A   Occupational History  . retired cna Unemployed   Social History Main Topics  . Smoking status: Never Smoker  . Smokeless tobacco: Never Used  . Alcohol use No  . Drug use: No  . Sexual activity: Not Asked   Other Topics Concern  . None   Social History Narrative  . None    Additional Social History: Currently lives at home with her husband, daughter, son-in-law, 4 grandchildren (ages 41, 61, 13 and 86 months)   Allergies:  No Known Allergies  Metabolic Disorder Labs: Lab Results  Component Value Date   HGBA1C 5.5 11/08/2015   MPG 123 (H) 09/26/2012   MPG 148 (H) 05/26/2010   No results found for: PROLACTIN Lab Results  Component Value Date   CHOL 196 06/11/2016   TRIG 81 06/11/2016  HDL 58 06/11/2016   CHOLHDL 3.4 06/11/2016   VLDL 16 06/11/2016   LDLCALC 122 (H) 06/11/2016   LDLCALC 221 (H) 01/11/2016     Current Medications: Current Outpatient Prescriptions  Medication Sig Dispense Refill  . allopurinol (ZYLOPRIM) 300 MG tablet Take 1 tablet (300 mg total) by mouth daily. 90 tablet 1  . ALOE PO Take 1 capsule by mouth 2 (two) times daily.    Marland Kitchen aspirin EC 81 MG EC tablet Take 1 tablet (81 mg total) by mouth daily. 30 tablet 2  . carvedilol (COREG) 25 MG tablet Take 1 tablet (25 mg total) by mouth 2 (two) times daily with a meal. 180 tablet 1  . ferrous sulfate 325 (65 FE) MG EC tablet Take 325 mg by mouth daily with breakfast.    . FLUoxetine (PROZAC) 40 MG capsule Take 2 capsules (80 mg total) by mouth daily. 60 capsule 5  . furosemide (LASIX) 80 MG tablet Take 1 tablet (80 mg total) by mouth 2 (two) times daily. Take 1 tab (80 mg total) every morning and 1/2 tab (40 mg  total) every evening. 180 tablet 1  . HYDROcodone-acetaminophen (NORCO/VICODIN) 5-325 MG tablet Take 1 tablet by mouth every 6 (six) hours as needed for severe pain. 90 tablet 0  . mirtazapine (REMERON) 15 MG tablet Take 1 tablet (15 mg total) by mouth at bedtime. 90 tablet 0  . NEOMYCIN-POLYMYXIN-HYDROCORTISONE (CORTISPORIN) 1 % SOLN otic solution Apply 1-2 drops to toe BID after soaking 10 mL 1  . Potassium Chloride ER 20 MEQ TBCR TAKE TWO TABLETS BY MOUTH ONCE DAILY 180 tablet 1  . potassium chloride SA (K-DUR,KLOR-CON) 20 MEQ tablet     . rosuvastatin (CRESTOR) 20 MG tablet Take 1 tablet (20 mg total) by mouth daily. 30 tablet 11  . sacubitril-valsartan (ENTRESTO) 24-26 MG Take 1 tablet by mouth 2 (two) times daily. 60 tablet 3  . spironolactone (ALDACTONE) 25 MG tablet Take 0.5 tablets (12.5 mg total) by mouth daily. 90 tablet 3   No current facility-administered medications for this visit.     Neurologic: Headache: Negative Seizure: Negative Paresthesias:Yes  Musculoskeletal: Strength & Muscle Tone: decreased Gait & Station: in wheelchair due to fatigue/weakness Patient leans: N/A  Psychiatric Specialty Exam: Review of Systems  Constitutional: Negative.   HENT: Negative.   Respiratory: Negative.   Cardiovascular: Positive for orthopnea.  Gastrointestinal: Negative.   Genitourinary: Negative.   Musculoskeletal: Negative.   Neurological: Negative.   Psychiatric/Behavioral: Positive for depression. The patient has insomnia.     Blood pressure 132/84, pulse 66, height _0  (1.6 m), weight 132.1 kg (291 lb 3.2 oz).Body mass index is 51.58 kg/m.  General Appearance: Casual  Eye Contact:  Fair  Speech:  Clear and Coherent  Volume:  Normal  Mood:  Anxious and Depressed  Affect:  Appropriate  Thought Process:  Coherent  Orientation:  Full (Time, Place, and Person)  Thought Content:  Logical  Suicidal Thoughts:  No  Homicidal Thoughts:  No  Memory:  Immediate;   Fair   Judgement:  Fair  Insight:  Fair  Psychomotor Activity:  Normal  Concentration:  Concentration: Fair and Attention Span: Fair  Recall:  [employer] of Knowledge:Good  Language: Good  Akathisia:  Negative  Handed:  Right  AIMS (if indicated):  n/a  Assets:  Communication Skills Desire for Improvement Housing Leisure Time Social Support Transportation  ADL's:  Intact  Cognition: WNL  Sleep:  5-7 hours nightly  Treatment Plan Summary: Robin Hardin is a 66 year old female with multiple medical problems, who presents today for psychiatric intake assessment. She presents with symptoms of depression, namely anhedonia, subjective low mood, poor frustration tolerance, and sense of hopelessness. She does not present with any acute safety concerns. It appears that she has had multiple stressors as of late, including her daughter and son-in-law moving into her and her husband's home in November 2016. Per her report her daughter and son-in-law do not help out with chores and finances in the home, and their presence has added a considerable financial burden. Further the patient's other daughter is presently hospitalized for head injuries related to an MVC for about 3-4 months, and the patient continues to be distressed and worried about her daughter.  She appears to have had consistent benefit with Prozac, but her sleep symptoms continue to be poor with trazodone. Discussed a transition from trazodone to Remeron, given that Remeron is an excellent augmenting agent for MDD. We'll follow-up in 6 weeks, and I have encouraged the patient to set some limits and boundaries with her daughter. We'll follow-up on these suggestions at her next visit.  # MDD - Fluoxetine 80 mg daily for MDD - Remeron 15 mg daily at bedtime for sleep and augmenting for depression symptoms - Discontinue trazodone - Return to clinic in 6 weeks    Aundra Dubin, MD 2/28/201811:54 AM

## 2016-10-19 ENCOUNTER — Other Ambulatory Visit (HOSPITAL_COMMUNITY): Payer: Self-pay | Admitting: *Deleted

## 2016-10-19 ENCOUNTER — Telehealth (HOSPITAL_COMMUNITY): Payer: Self-pay | Admitting: *Deleted

## 2016-10-19 ENCOUNTER — Telehealth: Payer: Self-pay | Admitting: *Deleted

## 2016-10-19 NOTE — Telephone Encounter (Addendum)
[facility] called for clarification of Neomycin-Polymyxin-Hydrocortisone 1% Otic Solution to be used on toe, reference# [id number]. 10/22/2016-Pt asked if had to come for the appt tomorrow, she doesn't have the co-pay. I told pt I would check with the staff that checks pt's in, but I felt Dr. Milinda Pointer would want to see her to make sure she was healing, and bill for the co-pay. I spoke with L. Guadalupe Maple, she states Dr. Milinda Pointer would prefer to make sure she was getting well, and she noted to bill for the co-pay. I informed pt and she states she will come in tomorrow.

## 2016-10-19 NOTE — Telephone Encounter (Signed)
Patient called in saying that she needed refills sent for her Robin Hardin but needed [place] to authorize it first.  Stated she has been off medication since January and no one had called in refills.    There is no documentation of her notifying us that she was out of medication.  I explained to her that I will message [name], PharmD to see if we can get her approved for medication again.  She has a follow up appointment with [place] scheduled for March 19th and I told her if they want her to go ahead and restart it now that I will have some samples for her on Monday but I would call her back and let her know for sure.

## 2016-10-19 NOTE — Telephone Encounter (Signed)
Opened in error

## 2016-10-22 MED ORDER — SACUBITRIL-VALSARTAN 24-26 MG PO TABS: 1.0000 | ORAL_TABLET | Freq: Two times a day (BID) | ORAL | 3 refills | Status: DC

## 2016-10-22 NOTE — Addendum Note (Signed)
Addended by: Adora Fridge on: 10/22/2016 11:51 AM   Modules accepted: Orders

## 2016-10-22 NOTE — Telephone Encounter (Addendum)
Entresto 24-26 mg BID PA approved by [facility] Part D through 10/22/18. Will send to our Outpatient Pharmacy and get her set up with [employer] in anticipation of copay being too costly for her. Verified $0 copay with Outpatient Pharmacy.   [employer] coverage from 07/25/16-10/22/17: ID: [id number] BIN: [id number] GRP: [id number] PCN: [id number]  Jalee Saine K. Velva Harman, PharmD, BCPS, CPP Clinical Pharmacist Pager: [phone] Phone: [phone] 10/22/2016 9:55 AM

## 2016-10-22 NOTE — Addendum Note (Signed)
Addended by: Adora Fridge on: 10/22/2016 09:57 AM   Modules accepted: Orders

## 2016-10-23 ENCOUNTER — Ambulatory Visit (INDEPENDENT_AMBULATORY_CARE_PROVIDER_SITE_OTHER): Payer: Self-pay | Admitting: Podiatry

## 2016-10-23 ENCOUNTER — Encounter: Payer: Self-pay | Admitting: Podiatry

## 2016-10-23 DIAGNOSIS — L600 Ingrowing nail: Secondary | ICD-10-CM

## 2016-10-23 NOTE — Patient Instructions (Signed)
Epsom Salt Soak Instructions    IF SOAKING IS STILL NECESSARY, START THIS 2 WEEKS AFTER INITIAL PROCEDURE   Place 1/4 cup of epsom salts in a quart of warm tap water.  Soak your foot or feet in the solution for 20 minutes twice a day until you notice the area has dried and a scab has formed. Continue to apply other medications to the area as directed by the doctor such as polysporin, neosporin or cortisporin drops.  IF YOUR SKIN BECOMES IRRITATED WHILE USING THESE INSTRUCTIONS, IT IS OKAY TO SWITCH TO  WHITE VINEGAR AND WATER. Or you may use antibacterial soap and water to keep the toe clean  Monitor for any signs/symptoms of infection. Call the office immediately if any occur or go directly to the emergency room. Call with any questions/concerns.

## 2016-10-24 NOTE — Progress Notes (Signed)
She presents today for follow-up of her matrixectomy hallux bilateral. States that she continues to so in Epsom salts and warm water. She states that she is doing very well there is still a little sore.  Objective: Vital signs are stable alert and oriented 3. Matrixectomy's. Be healing very well there is no erythema cellulitis drainage or odor. No signs of infection.  Assessment: Well-healing matrixectomy's bilateral.  Plan: Discontinue Betadine sterile with Epsom salts and water soaks at least daily. Cover during the daytime with a Band-Aid and leave open at bedtime. Continue Cortisporin Otic drops daily and I will follow-up with her on an as-needed basis. She will continue to soak until completely resolved. Should she have questions or concerns she will notify us.

## 2016-11-05 ENCOUNTER — Ambulatory Visit (HOSPITAL_COMMUNITY)
Admission: RE | Admit: 2016-11-05 | Discharge: 2016-11-05 | Disposition: A | Discharge status: Home / Self Care | Payer: Medicare HMO | Source: Ambulatory Visit | Attending: Cardiology | Admitting: Cardiology

## 2016-11-05 ENCOUNTER — Encounter (HOSPITAL_COMMUNITY): Payer: Self-pay

## 2016-11-05 VITALS — BP 124/68 | HR 62 | Wt 304.8 lb

## 2016-11-05 DIAGNOSIS — I130 Hypertensive heart and chronic kidney disease with heart failure and stage 1 through stage 4 chronic kidney disease, or unspecified chronic kidney disease: Secondary | ICD-10-CM | POA: Insufficient documentation

## 2016-11-05 DIAGNOSIS — F329 Major depressive disorder, single episode, unspecified: Secondary | ICD-10-CM | POA: Insufficient documentation

## 2016-11-05 DIAGNOSIS — Z7982 Long term (current) use of aspirin: Secondary | ICD-10-CM | POA: Diagnosis not present

## 2016-11-05 DIAGNOSIS — I5042 Chronic combined systolic (congestive) and diastolic (congestive) heart failure: Secondary | ICD-10-CM | POA: Diagnosis not present

## 2016-11-05 DIAGNOSIS — N189 Chronic kidney disease, unspecified: Secondary | ICD-10-CM | POA: Diagnosis not present

## 2016-11-05 DIAGNOSIS — Z823 Family history of stroke: Secondary | ICD-10-CM | POA: Diagnosis not present

## 2016-11-05 DIAGNOSIS — N182 Chronic kidney disease, stage 2 (mild): Secondary | ICD-10-CM | POA: Diagnosis not present

## 2016-11-05 DIAGNOSIS — Z6841 Body Mass Index (BMI) 40.0 and over, adult: Secondary | ICD-10-CM | POA: Diagnosis not present

## 2016-11-05 DIAGNOSIS — Z79899 Other long term (current) drug therapy: Secondary | ICD-10-CM | POA: Insufficient documentation

## 2016-11-05 DIAGNOSIS — M109 Gout, unspecified: Secondary | ICD-10-CM | POA: Insufficient documentation

## 2016-11-05 DIAGNOSIS — Z8249 Family history of ischemic heart disease and other diseases of the circulatory system: Secondary | ICD-10-CM | POA: Diagnosis not present

## 2016-11-05 DIAGNOSIS — G4733 Obstructive sleep apnea (adult) (pediatric): Secondary | ICD-10-CM | POA: Diagnosis not present

## 2016-11-05 DIAGNOSIS — E785 Hyperlipidemia, unspecified: Secondary | ICD-10-CM | POA: Diagnosis not present

## 2016-11-05 DIAGNOSIS — E1122 Type 2 diabetes mellitus with diabetic chronic kidney disease: Secondary | ICD-10-CM | POA: Insufficient documentation

## 2016-11-05 DIAGNOSIS — I429 Cardiomyopathy, unspecified: Secondary | ICD-10-CM | POA: Insufficient documentation

## 2016-11-05 DIAGNOSIS — Z825 Family history of asthma and other chronic lower respiratory diseases: Secondary | ICD-10-CM | POA: Insufficient documentation

## 2016-11-05 DIAGNOSIS — M25562 Pain in left knee: Secondary | ICD-10-CM | POA: Insufficient documentation

## 2016-11-05 DIAGNOSIS — M25561 Pain in right knee: Secondary | ICD-10-CM | POA: Insufficient documentation

## 2016-11-05 LAB — BASIC METABOLIC PANEL
ANION GAP: 9 (ref 5–15)
BUN: 22 mg/dL — AB (ref 6–20)
CHLORIDE: 105 mmol/L (ref 101–111)
CO2: 28 mmol/L (ref 22–32)
Calcium: 9.2 mg/dL (ref 8.9–10.3)
Creatinine, Ser: 1.17 mg/dL — ABNORMAL HIGH (ref 0.44–1.00)
GFR calc Af Amer: 55 mL/min — ABNORMAL LOW (ref >60)
GFR, EST NON AFRICAN AMERICAN: 48 mL/min — AB (ref >60)
GLUCOSE: 88 mg/dL (ref 65–99)
POTASSIUM: 4.4 mmol/L (ref 3.5–5.1)
Sodium: 142 mmol/L (ref 135–145)

## 2016-11-05 LAB — BRAIN NATRIURETIC PEPTIDE: B Natriuretic Peptide: 81.4 pg/mL (ref 0.0–100.0)

## 2016-11-05 NOTE — Patient Instructions (Signed)
Labs today  We will contact you in 4 months to schedule your next appointment.

## 2016-11-06 NOTE — Progress Notes (Signed)
[name], female   DOB: 08/24/1950, 66 y.o.   MRN: [id number]    Advanced Heart Failure Clinic Note   Primary Care: Dr Arcelia Jew Primary Cardiologist: Dr. Aundra Dubin  HPI:  Robin Hardin is a 66 y.o. AAF with history of morbid obesity, HTN, and diastolic HF diagnosed in 6948.  She was eventually diagnosed with systolic heart failure by v-gram (09/2012) with an EF approximately 25%.  LHC showed no significant coronary disease.  She also has OSA with CPAP.   She had stopped taking her fluid pills in Feb 2014 due to cramping and ended up being admitted with respiratory failure.  She was diuresed ~40 pounds.  Her torsemide was changed to lasix to avoid cramping.  Echo showed LVEF 25%. Discharge weight 387 lbs. Echo in 12/14 showed EF 40-45% with mild LV dilation.  Last echo in 9/16 showed EF 35-40% with grade II diastolic dysfunction.  She had atypical chest pain in 6/17, Cardiolite at that time showed EF up to 52% with no ischemia/infarction.   She is now taking Lasix 80 mg bid. No dyspnea walking short distances on flat ground.  Dyspnea with steps.  Not very active.  She has been eating more since starting Remeron.  Weight is up 9 lbs.  No chest pain.  No orthopnea/PND.   She remains limited by back and knee pain.   Labs (7/14): K 3.9 Creatinine 0.91, LDL 105 Labs (06/05/13): K 3.7 Creatinine 0.87 Pro BNP 56  Labs (1/15): K 4, creatinine 0.9 Labs (07/27/14): K 4.2 Creatinine 0.92 Hgb 11.8 HIV NR Labs (5/16): K 5, creatinine 2.57, TSH normal, HCT 36.5 Labs (7/16): K 4, creatinine 1.08, LDL 132 Labs (9/16): K 3.6, creatinine 1.17 Labs (10/16): creatinine 2.19 => 1.34 Labs (11/16): K 4, creatinine 1.0, BNP 114 Labs (5/17): LDL 221, creatinine 2.07 Labs (6/17): K 4, creatinine 1.15 Labs (10/17): LDL 122, HDL 58 Labs (2/18): K 3.7, creatinine 1.28  Past Medical History:  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 She was hospitalized at [facility] in [place] where she had a cath showing normal coronaries but EF 30-35% on LV-gram. She has been hospitalized two other times since then in [place] for CHF. Echo (8/10) showed EF 45-50%, mild global HK, mild LVH, grade I diastolic dysfunction, no significant valvular problems.  Echo (2/14) with EF severely decreased (poor windows so not quantified, appears about 25%).  Echo (12/14) with EF 40-45%, mild LV dilation, moderate LVH, moderate LAE.   - Echo (9/16) with EF 35-40%, grade II diastolic dysfunction, mild MR.  - Lexiscan Cardiolite (6/17) with EF 52%, no ischemia/infarction.  2. HTN  3. Depression  4. Morbid obesity  5. Hyperlipidemia  6. OSA: sporadic with CPAP  7. Gout on allopurinol  8. Bilateral knee pain, probable osteoarthritis  9. Diabetes type II  10. Left lower leg cellulitis in 10/11 with normal [place] for DVT 11. CKD.   Family History:  No FH of premature CAD  asthma: mother, 2 brothers  heart disease: mother  clotting disorders: mother (stroke)   Social History:  Recently from [place] to [place].  Married with 2 adopted children.  Never smoked, no ETOH or drugs.  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   Review of Systems  All systems reviewed and negative except as per HPI.   Current Outpatient Prescriptions  Medication Sig Dispense Refill  . allopurinol (ZYLOPRIM) 300 MG tablet Take 1 tablet (300 mg total)  by mouth daily. 90 tablet 1  . ALOE PO Take 1 capsule by mouth 2 (two) times daily.    Marland Kitchen aspirin EC 81 MG EC tablet Take 1 tablet (81 mg total) by mouth daily. 30 tablet 2  . carvedilol (COREG) 25 MG tablet Take 1 tablet (25 mg total) by mouth 2 (two) times daily with a meal. 180 tablet 1  . ferrous sulfate 325 (65 FE) MG EC tablet Take 325 mg by mouth daily with breakfast.    . FLUoxetine (PROZAC) 40 MG capsule Take 2 capsules (80 mg total) by mouth daily. 60 capsule 5  . furosemide (LASIX) 80 MG tablet Take 80 mg by mouth 2 (two) times daily.    Marland Kitchen  HYDROcodone-acetaminophen (NORCO/VICODIN) 5-325 MG tablet Take 1 tablet by mouth every 6 (six) hours as needed for severe pain. 90 tablet 0  . mirtazapine (REMERON) 15 MG tablet Take 1 tablet (15 mg total) by mouth at bedtime. 90 tablet 0  . NEOMYCIN-POLYMYXIN-HYDROCORTISONE (CORTISPORIN) 1 % SOLN otic solution Apply 1-2 drops to toe BID after soaking 10 mL 1  . Potassium Chloride ER 20 MEQ TBCR TAKE TWO TABLETS BY MOUTH ONCE DAILY 180 tablet 1  . potassium chloride SA (K-DUR,KLOR-CON) 20 MEQ tablet     . rosuvastatin (CRESTOR) 20 MG tablet Take 1 tablet (20 mg total) by mouth daily. 30 tablet 11  . sacubitril-valsartan (ENTRESTO) 24-26 MG Take 1 tablet by mouth 2 (two) times daily. 60 tablet 3  . spironolactone (ALDACTONE) 25 MG tablet Take 0.5 tablets (12.5 mg total) by mouth daily. 90 tablet 3   No current facility-administered medications for this encounter.     No Known Allergies   Vitals:   11/05/16 1501  BP: 124/68  Pulse: 62  SpO2: 99%  Weight: (!) 304 lb 12 oz (138.2 kg)   Wt Readings from Last 3 Encounters:  11/05/16 (!) 304 lb 12 oz (138.2 kg)  10/17/16 291 lb 3.2 oz (132.1 kg)  10/09/16 295 lb (133.8 kg)     PHYSICAL EXAM: General:  Obese. Sitting in [place]. No respiratory difficulty, daughter present HEENT: normal Neck: Thick.  No JVD. Carotids 2+ bilat; no bruits. No thyromegaly or nodule noted. Cor: PMI nonpalpable. Regular rate & rhythm. 2/6 early SEM RUSB with clear S2.  No S3/S4. Lungs: CTAB, normal effort.  Abdomen: obese, soft, non-tender, mildly distended, no HSM. No bruits or masses. +BS  Extremities: no cyanosis, clubbing, rash. No edema.  Neuro: alert & oriented x 3, cranial nerves grossly intact. moves all 4 extremities w/o difficulty. Affect pleasant.  ASSESSMENT & PLAN: 1. Chronic systolic CHF: Nonischemic cardiomyopathy.  Last echo 9/16 with EF 35-40%. Out of range for ICD. Interestingly, EF was reported as 52% on 6/17 Cardiolite (no ischemia or  infarction).  NYHA II-III symptoms but very limited by back and knee pain so hard to tell how limited she truly is from cardiac perspective.  - Continue Lasix 80 mg bid. BMET/BNP today.   - Continue spironolactone 12.5 mg daily.   - Continue Entresto 24/26 mg BID.  - Continue Coreg 25 mg BID 2. OSA: Not using CPAP, should followup with sleep medicine.  3. Obesity: She asks about using orlistat.  I think that would be ok.  4. Chest pain: Atypical.  - Lexiscan cardiolite stress test 01/24/16 without ischemia or infarction.  5. Hyperlipidemia: Continue Crestor.    6. CKD: BMET today.  7. HTN: BP controlled.   Followup in 4 months.  Loralie Champagne 11/06/2016

## 2016-11-14 ENCOUNTER — Encounter: Payer: Self-pay | Admitting: Internal Medicine

## 2016-11-16 DIAGNOSIS — G4737 Central sleep apnea in conditions classified elsewhere: Secondary | ICD-10-CM | POA: Diagnosis not present

## 2016-11-16 DIAGNOSIS — J9690 Respiratory failure, unspecified, unspecified whether with hypoxia or hypercapnia: Secondary | ICD-10-CM | POA: Diagnosis not present

## 2016-11-16 DIAGNOSIS — G4733 Obstructive sleep apnea (adult) (pediatric): Secondary | ICD-10-CM | POA: Diagnosis not present

## 2016-11-21 ENCOUNTER — Ambulatory Visit (INDEPENDENT_AMBULATORY_CARE_PROVIDER_SITE_OTHER): Payer: Medicare HMO | Admitting: Internal Medicine

## 2016-11-21 VITALS — BP 121/83 | HR 51 | Temp 98.0°F | Ht 63.0 in | Wt 306.9 lb

## 2016-11-21 DIAGNOSIS — Z026 Encounter for examination for insurance purposes: Secondary | ICD-10-CM | POA: Diagnosis not present

## 2016-11-21 DIAGNOSIS — M25562 Pain in left knee: Secondary | ICD-10-CM

## 2016-11-21 DIAGNOSIS — Z6841 Body Mass Index (BMI) 40.0 and over, adult: Secondary | ICD-10-CM

## 2016-11-21 DIAGNOSIS — F3289 Other specified depressive episodes: Secondary | ICD-10-CM

## 2016-11-21 DIAGNOSIS — M25561 Pain in right knee: Secondary | ICD-10-CM

## 2016-11-21 DIAGNOSIS — F329 Major depressive disorder, single episode, unspecified: Secondary | ICD-10-CM | POA: Diagnosis not present

## 2016-11-21 DIAGNOSIS — M545 Low back pain: Secondary | ICD-10-CM | POA: Diagnosis not present

## 2016-11-21 DIAGNOSIS — Z993 Dependence on wheelchair: Secondary | ICD-10-CM

## 2016-11-21 DIAGNOSIS — Z79891 Long term (current) use of opiate analgesic: Secondary | ICD-10-CM

## 2016-11-21 DIAGNOSIS — G8929 Other chronic pain: Secondary | ICD-10-CM | POA: Diagnosis not present

## 2016-11-21 DIAGNOSIS — I5042 Chronic combined systolic (congestive) and diastolic (congestive) heart failure: Secondary | ICD-10-CM

## 2016-11-21 MED ORDER — ORLISTAT 120 MG PO CAPS: 120.0000 mg | ORAL_CAPSULE | Freq: Three times a day (TID) | ORAL | 2 refills | Status: DC

## 2016-11-21 NOTE — Patient Instructions (Signed)
General Instructions: - Start Orlistat 120 mg three times daily. Use only when eating meals that have fatty foods. - Will get in paperwork for power wheelchair  Please bring your medicines with you each time you come to clinic.  Medicines may include prescription medications, over-the-counter medications, herbal remedies, eye drops, vitamins, or other pills.   Progress Toward Treatment Goals:  Treatment Goal 02/22/2015  Hemoglobin A1C at goal  Blood pressure at goal  Prevent falls -    Self Care Goals & Plans:  Self Care Goal 05/29/2016  Manage my medications take my medicines as prescribed; bring my medications to every visit; refill my medications on time  Monitor my health keep track of my blood pressure  Eat healthy foods eat more vegetables; eat foods that are low in salt; eat baked foods instead of fried foods  Be physically active find an activity I enjoy  Prevent falls -    No flowsheet data found.   Care Management & Community Referrals:  Referral 06/02/2013  Referrals made to community resources weight management; exercise/physical therapy; nutrition

## 2016-11-21 NOTE — Progress Notes (Signed)
CC: Power wheelchair assessment  HPI:  [name] is a 66 y.o. woman with past medical history as noted below who presents today for a power wheelchair assessment.  Power wheelchair assessment: Patient is limited in her ability to participate in mobility related activities of daily living due to her chronic combined systolic and diastolic heart failure, morbid obesity (BMI 54.5), and severe low back pain and bilateral knee pain. She is specifically limited in her ability to cook, clean, and obtain groceries, which is very important as her husband is disabled and she must provide for both of them. A power wheelchair would allow her to complete these activities in an easier manner. She can ambulate with a walker, but only for short distances such as to use the restroom due to her severe chronic low back pain and her CHF. She cannot use a manual wheelchair as she develops shortness of breath and occasional chest pain when exerting herself due to her CHF. She has to make frequent stops anytime she is exerting herself due to her shortness of breath, chest pain, and severe low back pain. A scooter would be inappropriate for her as this provides no back support and would likely contribute further to her back pain. Patient has both the physical and mental ability to operate a power wheelchair safely in her home and she is highly motivated and willing to use the power wheelchair to aid her in her mobile activities of daily living.   Depression: Patient reports she saw her psychiatrist recently and was started on mirtazapine. She has noticed significant weight gain and hunger since starting this medication. Her trazodone has been stopped. She has gained 10 pounds since her last clinic visit. She is interested in starting orlistat for weight loss and has used this medication successfully in the past.   Past Medical History:  Diagnosis Date  . Acute respiratory failure with hypoxia ([place]) 09/26/2012  .  Bilateral knee pain   . Cellulitis of lower leg    left  . Chronic combined systolic and diastolic congestive heart failure ([place]) 10/10/2012   2 D echo 09/2012:  Left ventricle: Extremely poor acoustic windows limit study. Overall LVEF appears to be severely depressed. Recomm ordering limited study with contrast to further evaluate LV systolic function. The cavity size was normal. Wall thickness was normal. Doppler parameters are consistent with abnormal left ventricular relaxation (grade 1 diastolic dysfunction).   2 D echo 03/2009 :  Left ventricle: The cavity size was normal. There was mild concentric hypertrophy. Systolic function was mildly reduced. The estimated ejection fraction was in the range of 45% to 50%. Diffuse hypokinesis. Doppler parameters are consistent with abnormal left ventricular relaxation (grade 1 diastolic dysfunction).     . Chronic kidney disease 10/10/2012   Stage 2 with GFR of 64   . Depression   . Diabetes mellitus   . Gout   . H/O: hysterectomy   . Hyperlipidemia   . Hypertension   . Malignant hypertension 09/26/2012  . Morbid obesity ([place])   . Nonischemic cardiomyopathy ([place])   . Sleep apnea    on cpap  . Unspecified essential hypertension 03/22/2009   Qualifier: Diagnosis of  By: Karrie Meres, RN, BSN, [name]      Review of Systems:   General: Denies fever, chills, night sweats, changes in appetite HEENT: Denies headaches, ear pain, changes in vision, rhinorrhea, sore throat CV: Denies CP, palpitations, SOB, orthopnea Pulm: Denies SOB, cough, wheezing GI: Denies abdominal pain, nausea, vomiting,  diarrhea, constipation, melena, hematochezia GU: Denies dysuria, hematuria, frequency Msk: Denies muscle cramps, joint pains Neuro: Denies weakness, numbness, tingling Skin: Denies rashes, bruising Psych: Denies anxiety, hallucinations  Physical Exam:  Vitals:   11/21/16 1317  BP: 121/83  Pulse: (!) 51  Temp: 98 F (36.7 C)  TempSrc: Oral  SpO2: 99%  Weight: (!)  306 lb 14.4 oz (139.2 kg)  Height: _0  (1.6 m)   General: Morbidly obese woman sitting up, NAD CV: RRR, no m/g/r Pulm: CTA bilaterally, breaths non-labored Ext: warm, no peripheral edema  Neuro: alert and oriented x 3. Strength 5/5 in upper and lower extremities bilaterally.  Assessment & Plan:   See Encounters Tab for problem based charting.  Patient discussed with Dr. Dareen Piano

## 2016-11-26 ENCOUNTER — Encounter: Payer: Self-pay | Admitting: Internal Medicine

## 2016-11-26 NOTE — Assessment & Plan Note (Signed)
Power wheelchair assessment performed. This will help patient by allowing her to cook and grocery shop more easily for her and her family. She has a disabled husband and must take care of him. She is limited by her CHF, morbid obesity, and chronic low back pain and chronic bilateral knee pain. She is unable to use a walker or scooter due to these medical conditions.

## 2016-11-26 NOTE — Assessment & Plan Note (Signed)
Her Trazodone was recently changed by her psychiatrist to [place]. Patient has noticed significant weight gain. Discussed with patient to talk with her psychiatrist about these concerns. Will have her start Orlistat as she has been successful with weight loss in the past with this medication. We discussed the side effects of this medication extensively.

## 2016-11-27 ENCOUNTER — Encounter: Payer: Medicare HMO | Admitting: Internal Medicine

## 2016-11-27 NOTE — Progress Notes (Signed)
Internal Medicine Clinic Attending  Case discussed with Dr. Rivet soon after the resident saw the patient.  We reviewed the resident's history and exam and pertinent patient test results.  I agree with the assessment, diagnosis, and plan of care documented in the resident's note.  

## 2016-11-28 ENCOUNTER — Ambulatory Visit (INDEPENDENT_AMBULATORY_CARE_PROVIDER_SITE_OTHER): Payer: Medicare HMO | Admitting: Psychiatry

## 2016-11-28 ENCOUNTER — Encounter (HOSPITAL_COMMUNITY): Payer: Self-pay | Admitting: Psychiatry

## 2016-11-28 VITALS — BP 132/78 | HR 75 | Ht 63.0 in | Wt 312.8 lb

## 2016-11-28 DIAGNOSIS — Z79899 Other long term (current) drug therapy: Secondary | ICD-10-CM

## 2016-11-28 DIAGNOSIS — F332 Major depressive disorder, recurrent severe without psychotic features: Secondary | ICD-10-CM | POA: Diagnosis not present

## 2016-11-28 DIAGNOSIS — Z7982 Long term (current) use of aspirin: Secondary | ICD-10-CM | POA: Diagnosis not present

## 2016-11-28 MED ORDER — HYDROXYZINE PAMOATE 25 MG PO CAPS: 25.0000 mg | ORAL_CAPSULE | Freq: Every day | ORAL | 0 refills | Status: DC

## 2016-11-28 NOTE — Progress Notes (Signed)
[place] MD/PA/NP OP Progress Note  11/28/2016 12:20 PM Robin Hardin  MRN:  [id number]  Chief Complaint: weight gain from medicine, depression ongoing  Subjective:  Robin Hardin presents today for psychiatric med management follow-up.  She has gained approximately 20 pounds in the last 2 months with Remeron. Has had a significant increase in appetite.  She shares that this is been very difficult for her, as she is trying to lose weight. We agreed to taper Remeron to 7.5 for 3 days, then discontinue. We reviewed the risks and benefits of starting Vistaril for sleep as needed. She shares that her stress has been slightly reduced, as her daughter and son-in-law have moved out. Her husband stood up for her with the kids being very disrespectful, and they moved out of the house about 3 weeks ago. It is been much less stressful and chaotic in the house.  Regarding her depression, Robin Hardin reports that she continues to struggle with depression symptoms. She feels that this is been impairing her ability to get active, to lose weight, she has trouble enjoying activities that she typically likes, she often feels like a failure and a disappointment, she has difficulty sleeping due to rumination, she has low concentration and energy. We completed a PHQ9 which scored a 21.  Spent time with the patient reviewing her past medication trials. She reports that she has been tried on Lexapro for a couple years, Zoloft for 2 years, Celexa for 1 year, Prozac, currently at 80 mg, for the past 3-4 years, now Remeron for 2 months, Wellbutrin, and Paxil. She is also been tried on Effexor. She reports that many of these medications gave her substantial side effects, weight gain, increased anxiety with Wellbutrin, and she is frustrated at her experience with medications. She denies any suicidal thoughts.  She agrees to remain on Prozac 80 mg, her current dose. However she would like improvement and help with her depression.  I spent time discussing the use of [place], and the risks and fits associated with [place]. Reviewed the risk of seizures, the risks of pain and discomfort, and the risk of trigeminal nerve stimulation. The patient confirms that she does not have any dental implants, metallic objects, no neuro stimulators, no aneurysm clip, no bullet fragments in her head, no pacemaker other cardiac device. She reports that she has never had any surgeries. I spent time teaching her about the process of [place], and what to expect with treatment. We spent time discussing the pathology of how [place] may improve depression, and the rates of remission. She was able to provide informed consent. She wishes to proceed with an initial brain mapping for MT and treatment, and understands that the treatment requires 6 weeks of daily follow-up during the weekdays.   Visit Diagnosis:    ICD-9-CM ICD-10-CM   1. Severe episode of recurrent major depressive disorder, without psychotic features ([place]) 296.33 F33.2     Past Psychiatric History: See intake H&P for full details. Reviewed, with no updates at this time.  Past Medical History:  Past Medical History:  Diagnosis Date  . Acute respiratory failure with hypoxia ([place]) 09/26/2012  . Bilateral knee pain   . Cellulitis of lower leg    left  . Chronic combined systolic and diastolic congestive heart failure ([place]) 10/10/2012   2 D echo 09/2012:  Left ventricle: Extremely poor acoustic windows limit study. Overall LVEF appears to be severely depressed. Recomm ordering limited study with contrast to further evaluate LV systolic function. The  cavity size was normal. Wall thickness was normal. Doppler parameters are consistent with abnormal left ventricular relaxation (grade 1 diastolic dysfunction).   2 D echo 03/2009 :  Left ventricle: The cavity size was normal. There was mild concentric hypertrophy. Systolic function was mildly reduced. The estimated ejection fraction was in the range of 45% to 50%.  Diffuse hypokinesis. Doppler parameters are consistent with abnormal left ventricular relaxation (grade 1 diastolic dysfunction).     . Chronic kidney disease 10/10/2012   Stage 2 with GFR of 64   . Depression   . Diabetes mellitus   . Gout   . H/O: hysterectomy   . Hyperlipidemia   . Hypertension   . Malignant hypertension 09/26/2012  . Morbid obesity ([employer])   . Nonischemic cardiomyopathy ([place])   . Sleep apnea    on cpap  . Unspecified essential hypertension 03/22/2009   Qualifier: Diagnosis of  By: Karrie Meres RN, BSN, [name]     History reviewed. No pertinent surgical history.  Family Psychiatric History: See intake H&P for full details. Reviewed, with no updates at this time.   Family History:  Family History  Problem Relation Age of Onset  . Asthma Mother   . Heart disease Mother   . Stroke Mother     clotting disorders  . Asthma Brother   . Asthma Brother     Social History:  Social History   Social History  . Marital status: Married    Spouse name: N/A  . Number of children: 2  . Years of education: N/A   Occupational History  . retired cna Unemployed   Social History Main Topics  . Smoking status: Never Smoker  . Smokeless tobacco: Never Used  . Alcohol use No  . Drug use: No  . Sexual activity: Not Asked   Other Topics Concern  . None   Social History Narrative  . None    Allergies: No Known Allergies  Metabolic Disorder Labs: Lab Results  Component Value Date   HGBA1C 5.5 11/08/2015   MPG 123 (H) 09/26/2012   MPG 148 (H) 05/26/2010   No results found for: PROLACTIN Lab Results  Component Value Date   CHOL 196 06/11/2016   TRIG 81 06/11/2016   HDL 58 06/11/2016   CHOLHDL 3.4 06/11/2016   VLDL 16 06/11/2016   LDLCALC 122 (H) 06/11/2016   LDLCALC 221 (H) 01/11/2016     Current Medications: Current Outpatient Prescriptions  Medication Sig Dispense Refill  . allopurinol (ZYLOPRIM) 300 MG tablet Take 1 tablet (300 mg total) by mouth daily.  90 tablet 1  . ALOE PO Take 1 capsule by mouth 2 (two) times daily.    Marland Kitchen aspirin EC 81 MG EC tablet Take 1 tablet (81 mg total) by mouth daily. 30 tablet 2  . carvedilol (COREG) 25 MG tablet Take 1 tablet (25 mg total) by mouth 2 (two) times daily with a meal. 180 tablet 1  . ferrous sulfate 325 (65 FE) MG EC tablet Take 325 mg by mouth daily with breakfast.    . FLUoxetine (PROZAC) 40 MG capsule Take 2 capsules (80 mg total) by mouth daily. 60 capsule 5  . furosemide (LASIX) 80 MG tablet Take 80 mg by mouth 2 (two) times daily.    Marland Kitchen HYDROcodone-acetaminophen (NORCO/VICODIN) 5-325 MG tablet Take 1 tablet by mouth every 6 (six) hours as needed for severe pain. 90 tablet 0  . orlistat (XENICAL) 120 MG capsule Take 1 capsule (120 mg total) by mouth  3 (three) times daily with meals. 90 capsule 2  . potassium chloride SA (K-DUR,KLOR-CON) 20 MEQ tablet     . rosuvastatin (CRESTOR) 20 MG tablet Take 1 tablet (20 mg total) by mouth daily. 30 tablet 11  . sacubitril-valsartan (ENTRESTO) 24-26 MG Take 1 tablet by mouth 2 (two) times daily. 60 tablet 3  . spironolactone (ALDACTONE) 25 MG tablet Take 0.5 tablets (12.5 mg total) by mouth daily. 90 tablet 3  . hydrOXYzine (VISTARIL) 25 MG capsule Take 1 capsule (25 mg total) by mouth at bedtime. 90 capsule 0   No current facility-administered medications for this visit.     Neurologic: Headache: Negative Seizure: Negative Paresthesias: Negative  Musculoskeletal: Strength & Muscle Tone: within normal limits Gait & Station: normal Patient leans: N/A  Psychiatric Specialty Exam: Review of Systems  Constitutional: Negative.   HENT: Negative.   Respiratory: Negative.   Cardiovascular: Negative.   Gastrointestinal: Negative.   Musculoskeletal: Positive for back pain.  Neurological: Negative for tingling, tremors, seizures and headaches.  Psychiatric/Behavioral: Positive for depression. The patient is nervous/anxious and has insomnia.     Blood  pressure 132/78, pulse 75, height 5' 3" (1.6 m), weight (!) 312 lb 12.8 oz (141.9 kg).Body mass index is 55.41 kg/m.  General Appearance: Casual and Fairly Groomed  Eye Contact:  Fair  Speech:  Normal Rate  Volume:  Normal  Mood:  Depressed and Dysphoric  Affect:  Congruent and Depressed  Thought Process:  Coherent and Goal Directed  Orientation:  Full (Time, Place, and Person)  Thought Content: Logical   Suicidal Thoughts:  No  Homicidal Thoughts:  No  Memory:  Immediate;   Fair  Judgement:  Good  Insight:  Good  Psychomotor Activity:  Normal  Concentration:  Concentration: Fair  Recall:  NA  Fund of Knowledge: Fair  Language: Fair  Akathisia:  Negative  Handed:  Left  AIMS (if indicated):  na  Assets:  Communication Skills Desire for Improvement Financial Resources/Insurance Housing Social Support Transportation  ADL's:  Intact  Cognition: WNL  Sleep:  4-5 hours, restless, early AM awakenings   Treatment Plan Summary: Robin Hardin is a 66 year old female with a psychiatric history of major depressive disorder. She currently presents with ongoing symptoms of severe major depression with a PHQ of 21 at this time.  She has been tried on Effexor, Lexapro, Zoloft, Celexa, Remeron, Wellbutrin, Paxil for at least 6 months on all these medications over the course of many years. She is currently on Prozac 80 mg, and continues to struggle with depressive symptoms.  Most recently, she was able to sleep better with Remeron, but increased her weight by 20 pounds over the course of 2 months. The side effects of been intolerable, so we will discontinue Remeron. The patient appears to be a good candidate for [facility], and I reviewed the risks and benefits of this therapy with her. We will proceed with the next steps to schedule the patient for an initial motor threshold determination and initial treatment.  1. Severe episode of recurrent major depressive disorder, without psychotic features  ([place])    Continue Prozac 80 mg daily Remeron tapered to 7.5 mg for 3 days, then discontinue Vistaril 25 mg nightly when necessary for sleep Reviewed the risks and benefits of [place] Patient does not have any medical or implanted device contraindications for treatment with [place] Patient will be tentatively scheduled for brain mapping for motor threshold and initial [place] treatment on April 20 with this writer  Sheppard Coil  Vallarie Mare, MD 11/28/2016, 12:20 PM

## 2016-11-29 NOTE — Telephone Encounter (Signed)
[place] Ropes can you please close this encounter

## 2016-12-06 ENCOUNTER — Telehealth: Payer: Self-pay | Admitting: Internal Medicine

## 2016-12-06 NOTE — Telephone Encounter (Signed)
Pt stated she can not pay $1700 for 270 pills with [employer] and is not covered. Medicine requires an authorization and she needs to know what to do about her orlistat (XENICAL) 120 MG capsule.

## 2016-12-10 ENCOUNTER — Telehealth: Payer: Self-pay | Admitting: Pharmacist

## 2016-12-10 NOTE — Telephone Encounter (Signed)
Thank you for those suggestions, Dr. Maudie Mercury. I was also wondering if she could afford OTC Alli. We can discuss with her once able to get ahold of her by phone. I do not want to change her psych medications as she is following with Psychiatry to control her major depression and looks like they want her to continue on Fluoxetine.

## 2016-12-10 NOTE — Telephone Encounter (Signed)
I was wondering if patient can afford the OTC orlistat (Alli). It costs around $50 per month. I tried calling patient but unable to reach, will try again.  I checked to see if bupropion is covered and it is tier 3 on her insurance plan, but not sure whether she can afford or whether it would be worth switching her from the fluoxetine. Just wanted to share.

## 2016-12-25 ENCOUNTER — Encounter: Payer: Self-pay | Admitting: Internal Medicine

## 2016-12-25 ENCOUNTER — Ambulatory Visit (INDEPENDENT_AMBULATORY_CARE_PROVIDER_SITE_OTHER): Payer: Medicare HMO | Admitting: Internal Medicine

## 2016-12-25 VITALS — BP 155/95 | HR 62 | Temp 98.1°F | Ht 63.0 in | Wt 312.5 lb

## 2016-12-25 DIAGNOSIS — Z6841 Body Mass Index (BMI) 40.0 and over, adult: Secondary | ICD-10-CM

## 2016-12-25 DIAGNOSIS — M25561 Pain in right knee: Secondary | ICD-10-CM

## 2016-12-25 DIAGNOSIS — G8929 Other chronic pain: Secondary | ICD-10-CM

## 2016-12-25 DIAGNOSIS — M25562 Pain in left knee: Secondary | ICD-10-CM | POA: Diagnosis not present

## 2016-12-25 DIAGNOSIS — I110 Hypertensive heart disease with heart failure: Secondary | ICD-10-CM

## 2016-12-25 DIAGNOSIS — I10 Essential (primary) hypertension: Secondary | ICD-10-CM

## 2016-12-25 DIAGNOSIS — I5042 Chronic combined systolic (congestive) and diastolic (congestive) heart failure: Secondary | ICD-10-CM | POA: Diagnosis not present

## 2016-12-25 DIAGNOSIS — G4733 Obstructive sleep apnea (adult) (pediatric): Secondary | ICD-10-CM

## 2016-12-25 DIAGNOSIS — Z79891 Long term (current) use of opiate analgesic: Secondary | ICD-10-CM

## 2016-12-25 DIAGNOSIS — Z79899 Other long term (current) drug therapy: Secondary | ICD-10-CM | POA: Diagnosis not present

## 2016-12-25 DIAGNOSIS — F329 Major depressive disorder, single episode, unspecified: Secondary | ICD-10-CM | POA: Diagnosis not present

## 2016-12-25 DIAGNOSIS — F332 Major depressive disorder, recurrent severe without psychotic features: Secondary | ICD-10-CM

## 2016-12-25 MED ORDER — FUROSEMIDE 80 MG PO TABS: 80.0000 mg | ORAL_TABLET | Freq: Two times a day (BID) | ORAL | 5 refills | Status: DC

## 2016-12-25 MED ORDER — LIRAGLUTIDE 18 MG/3ML ~~LOC~~ SOPN: PEN_INJECTOR | SUBCUTANEOUS | 2 refills | Status: DC

## 2016-12-25 NOTE — Patient Instructions (Addendum)
General Instructions: - Start Liraglutide 0.6 mg daily for 1 week. Then increase to 1.2 mg daily for 1 week. Then increase to 1.8 mg daily. Goal will be to increase to 3 mg daily.  - Will need to fill your pain medication prescriptions each month. I think we need to consider decreasing your pill amount if you are able to go weeks without the medication and are having memory issues.  - Follow up with Dr. Sharyon Medicus to discuss brain stimulation procedure  Please bring your medicines with you each time you come to clinic.  Medicines may include prescription medications, over-the-counter medications, herbal remedies, eye drops, vitamins, or other pills.   Progress Toward Treatment Goals:  Treatment Goal 02/22/2015  Hemoglobin A1C at goal  Blood pressure at goal  Prevent falls -    Self Care Goals & Plans:  Self Care Goal 12/25/2016  Manage my medications take my medicines as prescribed; bring my medications to every visit; refill my medications on time  Monitor my health keep track of my blood pressure  Eat healthy foods eat more vegetables; eat foods that are low in salt; eat baked foods instead of fried foods; eat smaller portions  Be physically active find an activity I enjoy  Prevent falls -    No flowsheet data found.   Care Management & Community Referrals:  Referral 06/02/2013  Referrals made to community resources weight management; exercise/physical therapy; nutrition

## 2016-12-25 NOTE — Progress Notes (Signed)
CC: Follow up for hypertension  HPI:  [name] is a 66 y.o. woman with PMHx as noted below who presents today for follow up of her hypertension.  HTN: BP 155/92. Repeat BP 155/95. Patient reports she just took her medications before she came to her appointment. She is on Coreg 25 mg BID, Spironolactone 12.5 mg daily, and Entresto 24-26 BID.  Diastolic CHF: Last echo in Sept 2016 showed an EF of 35-40% with diffuse hypokinesis and grade 2 diastolic dysfunction. She had a Myoview in June 2017 that actually showed improvement of her EF to 52%. She reports some shortness of breath when walking up stairs but this is not unusual for her. She denies any chest pain, orthopnea, or leg swelling. She recently had an appointment with Dr. Aundra Dubin and advised to continue her Lasix 80 mg BID which she has been doing. She is also on Coreg and Entresto.   OSA: At her last visit she had noted she needed new CPAP supplies which were ordered. Today she states the person who came out to her house to help set up her CPAP machine with the new supplies told her that she needed an entirely new machine. She has not been using her current machine due to this.   Morbid Obesity: Weight has increased from her last visit on 4/4 from 306 lbs to 312 lbs. Her initial weight gain had been attributed to a change in her psychiatric medications, but her Mirtazepine was discontinued 3 weeks ago. She admits she has been eating more carbs lately, including rice and bread. She is very frustrated with her weight and had expressed interest in starting Orlistat. This was prescribed at her last visit but she was unable to get this medication due to cost (said it would be $1700/month). She was offered to try Alli which is OTC Orlistat ($50/month) but she states she cannot afford this option.   Chronic Knee/Back Pain on Opioids: Reports having increased pain in her back and knees as she has not been taking her pain medication for the  last 3 weeks. She states she has not filled 2 of 3 pain prescriptions that she received at her last visit in February. She reports "forgetting" to fill her prescriptions when she is out of the house running errands and by the time she remembers she is already home and in too much pain to go get them. She denies taking any OTC medications to help with pain relief. She has been "suffering through the pain." When she does have her Hydrocodone-acetaminophen she typically takes 2 pills per day.   Depression: PHQ-9 score has improved from 21 to 10 today. She has been seeing Dr. Sharyon Medicus with psychiatry. She reports her Mirtazepine was stopped due to weight gain and she was started on Vistaril for sleep. She is still taking Prozac 80 mg daily. She reports plans were made to do [place] with Dr. Sharyon Medicus but she does not know when this will occur.  Past Medical History:  Diagnosis Date  . Acute respiratory failure with hypoxia ([place]) 09/26/2012  . Bilateral knee pain   . Cellulitis of lower leg    left  . Chronic combined systolic and diastolic congestive heart failure ([place]) 10/10/2012   2 D echo 09/2012:  Left ventricle: Extremely poor acoustic windows limit study. Overall LVEF appears to be severely depressed. Recomm ordering limited study with contrast to further evaluate LV systolic function. The cavity size was normal. Wall thickness was normal. Doppler parameters are  consistent with abnormal left ventricular relaxation (grade 1 diastolic dysfunction).   2 D echo 03/2009 :  Left ventricle: The cavity size was normal. There was mild concentric hypertrophy. Systolic function was mildly reduced. The estimated ejection fraction was in the range of 45% to 50%. Diffuse hypokinesis. Doppler parameters are consistent with abnormal left ventricular relaxation (grade 1 diastolic dysfunction).     . Chronic kidney disease 10/10/2012   Stage 2 with GFR of 64   . Depression   . Diabetes mellitus   . Gout   . H/O: hysterectomy   .  Hyperlipidemia   . Hypertension   . Malignant hypertension 09/26/2012  . Morbid obesity ([place])   . Nonischemic cardiomyopathy ([place])   . Sleep apnea    on cpap  . Unspecified essential hypertension 03/22/2009   Qualifier: Diagnosis of  By: Karrie Meres, RN, BSN, [name]      Review of Systems:   All negative except per HPI  Physical Exam:  Vitals:   12/25/16 1320  BP: (!) 155/92  Pulse: 60  Temp: 98.1 F (36.7 C)  TempSrc: Oral  SpO2: 100%  Weight: (!) 312 lb 8 oz (141.7 kg)  Height: _0  (1.6 m)   Repeat BP 155/95  General: Morbidly obese woman in NAD CV: RRR, no m/g/r Pulm: CTA bilaterally, breaths non-labored Ext: warm, no edema, moves all  Assessment & Plan:   See Encounters Tab for problem based charting.  Patient discussed with Dr. Angelia Mould

## 2016-12-26 NOTE — Assessment & Plan Note (Signed)
Her PHQ-9 score has significantly improved since she has started following with outpatient psych. Mirtazepine has been stopped. Vistaril started for sleep at nighttime, she reports this is helping. Continue Prozac 80 mg daily. She may undergo TMS in the near future, will need to follow this up.

## 2016-12-26 NOTE — Assessment & Plan Note (Signed)
Robin Hardin is on chronic opioid therapy for chronic pain. The date of the controlled substances contract is referenced in the [facility] and / or the overview. Date of pain contract was 05/29/16. As part of the treatment plan, the [place] controlled substance database is checked at least twice yearly and the database results are appropriate based on the patient's report that she did not fill her last 2 prescriptions. I have last reviewed the results on 12/25/16.   The last UDS was on 09/25/16 and results are as expected. Patient needs at least a yearly UDS.   The patient is on hydrocodone/acetaminophen (Lorcet, Lortab, Norco, Vicodin) strength 5-325 mg, 90 per 30 days. Adjunctive treatment includes Mental health / counseling. This regimen allows Robin Hardin to function and does not cause excessive sedation or other side effects.  Since the patient has not filled her last 2 prescriptions and reported her last dose of hydrocodone-acetaminophen was 3 weeks ago, I suspect she is not taking this medication as often as she reports. I also question whether she really needs opioid therapy to manage her pain if she was able to go 3 weeks without taking it. Her forgetfulness is also concerning and opioids are likely not helping this situation. We had a long discussion about my concerns about her pain medication. We agreed that when she does require a new prescription that we will decrease the pill amount to 60 tablets per month. She will need continued discussions on this issue in the future and possibly could be titrated off opioids if able to use other non-opioid pain medications. We also discussed that weight loss would significantly help her joint pains in addition to all of the other health benefits.   Interventions today include: - Decrease next refill of hydrocodone-acetaminophen to 60 tablets per month - Would consider adding other non-opioid therapies, including voltaren gel, Cymbalta (would need to coordinate  with psych), Tylenol, to see if opioids can be decreased further- we did not get to discuss this today as I did not want to make too many changes, especially as she agreed to decreasing her hydrocodone pill amount - Encouraged weight loss- this is likely the culprit for her pains - Continue depression management with psych as also another contributor to her pain

## 2016-12-26 NOTE — Assessment & Plan Note (Signed)
Her weight has been trending up since February: 295>304 (March)>306 (April)>312 today. Some of this weight gain could of been related to Mirtazepine as she reported increased appetite/hunger while on this medication. However, her weight has continued to climb despite this medication being discontinued. We had a long discussion on the importance of a healthy diet and eating less carbohydrates. She will make efforts to cut down on carbs and incorporate more vegetables. She declined a referral to nutrition education. Since she was very adamant about starting a medication for weight loss and Orlistat is too expensive, we discussed starting Liraglutide. She will start at 0.6 mg daily for 1 week and then titrate up by 0.6 mg per week as tolerated to get to goal dose of 3 mg daily. She does have a hx of diabetes so would recheck an A1c at next visit given her weight gain.

## 2016-12-26 NOTE — Assessment & Plan Note (Signed)
Weight is up 6 lbs from last visit but this does not seem to be fluid related, more likely due to dietary indiscretion. She reports SOB with going up stairs but this is not unusual for her. Will have her continue Lasix 80 mg BID. Advised her to return to clinic if she develops chest discomfort or orthopnea that she typically gets when she is fluid overloaded. Also emphasized importance of weight loss and need for improvement in her dietary intake.

## 2016-12-26 NOTE — Assessment & Plan Note (Addendum)
BP elevated today which is unusual. Her BP typically is in 95-320 systolic range. Could be related to her significant weight gain. She does not appear significantl volume overloaded but difficult exam with her obesity. Will have her continue her current regimen and follow closely.

## 2016-12-26 NOTE — Assessment & Plan Note (Signed)
Order placed for a new CPAP machine but she likely will require a new sleep study as her last one was in 2010. She should also reestablish with Pulmonology/Sleep medicine (previously followed with Dr. Elsworth Soho). Will see if can get machine ordered and discuss referral at her next visit.

## 2017-01-01 NOTE — Progress Notes (Signed)
Internal Medicine Clinic Attending  Case discussed with Dr. Arcelia Jew at the time of the visit.  We reviewed the resident's history and exam and pertinent patient test results.  I agree with the assessment, diagnosis, and plan of care documented in the resident's note.

## 2017-01-23 ENCOUNTER — Encounter (HOSPITAL_COMMUNITY): Payer: Self-pay | Admitting: Psychiatry

## 2017-01-23 ENCOUNTER — Ambulatory Visit (INDEPENDENT_AMBULATORY_CARE_PROVIDER_SITE_OTHER): Payer: Medicare HMO | Admitting: Psychiatry

## 2017-01-23 DIAGNOSIS — F329 Major depressive disorder, single episode, unspecified: Secondary | ICD-10-CM

## 2017-01-23 DIAGNOSIS — F32A Depression, unspecified: Secondary | ICD-10-CM

## 2017-01-23 MED ORDER — BUPROPION HCL ER (SR) 100 MG PO TB12: 100.0000 mg | ORAL_TABLET | Freq: Two times a day (BID) | ORAL | 3 refills | Status: DC

## 2017-01-23 NOTE — Progress Notes (Signed)
[facility] MD/PA/NP OP Progress Note  01/23/2017 11:13 AM Robin Hardin  MRN:  [id number]  Chief Complaint:  Chief Complaint    Follow-up     Subjective:  Robin Hardin prevents for follow-up. She was denied for [facility] treatment for her depression, as her insurance would not reimburse for this. We discussed alternative options, including adding Wellbutrin as an augmenting agent. She continues to struggle with psychomotor depressive symptoms, overeating, poor motivation and energy, and difficulty thinking and remembering her tasks for the day.  She denies any suicidal thoughts or unsafe thoughts. She reports that she has been tried on Wellbutrin in the past, but is willing to retry this. We agreed to start at 100 mg SR daily, and increase to twice daily as tolerated..  She is currently sleeping about 8 hours a day, but her sleep cycle shifted to 3 AM to 11 AM. I suggested that she set an alarm for 8 AM to start Wellbutrin in the morning.  Visit Diagnosis:    ICD-10-CM   1. Depression, unspecified depression type F32.9 buPROPion (WELLBUTRIN SR) 100 MG 12 hr tablet    Past Psychiatric History: See intake H&P for full details. Reviewed, with no updates at this time.   Past Medical History:  Past Medical History:  Diagnosis Date  . Acute respiratory failure with hypoxia ([facility]) 09/26/2012  . Bilateral knee pain   . Cellulitis of lower leg    left  . Chronic combined systolic and diastolic congestive heart failure ([facility]) 10/10/2012   2 D echo 09/2012:  Left ventricle: Extremely poor acoustic windows limit study. Overall LVEF appears to be severely depressed. Recomm ordering limited study with contrast to further evaluate LV systolic function. The cavity size was normal. Wall thickness was normal. Doppler parameters are consistent with abnormal left ventricular relaxation (grade 1 diastolic dysfunction).   2 D echo 03/2009 :  Left ventricle: The cavity size was normal. There was mild concentric hypertrophy.  Systolic function was mildly reduced. The estimated ejection fraction was in the range of 45% to 50%. Diffuse hypokinesis. Doppler parameters are consistent with abnormal left ventricular relaxation (grade 1 diastolic dysfunction).     . Chronic kidney disease 10/10/2012   Stage 2 with GFR of 64   . Depression   . Diabetes mellitus   . Gout   . H/O: hysterectomy   . Hyperlipidemia   . Hypertension   . Malignant hypertension 09/26/2012  . Morbid obesity ([place])   . Nonischemic cardiomyopathy ([place])   . Sleep apnea    on cpap  . Unspecified essential hypertension 03/22/2009   Qualifier: Diagnosis of  By: Karrie Meres, RN, BSN, [name]     No past surgical history on file.  Family Psychiatric History: See intake H&P for full details. Reviewed, with no updates at this time.   Family History:  Family History  Problem Relation Age of Onset  . Asthma Mother   . Heart disease Mother   . Stroke Mother        clotting disorders  . Asthma Brother   . Asthma Brother     Social History:  Social History   Social History  . Marital status: Married    Spouse name: N/A  . Number of children: 2  . Years of education: N/A   Occupational History  . retired cna Unemployed   Social History Main Topics  . Smoking status: Never Smoker  . Smokeless tobacco: Never Used  . Alcohol use No  . Drug use: No  .  Sexual activity: Not Currently    Partners: Male   Other Topics Concern  . None   Social History Narrative  . None    Allergies: No Known Allergies  Metabolic Disorder Labs: Lab Results  Component Value Date   HGBA1C 5.5 11/08/2015   MPG 123 (H) 09/26/2012   MPG 148 (H) 05/26/2010   No results found for: PROLACTIN Lab Results  Component Value Date   CHOL 196 06/11/2016   TRIG 81 06/11/2016   HDL 58 06/11/2016   CHOLHDL 3.4 06/11/2016   VLDL 16 06/11/2016   LDLCALC 122 (H) 06/11/2016   LDLCALC 221 (H) 01/11/2016     Current Medications: Current Outpatient Prescriptions   Medication Sig Dispense Refill  . allopurinol (ZYLOPRIM) 300 MG tablet Take 1 tablet (300 mg total) by mouth daily. 90 tablet 1  . ALOE PO Take 1 capsule by mouth 2 (two) times daily.    Marland Kitchen aspirin EC 81 MG EC tablet Take 1 tablet (81 mg total) by mouth daily. 30 tablet 2  . carvedilol (COREG) 25 MG tablet Take 1 tablet (25 mg total) by mouth 2 (two) times daily with a meal. 180 tablet 1  . ferrous sulfate 325 (65 FE) MG EC tablet Take 325 mg by mouth daily with breakfast.    . FLUoxetine (PROZAC) 40 MG capsule Take 2 capsules (80 mg total) by mouth daily. 60 capsule 5  . furosemide (LASIX) 80 MG tablet Take 1 tablet (80 mg total) by mouth 2 (two) times daily. 60 tablet 5  . HYDROcodone-acetaminophen (NORCO/VICODIN) 5-325 MG tablet Take 1 tablet by mouth every 6 (six) hours as needed for severe pain. 90 tablet 0  . hydrOXYzine (VISTARIL) 25 MG capsule Take 1 capsule (25 mg total) by mouth at bedtime. 90 capsule 0  . liraglutide 18 MG/3ML SOPN Start taking 0.6 mg daily for 1 week. Then increase to 1.2 mg daily for 1 week. Increase to 1.8 mg daily. 3 pen 2  . potassium chloride SA (K-DUR,KLOR-CON) 20 MEQ tablet     . rosuvastatin (CRESTOR) 20 MG tablet Take 1 tablet (20 mg total) by mouth daily. 30 tablet 11  . sacubitril-valsartan (ENTRESTO) 24-26 MG Take 1 tablet by mouth 2 (two) times daily. 60 tablet 3  . spironolactone (ALDACTONE) 25 MG tablet Take 0.5 tablets (12.5 mg total) by mouth daily. 90 tablet 3  . buPROPion (WELLBUTRIN SR) 100 MG 12 hr tablet Take 1 tablet (100 mg total) by mouth 2 (two) times daily. Take in the morning and after lunch 180 tablet 3   No current facility-administered medications for this visit.     Neurologic: Headache: Negative Seizure: Negative Paresthesias: Negative  Musculoskeletal: Strength & Muscle Tone: within normal limits Gait & Station: normal Patient leans: N/A  Psychiatric Specialty Exam: ROS  Blood pressure 110/66, pulse 74, height _0  (1.6  m), weight (!) 306 lb (138.8 kg).Body mass index is 54.21 kg/m.  General Appearance: Casual and Fairly Groomed  Eye Contact:  Good  Speech:  Clear and Coherent  Volume:  Normal  Mood:  Depressed  Affect:  Congruent and Depressed  Thought Process:  Coherent and Goal Directed  Orientation:  Full (Time, Place, and Person)  Thought Content: Logical   Suicidal Thoughts:  No  Homicidal Thoughts:  No  Memory:  Immediate;   Good  Judgement:  Good  Insight:  Good  Psychomotor Activity:  Normal  Concentration:  Concentration: Fair  Recall:  [place] of Knowledge: Fair  Language: Fair  Akathisia:  Negative  Handed:  Right  AIMS (if indicated):  0  Assets:  Communication Skills Desire for Improvement Financial Resources/Insurance Housing Intimacy Social Support Transportation  ADL's:  Intact  Cognition: WNL  Sleep:  8 hours, shifted sleep cycle    Treatment Plan Summary: Navea Woodrow is a 66 year old female with a history of severe major depressive disorder with treatment resistance. We attempted to have her approved for [facility] treatment, but her insurance company declined this request.  We agreed to retry her on Wellbutrin as an augmenting agent for her depression. She currently continues to struggle with psychomotor depressive symptoms, poor motivation, poor energy, and difficulty thinking. We agreed to proceed as below and follow up in 6-8 weeks.  I understand her PCP was also interested in assisting with weight loss, and had considered Wellbutrin as well.  1. Depression, unspecified depression type    Continue Prozac 80 mg daily Wellbutrin 100 mg SR daily, increase to twice a day as tolerated RTC clinic in 6-8 weeks If she does not have some improvement with her symptoms with a retrial of Wellbutrin, we will reattempt to get her approved for [place], MD 01/23/2017, 11:13 AM

## 2017-01-23 NOTE — Patient Instructions (Addendum)
Bupropion tablets (Depression/Mood Disorders) What is this medicine? BUPROPION (byoo PROE pee on) is used to treat depression. This medicine may be used for other purposes; ask your health care provider or pharmacist if you have questions. COMMON BRAND NAME(S): Wellbutrin What should I tell my health care provider before I take this medicine? They need to know if you have any of these conditions: -an eating disorder, such as anorexia or bulimia -bipolar disorder or psychosis -diabetes or high blood sugar, treated with medication -glaucoma -heart disease, previous heart attack, or irregular heart beat -head injury or brain tumor -high blood pressure -kidney or liver disease -seizures -suicidal thoughts or a previous suicide attempt -Tourette's syndrome -weight loss -an unusual or allergic reaction to bupropion, other medicines, foods, dyes, or preservatives -breast-feeding -pregnant or trying to become pregnant How should I use this medicine? Take this medicine by mouth with a glass of water. Follow the directions on the prescription label. You can take it with or without food. If it upsets your stomach, take it with food. Take your medicine at regular intervals. Do not take your medicine more often than directed. Do not stop taking this medicine suddenly except upon the advice of your doctor. Stopping this medicine too quickly may cause serious side effects or your condition may worsen. A special MedGuide will be given to you by the pharmacist with each prescription and refill. Be sure to read this information carefully each time. Talk to your pediatrician regarding the use of this medicine in children. Special care may be needed. Overdosage: If you think you have taken too much of this medicine contact a poison control center or emergency room at once. NOTE: This medicine is only for you. Do not share this medicine with others. What if I miss a dose? If you miss a dose,  take it as soon as you can. If it is less than four hours to your next dose, take only that dose and skip the missed dose. Do not take double or extra doses. What may interact with this medicine? Do not take this medicine with any of the following medications: -linezolid -MAOIs like Azilect, Carbex, Eldepryl, Marplan, Nardil, and Parnate -methylene blue (injected into a vein) -other medicines that contain bupropion like Zyban This medicine may also interact with the following medications: -alcohol -certain medicines for anxiety or sleep -certain medicines for blood pressure like metoprolol, propranolol -certain medicines for depression or psychotic disturbances -certain medicines for HIV or AIDS like efavirenz, lopinavir, nelfinavir, ritonavir -certain medicines for irregular heart beat like propafenone, flecainide -certain medicines for Parkinson's disease like amantadine, levodopa -certain medicines for seizures like carbamazepine, phenytoin, phenobarbital -cimetidine -clopidogrel -cyclophosphamide -digoxin -furazolidone -isoniazid -nicotine -orphenadrine -procarbazine -steroid medicines like prednisone or cortisone -stimulant medicines for attention disorders, weight loss, or to stay awake -tamoxifen -theophylline -thiotepa -ticlopidine -tramadol -warfarin This list may not describe all possible interactions. Give your health care provider a list of all the medicines, herbs, non-prescription drugs, or dietary supplements you use. Also tell them if you smoke, drink alcohol, or use illegal drugs. Some items may interact with your medicine. What should I watch for while using this medicine? Tell your doctor if your symptoms do not get better or if they get worse. Visit your doctor or health care professional for regular checks on your progress. Because it may take several weeks to see the full effects of this medicine, it is important to continue your treatment as prescribed by  your doctor. Patients and their families should watch out for new or worsening thoughts of suicide or depression. Also watch out for sudden changes in feelings such as feeling anxious, agitated, panicky, irritable, hostile, aggressive, impulsive, severely restless, overly excited and hyperactive, or not being able to sleep. If this happens, especially at the beginning of treatment or after a change in dose, call your health care professional. Avoid alcoholic drinks while taking this medicine. Drinking excessive alcoholic beverages, using sleeping or anxiety medicines, or quickly stopping the use of these agents while taking this medicine may increase your risk for a seizure. Do not drive or use heavy machinery until you know how this medicine affects you. This medicine can impair your ability to perform these tasks. Do not take this medicine close to bedtime. It may prevent you from sleeping. Your mouth may get dry. Chewing sugarless gum or sucking hard candy, and drinking plenty of water may help. Contact your doctor if the problem does not go away or is severe. What side effects may I notice from receiving this medicine? Side effects that you should report to your doctor or health care professional as soon as possible: -allergic reactions like skin rash, itching or hives, swelling of the face, lips, or tongue -breathing problems -changes in vision -confusion -elevated mood, decreased need for sleep, racing thoughts, impulsive behavior -fast or irregular heartbeat -hallucinations, loss of contact with reality -increased blood pressure -redness, blistering, peeling or loosening of the skin, including inside the mouth -seizures -suicidal thoughts or other mood changes -unusually weak or tired -vomiting Side effects that usually do not require medical attention (report to your doctor or health care professional if they continue or are bothersome): -constipation -headache -loss of  appetite -nausea -tremors -weight loss This list may not describe all possible side effects. Call your doctor for medical advice about side effects. You may report side effects to FDA at 1-800-FDA-1088. Where should I keep my medicine? Keep out of the reach of children. Store at room temperature between 20 and 25 degrees C (68 and 77 degrees F), away from direct sunlight and moisture. Keep tightly closed. Throw away any unused medicine after the expiration date. NOTE: This sheet is a summary. It may not cover all possible information. If you have questions about this medicine, talk to your doctor, pharmacist, or health care provider.  2018 Elsevier/Gold Standard (2016-01-27 13:44:21)

## 2017-01-29 ENCOUNTER — Encounter: Payer: Self-pay | Admitting: *Deleted

## 2017-03-15 ENCOUNTER — Other Ambulatory Visit (HOSPITAL_COMMUNITY): Payer: Self-pay | Admitting: Cardiology

## 2017-03-20 ENCOUNTER — Other Ambulatory Visit (HOSPITAL_COMMUNITY): Payer: Self-pay | Admitting: Psychiatry

## 2017-03-20 ENCOUNTER — Ambulatory Visit (INDEPENDENT_AMBULATORY_CARE_PROVIDER_SITE_OTHER): Payer: Medicare HMO | Admitting: Psychiatry

## 2017-03-20 ENCOUNTER — Other Ambulatory Visit (HOSPITAL_COMMUNITY): Payer: Self-pay

## 2017-03-20 ENCOUNTER — Encounter (HOSPITAL_COMMUNITY): Payer: Self-pay | Admitting: Psychiatry

## 2017-03-20 VITALS — BP 128/80 | HR 72 | Ht 63.0 in | Wt 306.2 lb

## 2017-03-20 DIAGNOSIS — Z560 Unemployment, unspecified: Secondary | ICD-10-CM

## 2017-03-20 DIAGNOSIS — F329 Major depressive disorder, single episode, unspecified: Secondary | ICD-10-CM

## 2017-03-20 DIAGNOSIS — F332 Major depressive disorder, recurrent severe without psychotic features: Secondary | ICD-10-CM

## 2017-03-20 DIAGNOSIS — Z79899 Other long term (current) drug therapy: Secondary | ICD-10-CM | POA: Diagnosis not present

## 2017-03-20 DIAGNOSIS — F32A Depression, unspecified: Secondary | ICD-10-CM

## 2017-03-20 NOTE — Progress Notes (Signed)
[facility] MD/PA/NP OP Progress Note  03/20/2017 2:04 PM Robin Hardin  MRN:  [id number]  Chief Complaint: med management, tired, fatigue  Subjective:  Robin Hardin prevents for follow-up. She continues to struggle with psychomotor slowing, feels tired, has difficulty with motivation to get moving through that the day. She reports that she knows she should do things such as cleaning up the house, help her husband, but she feels like there is a physical block holding her back from doing it. She reports that she is able to get up and about throughout the house when she needs to, but for the most part just feels very tired and wants to sit around. She takes many naps throughout the day. She reports that she doesn't feel particularly emotionally depressed, but feels very discouraged by the fact that she is so tired. She denies any suicidality or safety issues. She continues to agree with some of the family stressors she has been experiencing, related to her daughter who is currently in hospital with significant brain damage, and her other daughter who has not called her since they moved out.    Spent time discussing my concern about thyroid dysfunction and/or other medical contributors such as anemia. She was agreeable to get thyroid studies today, and laboratory studies as recommended. We discussed that if her thyroid studies are normal, we may consider a switch to Pristiq, but if they are abnormal, it would be most prudent to start with thyroid supplementation and involve her primary care for this.  Visit Diagnosis:    ICD-10-CM   1. Depression, unspecified depression type F32.9   2. Severe episode of recurrent major depressive disorder, without psychotic features ([place]) F33.2     Past Psychiatric History: See intake H&P for full details. Reviewed, with no updates at this time.   Past Medical History:  Past Medical History:  Diagnosis Date  . Acute respiratory failure with hypoxia ([place]) 09/26/2012  .  Bilateral knee pain   . Cellulitis of lower leg    left  . Chronic combined systolic and diastolic congestive heart failure ([place]) 10/10/2012   2 D echo 09/2012:  Left ventricle: Extremely poor acoustic windows limit study. Overall LVEF appears to be severely depressed. Recomm ordering limited study with contrast to further evaluate LV systolic function. The cavity size was normal. Wall thickness was normal. Doppler parameters are consistent with abnormal left ventricular relaxation (grade 1 diastolic dysfunction).   2 D echo 03/2009 :  Left ventricle: The cavity size was normal. There was mild concentric hypertrophy. Systolic function was mildly reduced. The estimated ejection fraction was in the range of 45% to 50%. Diffuse hypokinesis. Doppler parameters are consistent with abnormal left ventricular relaxation (grade 1 diastolic dysfunction).     . Chronic kidney disease 10/10/2012   Stage 2 with GFR of 64   . Depression   . Diabetes mellitus   . Gout   . H/O: hysterectomy   . Hyperlipidemia   . Hypertension   . Malignant hypertension 09/26/2012  . Morbid obesity ([place])   . Nonischemic cardiomyopathy ([place])   . Sleep apnea    on cpap  . Unspecified essential hypertension 03/22/2009   Qualifier: Diagnosis of  By: Karrie Meres RN, BSN, [name]     History reviewed. No pertinent surgical history.  Family Psychiatric History: See intake H&P for full details. Reviewed, with no updates at this time.   Family History:  Family History  Problem Relation Age of Onset  . Asthma Mother   .  Heart disease Mother   . Stroke Mother        clotting disorders  . Asthma Brother   . Asthma Brother     Social History:  Social History   Social History  . Marital status: Married    Spouse name: N/A  . Number of children: 2  . Years of education: N/A   Occupational History  . retired cna Unemployed   Social History Main Topics  . Smoking status: Never Smoker  . Smokeless tobacco: Never Used  . Alcohol  use No  . Drug use: No  . Sexual activity: Not Currently    Partners: Male   Other Topics Concern  . None   Social History Narrative  . None    Allergies: No Known Allergies  Metabolic Disorder Labs: Lab Results  Component Value Date   HGBA1C 5.5 11/08/2015   MPG 123 (H) 09/26/2012   MPG 148 (H) 05/26/2010   No results found for: PROLACTIN Lab Results  Component Value Date   CHOL 196 06/11/2016   TRIG 81 06/11/2016   HDL 58 06/11/2016   CHOLHDL 3.4 06/11/2016   VLDL 16 06/11/2016   LDLCALC 122 (H) 06/11/2016   LDLCALC 221 (H) 01/11/2016     Current Medications: Current Outpatient Prescriptions  Medication Sig Dispense Refill  . allopurinol (ZYLOPRIM) 300 MG tablet Take 1 tablet (300 mg total) by mouth daily. 90 tablet 1  . ALOE PO Take 1 capsule by mouth 2 (two) times daily.    Marland Kitchen aspirin EC 81 MG EC tablet Take 1 tablet (81 mg total) by mouth daily. 30 tablet 2  . buPROPion (WELLBUTRIN SR) 100 MG 12 hr tablet Take 1 tablet (100 mg total) by mouth 2 (two) times daily. Take in the morning and after lunch 180 tablet 3  . carvedilol (COREG) 25 MG tablet Take 1 tablet (25 mg total) by mouth 2 (two) times daily with a meal. 180 tablet 1  . ENTRESTO 24-26 MG TAKE 1 TABLET BY MOUTH 2 (TWO) TIMES DAILY. 60 tablet 3  . ferrous sulfate 325 (65 FE) MG EC tablet Take 325 mg by mouth daily with breakfast.    . FLUoxetine (PROZAC) 40 MG capsule Take 2 capsules (80 mg total) by mouth daily. 60 capsule 5  . furosemide (LASIX) 80 MG tablet Take 1 tablet (80 mg total) by mouth 2 (two) times daily. 60 tablet 5  . HYDROcodone-acetaminophen (NORCO/VICODIN) 5-325 MG tablet Take 1 tablet by mouth every 6 (six) hours as needed for severe pain. 90 tablet 0  . hydrOXYzine (VISTARIL) 25 MG capsule Take 1 capsule (25 mg total) by mouth at bedtime. 90 capsule 0  . liraglutide 18 MG/3ML SOPN Start taking 0.6 mg daily for 1 week. Then increase to 1.2 mg daily for 1 week. Increase to 1.8 mg daily. 3  pen 2  . potassium chloride SA (K-DUR,KLOR-CON) 20 MEQ tablet     . rosuvastatin (CRESTOR) 20 MG tablet Take 1 tablet (20 mg total) by mouth daily. 30 tablet 11  . spironolactone (ALDACTONE) 25 MG tablet Take 0.5 tablets (12.5 mg total) by mouth daily. 90 tablet 3   No current facility-administered medications for this visit.     Neurologic: Headache: Negative Seizure: Negative Paresthesias: Negative  Musculoskeletal: Strength & Muscle Tone: within normal limits Gait & Station: normal Patient leans: N/A  Psychiatric Specialty Exam: ROS  Blood pressure 128/80, pulse 72, height _0  (1.6 m), weight (!) 306 lb 3.2 oz (138.9 kg).Body  mass index is 54.24 kg/m.  General Appearance: Casual and Fairly Groomed  Eye Contact:  Good  Speech:  Clear and Coherent  Volume:  Normal  Mood:  Depressed, tired, eyes drooping  Affect:  Depressed and Flat  Thought Process:  Coherent and Goal Directed  Orientation:  Full (Time, Place, and Person)  Thought Content: Logical   Suicidal Thoughts:  No  Homicidal Thoughts:  No  Memory:  Immediate;   Good  Judgement:  Good  Insight:  Good  Psychomotor Activity:  Normal  Concentration:  Concentration: Fair  Recall:  [place] of Knowledge: Fair  Language: Fair  Akathisia:  Negative  Handed:  Right  AIMS (if indicated):  0  Assets:  Communication Skills Desire for Improvement Financial Resources/Insurance Housing Intimacy Social Support Transportation  ADL's:  Intact  Cognition: WNL  Sleep:  8 hours, shifted sleep cycle, naps during the day    Treatment Plan Summary: Robin Hardin is a 66 year old female with a history of severe major depressive disorder with treatment resistance. She continues to struggle with psychomotor depression, cognitive slowing, and describes some physical symptoms of constipation, dry skin, weight increase. We will check laboratory studies as below, and if normal, we will make the switch from SSRI to [place].   We'll follow-up in 6 weeks.  1. Depression, unspecified depression type   2. Severe episode of recurrent major depressive disorder, without psychotic features ([facility])      Continue Prozac 80 mg daily Wellbutrin 100 mg SR BID Check laboratory studies;, TSH, free T4, vitamin D, CBC Consider Pristiq if laboratory studies are normal RTC clinic in 6-8 weeks  Aundra Dubin, MD 03/20/2017, 2:04 PM

## 2017-03-20 NOTE — Patient Instructions (Signed)
Desvenlafaxine extended-release tablets What is this medicine? DESVENLAFAXINE (des VEN la [employer]) is used to treat depression. This medicine may be used for other purposes; ask your health care provider or pharmacist if you have questions. COMMON BRAND NAME(S): Marshall Cork, Pristiq What should I tell my health care provider before I take this medicine? They need to know if you have any of these conditions: -glaucoma -high blood pressure -kidney disease -liver disease -mania or bipolar disorder -suicidal thoughts or a previous suicide attempt -an unusual reaction to desvenlafaxine, venlafaxine, other medicines, foods, dyes, or preservatives -pregnant or trying to get pregnant -breast-feeding How should I use this medicine? Take this medicine by mouth with a drink of water. Do not crush, cut or chew. Follow the directions on the prescription label. Take your doses at regular intervals. Do not take your medicine more often than directed. Do not stop taking this medicine suddenly except upon the advice of your doctor. Stopping this medicine too quickly may cause serious side effects or your condition may worsen. Contact your pediatrician or health care professional regarding the use of this medicine in children. Special care may be needed. A special MedGuide will be given to you by the pharmacist with each prescription and refill. Be sure to read this information carefully each time. Overdosage: If you think you have taken too much of this medicine contact a poison control center or emergency room at once. NOTE: This medicine is only for you. Do not share this medicine with others. What if I miss a dose? If you miss a dose, take it as soon as you can. If it is almost time for your next dose, take only that dose. Do not take double or extra doses. What may interact with this medicine? Do not take this medicine with any of the following medications: -duloxetine -levomilnacipran -linezolid -MAOIs  like Carbex, Eldepryl, Marplan, Nardil, and Parnate -methylene blue (injected into a vein) -milnacipran -venlafaxine This medicine may also interact with the following medications: -alcohol -amphetamines -aspirin and aspirin-like medicines -certain migraine headache medicines (almotriptan, eletriptan, frovatriptan, naratriptan, rizatriptan, sumatriptan, zolmitriptan) -dexfenfluramine or fenfluramine -furazolidone -isoniazid -lithium -medicines for heart rhythm or blood pressure -medicines that treat or prevent blood clots like warfarin, enoxaparin, and dalteparin -methylphenidate -metoclopramide -NSAIDS, medicines for pain and inflammation, like ibuprofen or naproxen -pentazocine -phentermine -procarbazine -protriptyline -rasagiline -sibutramine -St. John's wort, Hypericum perforatum -tramadol -tryptophan -zolpidem This list may not describe all possible interactions. Give your health care provider a list of all the medicines, herbs, non-prescription drugs, or dietary supplements you use. Also tell them if you smoke, drink alcohol, or use illegal drugs. Some items may interact with your medicine. What should I watch for while using this medicine? Tell your doctor if your symptoms do not get better or if they get worse. Visit your doctor or health care professional for regular checks on your progress. Because it may take several weeks to see the full effects of this medicine, it is important to continue your treatment as prescribed by your doctor. Patients and their families should watch out for new or worsening thoughts of suicide or depression. Also watch out for sudden changes in feelings such as feeling anxious, agitated, panicky, irritable, hostile, aggressive, impulsive, severely restless, overly excited and hyperactive, or not being able to sleep. If this happens, especially at the beginning of treatment or after a change in dose, call your health care professional. This  medicine can cause an increase in blood pressure. Check with your doctor for  instructions on monitoring your blood pressure while taking this medicine. You may get drowsy or dizzy. Do not drive, use machinery, or do anything that needs mental alertness until you know how this medicine affects you. Do not stand or sit up quickly, especially if you are an older patient. This reduces the risk of dizzy or fainting spells. Alcohol may interfere with the effect of this medicine. Avoid alcoholic drinks. Your mouth may get dry. Chewing sugarless gum, sucking hard candy and drinking plenty of water will help. Contact your doctor if the problem does not go away or is severe. What side effects may I notice from receiving this medicine? Side effects that you should report to your doctor or health care professional as soon as possible: -allergic reactions like skin rash, itching or hives, swelling of the face, lips, or tongue -anxious -breathing problems -confusion -changes in vision -chest pain -confusion -elevated mood, decreased need for sleep, racing thoughts, impulsive behavior -eye pain -fast, irregular heartbeat -feeling faint or lightheaded, falls -feeling agitated, angry, or irritable -hallucination, loss of contact with reality -high blood pressure -loss of balance or coordination -palpitations -redness, blistering, peeling or loosening of the skin, including inside the mouth -restlessness, pacing, inability to keep still -seizures -stiff muscles -suicidal thoughts or other mood changes -trouble passing urine or change in the amount of urine -trouble sleeping -unusual bleeding or bruising -unusually weak or tired -vomiting Side effects that usually do not require medical attention (report to your doctor or health care professional if they continue or are bothersome): -change in sex drive or performance -change in appetite or weight -constipation -dizziness -dry  mouth -headache -increased sweating -nausea -tired This list may not describe all possible side effects. Call your doctor for medical advice about side effects. You may report side effects to FDA at 1-800-FDA-1088. Where should I keep my medicine? Keep out of the reach of children. Store at room temperature between 15 and 30 degrees C (59 and 86 degrees F). Throw away any unused medicine after the expiration date. NOTE: This sheet is a summary. It may not cover all possible information. If you have questions about this medicine, talk to your doctor, pharmacist, or health care provider.  2018 Elsevier/Gold Standard (2016-01-05 17:35:48)

## 2017-03-22 LAB — CBC WITH DIFFERENTIAL/PLATELET
BASOS: 0 %
Basophils Absolute: 0 10*3/uL (ref 0.0–0.2)
EOS (ABSOLUTE): 0.1 10*3/uL (ref 0.0–0.4)
Eos: 3 %
HEMATOCRIT: 37.9 % (ref 34.0–46.6)
Hemoglobin: 12.5 g/dL (ref 11.1–15.9)
IMMATURE GRANS (ABS): 0 10*3/uL (ref 0.0–0.1)
Immature Granulocytes: 0 %
LYMPHS ABS: 1.3 10*3/uL (ref 0.7–3.1)
Lymphs: 34 %
MCH: 29.6 pg (ref 26.6–33.0)
MCHC: 33 g/dL (ref 31.5–35.7)
MCV: 90 fL (ref 79–97)
Monocytes Absolute: 0.3 10*3/uL (ref 0.1–0.9)
Monocytes: 7 %
NEUTROS ABS: 2.2 10*3/uL (ref 1.4–7.0)
Neutrophils: 56 %
Platelets: 212 10*3/uL (ref 150–379)
RBC: 4.22 x10E6/uL (ref 3.77–5.28)
RDW: 13.9 % (ref 12.3–15.4)
WBC: 3.9 10*3/uL (ref 3.4–10.8)

## 2017-03-22 LAB — T4, FREE: Free T4: 1.25 ng/dL (ref 0.82–1.77)

## 2017-03-22 LAB — TSH: TSH: 1.03 u[IU]/mL (ref 0.450–4.500)

## 2017-03-22 LAB — VITAMIN D 25 HYDROXY (VIT D DEFICIENCY, FRACTURES): Vit D, 25-Hydroxy: 8.6 ng/mL — ABNORMAL LOW (ref 30.0–100.0)

## 2017-03-25 ENCOUNTER — Other Ambulatory Visit: Payer: Self-pay

## 2017-03-25 ENCOUNTER — Other Ambulatory Visit (HOSPITAL_COMMUNITY): Payer: Self-pay | Admitting: Psychiatry

## 2017-03-25 DIAGNOSIS — E559 Vitamin D deficiency, unspecified: Secondary | ICD-10-CM

## 2017-03-25 DIAGNOSIS — F1129 Opioid dependence with unspecified opioid-induced disorder: Secondary | ICD-10-CM

## 2017-03-25 MED ORDER — VITAMIN D (ERGOCALCIFEROL) 1.25 MG (50000 UNIT) PO CAPS: 50000.0000 [IU] | ORAL_CAPSULE | ORAL | 0 refills | Status: DC

## 2017-03-25 NOTE — Telephone Encounter (Signed)
Requesting pain meds to be filled.

## 2017-04-04 ENCOUNTER — Ambulatory Visit (INDEPENDENT_AMBULATORY_CARE_PROVIDER_SITE_OTHER): Payer: Medicare HMO | Admitting: Internal Medicine

## 2017-04-04 ENCOUNTER — Ambulatory Visit (INDEPENDENT_AMBULATORY_CARE_PROVIDER_SITE_OTHER): Payer: Medicare HMO | Admitting: Psychiatry

## 2017-04-04 ENCOUNTER — Encounter (HOSPITAL_COMMUNITY): Payer: Self-pay | Admitting: Psychiatry

## 2017-04-04 ENCOUNTER — Telehealth: Payer: Self-pay | Admitting: *Deleted

## 2017-04-04 ENCOUNTER — Other Ambulatory Visit: Payer: Self-pay | Admitting: *Deleted

## 2017-04-04 VITALS — BP 106/62 | HR 70 | Ht 63.0 in | Wt 303.0 lb

## 2017-04-04 VITALS — BP 108/90 | HR 66 | Temp 98.7°F | Wt 305.9 lb

## 2017-04-04 DIAGNOSIS — G8929 Other chronic pain: Secondary | ICD-10-CM | POA: Diagnosis not present

## 2017-04-04 DIAGNOSIS — M25562 Pain in left knee: Secondary | ICD-10-CM | POA: Diagnosis not present

## 2017-04-04 DIAGNOSIS — Z79891 Long term (current) use of opiate analgesic: Secondary | ICD-10-CM

## 2017-04-04 DIAGNOSIS — F329 Major depressive disorder, single episode, unspecified: Secondary | ICD-10-CM

## 2017-04-04 DIAGNOSIS — F331 Major depressive disorder, recurrent, moderate: Secondary | ICD-10-CM | POA: Diagnosis not present

## 2017-04-04 DIAGNOSIS — F32A Depression, unspecified: Secondary | ICD-10-CM

## 2017-04-04 DIAGNOSIS — M25561 Pain in right knee: Secondary | ICD-10-CM

## 2017-04-04 DIAGNOSIS — M545 Low back pain: Secondary | ICD-10-CM | POA: Diagnosis not present

## 2017-04-04 DIAGNOSIS — Z560 Unemployment, unspecified: Secondary | ICD-10-CM | POA: Diagnosis not present

## 2017-04-04 MED ORDER — HYDROXYZINE PAMOATE 25 MG PO CAPS: 25.0000 mg | ORAL_CAPSULE | Freq: Every day | ORAL | 0 refills | Status: DC

## 2017-04-04 MED ORDER — ACETAMINOPHEN 500 MG PO TABS: 1000.0000 mg | ORAL_TABLET | Freq: Three times a day (TID) | ORAL | 1 refills | Status: AC | PRN

## 2017-04-04 MED ORDER — DULOXETINE HCL 60 MG PO CPEP: 60.0000 mg | ORAL_CAPSULE | Freq: Every day | ORAL | 1 refills | Status: DC

## 2017-04-04 NOTE — Patient Instructions (Signed)
Ms. Paterson it was nice meeting you today.  -Take Tylenol 1000 mg 3 times a day as needed for back and/or knee pain.  -I have referred her to physical therapy.  -Return for a follow-up in one month.

## 2017-04-04 NOTE — Telephone Encounter (Signed)
Pt here today in [facility] - states Victoza too expensive- costs $300 for 1 month supply. States she takes it for weight loss - currently out of med. Thanks

## 2017-04-04 NOTE — Progress Notes (Signed)
BH MD/PA/NP OP Progress Note  04/04/2017 4:10 PM Robin Hardin  MRN:  [id number]  Chief Complaint: med management, tired, fatigue Chief Complaint    Follow-up     Subjective:  Robin Hardin continues with depressive symptoms and low energy and chronic pain. She reports that she has no motivation to do anything, she feels often sad and fatigued, and has poor concentration. She confirms that she has started vitamin D.  PCP has suggested to her that we start Cymbalta given that she also has chronic pain, and I expressed that this would be okay. Discussed that she needs to stop Prozac for approximately 1 week before she starts Cymbalta. Discussed that given her high dose of Prozac 80 mg, we should start Cymbalta at 60 mg daily in 1 week. Instructed the patient that she should discontinue Prozac today, Prozac has a long half life and will self taper over the next week. Discussed that she should continue Vistaril at night for sleep and Wellbutrin 100 mg twice daily.  Visit Diagnosis:    ICD-10-CM   1. MDD (major depressive disorder), recurrent episode, moderate ([place]) F33.1     Past Psychiatric History: See intake H&P for full details. Reviewed, with no updates at this time.   Past Medical History:  Past Medical History:  Diagnosis Date  . Acute respiratory failure with hypoxia ([place]) 09/26/2012  . Bilateral knee pain   . Cellulitis of lower leg    left  . Chronic combined systolic and diastolic congestive heart failure ([place]) 10/10/2012   2 D echo 09/2012:  Left ventricle: Extremely poor acoustic windows limit study. Overall LVEF appears to be severely depressed. Recomm ordering limited study with contrast to further evaluate LV systolic function. The cavity size was normal. Wall thickness was normal. Doppler parameters are consistent with abnormal left ventricular relaxation (grade 1 diastolic dysfunction).   2 D echo 03/2009 :  Left ventricle: The cavity size was normal. There was mild  concentric hypertrophy. Systolic function was mildly reduced. The estimated ejection fraction was in the range of 45% to 50%. Diffuse hypokinesis. Doppler parameters are consistent with abnormal left ventricular relaxation (grade 1 diastolic dysfunction).     . Chronic kidney disease 10/10/2012   Stage 2 with GFR of 64   . Depression   . Diabetes mellitus   . Gout   . H/O: hysterectomy   . Hyperlipidemia   . Hypertension   . Malignant hypertension 09/26/2012  . Morbid obesity ([place])   . Nonischemic cardiomyopathy ([place])   . Sleep apnea    on cpap  . Unspecified essential hypertension 03/22/2009   Qualifier: Diagnosis of  By: Karrie Meres, RN, BSN, [name]     No past surgical history on file.  Family Psychiatric History: See intake H&P for full details. Reviewed, with no updates at this time.   Family History:  Family History  Problem Relation Age of Onset  . Asthma Mother   . Heart disease Mother   . Stroke Mother        clotting disorders  . Asthma Brother   . Asthma Brother     Social History:  Social History   Social History  . Marital status: Married    Spouse name: N/A  . Number of children: 2  . Years of education: N/A   Occupational History  . retired cna Unemployed   Social History Main Topics  . Smoking status: Never Smoker  . Smokeless tobacco: Never Used  . Alcohol use No  .  Drug use: No  . Sexual activity: Not Currently    Partners: Male   Other Topics Concern  . None   Social History Narrative  . None    Allergies: No Known Allergies  Metabolic Disorder Labs: Lab Results  Component Value Date   HGBA1C 5.5 11/08/2015   MPG 123 (H) 09/26/2012   MPG 148 (H) 05/26/2010   No results found for: PROLACTIN Lab Results  Component Value Date   CHOL 196 06/11/2016   TRIG 81 06/11/2016   HDL 58 06/11/2016   CHOLHDL 3.4 06/11/2016   VLDL 16 06/11/2016   LDLCALC 122 (H) 06/11/2016   LDLCALC 221 (H) 01/11/2016     Current Medications: Current  Outpatient Prescriptions  Medication Sig Dispense Refill  . acetaminophen (TYLENOL) 500 MG tablet Take 2 tablets (1,000 mg total) by mouth every 8 (eight) hours as needed. 90 tablet 1  . allopurinol (ZYLOPRIM) 300 MG tablet Take 1 tablet (300 mg total) by mouth daily. 90 tablet 1  . ALOE PO Take 1 capsule by mouth 2 (two) times daily.    Marland Kitchen aspirin EC 81 MG EC tablet Take 1 tablet (81 mg total) by mouth daily. 30 tablet 2  . buPROPion (WELLBUTRIN SR) 100 MG 12 hr tablet Take 1 tablet (100 mg total) by mouth 2 (two) times daily. Take in the morning and after lunch 180 tablet 3  . carvedilol (COREG) 25 MG tablet Take 1 tablet (25 mg total) by mouth 2 (two) times daily with a meal. 180 tablet 1  . ENTRESTO 24-26 MG TAKE 1 TABLET BY MOUTH 2 (TWO) TIMES DAILY. 60 tablet 3  . ferrous sulfate 325 (65 FE) MG EC tablet Take 325 mg by mouth daily with breakfast.    . furosemide (LASIX) 80 MG tablet Take 1 tablet (80 mg total) by mouth 2 (two) times daily. 60 tablet 5  . hydrOXYzine (VISTARIL) 25 MG capsule Take 1 capsule (25 mg total) by mouth at bedtime. 90 capsule 0  . liraglutide 18 MG/3ML SOPN Start taking 0.6 mg daily for 1 week. Then increase to 1.2 mg daily for 1 week. Increase to 1.8 mg daily. 3 pen 2  . potassium chloride SA (K-DUR,KLOR-CON) 20 MEQ tablet     . rosuvastatin (CRESTOR) 20 MG tablet Take 1 tablet (20 mg total) by mouth daily. 30 tablet 11  . spironolactone (ALDACTONE) 25 MG tablet Take 0.5 tablets (12.5 mg total) by mouth daily. 90 tablet 3  . Vitamin D, Ergocalciferol, (DRISDOL) 50000 units CAPS capsule Take 1 capsule (50,000 Units total) by mouth every 7 (seven) days. Take 1 capsule every 7 days, until the bottle is finished 8 capsule 0  . [START ON 04/11/2017] DULoxetine (CYMBALTA) 60 MG capsule Take 1 capsule (60 mg total) by mouth daily. 90 capsule 1   No current facility-administered medications for this visit.     Neurologic: Headache: Negative Seizure:  Negative Paresthesias: Negative  Musculoskeletal: Strength & Muscle Tone: within normal limits Gait & Station: normal Patient leans: N/A  Psychiatric Specialty Exam: ROS  Blood pressure 106/62, pulse 70, height _0  (1.6 m), weight (!) 303 lb (137.4 kg), SpO2 95 %.Body mass index is 53.67 kg/m.  General Appearance: Casual and Fairly Groomed  Eye Contact:  Good  Speech:  Clear and Coherent  Volume:  Normal  Mood:  Depressed  Affect:  Congruent  Thought Process:  Coherent and Goal Directed  Orientation:  Full (Time, Place, and Person)  Thought Content: Logical  Suicidal Thoughts:  No  Homicidal Thoughts:  No  Memory:  Immediate;   Good  Judgement:  Good  Insight:  Fair  Psychomotor Activity:  Normal  Concentration:  Concentration: Fair  Recall:  [employer] of Knowledge: Fair  Language: Fair  Akathisia:  Negative  Handed:  Right  AIMS (if indicated):  0  Assets:  Communication Skills Desire for Improvement Financial Resources/Insurance Housing Intimacy Social Support Transportation  ADL's:  Intact  Cognition: WNL  Sleep:  8 hours with vistaril/melatonin    Treatment Plan Summary: Amauria Younts is a 66 year old female with a history of severe major depressive disorder with treatment resistance. We're also treating hypovitaminosis D. Given her failure with high-dose SSRI, we have agreed to switch to Cymbalta. Will proceed as below and follow up in 3 months.  1. MDD (major depressive disorder), recurrent episode, moderate ([facility])     Discontinue Prozac x 1 week Start Cymbalta 60 mg daily in 1 week Wellbutrin 100 mg SR BID RTC 3 months  Robin Dubin, MD 04/04/2017, 4:10 PM

## 2017-04-04 NOTE — Patient Instructions (Signed)
STOP Prozac  In 1 week, Start Cymbalta 60 mg daily

## 2017-04-05 NOTE — Assessment & Plan Note (Signed)
Assessment Patient is requesting a refill on hydrocodone-acetaminophen for her chronic bilateral knee and lower back pain. She was last given 3 paper prescriptions for hydrocodone-acetaminophen 5-325 mg 1 tablet every 6 hours as needed on 09/25/2016 for #90 each (3 month supply). These prescriptions were filled on 10/15/2016, 12/25/2016, and 02/05/2017. During her previous visit in February, patient had mentioned that she was out of pain medication for 3 weeks. At this visit again she reports being out of pain medicine for 3 weeks. States when she had the medicine she was taking 2 pills on a good day and 3 pills on a bad day; half of the month was bad days. Patient is requesting a higher dose of her pain medicine. On exam, patient appeared very comfortable and was in no distress.  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 I had an extensive discussion with the patient at this visit about opioid use. Explained to her that the risks of chronic opiates outweigh benefits. Since she has been able to go without any opiates for several weeks now and even during her prior visit, it may be a sign that she does not require opiates and that her pain can be managed effectively with other non-opiate medications. She is not at risk for withdrawal anymore. Patient agreed and was open to the idea of trying something different.  Plan -Referral to physical therapy -Tylenol 1000 mg every 8 hours as needed -Patient had an appointment with her psychiatrist the same day. I encouraged her to discuss Cymbalta with him as she is currently on other antidepressants as well. I was not able to reach her psychiatrist directly. Chart review shows she has now been started on Cymbalta by her psychiatrist.

## 2017-04-05 NOTE — Progress Notes (Signed)
   CC: Patient is requesting pain medication for her chronic knee and back pain.  HPI:  [name] is a 66 y.o. female with a past medical history of conditions listed below presenting to the clinic requesting pain medication for her chronic knee and back pain. Please see problem based charting for the status of the patient's current and chronic medical conditions.   Past Medical History:  Diagnosis Date  . Acute respiratory failure with hypoxia ([place]) 09/26/2012  . Bilateral knee pain   . Cellulitis of lower leg    left  . Chronic combined systolic and diastolic congestive heart failure ([place]) 10/10/2012   2 D echo 09/2012:  Left ventricle: Extremely poor acoustic windows limit study. Overall LVEF appears to be severely depressed. Recomm ordering limited study with contrast to further evaluate LV systolic function. The cavity size was normal. Wall thickness was normal. Doppler parameters are consistent with abnormal left ventricular relaxation (grade 1 diastolic dysfunction).   2 D echo 03/2009 :  Left ventricle: The cavity size was normal. There was mild concentric hypertrophy. Systolic function was mildly reduced. The estimated ejection fraction was in the range of 45% to 50%. Diffuse hypokinesis. Doppler parameters are consistent with abnormal left ventricular relaxation (grade 1 diastolic dysfunction).     . Chronic kidney disease 10/10/2012   Stage 2 with GFR of 64   . Depression   . Diabetes mellitus   . Gout   . H/O: hysterectomy   . Hyperlipidemia   . Hypertension   . Malignant hypertension 09/26/2012  . Morbid obesity ([place])   . Nonischemic cardiomyopathy ([place])   . Sleep apnea    on cpap  . Unspecified essential hypertension 03/22/2009   Qualifier: Diagnosis of  By: Karrie Meres, RN, BSN, [name]     Review of Systems: Pertinent positives mentioned in HPI. Remainder of all ROS negative.   Physical Exam:  Vitals:   04/04/17 1320  BP: 108/90  Pulse: 66  Temp: 98.7 F (37.1 C)    TempSrc: Oral  SpO2: 99%  Weight: (!) 305 lb 14.4 oz (138.8 kg)   Physical Exam  Constitutional: She is oriented to person, place, and time. She appears well-developed and well-nourished. No distress.  HENT:  Head: Normocephalic and atraumatic.  Eyes: Right eye exhibits no discharge. Left eye exhibits no discharge.  Cardiovascular: Normal rate, regular rhythm and intact distal pulses.   Pulmonary/Chest: Effort normal and breath sounds normal. No respiratory distress. She has no wheezes. She has no rales.  Abdominal: Soft. Bowel sounds are normal. She exhibits no distension. There is no tenderness.  Musculoskeletal: She exhibits no edema.  Neurological: She is alert and oriented to person, place, and time.  Skin: Skin is warm and dry.    Assessment & Plan:   See Encounters Tab for problem based charting.  Patient discussed with Dr. Dareen Piano

## 2017-04-05 NOTE — Progress Notes (Signed)
Internal Medicine Clinic Attending  Case discussed with Dr. Rathore at the time of the visit.  We reviewed the resident's history and exam and pertinent patient test results.  I agree with the assessment, diagnosis, and plan of care documented in the resident's note. 

## 2017-04-05 NOTE — Telephone Encounter (Signed)
Tried to call patient, had to leave a message to call back

## 2017-04-16 ENCOUNTER — Encounter: Payer: Self-pay | Admitting: Physical Therapy

## 2017-04-16 ENCOUNTER — Ambulatory Visit: Payer: Medicare HMO | Attending: Internal Medicine | Admitting: Physical Therapy

## 2017-04-16 DIAGNOSIS — R293 Abnormal posture: Secondary | ICD-10-CM

## 2017-04-16 DIAGNOSIS — G8929 Other chronic pain: Secondary | ICD-10-CM | POA: Diagnosis not present

## 2017-04-16 DIAGNOSIS — M25562 Pain in left knee: Secondary | ICD-10-CM | POA: Insufficient documentation

## 2017-04-16 DIAGNOSIS — M6281 Muscle weakness (generalized): Secondary | ICD-10-CM

## 2017-04-16 DIAGNOSIS — M62830 Muscle spasm of back: Secondary | ICD-10-CM | POA: Insufficient documentation

## 2017-04-16 DIAGNOSIS — M25561 Pain in right knee: Secondary | ICD-10-CM | POA: Diagnosis not present

## 2017-04-16 DIAGNOSIS — M545 Low back pain: Secondary | ICD-10-CM | POA: Diagnosis not present

## 2017-04-16 NOTE — Therapy (Signed)
[place], [place], [address] Phone: [phone]   Fax:  [phone]  Physical Therapy Evaluation  Patient Details  Name: Robin Hardin MRN: [id number] Date of Birth: August 03, 1951 Referring Provider: Bartholomew Crews, MD  Encounter Date: 04/16/2017      PT End of Session - 04/16/17 1033    Visit Number 1   Number of Visits 17   Date for PT Re-Evaluation 06/11/17   Authorization Type MCR: Kx mod by 15th visit, progress not by10th visit   PT Start Time 0932   PT Stop Time 1016   PT Time Calculation (min) 44 min   Activity Tolerance Patient tolerated treatment well   Behavior During Therapy [facility] for tasks assessed/performed      Past Medical History:  Diagnosis Date  . Acute respiratory failure with hypoxia ([place]) 09/26/2012  . Bilateral knee pain   . Cellulitis of lower leg    left  . Chronic combined systolic and diastolic congestive heart failure ([place]) 10/10/2012   2 D echo 09/2012:  Left ventricle: Extremely poor acoustic windows limit study. Overall LVEF appears to be severely depressed. Recomm ordering limited study with contrast to further evaluate LV systolic function. The cavity size was normal. Wall thickness was normal. Doppler parameters are consistent with abnormal left ventricular relaxation (grade 1 diastolic dysfunction).   2 D echo 03/2009 :  Left ventricle: The cavity size was normal. There was mild concentric hypertrophy. Systolic function was mildly reduced. The estimated ejection fraction was in the range of 45% to 50%. Diffuse hypokinesis. Doppler parameters are consistent with abnormal left ventricular relaxation (grade 1 diastolic dysfunction).     . Chronic kidney disease 10/10/2012   Stage 2 with GFR of 64   . Depression   . Diabetes mellitus   . Gout   . H/O: hysterectomy   . Hyperlipidemia   . Hypertension   . Malignant hypertension 09/26/2012  . Morbid obesity ([place])   .  Nonischemic cardiomyopathy ([place])   . Sleep apnea    on cpap  . Unspecified essential hypertension 03/22/2009   Qualifier: Diagnosis of  By: Karrie Meres RN, BSN, [name]      History reviewed. No pertinent surgical history.  There were no vitals filed for this visit.       Subjective Assessment - 04/16/17 0938    Subjective pt is a 66 y.o F with CC of bil knee and low back pain. Back pain started over 20 years ago which she is unsure of any specific mechanism of injury. Pain is in the low back in the middle and is worse standing the most but can fluctuate depending on activity. she denies any N/T. since onset the low back pain has gradually worsened. Bil knee pain started around the same time as the back did, she is unsure what caused the  knee pain but attributes it to weight gain. Pain occures on the inside of the knee and back of the knee.    Limitations Standing   How long can you sit comfortably? unlimited   How long can you stand comfortably? 2-3 min   How long can you walk comfortably? 5 min   Diagnostic tests N/A   Patient Stated Goals Help with standing, decrease pain, be able to cook and do activities around the house, help with husband    Currently in Pain? Yes   Pain Score 8   at worst 10/10   Pain Location Back  Pain Descriptors / Indicators Tightness;Pressure   Pain Type Chronic pain   Pain Frequency Intermittent   Aggravating Factors  Standing, sitting in a certain kind of chair (leaning back too much)   Pain Relieving Factors sitting down and resting, sitting in right position, medication,    Multiple Pain Sites Yes   Pain Score 0  at worst 10/10   Pain Location Knee   Pain Orientation Right;Left   Pain Descriptors / Indicators Sharp;Aching;Constant   Pain Type Chronic pain   Pain Frequency Intermittent   Aggravating Factors  standing/ walking, stairs up/ down, getting up from a chair   Pain Relieving Factors medication,             [facility] PT Assessment -  04/16/17 0925      Assessment   Medical Diagnosis low back and bil knee pain   Referring Provider Bartholomew Crews, MD   Onset Date/Surgical Date --  for back and knee pain over 20 years   Hand Dominance Left   Next MD Visit --  05/21/2017   Prior Therapy yes     Precautions   Precautions None     Restrictions   Weight Bearing Restrictions No     Balance Screen   Has the patient fallen in the past 6 months Yes   How many times? 1   Has the patient had a decrease in activity level because of a fear of falling?  No   Is the patient reluctant to leave their home because of a fear of falling?  No     Home Environment   Living Environment Private residence   Living Arrangements Spouse/significant other   Available Help at Discharge Family;Available PRN/intermittently   Type of Home House   Home Access Stairs to enter   Entrance Stairs-Number of Steps 1   Entrance Stairs-Rails None   Home Layout Two level   Alternate Level Stairs-Number of Steps 14   Alternate Level Stairs-Rails Left   Home Equipment Walker - 2 wheels;Wheelchair - manual;Shower seat     Prior Function   Level of Independence Needs assistance with ADLs;Needs assistance with homemaking   Vacuuming Moderate   Light Housekeeping Moderate   Vocation Retired   Leisure go to [employer], working on the computer with Water quality scientist   Overall Cognitive Status Within Functional Limits for tasks assessed     Observation/Other Assessments   Focus on Therapeutic Outcomes (FOTO)  60% limited  predicted 49% limited     Posture/Postural Control   Posture/Postural Control Postural limitations   Postural Limitations Rounded Shoulders;Forward head;Decreased lumbar lordosis     ROM / Strength   AROM / PROM / Strength AROM;Strength;PROM     AROM   AROM Assessment Site Knee;Lumbar   Right/Left Knee Right;Left   Right Knee Extension -12   Right Knee Flexion 102   Left Knee Extension -13   Left Knee  Flexion 98   Lumbar Flexion 90   Lumbar Extension 18  pain during movement   Lumbar - Right Side Bend 8   Lumbar - Left Side Bend 8  pain during movement     PROM   PROM Assessment Site Knee     Strength   Strength Assessment Site Knee;Hip   Right/Left Hip Right;Left   Right Hip Flexion 3+/5   Right Hip Extension 3/5   Right Hip ABduction 3+/5   Right Hip ADduction 3+/5   Left Hip Flexion 4-/5  Left Hip Extension 3/5   Left Hip ABduction 3+/5   Left Hip ADduction 3+/5   Right/Left Knee Right;Left   Right Knee Flexion 3+/5  pain durting testing in medial knee   Right Knee Extension 3+/5  pain durting testing in medial knee   Left Knee Flexion 3+/5  pain durting testing in medial knee   Left Knee Extension 3+/5  pain durting testing in medial knee     Palpation   Spinal mobility hypomobil PA PAIVM at L1-L5 with pain during assessment   Palpation comment tenderness in bil lumbar paraspinals, and glute max,  pain at bil pes anserine, and bil semi-memnbranosus/ tendinosus tendons            Objective measurements completed on examination: See above findings.                  PT Education - 04/16/17 1016    Education provided Yes   Education Details evaluation findings, POC, goals, HEP with form/ rationale, anatomy of areas involved   Person(s) Educated Patient   Methods Explanation;Verbal cues;Handout   Comprehension Verbalized understanding;Verbal cues required          PT Short Term Goals - 04/16/17 1112      PT SHORT TERM GOAL #1   Title pt to be I with inital HEP   Time 4   Period Weeks   Status New   Target Date 05/14/17     PT SHORT TERM GOAL #2   Title pt to verbalize/ demo proper posture and lifting techniques to prevent and reduce low back pain   Time 4   Period Weeks   Status New   Target Date 05/14/17     PT SHORT TERM GOAL #3   Title pt to be able to stand for >/= 8 min to demo improving  standing tolerance with </= 6/10  pain in the low back    Time 4   Period Weeks   Status New   Target Date 05/14/17           PT Long Term Goals - 04/16/17 1151      PT LONG TERM GOAL #1   Title pt to increase bil LE strength to >/= 4/5 to promote knee/ hip stability with walking/ standing activities   Time 8   Period Weeks   Status New   Target Date 06/11/17     PT LONG TERM GOAL #2   Title pt to increase trunk side bending by >/= 8 degrees and functional trunk mobility with </= 3/10 pain for functional mobility required for ADLs    Time 8   Period Weeks   Status New   Target Date 06/11/17     PT LONG TERM GOAL #3   Title she will be able to stand and walk for >/= 20 min and navigate >/= 15 steps with </= 4/10 pain for functional home/ community ambulation   Time 8   Period Weeks   Status New   Target Date 06/11/17     PT LONG TERM GOAL #4   Title increase FOTO to </= 49% limited to demo improvement in function    Time 8   Period Weeks   Status New   Target Date 06/11/17     PT LONG TERM GOAL #5   Title pt to be I with all HEP given as of last visit    Time 8   Period Weeks   Status New  Target Date 06/11/17                Plan - 04/16/17 1036    Clinical Impression Statement pt presents to OPPT with CC of chronic low back and bil knee pain. She demonstrates limited knee mobility secondary to soft tissue approximation and pain. TTP along bil hamstring tendons and at the pes anserine. low back she demonstrats limited side bending with pain during movement to the L. TTP in the lumbar parapsinals and upper glute bil. she would benefit from physical therapy to decrease low back pain, improve trunk mobility, increase standing endurnace, and maximize function by addressing the deficits listed.    History and Personal Factors relevant to plan of care: hx or HTN, DM, gout, obesity   Clinical Presentation Evolving   Clinical Presentation due to: chronic pain in low back, bil knee pain, limited  knee pain, limited standing endurance   Clinical Decision Making Moderate   Rehab Potential Fair   PT Frequency 2x / week   PT Duration 8 weeks   PT Treatment/Interventions ADLs/Self Care Home Management;Cryotherapy;Electrical Stimulation;Iontophoresis 78m/ml Dexamethasone;Moist Heat;Ultrasound;Therapeutic activities;Therapeutic exercise;Dry needling;Taping;Manual techniques;Neuromuscular re-education;Patient/family education;Gait training;DME Instruction;Passive range of motion;Stair training;Balance training   PT Next Visit Plan reviewe and update HEP PRN, core strengthening, manual for low back, hip strengthening, hamstring stretching   PT Home Exercise Plan hamstring stretching, lower trunk rotation, low back stretch (in sitting), posterior pelvic tilt, SAQ with ball squeeze   Consulted and Agree with Plan of Care Patient      Patient will benefit from skilled therapeutic intervention in order to improve the following deficits and impairments:  Abnormal gait, Pain, Improper body mechanics, Obesity, Postural dysfunction, Decreased range of motion, Decreased endurance, Decreased strength, Decreased activity tolerance, Decreased balance, Increased fascial restricitons, Increased muscle spasms, Decreased safety awareness, Decreased mobility  Visit Diagnosis: Chronic bilateral low back pain without sciatica - Plan: PT plan of care cert/re-cert  Muscle spasm of back - Plan: PT plan of care cert/re-cert  Chronic pain of left knee - Plan: PT plan of care cert/re-cert  Chronic pain of right knee - Plan: PT plan of care cert/re-cert  Muscle weakness (generalized) - Plan: PT plan of care cert/re-cert  Abnormal posture - Plan: PT plan of care cert/re-cert      G-Codes - 099/24/261156    Functional Assessment Tool Used (Outpatient Only) ROM, weakness, Pain, clinical judgment   Functional Limitation Changing and maintaining body position   Changing and Maintaining Body Position Current Status  ((S3419 At least 60 percent but less than 80 percent impaired, limited or restricted   Changing and Maintaining Body Position Goal Status ((Q2229 At least 40 percent but less than 60 percent impaired, limited or restricted       Problem List Patient Active Problem List   Diagnosis Date Noted  . Ingrown toenail 09/26/2016  . Normocytic anemia 12/28/2014  . History of diabetes mellitus 07/27/2014  . Health care maintenance 08/25/2013  . Depression 03/03/2013  . Long-term current use of opiate analgesic 03/03/2013  . Chronic combined systolic [place] diastolic congestive heart failure ([place] 10/10/2012  . CKD (chronic kidney disease) stage 3, GFR 30-59 ml/min 10/10/2012  . Gout 10/09/2012  . Hyperlipidemia 05/31/2009  . Obstructive sleep apnea 04/07/2009  . Morbid obesity with BMI of 50.0-59.9, [place] 03/22/2009  . Essential hypertension 03/22/2009   KStarr LakePT, DPT, LAT, ATC  04/16/17  11:58 AM      [facility] [address]  [place], [place], [address] Phone: [phone]   Fax:  [phone]  Name: Robin Hardin MRN: [id number] Date of Birth: 1950/08/21

## 2017-04-17 ENCOUNTER — Ambulatory Visit: Payer: Medicare HMO | Admitting: Pharmacist

## 2017-04-17 DIAGNOSIS — Z79899 Other long term (current) drug therapy: Secondary | ICD-10-CM

## 2017-04-24 ENCOUNTER — Ambulatory Visit: Payer: Medicare HMO | Attending: Internal Medicine | Admitting: Physical Therapy

## 2017-04-24 ENCOUNTER — Encounter: Payer: Self-pay | Admitting: Physical Therapy

## 2017-04-24 DIAGNOSIS — R293 Abnormal posture: Secondary | ICD-10-CM | POA: Diagnosis not present

## 2017-04-24 DIAGNOSIS — M62830 Muscle spasm of back: Secondary | ICD-10-CM

## 2017-04-24 DIAGNOSIS — M545 Low back pain, unspecified: Secondary | ICD-10-CM

## 2017-04-24 DIAGNOSIS — G8929 Other chronic pain: Secondary | ICD-10-CM

## 2017-04-24 DIAGNOSIS — M25561 Pain in right knee: Secondary | ICD-10-CM | POA: Insufficient documentation

## 2017-04-24 DIAGNOSIS — M25562 Pain in left knee: Secondary | ICD-10-CM | POA: Diagnosis not present

## 2017-04-24 DIAGNOSIS — M6281 Muscle weakness (generalized): Secondary | ICD-10-CM

## 2017-04-24 NOTE — Therapy (Signed)
[place], [place], [address] Phone: [phone]   Fax:  [phone]  Physical Therapy Treatment  Patient Details  Name: Robin Hardin MRN: [id number] Date of Birth: 03-Jul-1951 Referring Provider: Bartholomew Crews, MD  Encounter Date: 04/24/2017      PT End of Session - 04/24/17 0908    Visit Number 2   Number of Visits 17   Date for PT Re-Evaluation 06/11/17   Authorization Type MCR: Kx mod by 15th visit, progress not by10th visit   PT Start Time 0850   PT Stop Time 0930   PT Time Calculation (min) 40 min   Activity Tolerance Patient tolerated treatment well   Behavior During Therapy [facility] for tasks assessed/performed      Past Medical History:  Diagnosis Date  . Acute respiratory failure with hypoxia ([place]) 09/26/2012  . Bilateral knee pain   . Cellulitis of lower leg    left  . Chronic combined systolic and diastolic congestive heart failure ([place]) 10/10/2012   2 D echo 09/2012:  Left ventricle: Extremely poor acoustic windows limit study. Overall LVEF appears to be severely depressed. Recomm ordering limited study with contrast to further evaluate LV systolic function. The cavity size was normal. Wall thickness was normal. Doppler parameters are consistent with abnormal left ventricular relaxation (grade 1 diastolic dysfunction).   2 D echo 03/2009 :  Left ventricle: The cavity size was normal. There was mild concentric hypertrophy. Systolic function was mildly reduced. The estimated ejection fraction was in the range of 45% to 50%. Diffuse hypokinesis. Doppler parameters are consistent with abnormal left ventricular relaxation (grade 1 diastolic dysfunction).     . Chronic kidney disease 10/10/2012   Stage 2 with GFR of 64   . Depression   . Diabetes mellitus   . Gout   . H/O: hysterectomy   . Hyperlipidemia   . Hypertension   . Malignant hypertension 09/26/2012  . Morbid obesity ([place])   .  Nonischemic cardiomyopathy ([place])   . Sleep apnea    on cpap  . Unspecified essential hypertension 03/22/2009   Qualifier: Diagnosis of  By: Karrie Meres RN, BSN, [name]      History reviewed. No pertinent surgical history.  There were no vitals filed for this visit.      Subjective Assessment - 04/24/17 0855    Subjective "I didn't get a chance to do the exercises because I lost them, but I think it is a little better today"    Currently in Pain? Yes   Pain Score 5    Pain Location Back   Pain Orientation Left;Right;Lower   Pain Onset More than a month ago   Pain Frequency Intermittent   Aggravating Factors  standing, sitting in a certain kind of chair   Pain Relieving Factors sitting down and resting   Pain Score 3   Pain Orientation Right;Left   Pain Descriptors / Indicators Aching   Pain Type Chronic pain   Aggravating Factors  standing / walking, stairs up/ down, getting up from a chair   Pain Relieving Factors medication                          [facility] Adult PT Treatment/Exercise - 04/24/17 0904      Lumbar Exercises: Stretches   Active Hamstring Stretch 2 reps;30 seconds   Lower Trunk Rotation --  2 x 10 with 5 sec hold   Pelvic Tilt 2  reps;30 seconds     Lumbar Exercises: Seated   Long Arc Quad on Chair Strengthening;Both;2 sets;10 reps;Weights   LAQ on Chair Weights (lbs) 2   Other Seated Lumbar Exercises marching seated in chair bil alternating 2 x 12 2#     Knee/Hip Exercises: Stretches   Active Hamstring Stretch 2 reps;30 seconds;Both  in sitting, using hands pushing down on knee     Knee/Hip Exercises: Seated   Other Seated Knee/Hip Exercises clams 3 x 15   with red theraband     Manual Therapy   Manual therapy comments manual trigger point release along bil lumbar paraspinals x 2 ea.                PT Education - 04/24/17 0908    Education provided Yes   Education Details reprinted and reviewed HEP   Person(s) Educated Patient    Methods Explanation;Verbal cues;Handout   Comprehension Verbalized understanding;Verbal cues required          PT Short Term Goals - 04/16/17 1112      PT SHORT TERM GOAL #1   Title pt to be I with inital HEP   Time 4   Period Weeks   Status New   Target Date 05/14/17     PT SHORT TERM GOAL #2   Title pt to verbalize/ demo proper posture and lifting techniques to prevent and reduce low back pain   Time 4   Period Weeks   Status New   Target Date 05/14/17     PT SHORT TERM GOAL #3   Title pt to be able to stand for >/= 8 min to demo improving  standing tolerance with </= 6/10 pain in the low back    Time 4   Period Weeks   Status New   Target Date 05/14/17           PT Long Term Goals - 04/16/17 1151      PT LONG TERM GOAL #1   Title pt to increase bil LE strength to >/= 4/5 to promote knee/ hip stability with walking/ standing activities   Time 8   Period Weeks   Status New   Target Date 06/11/17     PT LONG TERM GOAL #2   Title pt to increase trunk side bending by >/= 8 degrees and functional trunk mobility with </= 3/10 pain for functional mobility required for ADLs    Time 8   Period Weeks   Status New   Target Date 06/11/17     PT LONG TERM GOAL #3   Title she will be able to stand and walk for >/= 20 min and navigate >/= 15 steps with </= 4/10 pain for functional home/ community ambulation   Time 8   Period Weeks   Status New   Target Date 06/11/17     PT LONG TERM GOAL #4   Title increase FOTO to </= 49% limited to demo improvement in function    Time 8   Period Weeks   Status New   Target Date 06/11/17     PT LONG TERM GOAL #5   Title pt to be I with all HEP given as of last visit    Time 8   Period Weeks   Status New   Target Date 06/11/17               Plan - 04/24/17 0017    Clinical Impression Statement pt reported decreased pain  since evaluation, but she hasn't been consistent with HEP due to losing her exercises.  reprinted and reviewed exercises today. she performed exercises well with min cues for proper form and rest breaks. post session she reported decreased pain with standing and walking and declined modalities.    PT Next Visit Plan update HEP PRN, core strengthening, manual for low back, hip strengthening, hamstring stretching, hip / knee strengthening.    PT Home Exercise Plan hamstring stretching, lower trunk rotation, low back stretch (in sitting), posterior pelvic tilt, SAQ with ball squeeze   Consulted and Agree with Plan of Care Patient      Patient will benefit from skilled therapeutic intervention in order to improve the following deficits and impairments:  Abnormal gait, Pain, Improper body mechanics, Obesity, Postural dysfunction, Decreased range of motion, Decreased endurance, Decreased strength, Decreased activity tolerance, Decreased balance, Increased fascial restricitons, Increased muscle spasms, Decreased safety awareness, Decreased mobility  Visit Diagnosis: Chronic bilateral low back pain without sciatica  Muscle spasm of back  Chronic pain of left knee  Chronic pain of right knee  Muscle weakness (generalized)  Abnormal posture     Problem List Patient Active Problem List   Diagnosis Date Noted  . Ingrown toenail 09/26/2016  . Normocytic anemia 12/28/2014  . History of diabetes mellitus 07/27/2014  . Health care maintenance 08/25/2013  . Depression 03/03/2013  . Long-term current use of opiate analgesic 03/03/2013  . Chronic combined systolic and diastolic congestive heart failure ([place]) 10/10/2012  . CKD (chronic kidney disease) stage 3, GFR 30-59 ml/min 10/10/2012  . Gout 10/09/2012  . Hyperlipidemia 05/31/2009  . Obstructive sleep apnea 04/07/2009  . Morbid obesity with BMI of 50.0-59.9, adult ([place]) 03/22/2009  . Essential hypertension 03/22/2009   Starr Lake PT, DPT, LAT, ATC  04/24/17  9:31 AM      [facility] [address] [place], [place], [address] Phone: [phone]   Fax:  [phone]  Name: Robin Hardin MRN: [id number] Date of Birth: 10/04/1950

## 2017-04-25 NOTE — Progress Notes (Signed)
S: Robin Hardin is a 66 y.o. female reports to clinical pharmacist appointment for medication help. Patient did not bring medication bottles.  No Known Allergies Medication Sig  acetaminophen (TYLENOL) 500 MG tablet Take 2 tablets (1,000 mg total) by mouth every 8 (eight) hours as needed.  allopurinol (ZYLOPRIM) 300 MG tablet Take 1 tablet (300 mg total) by mouth daily.  ALOE PO Take 1 capsule by mouth 2 (two) times daily.  aspirin EC 81 MG EC tablet Take 1 tablet (81 mg total) by mouth daily.  buPROPion (WELLBUTRIN SR) 100 MG 12 hr tablet Take 1 tablet (100 mg total) by mouth 2 (two) times daily. Take in the morning and after lunch  carvedilol (COREG) 25 MG tablet Take 1 tablet (25 mg total) by mouth 2 (two) times daily with a meal.  DULoxetine (CYMBALTA) 60 MG capsule Take 1 capsule (60 mg total) by mouth daily.  ENTRESTO 24-26 MG TAKE 1 TABLET BY MOUTH 2 (TWO) TIMES DAILY.  ferrous sulfate 325 (65 FE) MG EC tablet Take 325 mg by mouth daily with breakfast.  furosemide (LASIX) 80 MG tablet Take 1 tablet (80 mg total) by mouth 2 (two) times daily.  hydrOXYzine (VISTARIL) 25 MG capsule Take 1 capsule (25 mg total) by mouth at bedtime.  liraglutide 18 MG/3ML SOPN Start taking 0.6 mg daily for 1 week. Then increase to 1.2 mg daily for 1 week. Increase to 1.8 mg daily.  potassium chloride SA (K-DUR,KLOR-CON) 20 MEQ tablet   rosuvastatin (CRESTOR) 20 MG tablet Take 1 tablet (20 mg total) by mouth daily.  spironolactone (ALDACTONE) 25 MG tablet Take 0.5 tablets (12.5 mg total) by mouth daily.  Vitamin D, Ergocalciferol, (DRISDOL) 50000 units CAPS capsule Take 1 capsule (50,000 Units total) by mouth every 7 (seven) days. Take 1 capsule every 7 days, until the bottle is finished   Past Medical History:  Diagnosis Date  . Acute respiratory failure with hypoxia ([place]) 09/26/2012  . Bilateral knee pain   . Cellulitis of lower leg    left  . Chronic combined systolic and diastolic congestive heart  failure ([place]) 10/10/2012   2 D echo 09/2012:  Left ventricle: Extremely poor acoustic windows limit study. Overall LVEF appears to be severely depressed. Recomm ordering limited study with contrast to further evaluate LV systolic function. The cavity size was normal. Wall thickness was normal. Doppler parameters are consistent with abnormal left ventricular relaxation (grade 1 diastolic dysfunction).   2 D echo 03/2009 :  Left ventricle: The cavity size was normal. There was mild concentric hypertrophy. Systolic function was mildly reduced. The estimated ejection fraction was in the range of 45% to 50%. Diffuse hypokinesis. Doppler parameters are consistent with abnormal left ventricular relaxation (grade 1 diastolic dysfunction).     . Chronic kidney disease 10/10/2012   Stage 2 with GFR of 64   . Depression   . Diabetes mellitus   . Gout   . H/O: hysterectomy   . Hyperlipidemia   . Hypertension   . Malignant hypertension 09/26/2012  . Morbid obesity ([place])   . Nonischemic cardiomyopathy ([place])   . Sleep apnea    on cpap  . Unspecified essential hypertension 03/22/2009   Qualifier: Diagnosis of  By: Karrie Meres RN, BSN, [name]     Social History   Social History  . Marital status: Married    Spouse name: N/A  . Number of children: 2  . Years of education: N/A   Occupational History  . retired cna Unemployed  Social History Main Topics  . Smoking status: Never Smoker  . Smokeless tobacco: Never Used  . Alcohol use No  . Drug use: No  . Sexual activity: Not Currently    Partners: Male   Other Topics Concern  . Not on file   Social History Narrative  . No narrative on file   Family History  Problem Relation Age of Onset  . Asthma Mother   . Heart disease Mother   . Stroke Mother        clotting disorders  . Asthma Brother   . Asthma Brother    O: Component Value Date/Time   CHOL 196 06/11/2016 0913   HDL 58 06/11/2016 0913   TRIG 81 06/11/2016 0913   AST 25 06/11/2016 0913    ALT 33 06/11/2016 0913   NA 142 11/05/2016 1554   NA 149 (H) 10/02/2016 1437   K 4.4 11/05/2016 1554   CL 105 11/05/2016 1554   CO2 28 11/05/2016 1554   GLUCOSE 88 11/05/2016 1554   HGBA1C 5.5 11/08/2015 1507   HGBA1C 5.9 (H) 09/26/2012 1700   BUN 22 (H) 11/05/2016 1554   BUN 22 10/02/2016 1437   CREATININE 1.17 (H) 11/05/2016 1554   CREATININE 1.08 02/22/2015 1648   CALCIUM 9.2 11/05/2016 1554   GFRAA 55 (L) 11/05/2016 1554   GFRAA 63 02/22/2015 1648   WBC 3.9 03/20/2017 0000   WBC 3.9 (L) 12/28/2014 1507   HGB 12.5 03/20/2017 0000   HCT 37.9 03/20/2017 0000   PLT 212 03/20/2017 0000   TSH 1.030 03/20/2017 0000   Ht Readings from Last 2 Encounters:  12/25/16 5' 3" (1.6 m)  11/21/16 5' 3" (1.6 m)   Wt Readings from Last 2 Encounters:  04/04/17 (!) 305 lb 14.4 oz (138.8 kg)  12/25/16 (!) 312 lb 8 oz (141.7 kg)   There is no height or weight on file to calculate BMI. BP Readings from Last 3 Encounters:  04/04/17 108/90  12/25/16 (!) 155/95  11/21/16 121/83   A/P: Medications were reviewed with the patient, including name, instructions, indication, goals of therapy, potential side effects, importance of adherence, and safe use. Patient reports needing help with cost. Applied for Medicare Extra Help. Provided Victoza sample  An after visit summary was provided and patient advised to follow up if any changes in condition or questions regarding medications arise.  Patient verbalized understanding by repeating back information and was advised to contact me if further medication-related questions arise.

## 2017-04-29 ENCOUNTER — Other Ambulatory Visit: Payer: Self-pay | Admitting: Pharmacist

## 2017-04-29 DIAGNOSIS — Z6841 Body Mass Index (BMI) 40.0 and over, adult: Principal | ICD-10-CM

## 2017-04-29 MED ORDER — LIRAGLUTIDE 18 MG/3ML ~~LOC~~ SOPN: 1.8000 mg | PEN_INJECTOR | Freq: Every day | SUBCUTANEOUS | 2 refills | Status: DC

## 2017-04-29 MED ORDER — LIRAGLUTIDE 18 MG/3ML ~~LOC~~ SOPN: PEN_INJECTOR | SUBCUTANEOUS | 2 refills | Status: DC

## 2017-04-30 ENCOUNTER — Ambulatory Visit: Payer: Medicare HMO | Admitting: Physical Therapy

## 2017-04-30 DIAGNOSIS — M25561 Pain in right knee: Secondary | ICD-10-CM | POA: Diagnosis not present

## 2017-04-30 DIAGNOSIS — M6281 Muscle weakness (generalized): Secondary | ICD-10-CM

## 2017-04-30 DIAGNOSIS — R293 Abnormal posture: Secondary | ICD-10-CM

## 2017-04-30 DIAGNOSIS — M545 Low back pain: Secondary | ICD-10-CM | POA: Diagnosis not present

## 2017-04-30 DIAGNOSIS — M25562 Pain in left knee: Secondary | ICD-10-CM | POA: Diagnosis not present

## 2017-04-30 DIAGNOSIS — M62830 Muscle spasm of back: Secondary | ICD-10-CM | POA: Diagnosis not present

## 2017-04-30 DIAGNOSIS — G8929 Other chronic pain: Secondary | ICD-10-CM | POA: Diagnosis not present

## 2017-04-30 NOTE — Therapy (Signed)
[place], [place], [address] Phone: [phone]   Fax:  [phone]  Physical Therapy Treatment  Patient Details  Name: Robin Hardin MRN: [id number] Date of Birth: August 25, 1950 Referring Provider: Bartholomew Crews, MD  Encounter Date: 04/30/2017      PT End of Session - 04/30/17 1022    Visit Number 3   Number of Visits 17   Date for PT Re-Evaluation 06/11/17   Authorization Type MCR: Kx mod by 15th visit, progress not by10th visit   PT Start Time 1017   PT Stop Time 1057   PT Time Calculation (min) 40 min      Past Medical History:  Diagnosis Date  . Acute respiratory failure with hypoxia ([place]) 09/26/2012  . Bilateral knee pain   . Cellulitis of lower leg    left  . Chronic combined systolic and diastolic congestive heart failure ([place]) 10/10/2012   2 D echo 09/2012:  Left ventricle: Extremely poor acoustic windows limit study. Overall LVEF appears to be severely depressed. Recomm ordering limited study with contrast to further evaluate LV systolic function. The cavity size was normal. Wall thickness was normal. Doppler parameters are consistent with abnormal left ventricular relaxation (grade 1 diastolic dysfunction).   2 D echo 03/2009 :  Left ventricle: The cavity size was normal. There was mild concentric hypertrophy. Systolic function was mildly reduced. The estimated ejection fraction was in the range of 45% to 50%. Diffuse hypokinesis. Doppler parameters are consistent with abnormal left ventricular relaxation (grade 1 diastolic dysfunction).     . Chronic kidney disease 10/10/2012   Stage 2 with GFR of 64   . Depression   . Diabetes mellitus   . Gout   . H/O: hysterectomy   . Hyperlipidemia   . Hypertension   . Malignant hypertension 09/26/2012  . Morbid obesity ([place])   . Nonischemic cardiomyopathy ([place])   . Sleep apnea    on cpap  . Unspecified essential hypertension 03/22/2009   Qualifier: Diagnosis of  By: Karrie Meres, RN, BSN, [name]      No past surgical history on file.  There were no vitals filed for this visit.      Subjective Assessment - 04/30/17 1019    Subjective I did a little of the exercises   Currently in Pain? Yes   Pain Score 3    Pain Location Back   Pain Orientation Lower   Pain Descriptors / Indicators Pressure   Pain Score 2   Pain Location Knee   Pain Orientation Left   Pain Descriptors / Indicators --  slipping                          [facility] Adult PT Treatment/Exercise - 04/30/17 0001      Lumbar Exercises: Stretches   Lower Trunk Rotation --  2 x 10 with 5 sec hold   Pelvic Tilt Limitations 10 x 5 sec      Lumbar Exercises: Seated   Long Arc Quad on Chair Strengthening;Both;2 sets;10 reps;Weights   LAQ on Chair Limitations cues for abdominal draw in   Other Seated Lumbar Exercises marching seated in chair bil alternating 2 x 12 2#  cues for abdominal draw in     Lumbar Exercises: Supine   Other Supine Lumbar Exercises ball squeeze with posterior pelvic tilt      Knee/Hip Exercises: Stretches   Other Knee/Hip Stretches Passive hamstring, knee to chest,  piriformis stretching bilateral.     Knee/Hip Exercises: Seated   Long Arc Quad --   Other Seated Knee/Hip Exercises clams 3 x 15   with red theraband, cues for abdominal draw in                  PT Short Term Goals - 04/16/17 1112      PT SHORT TERM GOAL #1   Title pt to be I with inital HEP   Time 4   Period Weeks   Status New   Target Date 05/14/17     PT SHORT TERM GOAL #2   Title pt to verbalize/ demo proper posture and lifting techniques to prevent and reduce low back pain   Time 4   Period Weeks   Status New   Target Date 05/14/17     PT SHORT TERM GOAL #3   Title pt to be able to stand for >/= 8 min to demo improving  standing tolerance with </= 6/10 pain in the low back    Time 4   Period Weeks   Status New   Target Date  05/14/17           PT Long Term Goals - 04/16/17 1151      PT LONG TERM GOAL #1   Title pt to increase bil LE strength to >/= 4/5 to promote knee/ hip stability with walking/ standing activities   Time 8   Period Weeks   Status New   Target Date 06/11/17     PT LONG TERM GOAL #2   Title pt to increase trunk side bending by >/= 8 degrees and functional trunk mobility with </= 3/10 pain for functional mobility required for ADLs    Time 8   Period Weeks   Status New   Target Date 06/11/17     PT LONG TERM GOAL #3   Title she will be able to stand and walk for >/= 20 min and navigate >/= 15 steps with </= 4/10 pain for functional home/ community ambulation   Time 8   Period Weeks   Status New   Target Date 06/11/17     PT LONG TERM GOAL #4   Title increase FOTO to </= 49% limited to demo improvement in function    Time 8   Period Weeks   Status New   Target Date 06/11/17     PT LONG TERM GOAL #5   Title pt to be I with all HEP given as of last visit    Time 8   Period Weeks   Status New   Target Date 06/11/17               Plan - 04/30/17 1115    Clinical Impression Statement Pt reports trying the HEP a little. She reports some back pressure walking to other end of gym today and her left knee is slipping a little. Otherwise she reports no pain today. Continued seated and supine core and hip strengthening with cues for core engagement. Performed pasive stretching to hips. Afterward patient reports feeling good.    PT Next Visit Plan update HEP PRN, core strengthening, manual for low back, hip strengthening, hamstring stretching, hip / knee strengthening.    PT Home Exercise Plan hamstring stretching, lower trunk rotation, low back stretch (in sitting), posterior pelvic tilt, SAQ with ball squeeze   Consulted and Agree with Plan of Care Patient      Patient will benefit  from skilled therapeutic intervention in order to improve the following deficits and  impairments:  Abnormal gait, Pain, Improper body mechanics, Obesity, Postural dysfunction, Decreased range of motion, Decreased endurance, Decreased strength, Decreased activity tolerance, Decreased balance, Increased fascial restricitons, Increased muscle spasms, Decreased safety awareness, Decreased mobility  Visit Diagnosis: Chronic bilateral low back pain without sciatica  Muscle spasm of back  Chronic pain of left knee  Chronic pain of right knee  Muscle weakness (generalized)  Abnormal posture     Problem List Patient Active Problem List   Diagnosis Date Noted  . Ingrown toenail 09/26/2016  . Normocytic anemia 12/28/2014  . History of diabetes mellitus 07/27/2014  . Health care maintenance 08/25/2013  . Depression 03/03/2013  . Long-term current use of opiate analgesic 03/03/2013  . Chronic combined systolic and diastolic congestive heart failure ([place]) 10/10/2012  . CKD (chronic kidney disease) stage 3, GFR 30-59 ml/min 10/10/2012  . Gout 10/09/2012  . Hyperlipidemia 05/31/2009  . Obstructive sleep apnea 04/07/2009  . Morbid obesity with BMI of 50.0-59.9, adult ([place]) 03/22/2009  . Essential hypertension 03/22/2009    Dorene Ar , PTA 04/30/2017, 11:31 AM  [facility] [address] [place], [place], [address] Phone: [phone]   Fax:  [phone]  Name: Lonya Johannesen MRN: [id number] Date of Birth: 12-Feb-1951

## 2017-05-01 ENCOUNTER — Other Ambulatory Visit: Payer: Self-pay | Admitting: Internal Medicine

## 2017-05-01 ENCOUNTER — Ambulatory Visit: Payer: Medicare HMO | Admitting: Physical Therapy

## 2017-05-01 NOTE — Telephone Encounter (Signed)
Pls call Geoffery Spruce at pharmacy regarding medications

## 2017-05-02 NOTE — Telephone Encounter (Signed)
Verified most recent liraglutide script for pharmacy

## 2017-05-07 ENCOUNTER — Ambulatory Visit: Payer: Medicare HMO | Admitting: Physical Therapy

## 2017-05-07 DIAGNOSIS — G8929 Other chronic pain: Secondary | ICD-10-CM | POA: Diagnosis not present

## 2017-05-07 DIAGNOSIS — M545 Low back pain, unspecified: Secondary | ICD-10-CM

## 2017-05-07 DIAGNOSIS — M25561 Pain in right knee: Secondary | ICD-10-CM

## 2017-05-07 DIAGNOSIS — M25562 Pain in left knee: Secondary | ICD-10-CM | POA: Diagnosis not present

## 2017-05-07 DIAGNOSIS — M6281 Muscle weakness (generalized): Secondary | ICD-10-CM | POA: Diagnosis not present

## 2017-05-07 DIAGNOSIS — M62830 Muscle spasm of back: Secondary | ICD-10-CM | POA: Diagnosis not present

## 2017-05-07 DIAGNOSIS — R293 Abnormal posture: Secondary | ICD-10-CM

## 2017-05-07 NOTE — Therapy (Signed)
[facility] [place], [place], [address] Phone: [phone]   Fax:  [phone]  Physical Therapy Treatment  Patient Details  Name: Robin Hardin MRN: [id number] Date of Birth: 1951-03-05 Referring Provider: Bartholomew Crews, MD  Encounter Date: 05/07/2017      PT End of Session - 05/07/17 1107    Visit Number 4   Number of Visits 17   Date for PT Re-Evaluation 06/11/17   Authorization Type MCR: Kx mod by 15th visit, progress not by10th visit   PT Start Time 1058   PT Stop Time 1143   PT Time Calculation (min) 45 min      Past Medical History:  Diagnosis Date  . Acute respiratory failure with hypoxia ([place]) 09/26/2012  . Bilateral knee pain   . Cellulitis of lower leg    left  . Chronic combined systolic and diastolic congestive heart failure ([place]) 10/10/2012   2 D echo 09/2012:  Left ventricle: Extremely poor acoustic windows limit study. Overall LVEF appears to be severely depressed. Recomm ordering limited study with contrast to further evaluate LV systolic function. The cavity size was normal. Wall thickness was normal. Doppler parameters are consistent with abnormal left ventricular relaxation (grade 1 diastolic dysfunction).   2 D echo 03/2009 :  Left ventricle: The cavity size was normal. There was mild concentric hypertrophy. Systolic function was mildly reduced. The estimated ejection fraction was in the range of 45% to 50%. Diffuse hypokinesis. Doppler parameters are consistent with abnormal left ventricular relaxation (grade 1 diastolic dysfunction).     . Chronic kidney disease 10/10/2012   Stage 2 with GFR of 64   . Depression   . Diabetes mellitus   . Gout   . H/O: hysterectomy   . Hyperlipidemia   . Hypertension   . Malignant hypertension 09/26/2012  . Morbid obesity ([place])   . Nonischemic cardiomyopathy ([place])   . Sleep apnea    on cpap  . Unspecified essential hypertension 03/22/2009   Qualifier: Diagnosis of  By: Karrie Meres, RN, BSN, [name]      No past surgical history on file.  There were no vitals filed for this visit.      Subjective Assessment - 05/07/17 1103    Subjective Back feels a lot better than it used to. The left knee hurts some.    Currently in Pain? Yes   Pain Score 6    Pain Location Back   Pain Orientation Lower   Pain Descriptors / Indicators Pressure  pulling    Aggravating Factors  standing    Pain Relieving Factors sitting down and resting    Pain Score 0  up to 10/10   Pain Location Knee   Pain Orientation Left   Pain Descriptors / Indicators --  excruciating when it hurts    Aggravating Factors  first get up and trying to shower, going down stairs    Pain Relieving Factors hamstring curls, working it out                          [facility] Adult PT Treatment/Exercise - 05/07/17 0001      Lumbar Exercises: Stretches   Pelvic Tilt Limitations 10 x 5 sec    Quadruped Mid Back Stretch Limitations from seated, physioball roll outs x5 forward and lateral      Lumbar Exercises: Seated   Long Arc Quad on Chair Strengthening;Both;2 sets;10 reps;Weights  green band  LAQ on Chair Weights (lbs) --   LAQ on Chair Limitations cues for abdominal draw in   Other Seated Lumbar Exercises marching seated in chair bil alternating 2 x 12 green band around knees , also ball squeeze x 10   cues for abdominal draw in     Lumbar Exercises: Supine   Bridge 10 reps   Straight Leg Raise 10 reps   Other Supine Lumbar Exercises ball squeeze with posterior pelvic tilt      Knee/Hip Exercises: Stretches   Other Knee/Hip Stretches Passive hamstring, knee to chest, piriformis stretching bilateral.     Knee/Hip Exercises: Seated   Long Arc Quad Limitations with ball squeeze x 20, alternating     Other Seated Knee/Hip Exercises clams 3 x 15   with red theraband, cues for abdominal draw in                  PT Short Term Goals -  04/16/17 1112      PT SHORT TERM GOAL #1   Title pt to be I with inital HEP   Time 4   Period Weeks   Status New   Target Date 05/14/17     PT SHORT TERM GOAL #2   Title pt to verbalize/ demo proper posture and lifting techniques to prevent and reduce low back pain   Time 4   Period Weeks   Status New   Target Date 05/14/17     PT SHORT TERM GOAL #3   Title pt to be able to stand for >/= 8 min to demo improving  standing tolerance with </= 6/10 pain in the low back    Time 4   Period Weeks   Status New   Target Date 05/14/17           PT Long Term Goals - 04/16/17 1151      PT LONG TERM GOAL #1   Title pt to increase bil LE strength to >/= 4/5 to promote knee/ hip stability with walking/ standing activities   Time 8   Period Weeks   Status New   Target Date 06/11/17     PT LONG TERM GOAL #2   Title pt to increase trunk side bending by >/= 8 degrees and functional trunk mobility with </= 3/10 pain for functional mobility required for ADLs    Time 8   Period Weeks   Status New   Target Date 06/11/17     PT LONG TERM GOAL #3   Title she will be able to stand and walk for >/= 20 min and navigate >/= 15 steps with </= 4/10 pain for functional home/ community ambulation   Time 8   Period Weeks   Status New   Target Date 06/11/17     PT LONG TERM GOAL #4   Title increase FOTO to </= 49% limited to demo improvement in function    Time 8   Period Weeks   Status New   Target Date 06/11/17     PT LONG TERM GOAL #5   Title pt to be I with all HEP given as of last visit    Time 8   Period Weeks   Status New   Target Date 06/11/17               Plan - 05/07/17 1108    Clinical Impression Statement Pt reports standing limited to 3 minutes. She does report the intensity of pain is not  as severe in her low back. Her knee seems out of place every morning and is severely painful. SHe does a series of exercises to make it feel better., Walking into clinic today  she reports no knee pain. We coninued seated and supine core, hp and knee strengthening with pt reporting  fatigue, no increased pain.    PT Next Visit Plan update HEP PRN, core strengthening, manual for low back, hip strengthening, hamstring stretching, hip / knee strengthening.    PT Home Exercise Plan hamstring stretching, lower trunk rotation, low back stretch (in sitting), posterior pelvic tilt, SAQ with ball squeeze   Consulted and Agree with Plan of Care Patient      Patient will benefit from skilled therapeutic intervention in order to improve the following deficits and impairments:  Abnormal gait, Pain, Improper body mechanics, Obesity, Postural dysfunction, Decreased range of motion, Decreased endurance, Decreased strength, Decreased activity tolerance, Decreased balance, Increased fascial restricitons, Increased muscle spasms, Decreased safety awareness, Decreased mobility  Visit Diagnosis: Chronic bilateral low back pain without sciatica  Muscle spasm of back  Chronic pain of left knee  Chronic pain of right knee  Muscle weakness (generalized)  Abnormal posture     Problem List Patient Active Problem List   Diagnosis Date Noted  . Ingrown toenail 09/26/2016  . Normocytic anemia 12/28/2014  . History of diabetes mellitus 07/27/2014  . Health care maintenance 08/25/2013  . Depression 03/03/2013  . Long-term current use of opiate analgesic 03/03/2013  . Chronic combined systolic and diastolic congestive heart failure ([place]) 10/10/2012  . CKD (chronic kidney disease) stage 3, GFR 30-59 ml/min 10/10/2012  . Gout 10/09/2012  . Hyperlipidemia 05/31/2009  . Obstructive sleep apnea 04/07/2009  . Morbid obesity with BMI of 50.0-59.9, adult ([place]) 03/22/2009  . Essential hypertension 03/22/2009    Dorene Ar, PTA 05/07/2017, 12:17 PM  [facility] [address] [place], [place], [address] Phone: [phone]    Fax:  [phone]  Name: Akirra Lacerda MRN: [id number] Date of Birth: 1951-07-05

## 2017-05-09 ENCOUNTER — Ambulatory Visit: Payer: Medicare HMO | Admitting: Physical Therapy

## 2017-05-14 ENCOUNTER — Ambulatory Visit: Payer: Medicare HMO | Admitting: Physical Therapy

## 2017-05-14 DIAGNOSIS — M545 Low back pain, unspecified: Secondary | ICD-10-CM

## 2017-05-14 DIAGNOSIS — G8929 Other chronic pain: Secondary | ICD-10-CM | POA: Diagnosis not present

## 2017-05-14 DIAGNOSIS — M6281 Muscle weakness (generalized): Secondary | ICD-10-CM | POA: Diagnosis not present

## 2017-05-14 DIAGNOSIS — M62830 Muscle spasm of back: Secondary | ICD-10-CM | POA: Diagnosis not present

## 2017-05-14 DIAGNOSIS — M25561 Pain in right knee: Secondary | ICD-10-CM

## 2017-05-14 DIAGNOSIS — M25562 Pain in left knee: Secondary | ICD-10-CM

## 2017-05-14 DIAGNOSIS — R293 Abnormal posture: Secondary | ICD-10-CM | POA: Diagnosis not present

## 2017-05-14 NOTE — Therapy (Signed)
[place], [place], [address] Phone: [phone]   Fax:  [phone]  Physical Therapy Treatment  Patient Details  Name: Robin Hardin MRN: [id number] Date of Birth: May 20, 1951 Referring Provider: Bartholomew Crews, MD  Encounter Date: 05/14/2017      PT End of Session - 05/14/17 1024    Visit Number 5   Number of Visits 17   Date for PT Re-Evaluation 06/11/17   Authorization Type MCR: Kx mod by 15th visit, progress not by10th visit   PT Start Time 1017   PT Stop Time 1102   PT Time Calculation (min) 45 min      Past Medical History:  Diagnosis Date  . Acute respiratory failure with hypoxia ([place]) 09/26/2012  . Bilateral knee pain   . Cellulitis of lower leg    left  . Chronic combined systolic and diastolic congestive heart failure ([place]) 10/10/2012   2 D echo 09/2012:  Left ventricle: Extremely poor acoustic windows limit study. Overall LVEF appears to be severely depressed. Recomm ordering limited study with contrast to further evaluate LV systolic function. The cavity size was normal. Wall thickness was normal. Doppler parameters are consistent with abnormal left ventricular relaxation (grade 1 diastolic dysfunction).   2 D echo 03/2009 :  Left ventricle: The cavity size was normal. There was mild concentric hypertrophy. Systolic function was mildly reduced. The estimated ejection fraction was in the range of 45% to 50%. Diffuse hypokinesis. Doppler parameters are consistent with abnormal left ventricular relaxation (grade 1 diastolic dysfunction).     . Chronic kidney disease 10/10/2012   Stage 2 with GFR of 64   . Depression   . Diabetes mellitus   . Gout   . H/O: hysterectomy   . Hyperlipidemia   . Hypertension   . Malignant hypertension 09/26/2012  . Morbid obesity ([place])   . Nonischemic cardiomyopathy ([place])   . Sleep apnea    on cpap  . Unspecified essential hypertension 03/22/2009   Qualifier: Diagnosis of  By: Karrie Meres, RN, BSN, [name]      No past surgical history on file.  There were no vitals filed for this visit.      Subjective Assessment - 05/14/17 1024    Subjective I can stand longer first thing in the morning.    Currently in Pain? No/denies  up to 5-6/10 with standing    Pain Score --  depends on what I am doing.    Pain Location Knee   Pain Orientation Left                         [facility] Adult PT Treatment/Exercise - 05/14/17 0001      Lumbar Exercises: Stretches   Lower Trunk Rotation --  2 x 10 with 5 sec hold   Pelvic Tilt Limitations 10 x 5 sec    Quadruped Mid Back Stretch Limitations from seated, physioball roll outs x5 forward and lateral      Lumbar Exercises: Seated   Long Arc Quad on Chair Strengthening;Both;2 sets;10 reps;Weights  3#   LAQ on Chair Limitations cues for abdominal draw in   Other Seated Lumbar Exercises marching 3# x 20 alternating with abdominal draw in , also ball squeeze with abdominal draw in      Lumbar Exercises: Supine   Clam 20 reps  green band    Clam Limitations with abdominal draw in, cues for breathing    Bridge  10 reps   Straight Leg Raise 10 reps   Other Supine Lumbar Exercises physioball isometric abdominals x 10      Knee/Hip Exercises: Aerobic   Nustep 10 min L3 LE only      Manual Therapy   Manual therapy comments Trigger point release and IASTM with tennis ball to bilaterateral lumbar paraspinals-distal, left worse.   during forward seated physioball roll out                   PT Short Term Goals - 04/16/17 1112      PT SHORT TERM GOAL #1   Title pt to be I with inital HEP   Time 4   Period Weeks   Status New   Target Date 05/14/17     PT SHORT TERM GOAL #2   Title pt to verbalize/ demo proper posture and lifting techniques to prevent and reduce low back pain   Time 4   Period Weeks   Status New   Target Date 05/14/17     PT SHORT TERM GOAL #3    Title pt to be able to stand for >/= 8 min to demo improving  standing tolerance with </= 6/10 pain in the low back    Time 4   Period Weeks   Status New   Target Date 05/14/17           PT Long Term Goals - 04/16/17 1151      PT LONG TERM GOAL #1   Title pt to increase bil LE strength to >/= 4/5 to promote knee/ hip stability with walking/ standing activities   Time 8   Period Weeks   Status New   Target Date 06/11/17     PT LONG TERM GOAL #2   Title pt to increase trunk side bending by >/= 8 degrees and functional trunk mobility with </= 3/10 pain for functional mobility required for ADLs    Time 8   Period Weeks   Status New   Target Date 06/11/17     PT LONG TERM GOAL #3   Title she will be able to stand and walk for >/= 20 min and navigate >/= 15 steps with </= 4/10 pain for functional home/ community ambulation   Time 8   Period Weeks   Status New   Target Date 06/11/17     PT LONG TERM GOAL #4   Title increase FOTO to </= 49% limited to demo improvement in function    Time 8   Period Weeks   Status New   Target Date 06/11/17     PT LONG TERM GOAL #5   Title pt to be I with all HEP given as of last visit    Time 8   Period Weeks   Status New   Target Date 06/11/17               Plan - 05/14/17 1113    Clinical Impression Statement Standing limited to 3-5 minutes. She must push herself to get ready in the morning and make it down her stairs to sit in her wheelchair. Recommended short ambulations for 1-2 minutes more frequently througout the day without allowing low back pain to increase much. Manual trigger point release to distal lumbar paraspinals with left being more tender. Continued core/hip and knee strengthening as tolerated.    PT Next Visit Plan PREFERS PRIVATE ROOM AFTER NUSTEP : update HEP PRN, core strengthening, manual for low back,  hip strengthening, hamstring stretching, hip / knee strengthening.    PT Home Exercise Plan hamstring  stretching, lower trunk rotation, low back stretch (in sitting), posterior pelvic tilt, SAQ with ball squeeze   Consulted and Agree with Plan of Care Patient      Patient will benefit from skilled therapeutic intervention in order to improve the following deficits and impairments:  Abnormal gait, Pain, Improper body mechanics, Obesity, Postural dysfunction, Decreased range of motion, Decreased endurance, Decreased strength, Decreased activity tolerance, Decreased balance, Increased fascial restricitons, Increased muscle spasms, Decreased safety awareness, Decreased mobility  Visit Diagnosis: Chronic bilateral low back pain without sciatica  Muscle spasm of back  Chronic pain of left knee  Muscle weakness (generalized)  Abnormal posture  Chronic pain of right knee     Problem List Patient Active Problem List   Diagnosis Date Noted  . Ingrown toenail 09/26/2016  . Normocytic anemia 12/28/2014  . History of diabetes mellitus 07/27/2014  . Health care maintenance 08/25/2013  . Depression 03/03/2013  . Long-term current use of opiate analgesic 03/03/2013  . Chronic combined systolic and diastolic congestive heart failure ([place]) 10/10/2012  . CKD (chronic kidney disease) stage 3, GFR 30-59 ml/min 10/10/2012  . Gout 10/09/2012  . Hyperlipidemia 05/31/2009  . Obstructive sleep apnea 04/07/2009  . Morbid obesity with BMI of 50.0-59.9, adult ([place]) 03/22/2009  . Essential hypertension 03/22/2009    Dorene Ar, PTA 05/14/2017, 11:17 AM  [facility] [address] [place], [place], [address] Phone: [phone]   Fax:  [phone]  Name: Yanna Leaks MRN: [id number] Date of Birth: Oct 13, 1950

## 2017-05-16 ENCOUNTER — Ambulatory Visit: Payer: Medicare HMO | Admitting: Physical Therapy

## 2017-05-16 ENCOUNTER — Encounter: Payer: Self-pay | Admitting: Physical Therapy

## 2017-05-16 DIAGNOSIS — R293 Abnormal posture: Secondary | ICD-10-CM | POA: Diagnosis not present

## 2017-05-16 DIAGNOSIS — M545 Low back pain, unspecified: Secondary | ICD-10-CM

## 2017-05-16 DIAGNOSIS — M62830 Muscle spasm of back: Secondary | ICD-10-CM

## 2017-05-16 DIAGNOSIS — G8929 Other chronic pain: Secondary | ICD-10-CM

## 2017-05-16 DIAGNOSIS — M25562 Pain in left knee: Secondary | ICD-10-CM | POA: Diagnosis not present

## 2017-05-16 DIAGNOSIS — M6281 Muscle weakness (generalized): Secondary | ICD-10-CM

## 2017-05-16 DIAGNOSIS — M25561 Pain in right knee: Secondary | ICD-10-CM | POA: Diagnosis not present

## 2017-05-16 NOTE — Therapy (Signed)
[place], [place], [address] Phone: [phone]   Fax:  [phone]  Physical Therapy Treatment  Patient Details  Name: Robin Hardin MRN: [id number] Date of Birth: Dec 18, 1950 Referring Provider: Bartholomew Crews, MD  Encounter Date: 05/16/2017      PT End of Session - 05/16/17 1027    Visit Number 6   Number of Visits 17   Date for PT Re-Evaluation 06/11/17   Authorization Type MCR: Kx mod by 15th visit, progress not by10th visit   PT Start Time 1016   PT Stop Time 1056   PT Time Calculation (min) 40 min   Activity Tolerance Patient tolerated treatment well   Behavior During Therapy [facility] for tasks assessed/performed      Past Medical History:  Diagnosis Date  . Acute respiratory failure with hypoxia ([place]) 09/26/2012  . Bilateral knee pain   . Cellulitis of lower leg    left  . Chronic combined systolic and diastolic congestive heart failure ([place]) 10/10/2012   2 D echo 09/2012:  Left ventricle: Extremely poor acoustic windows limit study. Overall LVEF appears to be severely depressed. Recomm ordering limited study with contrast to further evaluate LV systolic function. The cavity size was normal. Wall thickness was normal. Doppler parameters are consistent with abnormal left ventricular relaxation (grade 1 diastolic dysfunction).   2 D echo 03/2009 :  Left ventricle: The cavity size was normal. There was mild concentric hypertrophy. Systolic function was mildly reduced. The estimated ejection fraction was in the range of 45% to 50%. Diffuse hypokinesis. Doppler parameters are consistent with abnormal left ventricular relaxation (grade 1 diastolic dysfunction).     . Chronic kidney disease 10/10/2012   Stage 2 with GFR of 64   . Depression   . Diabetes mellitus   . Gout   . H/O: hysterectomy   . Hyperlipidemia   . Hypertension   . Malignant hypertension 09/26/2012  . Morbid obesity ([place])   .  Nonischemic cardiomyopathy ([place])   . Sleep apnea    on cpap  . Unspecified essential hypertension 03/22/2009   Qualifier: Diagnosis of  By: Karrie Meres RN, BSN, [name]      History reviewed. No pertinent surgical history.  There were no vitals filed for this visit.      Subjective Assessment - 05/16/17 1021    Subjective "Im not having any pain today"    Currently in Pain? No/denies   Pain Location Back   Aggravating Factors  prolonged standing   Pain Score 0   Pain Location Knee   Pain Orientation Left   Aggravating Factors  standing for long period of time                         [name] Adult PT Treatment/Exercise - 05/16/17 1022      Knee/Hip Exercises: Aerobic   Nustep L5 x 6 min LE only     Knee/Hip Exercises: Standing   SLS with Vectors 2 x 10 with flexion/ abduction/ extension  in // , halted exercise due to pt reporting pain with abd     Knee/Hip Exercises: Seated   Other Seated Knee/Hip Exercises clams 3 x 15   with green theraband   Sit to Sand 1 set;10 reps;without UE support  with band around the knees to promote glute med activation                PT Education - 05/16/17  1055    Education provided Yes   Education Details updated HEP with proper form   Person(s) Educated Patient   Methods Explanation;Verbal cues;Handout   Comprehension Verbalized understanding;Verbal cues required          PT Short Term Goals - 05/16/17 1105      PT SHORT TERM GOAL #1   Title pt to be I with inital HEP   Period Weeks   Status Achieved     PT SHORT TERM GOAL #2   Title pt to verbalize/ demo proper posture and lifting techniques to prevent and reduce low back pain   Time 4   Period Weeks   Status Partially Met     PT SHORT TERM GOAL #3   Title pt to be able to stand for >/= 8 min to demo improving  standing tolerance with </= 6/10 pain in the low back    Time 4   Period Weeks   Status Partially Met           PT Long Term Goals -  04/16/17 1151      PT LONG TERM GOAL #1   Title pt to increase bil LE strength to >/= 4/5 to promote knee/ hip stability with walking/ standing activities   Time 8   Period Weeks   Status New   Target Date 06/11/17     PT LONG TERM GOAL #2   Title pt to increase trunk side bending by >/= 8 degrees and functional trunk mobility with </= 3/10 pain for functional mobility required for ADLs    Time 8   Period Weeks   Status New   Target Date 06/11/17     PT LONG TERM GOAL #3   Title she will be able to stand and walk for >/= 20 min and navigate >/= 15 steps with </= 4/10 pain for functional home/ community ambulation   Time 8   Period Weeks   Status New   Target Date 06/11/17     PT LONG TERM GOAL #4   Title increase FOTO to </= 49% limited to demo improvement in function    Time 8   Period Weeks   Status New   Target Date 06/11/17     PT LONG TERM GOAL #5   Title pt to be I with all HEP given as of last visit    Time 8   Period Weeks   Status New   Target Date 06/11/17               Plan - 05/16/17 1056    Clinical Impression Statement pt reported no pain coming into treatment today but continues to report pain in the hips with standing and walking. had pt stand to recreate pain which she reported pain in the hip with SLS with vectors, following exercise she reported decreased pain.    PT Treatment/Interventions ADLs/Self Care Home Management;Cryotherapy;Electrical Stimulation;Iontophoresis 73m/ml Dexamethasone;Moist Heat;Ultrasound;Therapeutic activities;Therapeutic exercise;Dry needling;Taping;Manual techniques;Neuromuscular re-education;Patient/family education;Gait training;DME Instruction;Passive range of motion;Stair training;Balance training   PT Next Visit Plan PREFERS PRIVATE ROOM AFTER NUSTEP : update HEP PRN, core strengthening, manual for low back, hip strengthening, hamstring stretching, hip / knee strengthening. endurance traning   PT Home Exercise Plan  hamstring stretching, lower trunk rotation, low back stretch (in sitting), posterior pelvic tilt, SAQ with ball squeeze, sit to stand, seated/ standing pelvic tilts, QL stretching   Consulted and Agree with Plan of Care Patient      Patient will  benefit from skilled therapeutic intervention in order to improve the following deficits and impairments:  Abnormal gait, Pain, Improper body mechanics, Obesity, Postural dysfunction, Decreased range of motion, Decreased endurance, Decreased strength, Decreased activity tolerance, Decreased balance, Increased fascial restricitons, Increased muscle spasms, Decreased safety awareness, Decreased mobility  Visit Diagnosis: Chronic bilateral low back pain without sciatica  Muscle spasm of back  Chronic pain of left knee  Muscle weakness (generalized)  Abnormal posture  Chronic pain of right knee     Problem List Patient Active Problem List   Diagnosis Date Noted  . Ingrown toenail 09/26/2016  . Normocytic anemia 12/28/2014  . History of diabetes mellitus 07/27/2014  . Health care maintenance 08/25/2013  . Depression 03/03/2013  . Long-term current use of opiate analgesic 03/03/2013  . Chronic combined systolic and diastolic congestive heart failure ([place]) 10/10/2012  . CKD (chronic kidney disease) stage 3, GFR 30-59 ml/min 10/10/2012  . Gout 10/09/2012  . Hyperlipidemia 05/31/2009  . Obstructive sleep apnea 04/07/2009  . Morbid obesity with BMI of 50.0-59.9, adult ([place]) 03/22/2009  . Essential hypertension 03/22/2009   Starr Lake PT, DPT, LAT, ATC  05/16/17  11:07 AM      [place] [facility] [address] [place], [place], [address] Phone: [phone]   Fax:  [phone]  Name: Elica Almas MRN: [id number] Date of Birth: 1951-06-08

## 2017-05-20 ENCOUNTER — Ambulatory Visit: Payer: Medicare HMO | Attending: Internal Medicine | Admitting: Physical Therapy

## 2017-05-20 DIAGNOSIS — M62830 Muscle spasm of back: Secondary | ICD-10-CM | POA: Diagnosis not present

## 2017-05-20 DIAGNOSIS — R293 Abnormal posture: Secondary | ICD-10-CM | POA: Insufficient documentation

## 2017-05-20 DIAGNOSIS — M6281 Muscle weakness (generalized): Secondary | ICD-10-CM | POA: Insufficient documentation

## 2017-05-20 DIAGNOSIS — M25561 Pain in right knee: Secondary | ICD-10-CM | POA: Diagnosis not present

## 2017-05-20 DIAGNOSIS — G8929 Other chronic pain: Secondary | ICD-10-CM | POA: Insufficient documentation

## 2017-05-20 DIAGNOSIS — M545 Low back pain: Secondary | ICD-10-CM | POA: Insufficient documentation

## 2017-05-20 DIAGNOSIS — M25562 Pain in left knee: Secondary | ICD-10-CM | POA: Diagnosis not present

## 2017-05-20 NOTE — Therapy (Signed)
[place], [place], [address] Phone: [phone]   Fax:  [phone]  Physical Therapy Treatment  Patient Details  Name: Robin Hardin MRN: [id number] Date of Birth: 06-Feb-1951 Referring Provider: Bartholomew Crews, MD  Encounter Date: 05/20/2017      PT End of Session - 05/20/17 1025    Visit Number 7   Number of Visits 17   Date for PT Re-Evaluation 06/11/17   Authorization Type MCR: Kx mod by 15th visit, progress not by10th visit   PT Start Time 1017   PT Stop Time 1056   PT Time Calculation (min) 39 min      Past Medical History:  Diagnosis Date  . Acute respiratory failure with hypoxia ([place]) 09/26/2012  . Bilateral knee pain   . Cellulitis of lower leg    left  . Chronic combined systolic and diastolic congestive heart failure ([place]) 10/10/2012   2 D echo 09/2012:  Left ventricle: Extremely poor acoustic windows limit study. Overall LVEF appears to be severely depressed. Recomm ordering limited study with contrast to further evaluate LV systolic function. The cavity size was normal. Wall thickness was normal. Doppler parameters are consistent with abnormal left ventricular relaxation (grade 1 diastolic dysfunction).   2 D echo 03/2009 :  Left ventricle: The cavity size was normal. There was mild concentric hypertrophy. Systolic function was mildly reduced. The estimated ejection fraction was in the range of 45% to 50%. Diffuse hypokinesis. Doppler parameters are consistent with abnormal left ventricular relaxation (grade 1 diastolic dysfunction).     . Chronic kidney disease 10/10/2012   Stage 2 with GFR of 64   . Depression   . Diabetes mellitus   . Gout   . H/O: hysterectomy   . Hyperlipidemia   . Hypertension   . Malignant hypertension 09/26/2012  . Morbid obesity ([place])   . Nonischemic cardiomyopathy ([place])   . Sleep apnea    on cpap  . Unspecified essential hypertension 03/22/2009   Qualifier: Diagnosis of  By: Karrie Meres, RN, BSN, [name]      No past surgical history on file.  There were no vitals filed for this visit.                       [facility] Adult PT Treatment/Exercise - 05/20/17 0001      Lumbar Exercises: Stretches   Active Hamstring Stretch 2 reps;30 seconds   Pelvic Tilt Limitations 10 x 5 sec      Knee/Hip Exercises: Clinical research associate Limitations seated with strap      Knee/Hip Exercises: Aerobic   Nustep L4  x 8 min LE only     Knee/Hip Exercises: Standing   Heel Raises 2 sets;10 reps   Heel Raises Limitations toe raises x 10 x 2    Knee Flexion 10 reps   SLS with Vectors 2 x 10 with flexion/ abduction/ extension  in parallel bars    Other Standing Knee Exercises side stepping x 2 , retro walking x 2 in parallel bars      Knee/Hip Exercises: Seated   Other Seated Knee/Hip Exercises clams 3 x 15 , seated pelvic tilt   with green theraband   Sit to Sand 1 set;10 reps;without UE support  with band around the knees to promote glute med activation                  PT Short Term Goals -  05/16/17 1105      PT SHORT TERM GOAL #1   Title pt to be I with inital HEP   Period Weeks   Status Achieved     PT SHORT TERM GOAL #2   Title pt to verbalize/ demo proper posture and lifting techniques to prevent and reduce low back pain   Time 4   Period Weeks   Status Partially Met     PT SHORT TERM GOAL #3   Title pt to be able to stand for >/= 8 min to demo improving  standing tolerance with </= 6/10 pain in the low back    Time 4   Period Weeks   Status Partially Met           PT Long Term Goals - 04/16/17 1151      PT LONG TERM GOAL #1   Title pt to increase bil LE strength to >/= 4/5 to promote knee/ hip stability with walking/ standing activities   Time 8   Period Weeks   Status New   Target Date 06/11/17     PT LONG TERM GOAL #2   Title pt to increase trunk side bending by >/= 8 degrees and  functional trunk mobility with </= 3/10 pain for functional mobility required for ADLs    Time 8   Period Weeks   Status New   Target Date 06/11/17     PT LONG TERM GOAL #3   Title she will be able to stand and walk for >/= 20 min and navigate >/= 15 steps with </= 4/10 pain for functional home/ community ambulation   Time 8   Period Weeks   Status New   Target Date 06/11/17     PT LONG TERM GOAL #4   Title increase FOTO to </= 49% limited to demo improvement in function    Time 8   Period Weeks   Status New   Target Date 06/11/17     PT LONG TERM GOAL #5   Title pt to be I with all HEP given as of last visit    Time 8   Period Weeks   Status New   Target Date 06/11/17               Plan - 05/20/17 1025    Clinical Impression Statement Standing improved to 7 minutes with less pressure and pain at home. Has not done any HEP this weekend due to being busy. Demonstrates improved tolerance to standing therex without c/o pain today. Encouraged pt to find time for HEP to maximize outcomes.     PT Next Visit Plan PREFERS PRIVATE ROOM AFTER NUSTEP : update HEP PRN, core strengthening, manual for low back, hip strengthening, hamstring stretching, hip / knee strengthening. endurance traning   PT Home Exercise Plan hamstring stretching, lower trunk rotation, low back stretch (in sitting), posterior pelvic tilt, SAQ with ball squeeze, sit to stand, seated/ standing pelvic tilts, QL stretching   Consulted and Agree with Plan of Care Patient      Patient will benefit from skilled therapeutic intervention in order to improve the following deficits and impairments:  Abnormal gait, Pain, Improper body mechanics, Obesity, Postural dysfunction, Decreased range of motion, Decreased endurance, Decreased strength, Decreased activity tolerance, Decreased balance, Increased fascial restricitons, Increased muscle spasms, Decreased safety awareness, Decreased mobility  Visit Diagnosis: Muscle  spasm of back  Chronic bilateral low back pain without sciatica  Chronic pain of left knee  Muscle  weakness (generalized)  Abnormal posture  Chronic pain of right knee     Problem List Patient Active Problem List   Diagnosis Date Noted  . Ingrown toenail 09/26/2016  . Normocytic anemia 12/28/2014  . History of diabetes mellitus 07/27/2014  . Health care maintenance 08/25/2013  . Depression 03/03/2013  . Long-term current use of opiate analgesic 03/03/2013  . Chronic combined systolic and diastolic congestive heart failure ([place]) 10/10/2012  . CKD (chronic kidney disease) stage 3, GFR 30-59 ml/min ([facility]) 10/10/2012  . Gout 10/09/2012  . Hyperlipidemia 05/31/2009  . Obstructive sleep apnea 04/07/2009  . Morbid obesity with BMI of 50.0-59.9, adult ([place]) 03/22/2009  . Essential hypertension 03/22/2009    Dorene Ar , PTA 05/20/2017, 1:52 PM  [facility] [address] [place], [place], [address] Phone: [phone]   Fax:  [phone]  Name: Jacquelene Kopecky MRN: [id number] Date of Birth: 19-Feb-1951

## 2017-05-21 ENCOUNTER — Encounter: Payer: Self-pay | Admitting: Internal Medicine

## 2017-05-21 ENCOUNTER — Ambulatory Visit (INDEPENDENT_AMBULATORY_CARE_PROVIDER_SITE_OTHER): Payer: Medicare HMO | Admitting: Internal Medicine

## 2017-05-21 VITALS — BP 134/87 | HR 82 | Temp 98.4°F | Ht 63.0 in | Wt 311.4 lb

## 2017-05-21 DIAGNOSIS — Z23 Encounter for immunization: Secondary | ICD-10-CM | POA: Diagnosis not present

## 2017-05-21 DIAGNOSIS — E785 Hyperlipidemia, unspecified: Secondary | ICD-10-CM | POA: Diagnosis not present

## 2017-05-21 DIAGNOSIS — I10 Essential (primary) hypertension: Secondary | ICD-10-CM

## 2017-05-21 DIAGNOSIS — Z6841 Body Mass Index (BMI) 40.0 and over, adult: Secondary | ICD-10-CM

## 2017-05-21 DIAGNOSIS — N182 Chronic kidney disease, stage 2 (mild): Secondary | ICD-10-CM

## 2017-05-21 DIAGNOSIS — F32A Depression, unspecified: Secondary | ICD-10-CM

## 2017-05-21 DIAGNOSIS — I509 Heart failure, unspecified: Secondary | ICD-10-CM

## 2017-05-21 DIAGNOSIS — I130 Hypertensive heart and chronic kidney disease with heart failure and stage 1 through stage 4 chronic kidney disease, or unspecified chronic kidney disease: Secondary | ICD-10-CM | POA: Diagnosis not present

## 2017-05-21 DIAGNOSIS — G8929 Other chronic pain: Secondary | ICD-10-CM | POA: Diagnosis not present

## 2017-05-21 DIAGNOSIS — Z79891 Long term (current) use of opiate analgesic: Secondary | ICD-10-CM

## 2017-05-21 DIAGNOSIS — F329 Major depressive disorder, single episode, unspecified: Secondary | ICD-10-CM

## 2017-05-21 DIAGNOSIS — G4733 Obstructive sleep apnea (adult) (pediatric): Secondary | ICD-10-CM | POA: Diagnosis not present

## 2017-05-21 DIAGNOSIS — E559 Vitamin D deficiency, unspecified: Secondary | ICD-10-CM | POA: Insufficient documentation

## 2017-05-21 MED ORDER — ALLOPURINOL 300 MG PO TABS: 300.0000 mg | ORAL_TABLET | Freq: Every day | ORAL | 1 refills | Status: DC

## 2017-05-21 MED ORDER — DULOXETINE HCL 60 MG PO CPEP: 60.0000 mg | ORAL_CAPSULE | Freq: Every day | ORAL | 1 refills | Status: DC

## 2017-05-21 MED ORDER — SACUBITRIL-VALSARTAN 24-26 MG PO TABS: 1.0000 | ORAL_TABLET | Freq: Two times a day (BID) | ORAL | 3 refills | Status: DC

## 2017-05-21 MED ORDER — BUPROPION HCL ER (SR) 100 MG PO TB12: 100.0000 mg | ORAL_TABLET | Freq: Two times a day (BID) | ORAL | 3 refills | Status: DC

## 2017-05-21 MED ORDER — ROSUVASTATIN CALCIUM 20 MG PO TABS: 20.0000 mg | ORAL_TABLET | Freq: Every day | ORAL | 11 refills | Status: DC

## 2017-05-21 MED ORDER — FUROSEMIDE 80 MG PO TABS: 80.0000 mg | ORAL_TABLET | Freq: Two times a day (BID) | ORAL | 5 refills | Status: DC

## 2017-05-21 MED ORDER — VITAMIN D (ERGOCALCIFEROL) 1.25 MG (50000 UNIT) PO CAPS: 50000.0000 [IU] | ORAL_CAPSULE | ORAL | 0 refills | Status: DC

## 2017-05-21 MED ORDER — HYDROXYZINE PAMOATE 25 MG PO CAPS: 25.0000 mg | ORAL_CAPSULE | Freq: Every day | ORAL | 0 refills | Status: DC

## 2017-05-21 MED ORDER — CARVEDILOL 25 MG PO TABS: 25.0000 mg | ORAL_TABLET | Freq: Two times a day (BID) | ORAL | 1 refills | Status: DC

## 2017-05-21 MED ORDER — ASPIRIN 81 MG PO TBEC: 81.0000 mg | DELAYED_RELEASE_TABLET | Freq: Every day | ORAL | 2 refills | Status: DC

## 2017-05-21 MED ORDER — SPIRONOLACTONE 25 MG PO TABS: 12.5000 mg | ORAL_TABLET | Freq: Every day | ORAL | 3 refills | Status: DC

## 2017-05-21 MED ORDER — TRAMADOL HCL 50 MG PO TABS: 50.0000 mg | ORAL_TABLET | Freq: Two times a day (BID) | ORAL | 2 refills | Status: DC

## 2017-05-21 NOTE — Assessment & Plan Note (Addendum)
BP today is 134/87, at goal on regimen of Coreg 25 BID, Spironolactone 12.5 mg, and Entresto 24-26   --Continue current regimen. Medication refills provided

## 2017-05-21 NOTE — Patient Instructions (Addendum)
It was very nice to meet you Robin Hardin  Keep using the Victoza with the samples we provided, and Dr. Maudie Mercury will see if the similar medicine would be cheaper through a program. At your next cardiology appointment may be worth discussing if they think you would do well with a bariatric surgery for weight loss.  For your pain, we will try tramadol. Take 50 mg 2 times a day. Also, continue working with physical therapy and doing the exercises they suggest.  We refilled your medicines today also. Let us know if any sent to a different pharmacy

## 2017-05-21 NOTE — Assessment & Plan Note (Addendum)
Today her weight is stable compared to May '18 (311 vs 312 lbs), with fluctuation in the interval. She was previously started on Liraglutide which has been approved for weight loss. She initially had difficulty obtaining this medication due to cost, but has been able to use it for ~2 months. In discussing options with Dr. Maudie Mercury, another GLP-1 option may be covered on a patient assistance program. The pt appears to have been denied by Medicare extra help and would not be eligible for other resources given she is insured. Other medications approved for weight loss are also prohibitively expensive. She is hesitant toward bariatric surgery and if she could tolerate such a procedure, and also knows a relative who suffered complications from surgery. She was encouraged to discuss the risks of surgery with her cardiologist and to consider if she feels bariatric surgery may help meet her weight loss goals. She appears to be a highly motivated pt that has made dietary changes and maintained them over time. Exercise is limited by chronic pain.   She was provided with additional Victoza samples. Appreciate pharmacy assistance in exploring resources for affordable medication options to assist with weight loss

## 2017-05-21 NOTE — Assessment & Plan Note (Signed)
Recently identified by psychiatry on routine testing, Vit D 8.6. Has been started on supplementation.   --Continue Vit D supplementation

## 2017-05-21 NOTE — Assessment & Plan Note (Addendum)
At an [facility] visit on 8/17 the pt was noted to have been off of her pain medications for several weeks and had been functioning well and likely did not require opiate pain medication. After long discussion, the pt agreed with a change in therapy and has been managing her pain with Tylenol and physical therapy in the interval. She reports inadequate relief with Tylenol. NSAID options limited due to her CKD. She inquired about returning to stronger pain medications, however, understood the risks and lack of long term benefit. Will start Tramadol at 50 mg BID which will hopefully provide benefit and avoid higher strength opiates she was previously on. Continued weight loss will likely also have a large positive effect on her pain.   --Tramadol 50 mg BID --Encourage continue PT exercises

## 2017-05-21 NOTE — Assessment & Plan Note (Signed)
Recently switched depression regimen to Cymbalta by psychiatry, tolerating well and will follow up with psychiatry for more detailed evaluation of response

## 2017-05-21 NOTE — Progress Notes (Signed)
CC: Follow up of chronic medical conditions   HPI:  [name] is a 66 y.o. female with past medical history as described below who presents to the clinic for follow-up of her chronic medical conditions.  HTN: Reports adherence to regimen and no sx of light-headedness dizziness.   CHF: Adherence to cardiac regimen and denies edema. Does not DOE after one flight of stairs and small amounts of activity.   OSA: Has not been using CPAP machine though she reports tolerating it in the past.   Obesity: Her weight was her main concern today. She recently started Victoza for weight loss, though this has been difficult to obtain due to cost and has not noticed a significant difference. She has made good long term progress but states she hopes to be around 290 lbs.   Chronic Pain: Long term opiates discontinued 8/16 after pt went several weeks without pain medications and maintained good functioning. Has been trying Tylenol 1000 mg TID prn since that time which she reports does not provide benefit. She has been attending physical therapy and pleased with results.   Depression: Follows with psychiatry, Dr. Daron Hardin, recently started Cymbalta for her depression.   Past Medical History:  Diagnosis Date  . Acute respiratory failure with hypoxia ([place]) 09/26/2012  . Bilateral knee pain   . Cellulitis of lower leg    left  . Chronic combined systolic and diastolic congestive heart failure ([place]) 10/10/2012   2 D echo 09/2012:  Left ventricle: Extremely poor acoustic windows limit study. Overall LVEF appears to be severely depressed. Recomm ordering limited study with contrast to further evaluate LV systolic function. The cavity size was normal. Wall thickness was normal. Doppler parameters are consistent with abnormal left ventricular relaxation (grade 1 diastolic dysfunction).   2 D echo 03/2009 :  Left ventricle: The cavity size was normal. There was mild concentric hypertrophy. Systolic function was  mildly reduced. The estimated ejection fraction was in the range of 45% to 50%. Diffuse hypokinesis. Doppler parameters are consistent with abnormal left ventricular relaxation (grade 1 diastolic dysfunction).     . Chronic kidney disease 10/10/2012   Stage 2 with GFR of 64   . Depression   . Diabetes mellitus   . Gout   . H/O: hysterectomy   . Hyperlipidemia   . Hypertension   . Malignant hypertension 09/26/2012  . Morbid obesity ([place])   . Nonischemic cardiomyopathy ([place])   . Sleep apnea    on cpap  . Unspecified essential hypertension 03/22/2009   Qualifier: Diagnosis of  By: Karrie Meres, RN, BSN, [name]     Review of Systems:  Review of Systems  Constitutional: Negative for fever.  Cardiovascular: Negative for leg swelling.  Musculoskeletal: Positive for back pain and joint pain.  Neurological: Negative for dizziness.     Physical Exam:  Vitals:   05/21/17 1341  BP: 134/87  Pulse: 82  Temp: 98.4 F (36.9 C)  TempSrc: Oral  SpO2: 98%  Weight: (!) 311 lb 6.4 oz (141.3 kg)   Physical Exam  Constitutional: She is oriented to person, place, and time.  Obese female sitting in char, no acute distress   HENT:  Head: Normocephalic and atraumatic.  Cardiovascular: Normal rate, regular rhythm and normal heart sounds.   Pulmonary/Chest: Effort normal and breath sounds normal. No respiratory distress.  Musculoskeletal: She exhibits no edema.  Neurological: She is alert and oriented to person, place, and time.    Assessment & Plan:   See Encounters  Tab for problem based charting.  Patient seen with Dr. Angelia Mould

## 2017-05-22 NOTE — Progress Notes (Signed)
Internal Medicine Clinic Attending  I saw and evaluated the patient.  I personally confirmed the key portions of the history and exam documented by Dr. Johny Chess and I reviewed pertinent patient test results.  The assessment, diagnosis, and plan were formulated together and I agree with the documentation in the resident's note.

## 2017-05-23 ENCOUNTER — Ambulatory Visit: Payer: Medicare HMO | Admitting: Physical Therapy

## 2017-05-23 DIAGNOSIS — M62830 Muscle spasm of back: Secondary | ICD-10-CM

## 2017-05-23 DIAGNOSIS — M545 Low back pain: Secondary | ICD-10-CM | POA: Diagnosis not present

## 2017-05-23 DIAGNOSIS — R293 Abnormal posture: Secondary | ICD-10-CM

## 2017-05-23 DIAGNOSIS — M25561 Pain in right knee: Secondary | ICD-10-CM | POA: Diagnosis not present

## 2017-05-23 DIAGNOSIS — G8929 Other chronic pain: Secondary | ICD-10-CM

## 2017-05-23 DIAGNOSIS — M25562 Pain in left knee: Secondary | ICD-10-CM

## 2017-05-23 DIAGNOSIS — M6281 Muscle weakness (generalized): Secondary | ICD-10-CM | POA: Diagnosis not present

## 2017-05-23 NOTE — Therapy (Addendum)
[place], [place], [address] Phone: [phone]   Fax:  [phone]  Physical Therapy Treatment / Discharge summary  Patient Details  Name: Robin Hardin MRN: [id number] Date of Birth: Feb 03, 1951 Referring Provider: Bartholomew Crews, MD  Encounter Date: 05/23/2017      PT End of Session - 05/23/17 1105    Visit Number 8   Number of Visits 17   Date for PT Re-Evaluation 06/11/17   Authorization Type MCR: Kx mod by 15th visit, progress not by10th visit   PT Start Time 1027  12 minutes late    PT Stop Time 1100   PT Time Calculation (min) 33 min      Past Medical History:  Diagnosis Date  . Acute respiratory failure with hypoxia ([place]) 09/26/2012  . Bilateral knee pain   . Cellulitis of lower leg    left  . Chronic combined systolic and diastolic congestive heart failure ([place]) 10/10/2012   2 D echo 09/2012:  Left ventricle: Extremely poor acoustic windows limit study. Overall LVEF appears to be severely depressed. Recomm ordering limited study with contrast to further evaluate LV systolic function. The cavity size was normal. Wall thickness was normal. Doppler parameters are consistent with abnormal left ventricular relaxation (grade 1 diastolic dysfunction).   2 D echo 03/2009 :  Left ventricle: The cavity size was normal. There was mild concentric hypertrophy. Systolic function was mildly reduced. The estimated ejection fraction was in the range of 45% to 50%. Diffuse hypokinesis. Doppler parameters are consistent with abnormal left ventricular relaxation (grade 1 diastolic dysfunction).     . Chronic kidney disease 10/10/2012   Stage 2 with GFR of 64   . Depression   . Diabetes mellitus   . Gout   . H/O: hysterectomy   . Hyperlipidemia   . Hypertension   . Malignant hypertension 09/26/2012  . Morbid obesity ([place])   . Nonischemic cardiomyopathy ([place])   . Sleep apnea    on cpap  . Unspecified  essential hypertension 03/22/2009   Qualifier: Diagnosis of  By: Karrie Meres, RN, BSN, [name]      No past surgical history on file.  There were no vitals filed for this visit.      Subjective Assessment - 05/23/17 1054    Subjective Pain is not as severe. Still limited in how far I can stand and walk    How long can you stand comfortably? 5-7 minutes    How long can you walk comfortably? 5 min   Patient Stated Goals Help with standing, decrease pain, be able to cook and do activities around the house, help with husband    Currently in Pain? No/denies                         [facility] Adult PT Treatment/Exercise - 05/23/17 0001      Ambulation/Gait   Gait Comments ambulated 236 ft in clinic prior to needing rest break due to back pain.      Lumbar Exercises: Stretches   Pelvic Tilt Limitations 10 x 5 sec      Lumbar Exercises: Supine   Glut Set Limitations pelvic tilt with feet on bal x10    Clam 20 reps  green band    Clam Limitations with abdominal draw in, cues for breathing    Bent Knee Raise 20 reps   Bent Knee Raise Limitations with abdominal draw in    [place]  10 reps   Bridge Limitations 2 sets    Straight Leg Raise 10 reps   Straight Leg Raises Limitations 2 sets with ab set    Other Supine Lumbar Exercises physioball isometric abdominals x 10   also obliques x 20 alternating    Other Supine Lumbar Exercises physioball roll r/L- too much strain on adductors      Knee/Hip Exercises: Aerobic   Nustep L5 x 6 minutes                   PT Short Term Goals - 05/16/17 1105      PT SHORT TERM GOAL #1   Title pt to be I with inital HEP   Period Weeks   Status Achieved     PT SHORT TERM GOAL #2   Title pt to verbalize/ demo proper posture and lifting techniques to prevent and reduce low back pain   Time 4   Period Weeks   Status Partially Met     PT SHORT TERM GOAL #3   Title pt to be able to stand for >/= 8 min to demo improving  standing  tolerance with </= 6/10 pain in the low back    Time 4   Period Weeks   Status Partially Met           PT Long Term Goals - 04/16/17 1151      PT LONG TERM GOAL #1   Title pt to increase bil LE strength to >/= 4/5 to promote knee/ hip stability with walking/ standing activities   Time 8   Period Weeks   Status New   Target Date 06/11/17     PT LONG TERM GOAL #2   Title pt to increase trunk side bending by >/= 8 degrees and functional trunk mobility with </= 3/10 pain for functional mobility required for ADLs    Time 8   Period Weeks   Status New   Target Date 06/11/17     PT LONG TERM GOAL #3   Title she will be able to stand and walk for >/= 20 min and navigate >/= 15 steps with </= 4/10 pain for functional home/ community ambulation   Time 8   Period Weeks   Status New   Target Date 06/11/17     PT LONG TERM GOAL #4   Title increase FOTO to </= 49% limited to demo improvement in function    Time 8   Period Weeks   Status New   Target Date 06/11/17     PT LONG TERM GOAL #5   Title pt to be I with all HEP given as of last visit    Time 8   Period Weeks   Status New   Target Date 06/11/17               Plan - 05/23/17 1106    Clinical Impression Statement Pt arrives late today. Able to ambulate in clinic 236 ft prior to severe back pain requiring her to sit down. Worked on supine core strength today with increased repetitons tolerated well with cues for breathing.    PT Next Visit Plan FOTO NEXT interested in group program :PREFERS [place] : update HEP PRN, core strengthening, manual for low back, hip strengthening, hamstring stretching, hip / knee strengthening. endurance traning   PT Home Exercise Plan hamstring stretching, lower trunk rotation, low back stretch (in sitting), posterior pelvic tilt, SAQ with ball squeeze, sit  to stand, seated/ standing pelvic tilts, QL stretching   Consulted and Agree with Plan of Care Patient       Patient will benefit from skilled therapeutic intervention in order to improve the following deficits and impairments:  Abnormal gait, Pain, Improper body mechanics, Obesity, Postural dysfunction, Decreased range of motion, Decreased endurance, Decreased strength, Decreased activity tolerance, Decreased balance, Increased fascial restricitons, Increased muscle spasms, Decreased safety awareness, Decreased mobility  Visit Diagnosis: Muscle spasm of back  Chronic bilateral low back pain without sciatica  Chronic pain of left knee  Muscle weakness (generalized)  Abnormal posture  Chronic pain of right knee     Problem List Patient Active Problem List   Diagnosis Date Noted  . Vitamin D deficiency 05/21/2017  . Ingrown toenail 09/26/2016  . Normocytic anemia 12/28/2014  . History of diabetes mellitus 07/27/2014  . Health care maintenance 08/25/2013  . Depression 03/03/2013  . Long-term current use of opiate analgesic 03/03/2013  . Chronic combined systolic and diastolic congestive heart failure ([place]) 10/10/2012  . CKD (chronic kidney disease) stage 3, GFR 30-59 ml/min ([facility]) 10/10/2012  . Gout 10/09/2012  . Hyperlipidemia 05/31/2009  . Obstructive sleep apnea 04/07/2009  . Morbid obesity with BMI of 50.0-59.9, adult ([place]) 03/22/2009  . Essential hypertension 03/22/2009    Dorene Ar, PTA 05/23/2017, 11:10 AM  [facility] [address] [place], [place], [address] Phone: [phone]   Fax:  [phone]  Name: Robin Hardin MRN: [id number] Date of Birth: 03/05/1951      PHYSICAL THERAPY DISCHARGE SUMMARY  Visits from Start of Care: 8  Current functional level related to goals / functional outcomes: See goals   Remaining deficits: unknown   Education / Equipment: HEP, theraband, posture  Plan: Patient agrees to discharge.  Patient goals were partially met. Patient is being discharged due to  not returning since the last visit.  ?????      Kristoffer Leamon PT, DPT, LAT, ATC  07/01/17  8:49 AM

## 2017-06-05 ENCOUNTER — Telehealth: Payer: Self-pay | Admitting: *Deleted

## 2017-06-05 NOTE — Telephone Encounter (Signed)
Call to [facility] regarding need for PA for Tramadol HCL Tablets # 50. Transferred 4 times to solve issue about.  Patient picked up medication on October 1st .  Not due again until November 1st. All representatives were unsure as to why message for PA was sent .  Resent through CoverMYMeds. To do further investigation into problem if no response within 24 hours.  Sander Nephew, RN 06/05/2017 11:48 AM.

## 2017-07-04 ENCOUNTER — Ambulatory Visit (HOSPITAL_COMMUNITY): Payer: Self-pay | Admitting: Psychiatry

## 2017-07-09 ENCOUNTER — Telehealth (HOSPITAL_COMMUNITY): Payer: Self-pay

## 2017-07-09 NOTE — Telephone Encounter (Signed)
Appointment - Message received from patient stating she missed her appt. 07/04/17 as she could not find our location.  Stated she kept driving around but could not locate our office and wants to schedule another appt.

## 2017-07-09 NOTE — Telephone Encounter (Signed)
She has been seeing me for many months at this location.  She is welcome to return, I will do some cognitive testing with her at follow-up, lets keep her on the cancellation list and give her a call when there is an opening

## 2017-07-17 ENCOUNTER — Encounter (HOSPITAL_COMMUNITY): Payer: Self-pay | Admitting: Psychiatry

## 2017-07-17 ENCOUNTER — Ambulatory Visit (INDEPENDENT_AMBULATORY_CARE_PROVIDER_SITE_OTHER): Payer: Medicare HMO | Admitting: Psychiatry

## 2017-07-17 VITALS — BP 110/74 | HR 82 | Ht 63.0 in | Wt 314.0 lb

## 2017-07-17 DIAGNOSIS — Z79899 Other long term (current) drug therapy: Secondary | ICD-10-CM

## 2017-07-17 DIAGNOSIS — F332 Major depressive disorder, recurrent severe without psychotic features: Secondary | ICD-10-CM

## 2017-07-17 MED ORDER — DULOXETINE HCL 60 MG PO CPEP: 120.0000 mg | ORAL_CAPSULE | Freq: Every day | ORAL | 1 refills | Status: DC

## 2017-07-17 NOTE — Progress Notes (Signed)
[place] MD/PA/NP OP Progress Note  07/17/2017 1:48 PM Robin Hardin  MRN:  [id number]  Chief Complaint:  Chief Complaint    Follow-up     HPI: Robin Hardin reports that her husband had a heart attack, spent time grieving with the patient and hearing about some of the difficult decisions she has had to make.  It sounds like he is in recovery, but things are still quite fragile.  I spent time with her reviewing why she had missed the last visit, which was concerning lead due to her getting lost despite having been to this office multiple times.  We completed the [facility] cognitive assessment in office and she scored a 20 out of 30 (one-point added because she did not complete high school).  I expressed my concern about mild cognitive impairment possibly being contributed by depression.  She continues to struggle with severe depression, anhedonia, hopelessness, difficulty thinking, poor sleep, and subjective poor mood.   She has now been tried on Cymbalta, Wellbutrin, Remeron, Prozac, trazodone, Ambien.  I once again reviewed the prospects of [place] with the patient, and she continues to be agreeable to initiate [place] if approved and covered by insurance.  Visit Diagnosis:    ICD-10-CM   1. Severe episode of recurrent major depressive disorder, without psychotic features ([place]) F33.2     Past Psychiatric History: See intake H&P for full details. Reviewed, with no updates at this time.   Past Medical History:  Past Medical History:  Diagnosis Date  . Acute respiratory failure with hypoxia ([place]) 09/26/2012  . Bilateral knee pain   . Cellulitis of lower leg    left  . Chronic combined systolic and diastolic congestive heart failure ([place]) 10/10/2012   2 D echo 09/2012:  Left ventricle: Extremely poor acoustic windows limit study. Overall LVEF appears to be severely depressed. Recomm ordering limited study with contrast to further evaluate LV systolic function. The cavity size was normal. Wall  thickness was normal. Doppler parameters are consistent with abnormal left ventricular relaxation (grade 1 diastolic dysfunction).   2 D echo 03/2009 :  Left ventricle: The cavity size was normal. There was mild concentric hypertrophy. Systolic function was mildly reduced. The estimated ejection fraction was in the range of 45% to 50%. Diffuse hypokinesis. Doppler parameters are consistent with abnormal left ventricular relaxation (grade 1 diastolic dysfunction).     . Chronic kidney disease 10/10/2012   Stage 2 with GFR of 64   . Depression   . Diabetes mellitus   . Gout   . H/O: hysterectomy   . Hyperlipidemia   . Hypertension   . Malignant hypertension 09/26/2012  . Morbid obesity ([place])   . Nonischemic cardiomyopathy ([place])   . Sleep apnea    on cpap  . Unspecified essential hypertension 03/22/2009   Qualifier: Diagnosis of  By: Karrie Meres, RN, BSN, [name]     No past surgical history on file.  Family Psychiatric History: See intake H&P for full details. Reviewed, with no updates at this time.   Family History:  Family History  Problem Relation Age of Onset  . Asthma Mother   . Heart disease Mother   . Stroke Mother        clotting disorders  . Asthma Brother   . Asthma Brother     Social History:  Social History   Socioeconomic History  . Marital status: Married    Spouse name: None  . Number of children: 2  . Years of education: None  .  Highest education level: None  Social Needs  . Financial resource strain: None  . Food insecurity - worry: None  . Food insecurity - inability: None  . Transportation needs - medical: None  . Transportation needs - non-medical: None  Occupational History  . Occupation: retired Forensic psychologist: UNEMPLOYED  Tobacco Use  . Smoking status: Never Smoker  . Smokeless tobacco: Never Used  Substance and Sexual Activity  . Alcohol use: No    Alcohol/week: 0.0 oz  . Drug use: No  . Sexual activity: Not Currently    Partners: Male  Other  Topics Concern  . None  Social History Narrative  . None    Allergies: No Known Allergies  Metabolic Disorder Labs: Lab Results  Component Value Date   HGBA1C 5.5 11/08/2015   MPG 123 (H) 09/26/2012   MPG 148 (H) 05/26/2010   No results found for: PROLACTIN Lab Results  Component Value Date   CHOL 196 06/11/2016   TRIG 81 06/11/2016   HDL 58 06/11/2016   CHOLHDL 3.4 06/11/2016   VLDL 16 06/11/2016   LDLCALC 122 (H) 06/11/2016   LDLCALC 221 (H) 01/11/2016   Lab Results  Component Value Date   TSH 1.030 03/20/2017   TSH 1.295 12/28/2014    Therapeutic Level Labs: No results found for: LITHIUM No results found for: VALPROATE No components found for:  CBMZ  Current Medications: Current Outpatient Medications  Medication Sig Dispense Refill  . allopurinol (ZYLOPRIM) 300 MG tablet Take 1 tablet (300 mg total) by mouth daily. 90 tablet 1  . ALOE PO Take 1 capsule by mouth 2 (two) times daily.    Marland Kitchen aspirin 81 MG EC tablet Take 1 tablet (81 mg total) by mouth daily. 30 tablet 2  . carvedilol (COREG) 25 MG tablet Take 1 tablet (25 mg total) by mouth 2 (two) times daily with a meal. 180 tablet 1  . DULoxetine (CYMBALTA) 60 MG capsule Take 2 capsules (120 mg total) by mouth daily. 180 capsule 1  . ferrous sulfate 325 (65 FE) MG EC tablet Take 325 mg by mouth daily with breakfast.    . furosemide (LASIX) 80 MG tablet Take 1 tablet (80 mg total) by mouth 2 (two) times daily. 60 tablet 5  . liraglutide 18 MG/3ML SOPN Inject 0.3 mLs (1.8 mg total) into the skin daily. 3 pen 2  . potassium chloride SA (K-DUR,KLOR-CON) 20 MEQ tablet     . rosuvastatin (CRESTOR) 20 MG tablet Take 1 tablet (20 mg total) by mouth daily. 30 tablet 11  . sacubitril-valsartan (ENTRESTO) 24-26 MG Take 1 tablet by mouth 2 (two) times daily. 60 tablet 3  . spironolactone (ALDACTONE) 25 MG tablet Take 0.5 tablets (12.5 mg total) by mouth daily. 90 tablet 3  . traMADol (ULTRAM) 50 MG tablet Take 1 tablet (50  mg total) by mouth 2 (two) times daily. 60 tablet 2  . Vitamin D, Ergocalciferol, (DRISDOL) 50000 units CAPS capsule Take 1 capsule (50,000 Units total) by mouth every 7 (seven) days. Take 1 capsule every 7 days, until the bottle is finished 8 capsule 0  . acetaminophen (TYLENOL) 500 MG tablet Take 2 tablets (1,000 mg total) by mouth every 8 (eight) hours as needed. (Patient not taking: Reported on 07/17/2017) 90 tablet 1   No current facility-administered medications for this visit.      Musculoskeletal: Strength & Muscle Tone: within normal limits Gait & Station: normal Patient leans: N/A  Psychiatric Specialty Exam: ROS  Blood pressure 110/74, pulse 82, height _0  (1.6 m), weight (!) 314 lb (142.4 kg).Body mass index is 55.62 kg/m.  General Appearance: Casual and Fairly Groomed  Eye Contact:  Fair  Speech:  Clear and Coherent  Volume:  Normal  Mood:  Depressed and Dysphoric  Affect:  Congruent, Constricted and Depressed  Thought Process:  Goal Directed and Descriptions of Associations: Intact  Orientation:  Full (Time, Place, and Person)  Thought Content: Logical   Suicidal Thoughts:  No  Homicidal Thoughts:  No  Memory:  Immediate;   Poor  Judgement:  Fair  Insight:  Fair  Psychomotor Activity:  Normal  Concentration:  Concentration: Fair  Recall:  [place] of Knowledge: Fair  Language: Good  Akathisia:  Negative  Handed:  Right  AIMS (if indicated): not done  Assets:  Communication Skills Desire for Improvement Financial Resources/Insurance Housing  ADL's:  Intact  Cognition: Impaired,  Mild  Sleep:  Good   Screenings: PHQ2-9     Office Visit from 05/21/2017 in [place] from 04/04/2017 in [place] from 12/25/2016 in [place] from 11/28/2016 in [facility] Office Visit from 10/09/2016 in [place]  PHQ-2 Total Score  _1 PHQ-9 Total Score  _2 Assessment and Plan: Robin Hardin continues to struggle with severe depressive symptoms.  She has now failed trials of Cymbalta, Prozac, Remeron, Wellbutrin.  She presents with ongoing severe depressive symptoms and her [place] cognitive assessment is indicative of cognitive impairment.  It is difficult to rule out pseudodementia process in the setting of ongoing severe depression, complicated by external stressors as above.  I would like to proceed with [place] and the patient is agreeable with the plan of care.  1. Severe episode of recurrent major depressive disorder, without psychotic features ([place])     Status of current problems: unchanged  Labs Ordered: No orders of the defined types were placed in this encounter.   Labs Reviewed: na  Collateral Obtained/Records Reviewed: na  Plan:  MOCA score 20/30 today Increase Cymbalta to 120 mg Discontinue Wellbutrin given lack of benefit Return to clinic in 12 weeks Schedule TMS mapping  I spent 25 minutes with the patient in direct face-to-face clinical care.  Greater than 50% of this time was spent in counseling and coordination of care with the patient.   Aundra Dubin, MD 07/17/2017, 1:48 PM

## 2017-08-23 ENCOUNTER — Telehealth (HOSPITAL_COMMUNITY): Payer: Self-pay | Admitting: Pharmacist

## 2017-08-23 NOTE — Telephone Encounter (Signed)
Second grant for [employer] approved so that [name] will have $1000 to use toward her [name] copay costs. Relayed info to [facility] who was able to process it for no charge. Called and left VM for patient.   Patient Name - Robin Hardin DOB - 02-Nov-1950 Member ID - [id number] Disease Fund - Heart Failure 2nd grant amount - $1,000  Total remaining balance - $1,000  Eligibility End Date - 10/22/2017  Claims Submission End Date - 02/19/2018   Ruta Hinds. Velva Harman, PharmD, BCPS, CPP Clinical Pharmacist Pager: [phone] Phone: [phone] 08/23/2017 5:19 PM

## 2017-09-02 ENCOUNTER — Encounter: Payer: Self-pay | Admitting: Internal Medicine

## 2017-09-03 ENCOUNTER — Encounter: Payer: Self-pay | Admitting: Internal Medicine

## 2017-09-03 NOTE — Progress Notes (Deleted)
   CC: ***  HPI:  Ms.Shahd Mcculley is a 67 y.o.   Past Medical History:  Diagnosis Date  . Acute respiratory failure with hypoxia ([place]) 09/26/2012  . Bilateral knee pain   . Cellulitis of lower leg    left  . Chronic combined systolic and diastolic congestive heart failure ([place]) 10/10/2012   2 D echo 09/2012:  Left ventricle: Extremely poor acoustic windows limit study. Overall LVEF appears to be severely depressed. Recomm ordering limited study with contrast to further evaluate LV systolic function. The cavity size was normal. Wall thickness was normal. Doppler parameters are consistent with abnormal left ventricular relaxation (grade 1 diastolic dysfunction).   2 D echo 03/2009 :  Left ventricle: The cavity size was normal. There was mild concentric hypertrophy. Systolic function was mildly reduced. The estimated ejection fraction was in the range of 45% to 50%. Diffuse hypokinesis. Doppler parameters are consistent with abnormal left ventricular relaxation (grade 1 diastolic dysfunction).     . Chronic kidney disease 10/10/2012   Stage 2 with GFR of 64   . Depression   . Diabetes mellitus   . Gout   . H/O: hysterectomy   . Hyperlipidemia   . Hypertension   . Malignant hypertension 09/26/2012  . Morbid obesity ([place])   . Nonischemic cardiomyopathy ([place])   . Sleep apnea    on cpap  . Unspecified essential hypertension 03/22/2009   Qualifier: Diagnosis of  By: Karrie Meres RN, BSN, [name]     Review of Systems:  ***  Physical Exam:  There were no vitals filed for this visit. ***  Assessment & Plan:   See Encounters Tab for problem based charting.  Patient {GC/GE:[id number]::"discussed with","seen with"} Dr. {NAMES:[id number]::"Butcher","Granfortuna","E. Hoffman","Klima","Mullen","Narendra","Raines","Vincent"}

## 2017-09-04 ENCOUNTER — Other Ambulatory Visit: Payer: Self-pay | Admitting: Pharmacist

## 2017-09-04 DIAGNOSIS — Z6841 Body Mass Index (BMI) 40.0 and over, adult: Principal | ICD-10-CM

## 2017-09-04 MED ORDER — LIRAGLUTIDE 18 MG/3ML ~~LOC~~ SOPN: 1.8000 mg | PEN_INJECTOR | Freq: Every day | SUBCUTANEOUS | 2 refills | Status: DC

## 2017-09-04 NOTE — Progress Notes (Signed)
Patient requested re-trial of liraglutide prescription to see if new copay will be affordable under [employer]. Prescription sent.

## 2017-10-01 ENCOUNTER — Other Ambulatory Visit: Payer: Self-pay | Admitting: Internal Medicine

## 2017-10-01 NOTE — Telephone Encounter (Signed)
Next appt scheduled  2/26 with PCP.

## 2017-10-08 ENCOUNTER — Encounter (HOSPITAL_COMMUNITY): Payer: Self-pay | Admitting: Psychiatry

## 2017-10-08 ENCOUNTER — Encounter: Payer: Self-pay | Admitting: Internal Medicine

## 2017-10-08 ENCOUNTER — Ambulatory Visit (INDEPENDENT_AMBULATORY_CARE_PROVIDER_SITE_OTHER): Payer: Medicare HMO | Admitting: Psychiatry

## 2017-10-08 VITALS — BP 138/84 | HR 82 | Ht 63.0 in | Wt 317.8 lb

## 2017-10-08 DIAGNOSIS — F332 Major depressive disorder, recurrent severe without psychotic features: Secondary | ICD-10-CM | POA: Diagnosis not present

## 2017-10-08 DIAGNOSIS — F4329 Adjustment disorder with other symptoms: Secondary | ICD-10-CM | POA: Diagnosis not present

## 2017-10-08 DIAGNOSIS — F4381 Prolonged grief disorder: Secondary | ICD-10-CM

## 2017-10-08 DIAGNOSIS — Z79899 Other long term (current) drug therapy: Secondary | ICD-10-CM

## 2017-10-08 MED ORDER — BREXPIPRAZOLE 0.5 MG PO TABS: 0.5000 mg | ORAL_TABLET | Freq: Every evening | ORAL | 2 refills | Status: DC

## 2017-10-08 MED ORDER — DULOXETINE HCL 60 MG PO CPEP: 120.0000 mg | ORAL_CAPSULE | Freq: Every day | ORAL | 1 refills | Status: DC

## 2017-10-08 NOTE — Progress Notes (Signed)
[facility] MD/PA/NP OP Progress Note  10/08/2017 2:04 PM Robin Hardin  MRN:  [id number]  Chief Complaint: med management  HPI: Robin Hardin reports continued depression, grief, hopelessness.  She continues to have crying spells, and feels that she is stuck.  We discussed the risks and benefits of Rexulti, which she has read about in the waiting room.  Reviewed the expected benefits and treatment for major depressive disorder as an augmenting agent.  We agreed to start low-dose 0.5 mg daily.  She continues to feel passive suicidal thoughts but denies any intention to harm herself.  I spent time with her processing some of the grief she experiencing from her husband's ongoing cardiac struggles, he has been out of the house for 2 months now the cardiac rehab.  She continues to grieve her daughter who is severely neurologically devastated from a motor vehicle accident 1 year ago.  She feels lonely in her home.  She continues to struggle with her own health issues as well.  She reports that she has good support from her son, and from her aunt, and spends much of her day talking with family members.  Visit Diagnosis:    ICD-10-CM   1. Severe episode of recurrent major depressive disorder, without psychotic features (HCC) F33.2 DULoxetine (CYMBALTA) 60 MG capsule    Brexpiprazole (REXULTI) 0.5 MG TABS  2. 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 Reviewed, with no updates at this time.   Past Medical History:  Past Medical History:  Diagnosis Date  . Acute respiratory failure with hypoxia ([place]) 09/26/2012  . Bilateral knee pain   . Cellulitis of lower leg    left  . Chronic combined systolic and diastolic congestive heart failure ([place]) 10/10/2012   2 D echo 09/2012:  Left ventricle: Extremely poor acoustic windows limit study. Overall LVEF appears to be severely depressed. Recomm ordering limited study with contrast to  further evaluate LV systolic function. The cavity size was normal. Wall thickness was normal. Doppler parameters are consistent with abnormal left ventricular relaxation (grade 1 diastolic dysfunction).   2 D echo 03/2009 :  Left ventricle: The cavity size was normal. There was mild concentric hypertrophy. Systolic function was mildly reduced. The estimated ejection fraction was in the range of 45% to 50%. Diffuse hypokinesis. Doppler parameters are consistent with abnormal left ventricular relaxation (grade 1 diastolic dysfunction).     . Chronic kidney disease 10/10/2012   Stage 2 with GFR of 64   . Depression   . Diabetes mellitus   . Gout   . H/O: hysterectomy   . Hyperlipidemia   . Hypertension   . Malignant hypertension 09/26/2012  . Morbid obesity ([place])   . Nonischemic cardiomyopathy ([place])   . Sleep apnea    on cpap  . Unspecified essential hypertension 03/22/2009   Qualifier: Diagnosis of  By: Karrie Meres RN, BSN, [name]     History reviewed. No pertinent surgical history.  Family Psychiatric History: See intake H&P for full details. Reviewed, with no updates at this time.   Family History:  Family History  Problem Relation Age of Onset  . Asthma Mother   . Heart disease Mother   . Stroke Mother        clotting disorders  . Asthma Brother   . Asthma Brother     Social History:  Social History   Socioeconomic History  . Marital status: Married  Spouse name: None  . Number of children: 2  . Years of education: None  . Highest education level: None  Social Needs  . Financial resource strain: None  . Food insecurity - worry: None  . Food insecurity - inability: None  . Transportation needs - medical: None  . Transportation needs - non-medical: None  Occupational History  . Occupation: retired Forensic psychologist: UNEMPLOYED  Tobacco Use  . Smoking status: Never Smoker  . Smokeless tobacco: Never Used  Substance and Sexual Activity  . Alcohol use: No    Alcohol/week: 0.0  oz  . Drug use: No  . Sexual activity: Not Currently    Partners: Male  Other Topics Concern  . None  Social History Narrative  . None    Allergies: No Known Allergies  Metabolic Disorder Labs: Lab Results  Component Value Date   HGBA1C 5.5 11/08/2015   MPG 123 (H) 09/26/2012   MPG 148 (H) 05/26/2010   No results found for: PROLACTIN Lab Results  Component Value Date   CHOL 196 06/11/2016   TRIG 81 06/11/2016   HDL 58 06/11/2016   CHOLHDL 3.4 06/11/2016   VLDL 16 06/11/2016   LDLCALC 122 (H) 06/11/2016   LDLCALC 221 (H) 01/11/2016   Lab Results  Component Value Date   TSH 1.030 03/20/2017   TSH 1.295 12/28/2014    Therapeutic Level Labs: No results found for: LITHIUM No results found for: VALPROATE No components found for:  CBMZ  Current Medications: Current Outpatient Medications  Medication Sig Dispense Refill  . acetaminophen (TYLENOL) 500 MG tablet Take 2 tablets (1,000 mg total) by mouth every 8 (eight) hours as needed. 90 tablet 1  . allopurinol (ZYLOPRIM) 300 MG tablet Take 1 tablet (300 mg total) by mouth daily. 90 tablet 1  . ALOE PO Take 1 capsule by mouth 2 (two) times daily.    Marland Kitchen aspirin 81 MG EC tablet Take 1 tablet (81 mg total) by mouth daily. 30 tablet 2  . carvedilol (COREG) 25 MG tablet Take 1 tablet (25 mg total) by mouth 2 (two) times daily with a meal. 180 tablet 1  . DULoxetine (CYMBALTA) 60 MG capsule Take 2 capsules (120 mg total) by mouth daily. 180 capsule 1  . ferrous sulfate 325 (65 FE) MG EC tablet Take 325 mg by mouth daily with breakfast.    . furosemide (LASIX) 80 MG tablet Take 1 tablet (80 mg total) by mouth 2 (two) times daily. 60 tablet 5  . liraglutide (VICTOZA) 18 MG/3ML SOPN Inject 0.3 mLs (1.8 mg total) into the skin daily. 3 pen 2  . potassium chloride SA (K-DUR,KLOR-CON) 20 MEQ tablet     . rosuvastatin (CRESTOR) 20 MG tablet Take 1 tablet (20 mg total) by mouth daily. 30 tablet 11  . sacubitril-valsartan (ENTRESTO)  24-26 MG Take 1 tablet by mouth 2 (two) times daily. 60 tablet 3  . spironolactone (ALDACTONE) 25 MG tablet Take 0.5 tablets (12.5 mg total) by mouth daily. 90 tablet 3  . traMADol (ULTRAM) 50 MG tablet TAKE 1 TABLET BY MOUTH TWICE A DAY 60 tablet 0  . Vitamin D, Ergocalciferol, (DRISDOL) 50000 units CAPS capsule Take 1 capsule (50,000 Units total) by mouth every 7 (seven) days. Take 1 capsule every 7 days, until the bottle is finished 8 capsule 0  . Brexpiprazole (REXULTI) 0.5 MG TABS Take 1 tablet (0.5 mg total) by mouth Nightly. 30 tablet 2   No current facility-administered medications for  this visit.      Musculoskeletal: Strength & Muscle Tone: abnormal Gait & Station: unsteady Patient leans: N/A  Psychiatric Specialty Exam: ROS  Blood pressure 138/84, pulse 82, height 5' 3" (1.6 m), weight (!) 317 lb 12.8 oz (144.2 kg).Body mass index is 56.3 kg/m.  General Appearance: Casual and Fairly Groomed  Eye Contact:  Fair  Speech:  Normal Rate  Volume:  Normal  Mood:  Depressed and Dysphoric  Affect:  Congruent and Depressed  Thought Process:  Goal Directed and Descriptions of Associations: Intact  Orientation:  Full (Time, Place, and Person)  Thought Content: Logical and Rumination   Suicidal Thoughts:  Yes.  without intent/plan  Homicidal Thoughts:  No  Memory:  Immediate;   Fair  Judgement:  Fair  Insight:  Fair  Psychomotor Activity:  Normal  Concentration:  Concentration: Good  Recall:  [place] of Knowledge: Good  Language: Good  Akathisia:  Negative  Handed:  Right  AIMS (if indicated): not done  Assets:  Communication Skills Desire for Improvement Financial Resources/Insurance Housing Social Support Transportation  ADL's:  Intact  Cognition: WNL  Sleep:  Fair   Screenings: PHQ2-9     Office Visit from 05/21/2017 in [facility] Office Visit from 04/04/2017 in [facility] Office Visit from 12/25/2016 in [facility] Office Visit from 11/28/2016 in [facility] Office Visit from 10/09/2016 in [facility]  PHQ-2 Total Score  _0 PHQ-9 Total Score  _1 Assessment and Plan:  Bernell Haynie continues to struggle with severe depressive symptoms, complicated by multiple family tragedies occurring over the years, most recently her daughter suffering severe neurological devastation from a motor vehicle accident 1 year ago, and her husband being in a cardiac rehab facility for the past 2 months.  She feels frustrated and depressed, and presents with ongoing issues of irritability, low motivation, difficulty with sleeping, decreased energy, stress eating, difficulty with concentration, and passive thoughts that being dead would be easier.  She denies any intentions to harm herself.  I continue to consider [place] with her, and are pursuing preauthorization to see if this would be financially feasible for her.  We continue Cymbalta and will augment with Rexulti as below.  1. Severe episode of recurrent major depressive disorder, without psychotic features ([place])   2. 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   Labs Reviewed: n/a  Collateral Obtained/Records Reviewed: n/a  Plan:  We will continue Cymbalta 120 mg daily Initiate Rexulti 0.5 mg daily Return to clinic in 10-12 weeks Continue to encourage individual therapy and grief therapy  I spent 25 minutes with the patient in direct face-to-face clinical care.  Greater than 50% of this time was spent in counseling and coordination of care with the patient.    Aundra Dubin, MD 10/08/2017, 2:04 PM

## 2017-10-10 ENCOUNTER — Telehealth (HOSPITAL_COMMUNITY): Payer: Self-pay

## 2017-10-10 NOTE — Telephone Encounter (Signed)
Medication management - Prior authorization for patient's Rexulti completed and submitted online with CoverMyMeds for [facility].

## 2017-10-11 NOTE — Telephone Encounter (Signed)
Thank you!

## 2017-10-12 NOTE — Telephone Encounter (Signed)
Medication managment - Fax received from [facility] to inform of patient's approval for Rexulti 0.5 mg, good until 10/11/2019. Called and left a voice message at [facility] that the medication was approved and should be able to be filled now.

## 2017-10-15 ENCOUNTER — Encounter: Payer: Self-pay | Admitting: Internal Medicine

## 2017-10-15 ENCOUNTER — Other Ambulatory Visit: Payer: Self-pay

## 2017-10-15 ENCOUNTER — Ambulatory Visit (INDEPENDENT_AMBULATORY_CARE_PROVIDER_SITE_OTHER): Payer: Medicare HMO | Admitting: Internal Medicine

## 2017-10-15 VITALS — BP 126/80 | HR 75 | Temp 98.8°F | Wt 324.4 lb

## 2017-10-15 DIAGNOSIS — M25562 Pain in left knee: Secondary | ICD-10-CM | POA: Diagnosis not present

## 2017-10-15 DIAGNOSIS — I5042 Chronic combined systolic (congestive) and diastolic (congestive) heart failure: Secondary | ICD-10-CM | POA: Diagnosis not present

## 2017-10-15 DIAGNOSIS — Z23 Encounter for immunization: Secondary | ICD-10-CM

## 2017-10-15 DIAGNOSIS — G8929 Other chronic pain: Secondary | ICD-10-CM | POA: Diagnosis not present

## 2017-10-15 DIAGNOSIS — Z6841 Body Mass Index (BMI) 40.0 and over, adult: Secondary | ICD-10-CM

## 2017-10-15 DIAGNOSIS — M549 Dorsalgia, unspecified: Secondary | ICD-10-CM

## 2017-10-15 DIAGNOSIS — G4733 Obstructive sleep apnea (adult) (pediatric): Secondary | ICD-10-CM

## 2017-10-15 DIAGNOSIS — Z736 Limitation of activities due to disability: Secondary | ICD-10-CM

## 2017-10-15 DIAGNOSIS — I10 Essential (primary) hypertension: Secondary | ICD-10-CM

## 2017-10-15 DIAGNOSIS — Z79899 Other long term (current) drug therapy: Secondary | ICD-10-CM

## 2017-10-15 DIAGNOSIS — I110 Hypertensive heart disease with heart failure: Secondary | ICD-10-CM | POA: Diagnosis not present

## 2017-10-15 DIAGNOSIS — M25561 Pain in right knee: Secondary | ICD-10-CM

## 2017-10-15 DIAGNOSIS — Z0000 Encounter for general adult medical examination without abnormal findings: Secondary | ICD-10-CM

## 2017-10-15 NOTE — Progress Notes (Signed)
CC: F/u obesity, HTN  HPI:  Robin Hardin is a 67 y.o. F with a past medical history as described below who presents to the clinic for follow up of her obesity and chronic medical conditions.   Obesity: She is currently on Victoza as a weight loss medication. She has been trying to eat more salads and fruits while limiting rice and eating grilled meats instead of fried. She feels the victoza is currently helping her keep her weight stable or avoid excess weight gain rather helping her lose weight recently. She also inquires about a power wheelchair as her mobility is limited d/t obesity and her other conditions. This process has been started in the past but she felt the copay was too expensive.   OSA: She has a history of sleep apnea and has a CPAP machine at home but is not currently using it.   Chronic Pain: She has chronic joint pain in her back and knees. She feels her pain worsens with rainy weather and has been worse recently but is relieved with tramadol which she still has.   HF: Reports DOE with steps which is stable. She also tries to watch her sodium intake. She doesn't take her morning diuretic dose if she is leaving the house but states this does not typically lead to edema. Otherwise reports adherence.    Past Medical History:  Diagnosis Date  . Acute respiratory failure with hypoxia ([place]) 09/26/2012  . Bilateral knee pain   . Cellulitis of lower leg    left  . Chronic combined systolic and diastolic congestive heart failure ([place]) 10/10/2012   2 D echo 09/2012:  Left ventricle: Extremely poor acoustic windows limit study. Overall LVEF appears to be severely depressed. Recomm ordering limited study with contrast to further evaluate LV systolic function. The cavity size was normal. Wall thickness was normal. Doppler parameters are consistent with abnormal left ventricular relaxation (grade 1 diastolic dysfunction).   2 D echo 03/2009 :  Left ventricle: The cavity size was  normal. There was mild concentric hypertrophy. Systolic function was mildly reduced. The estimated ejection fraction was in the range of 45% to 50%. Diffuse hypokinesis. Doppler parameters are consistent with abnormal left ventricular relaxation (grade 1 diastolic dysfunction).     . Chronic kidney disease 10/10/2012   Stage 2 with GFR of 64   . Depression   . Gout   . H/O: hysterectomy   . Hyperlipidemia   . Hypertension   . Malignant hypertension 09/26/2012  . Morbid obesity ([place])   . Nonischemic cardiomyopathy ([place])   . Sleep apnea    on cpap  . Unspecified essential hypertension 03/22/2009   Qualifier: Diagnosis of  By: Karrie Meres, RN, BSN, [name]     Review of Systems:  Review of Systems  Constitutional: Negative for malaise/fatigue.  Cardiovascular: Negative for leg swelling.  Musculoskeletal: Positive for joint pain. Negative for falls.  Neurological: Negative for weakness.     Physical Exam:  Vitals:   10/15/17 1351  BP: 126/80  Pulse: 75  Temp: 98.8 F (37.1 C)  TempSrc: Oral  SpO2: 98%  Weight: (!) 324 lb 6.4 oz (147.1 kg)   General: Sitting in chair comfortably, no acute distress HEENT: Moist mucus membranes, no pharyngeal exudate CV: RRR, no murmur appreciated Resp: Clear breath sounds bilaterally, normal work of breathing, no distress  Abd: Soft, +BS, obese Extr: No LE edema  Neuro: Alert and oriented x3 Skin: Warm, dry      Assessment &  Plan:   See Encounters Tab for problem based charting.  Patient discussed with Dr. Angelia Mould

## 2017-10-15 NOTE — Patient Instructions (Addendum)
Good to see you today Robin Hardin.   We are going to increase the dose of your victoza to try to help with your weight loss a little more effectively. Use 2.4 mg daily for 1 week and then go up to 3 mg daily thereafter. I'll try to touch base with Dr. Maudie Mercury to see if this would cause an issue with getting the medicine. Continue to keep trying to eat healthy with more vegetables, smaller portions.   Using your CPAP may also help with weight loss. Try to use your machine each night and let us know if it is not working well.   For the mobility scooter, Ms. Chilon will check in on this process, but the required visit and orders have already been placed so hopefully you do not have to restart with this process.   You can call the clinic if you need any refills once you can check your medication bottles.   We gave some stool cards today to get you up to date.   I'll see you back in three or four months, but don't hesitate to call the clinic if you run into any problems sooner.

## 2017-10-16 ENCOUNTER — Telehealth (HOSPITAL_COMMUNITY): Payer: Self-pay

## 2017-10-16 NOTE — Assessment & Plan Note (Addendum)
Provided stool cards and administered pna vaccine (23) today. Mammogram has been ordered but not scheduled.

## 2017-10-16 NOTE — Assessment & Plan Note (Signed)
She has a history of combined HF currently on Lasix 80 BID, as well as coreg, spiro, and entresto. She reports stable DOE mainly with going up steps. Otherwise denies edema or orthopnea sx (states she sleeps on her side, doesn't lead to SOB). No edema today and does not appear fluid overloaded.   --Cont current regimen, encourage Na limitation, regular adherence to medication regimen

## 2017-10-16 NOTE — Telephone Encounter (Signed)
Medication management - Telephone call with Latanya Presser, pharmacist at [facility] to inform pt's Rexulti was approved; however, collatera reported the medication would be $461.62 a month. Agreed to inform provider.

## 2017-10-16 NOTE — Assessment & Plan Note (Signed)
BP 126/80 today, again at goal on current regimen of Coreg 25 mg BID, Spiro 12.5 mg, and Entresto. She does not think she is on BP medications as she identifies these as heart failure sx.   --Cont current regimen

## 2017-10-16 NOTE — Assessment & Plan Note (Signed)
Her weight has increased today compared to most recent visits and is up to 324 (previously around 310). She is currently using liraglutide as a weight loss aid, however she is only taking the 1.8 mg dose while 3.0 mg daily is recommended for weight loss under a different brand name. She currently receives liraglutide from assistance and after her last visit, Dr. Maudie Mercury worked to try to have assistance obtained for the 3.0 mg branding of liraglutide, however her initial request and appeal was denied. The pt was initially instructed to titrate up her Victoza dosing, however it appears the dose on the pens will not exceed 1.8 mg. We again discussed options with Dr. Maudie Mercury if it would be safe or efficacious to double the victoza dose with the current pen in order to reach a more effective weight loss dose or other options available. Will notify the patient of potential options following this investigation. Weight loss surgery may need to be discussed again as the financial burden of medications seems to be a limiting factor. Continued to encourage dietary modifications and sustained behavior changes.

## 2017-10-16 NOTE — Assessment & Plan Note (Signed)
She has a CPAP machine at home but is not currently using it. She was counseled that adequately treating OSA may help weight loss (which is her main concern) be more successful and sustained. She was unaware of this relationship and appears more motivated to use her CPAP. While her CPAP is older, she was encouraged to try to use it on its recommended settings. She will call the clinic if the machine is not functioning properly, as she will likely need a repeat sleep and titration study if needed.

## 2017-10-17 MED ORDER — ZALEPLON 10 MG PO CAPS: 10.0000 mg | ORAL_CAPSULE | Freq: Every day | ORAL | 2 refills | Status: DC

## 2017-10-17 NOTE — Telephone Encounter (Signed)
Yes definitely, we can offer her sonata 10 mg nightly instead

## 2017-10-17 NOTE — Telephone Encounter (Signed)
I called in the [place] and let the patient know it was at the pharmacy

## 2017-10-17 NOTE — Progress Notes (Signed)
Internal Medicine Clinic Attending  Case discussed with Dr. Harden at the time of the visit.  We reviewed the resident's history and exam and pertinent patient test results.  I agree with the assessment, diagnosis, and plan of care documented in the resident's note. 

## 2017-10-19 ENCOUNTER — Other Ambulatory Visit (HOSPITAL_COMMUNITY): Payer: Self-pay

## 2017-10-19 ENCOUNTER — Other Ambulatory Visit (HOSPITAL_COMMUNITY): Payer: Self-pay | Admitting: Psychiatry

## 2017-10-19 MED ORDER — SUVOREXANT 10 MG PO TABS: 10.0000 mg | ORAL_TABLET | Freq: Every evening | ORAL | 2 refills | Status: DC | PRN

## 2017-10-19 NOTE — Progress Notes (Signed)
Sent belsomra, sonata was not covered. rx sent to [facility] in pharmacy

## 2017-10-22 ENCOUNTER — Ambulatory Visit: Payer: Self-pay | Admitting: Pharmacist

## 2017-10-24 ENCOUNTER — Telehealth (HOSPITAL_COMMUNITY): Payer: Self-pay

## 2017-10-24 DIAGNOSIS — F332 Major depressive disorder, recurrent severe without psychotic features: Secondary | ICD-10-CM

## 2017-10-24 MED ORDER — SUVOREXANT 10 MG PO TABS: 10.0000 mg | ORAL_TABLET | Freq: Every evening | ORAL | 2 refills | Status: DC | PRN

## 2017-10-24 NOTE — Telephone Encounter (Signed)
Medication management - Met with Dr. Daron Offer to confirm where patient should be getting Belsomra after receiving a request from [facility]. Pharmacy this date. [facility] back to cancel order sent 10/19/17 per Dr. Daron Offer. Called in the order then to [facility] per verbal authorization by Dr. Daron Offer. [facility] and left a message with the new Belsomra prescription 10 mg, one at bedtime as needed, #30 with 2 refills per Dr. Daron Offer and then called and left patient another message this medication had been called in for her to [facility]. Prescription printed by mistake so called in voided out.

## 2017-11-04 ENCOUNTER — Other Ambulatory Visit: Payer: Self-pay | Admitting: Internal Medicine

## 2017-11-15 ENCOUNTER — Telehealth: Payer: Self-pay

## 2017-11-15 NOTE — Telephone Encounter (Signed)
Pt states she needs to speak with a nurse about getting a sample of the liraglutide (VICTOZA) 18 MG/3ML SOPN. Please call pt back.

## 2017-11-20 NOTE — Telephone Encounter (Signed)
Hi Lauren! I can help with Victoza samples, please schedule patient with me.  Thank you!

## 2017-11-24 ENCOUNTER — Other Ambulatory Visit: Payer: Self-pay | Admitting: Internal Medicine

## 2017-11-25 NOTE — Telephone Encounter (Signed)
Patient scheduled in Pharm clinic tomorrow at 1130 for Victoza samples. Hubbard Hartshorn, RN, BSN

## 2017-11-26 ENCOUNTER — Ambulatory Visit: Payer: Self-pay | Admitting: Pharmacist

## 2017-12-17 ENCOUNTER — Encounter: Payer: Self-pay | Admitting: Internal Medicine

## 2017-12-17 ENCOUNTER — Encounter (HOSPITAL_COMMUNITY): Payer: Self-pay | Admitting: Psychiatry

## 2017-12-18 ENCOUNTER — Other Ambulatory Visit: Payer: Self-pay | Admitting: Internal Medicine

## 2017-12-18 ENCOUNTER — Encounter: Payer: Self-pay | Admitting: Internal Medicine

## 2017-12-18 MED ORDER — CARVEDILOL 25 MG PO TABS: 25.0000 mg | ORAL_TABLET | Freq: Two times a day (BID) | ORAL | 3 refills | Status: DC

## 2017-12-20 ENCOUNTER — Encounter: Payer: Self-pay | Admitting: *Deleted

## 2017-12-24 ENCOUNTER — Ambulatory Visit: Payer: Self-pay | Admitting: Pharmacist

## 2017-12-26 ENCOUNTER — Telehealth: Payer: Self-pay | Admitting: Internal Medicine

## 2017-12-27 NOTE — Telephone Encounter (Signed)
F/U  Rec'd phone call back from [facility].  Patient already aware she was approved for her [place] and but, she could not afford the Co-pay to have it delivered.  Andria Rhein @ [facility] also offered her to go through a 3rd Party to help with the cost of the [facility] called [employer] and the patient refused those services.  The Patient will have to go through the same process all over again if she wishes to have a [place].

## 2018-01-06 ENCOUNTER — Ambulatory Visit (INDEPENDENT_AMBULATORY_CARE_PROVIDER_SITE_OTHER): Payer: Medicare HMO | Admitting: Psychiatry

## 2018-01-06 ENCOUNTER — Encounter (HOSPITAL_COMMUNITY): Payer: Self-pay | Admitting: Psychiatry

## 2018-01-06 VITALS — BP 115/85 | HR 69 | Ht 63.0 in | Wt 320.0 lb

## 2018-01-06 DIAGNOSIS — Z560 Unemployment, unspecified: Secondary | ICD-10-CM

## 2018-01-06 DIAGNOSIS — Z636 Dependent relative needing care at home: Secondary | ICD-10-CM

## 2018-01-06 DIAGNOSIS — R52 Pain, unspecified: Secondary | ICD-10-CM

## 2018-01-06 DIAGNOSIS — Z6332 Other absence of family member: Secondary | ICD-10-CM | POA: Diagnosis not present

## 2018-01-06 DIAGNOSIS — R4189 Other symptoms and signs involving cognitive functions and awareness: Secondary | ICD-10-CM

## 2018-01-06 DIAGNOSIS — F332 Major depressive disorder, recurrent severe without psychotic features: Secondary | ICD-10-CM

## 2018-01-06 MED ORDER — BREXPIPRAZOLE 1 MG PO TABS: 1.0000 mg | ORAL_TABLET | Freq: Every day | ORAL | 0 refills | Status: DC

## 2018-01-06 MED ORDER — DULOXETINE HCL 60 MG PO CPEP: 120.0000 mg | ORAL_CAPSULE | Freq: Every day | ORAL | 1 refills | Status: DC

## 2018-01-06 MED ORDER — SUVOREXANT 10 MG PO TABS: 10.0000 mg | ORAL_TABLET | Freq: Every evening | ORAL | 0 refills | Status: DC | PRN

## 2018-01-06 MED ORDER — ARIPIPRAZOLE 2 MG PO TABS: 2.0000 mg | ORAL_TABLET | Freq: Every day | ORAL | 0 refills | Status: DC

## 2018-01-06 NOTE — Progress Notes (Signed)
[place] MD/PA/NP OP Progress Note  01/06/2018 3:33 PM Ralynn San  MRN:  [id number]  Chief Complaint: got lost again HPI: Robin Hardin presents 30 minutes late for her appointment, reports that she got lost again.  Continues to complain of memory loss and depression.  She reports that she was feeling a little bit better, because her husband is come home, but that did not last.  She was not able to fill the Rexulti due to the high cost.  I suggested we initiate Abilify and reiterated the risks and benefits associated with atypical antipsychotic.  Disclosed to patient that Probation officer is leaving practice at the end of August and we will coordinate a transfer of care to Dr. Adele Schilder, and I would like to have her see him sooner for a geriatric psychiatry consultation given that this is his expertise.  She was receptive to this, expressed some grief about Probation officer leaving office.  Denies any acute safety issues.  Continues to have no contact with her daughter who moved out last year.  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 Severe episode of recurrent major depressive disorder, without psychotic features ([place]) F33.2 DULoxetine (CYMBALTA) 60 MG capsule    Suvorexant (BELSOMRA) 10 MG TABS    Past Psychiatric History: See intake H&P for full details. Reviewed, with no updates at this time.   Past Medical History:  Past Medical History:  Diagnosis Date  . Acute respiratory failure with hypoxia ([place]) 09/26/2012  . Bilateral knee pain   . Cellulitis of lower leg    left  . Chronic combined systolic and diastolic congestive heart failure ([place]) 10/10/2012   2 D echo 09/2012:  Left ventricle: Extremely poor acoustic windows limit study. Overall LVEF appears to be severely depressed. Recomm ordering limited study with contrast to further evaluate LV systolic function. The cavity size was normal. Wall thickness was normal. Doppler parameters are consistent with abnormal left ventricular  relaxation (grade 1 diastolic dysfunction).   2 D echo 03/2009 :  Left ventricle: The cavity size was normal. There was mild concentric hypertrophy. Systolic function was mildly reduced. The estimated ejection fraction was in the range of 45% to 50%. Diffuse hypokinesis. Doppler parameters are consistent with abnormal left ventricular relaxation (grade 1 diastolic dysfunction).     . Chronic kidney disease 10/10/2012   Stage 2 with GFR of 64   . Depression   . Gout   . H/O: hysterectomy   . Hyperlipidemia   . Hypertension   . Malignant hypertension 09/26/2012  . Morbid obesity ([place])   . Nonischemic cardiomyopathy ([place])   . Sleep apnea    on cpap  . Unspecified essential hypertension 03/22/2009   Qualifier: Diagnosis of  By: Karrie Meres RN, BSN, [name]     History reviewed. No pertinent surgical history.  Family Psychiatric History: See intake H&P for full details. Reviewed, with no updates at this time.   Family History:  Family History  Problem Relation Age of Onset  . Asthma Mother   . Heart disease Mother   . Stroke Mother        clotting disorders  . Asthma Brother   . Asthma Brother     Social History:  Social History   Socioeconomic History  . Marital status: Married    Spouse name: Not on file  . Number of children: 2  . Years of education: Not on file  . Highest education level: Not on file  Occupational History  .  Occupation: retired Forensic psychologist: UNEMPLOYED  Social Needs  . Financial resource strain: Not on file  . Food insecurity:    Worry: Not on file    Inability: Not on file  . Transportation needs:    Medical: Not on file    Non-medical: Not on file  Tobacco Use  . Smoking status: Never Smoker  . Smokeless tobacco: Never Used  Substance and Sexual Activity  . Alcohol use: No    Alcohol/week: 0.0 oz  . Drug use: No  . Sexual activity: Not Currently    Partners: Male  Lifestyle  . Physical activity:    Days per week: Not on file    Minutes per  session: Not on file  . Stress: Not on file  Relationships  . Social connections:    Talks on phone: Not on file    Gets together: Not on file    Attends religious service: Not on file    Active member of club or organization: Not on file    Attends meetings of clubs or organizations: Not on file    Relationship status: Not on file  Other Topics Concern  . Not on file  Social History Narrative  . Not on file    Allergies: No Known Allergies  Metabolic Disorder Labs: Lab Results  Component Value Date   HGBA1C 5.5 11/08/2015   MPG 123 (H) 09/26/2012   MPG 148 (H) 05/26/2010   No results found for: PROLACTIN Lab Results  Component Value Date   CHOL 196 06/11/2016   TRIG 81 06/11/2016   HDL 58 06/11/2016   CHOLHDL 3.4 06/11/2016   VLDL 16 06/11/2016   LDLCALC 122 (H) 06/11/2016   LDLCALC 221 (H) 01/11/2016   Lab Results  Component Value Date   TSH 1.030 03/20/2017   TSH 1.295 12/28/2014    Therapeutic Level Labs: No results found for: LITHIUM No results found for: VALPROATE No components found for:  CBMZ  Current Medications: Current Outpatient Medications  Medication Sig Dispense Refill  . acetaminophen (TYLENOL) 500 MG tablet Take 2 tablets (1,000 mg total) by mouth every 8 (eight) hours as needed. 90 tablet 1  . allopurinol (ZYLOPRIM) 300 MG tablet Take 1 tablet (300 mg total) by mouth daily. 90 tablet 1  . ALOE PO Take 1 capsule by mouth 2 (two) times daily.    Marland Kitchen aspirin 81 MG EC tablet Take 1 tablet (81 mg total) by mouth daily. 30 tablet 2  . carvedilol (COREG) 25 MG tablet Take 1 tablet (25 mg total) by mouth 2 (two) times daily with a meal. 180 tablet 3  . DULoxetine (CYMBALTA) 60 MG capsule Take 2 capsules (120 mg total) by mouth daily. 180 capsule 1  . ferrous sulfate 325 (65 FE) MG EC tablet Take 325 mg by mouth daily with breakfast.    . furosemide (LASIX) 80 MG tablet TAKE 1 TABLET TWICE DAILY 180 tablet 5  . liraglutide (VICTOZA) 18 MG/3ML SOPN  Inject 0.3 mLs (1.8 mg total) into the skin daily. 3 pen 2  . potassium chloride SA (K-DUR,KLOR-CON) 20 MEQ tablet     . rosuvastatin (CRESTOR) 20 MG tablet Take 1 tablet (20 mg total) by mouth daily. 30 tablet 11  . sacubitril-valsartan (ENTRESTO) 24-26 MG Take 1 tablet by mouth 2 (two) times daily. 60 tablet 3  . spironolactone (ALDACTONE) 25 MG tablet Take 0.5 tablets (12.5 mg total) by mouth daily. 90 tablet 3  . Suvorexant (BELSOMRA) 10  MG TABS Take 10 mg by mouth at bedtime as needed (for sleep). 90 tablet 0  . traMADol (ULTRAM) 50 MG tablet TAKE ONE TABLET BY MOUTH TWICE DAILY AS NEEDED 50 tablet 0  . Vitamin D, Ergocalciferol, (DRISDOL) 50000 units CAPS capsule Take 1 capsule (50,000 Units total) by mouth every 7 (seven) days. Take 1 capsule every 7 days, until the bottle is finished 8 capsule 0  . ARIPiprazole (ABILIFY) 2 MG tablet Take 1 tablet (2 mg total) by mouth daily. 90 tablet 0   No current facility-administered medications for this visit.      Musculoskeletal: Strength & Muscle Tone: within normal limits Gait & Station: normal Patient leans: slow walk, pain from back/joints  Psychiatric Specialty Exam: ROS  Blood pressure 115/85, pulse 69, height _0  (1.6 m), weight (!) 320 lb (145.2 kg).Body mass index is 56.69 kg/m.  General Appearance: Casual and Fairly Groomed  Eye Contact:  Fair  Speech:  Clear and Coherent and Normal Rate  Volume:  Normal  Mood:  Depressed  Affect:  Flat  Thought Process:  Goal Directed and Descriptions of Associations: Intact  Orientation:  Full (Time, Place, and Person)  Thought Content: Logical   Suicidal Thoughts:  No  Homicidal Thoughts:  No  Memory:  Immediate;   Poor  Judgement:  Fair  Insight:  Shallow  Psychomotor Activity:  Normal  Concentration:  Concentration: Poor  Recall:  Poor  Fund of Knowledge: Poor  Language: Fair  Akathisia:  Negative  Handed:  Left  AIMS (if indicated): not done  Assets:  Communication  Skills Desire for Improvement Housing  ADL's:  Intact  Cognition: Impaired,  Mild  Sleep:  Fair   Screenings: PHQ2-9     Office Visit from 10/15/2017 in [facility] Office Visit from 05/21/2017 in [facility] from 04/04/2017 in [facility] from 12/25/2016 in [facility] from 11/28/2016 in [facility]  PHQ-2 Total Score  0  _1 PHQ-9 Total Score  -  _2 Assessment and Plan:  Deneice Wack presents with ongoing complaints of depression, memory issues, complicated by significant pain, social stressors related to her husband's illness and her estrangement from her daughter.  I am quite suspicious of a pseudodementia process, complicated by personality factors.  I would like to have her consult with geriatric psychiatry for their take on her presentation.  I have offered Tiro, which the patient is agreeable to, but her approval for insurance has been incredibly cumbersome and denied multiple times.  We will proceed as below and follow up in 6 weeks.  1. Pseudodementia   2. Severe episode of recurrent major depressive disorder, without psychotic features ([place])     Status of current problems: unchanged  Labs Ordered: No orders of the defined types were placed in this encounter.   Labs Reviewed: na  Collateral Obtained/Records Reviewed: na  Plan:  Continue Belsomra 10 mg nightly Continue Cymbalta 60 mg twice a day Initiate Abilify 2 mg daily for augmentation of antidepressant Return to clinic in 6-8 weeks Consult with geriatric psych  I spent 15 minutes with the patient in direct face-to-face clinical care.  Greater than 50% of this time was spent in counseling and coordination of care with the patient.    Richard Miu  Daron Offer, MD 01/06/2018, 3:33 PM

## 2018-01-07 ENCOUNTER — Other Ambulatory Visit (HOSPITAL_COMMUNITY): Payer: Self-pay | Admitting: Psychiatry

## 2018-01-08 ENCOUNTER — Telehealth (HOSPITAL_COMMUNITY): Payer: Self-pay

## 2018-01-08 NOTE — Telephone Encounter (Signed)
Medication management - Telephone call with pt to discuss conversation with Dr. Adele Schilder and then Dr. Daron Offer to arrange to set patient up for a neurological assessment for memory loss instead of a geuro psych evaluation.  Dr. Daron Offer and Dr. Adele Schilder advised this might be the best course of action for her memory loss report and Dennie Maizes, CMA put in a referral to [facility] for a consult to address patient's concerns of memory loss.  Called patient to inform of changed plans and she agreed with new plan.  Informed appointment with Dr. Adele Schilder for 01/16/18 would be canceled and patient would need to keep one for Dr. Daron Offer on 02/18/18.  Informed someone from [facility] should be calling within the coming week to set up her first appointment and evaluation with them.  Requested patient call our office back if sh had not heard from them within the next week and patient agreed with all parts of plan change.  Patient to call our office back if any other issues or concerns prior to next appointment with Dr. Daron Offer 02/18/18.

## 2018-01-09 ENCOUNTER — Telehealth (HOSPITAL_COMMUNITY): Payer: Self-pay

## 2018-01-09 NOTE — Telephone Encounter (Signed)
Patient calling for entresto samples. 1 month supply left at front desk, patient aware.  Renee Pain, RN

## 2018-01-14 ENCOUNTER — Telehealth (HOSPITAL_COMMUNITY): Payer: Self-pay

## 2018-01-14 NOTE — Telephone Encounter (Signed)
Patient has yet to pick up entresto samples left for her on 5/23. Called patient to follow up and she says she has not felt well enough to drive out here to get them. States she is now out of medication.  I have advised her to come pick up medication and we will deliver it to her car or to have someone else pick up medication for her as she will start feeling worse without it. Patient aware and agreeable and will try to come pick up samples tomorrow.  Renee Pain, RN

## 2018-01-16 ENCOUNTER — Ambulatory Visit (HOSPITAL_COMMUNITY): Payer: Self-pay | Admitting: Psychiatry

## 2018-01-21 ENCOUNTER — Encounter: Payer: Self-pay | Admitting: Internal Medicine

## 2018-01-21 ENCOUNTER — Other Ambulatory Visit: Payer: Self-pay | Admitting: Internal Medicine

## 2018-01-28 ENCOUNTER — Encounter: Payer: Self-pay | Admitting: Pharmacist

## 2018-01-28 ENCOUNTER — Ambulatory Visit (INDEPENDENT_AMBULATORY_CARE_PROVIDER_SITE_OTHER): Payer: Medicare HMO | Admitting: Internal Medicine

## 2018-01-28 ENCOUNTER — Encounter: Payer: Self-pay | Admitting: Internal Medicine

## 2018-01-28 ENCOUNTER — Other Ambulatory Visit: Payer: Self-pay

## 2018-01-28 VITALS — BP 115/58 | HR 74 | Temp 99.0°F | Ht 63.0 in | Wt 323.3 lb

## 2018-01-28 DIAGNOSIS — Z79891 Long term (current) use of opiate analgesic: Secondary | ICD-10-CM | POA: Diagnosis not present

## 2018-01-28 DIAGNOSIS — G4733 Obstructive sleep apnea (adult) (pediatric): Secondary | ICD-10-CM | POA: Diagnosis not present

## 2018-01-28 DIAGNOSIS — Z6841 Body Mass Index (BMI) 40.0 and over, adult: Secondary | ICD-10-CM | POA: Diagnosis not present

## 2018-01-28 DIAGNOSIS — F332 Major depressive disorder, recurrent severe without psychotic features: Secondary | ICD-10-CM

## 2018-01-28 MED ORDER — METFORMIN HCL 500 MG PO TABS: 500.0000 mg | ORAL_TABLET | Freq: Two times a day (BID) | ORAL | 0 refills | Status: DC

## 2018-01-28 MED ORDER — DICLOFENAC SODIUM 1 % TD GEL: 2.0000 g | Freq: Four times a day (QID) | TRANSDERMAL | 0 refills | Status: DC

## 2018-01-28 MED ORDER — TRAMADOL HCL 50 MG PO TABS: 50.0000 mg | ORAL_TABLET | Freq: Two times a day (BID) | ORAL | 0 refills | Status: DC | PRN

## 2018-01-28 NOTE — Progress Notes (Signed)
CC: Follow-up obesity  HPI:  [name] is a 67 y.o. with a past medical history of hypertension, combined heart failure, sleep apnea, CKD, depression, obesity, gout, and otherwise described below who presents to the clinic for follow-up of obesity, chronic back and knee pain.  Chronic Pain with Opiate use: She reports recent increase in her chronic back and knee pain which is partially relieved with tramadol 50 mg which she takes 2 times daily.  She notes that she has recently had more of a caretaker role for her husband after he had a CABG.  She feels this increased movement as well as stress has contributed to increased pain, as well as increased fatigue.  Obesity: Weight loss remains important to her and she expresses frustration as pharmacologic agents have been limited due to financial constraints.  She has been intermittently using Victoza samples when able to be obtained from the clinic.  Exercise limited by chronic pain.  She states that she feels she has been gaining weight due to not being on medication regularly.  Sleep apnea: She again reports that she still has CPAP machine but has not resumed using it as discussed on last visit.  Past Medical History:  Diagnosis Date  . Acute respiratory failure with hypoxia ([place]) 09/26/2012  . Bilateral knee pain   . Cellulitis of lower leg    left  . Chronic combined systolic and diastolic congestive heart failure ([place]) 10/10/2012   2 D echo 09/2012:  Left ventricle: Extremely poor acoustic windows limit study. Overall LVEF appears to be severely depressed. Recomm ordering limited study with contrast to further evaluate LV systolic function. The cavity size was normal. Wall thickness was normal. Doppler parameters are consistent with abnormal left ventricular relaxation (grade 1 diastolic dysfunction).   2 D echo 03/2009 :  Left ventricle: The cavity size was normal. There was mild concentric hypertrophy. Systolic function was mildly  reduced. The estimated ejection fraction was in the range of 45% to 50%. Diffuse hypokinesis. Doppler parameters are consistent with abnormal left ventricular relaxation (grade 1 diastolic dysfunction).     . Chronic kidney disease 10/10/2012   Stage 2 with GFR of 64   . Depression   . Gout   . H/O: hysterectomy   . Hyperlipidemia   . Hypertension   . Malignant hypertension 09/26/2012  . Morbid obesity ([place])   . Nonischemic cardiomyopathy ([place])   . Sleep apnea    on cpap  . Unspecified essential hypertension 03/22/2009   Qualifier: Diagnosis of  By: Karrie Meres, RN, BSN, [name]     Review of Systems:  Review of Systems  Constitutional: Positive for malaise/fatigue. Negative for weight loss.  Cardiovascular: Negative for chest pain.  Musculoskeletal: Positive for joint pain. Negative for falls.  Psychiatric/Behavioral: Positive for depression.     Physical Exam:  Vitals:   01/28/18 1055  BP: (!) 115/58  Pulse: 74  Temp: 99 F (37.2 C)  TempSrc: Oral  SpO2: 97%  Weight: (!) 323 lb 4.8 oz (146.6 kg)  Height: _0  (1.6 m)   General: Obese female sitting in chair comfortably, no acute distress HEENT: Moist mucus membranes, normal conjuctiva CV: RRR Resp: Clear breath sounds bilaterally, normal work of breathing, no distress  Abd: Soft, +BS, obese Extr: No significant TTP of bilateral knees, no joint effusion appreciated though difficult with habitus  Neuro: Alert and oriented x3, normal gait  Skin: Warm, dry      Assessment & Plan:   See Encounters  Tab for problem based charting.  Patient discussed with Dr. Rebeca Alert

## 2018-01-28 NOTE — Patient Instructions (Signed)
Nice to see you again Robin Hardin.  Start using your CPAP machine again, this will be important to help improve your fatigue and also help with weight loss.  For your pain, we are increasing the dose of your tramadol, you can take 1 or 2 tablets at a time 2 times daily.  Continue taking Tylenol.  Also prescribed a topical anti-inflammatory medicine is prescription strength that comes in a gel that he can apply to areas that are painful.  I am not sure of the cost of this medicine, but if it is too expensive then hopefully the increased dose of tramadol should help.  We do not have any Victoza samples right now, but Dr. Maudie Mercury is working on getting more samples to the clinic to have more available.  In the meantime, we will restart metformin. I have also included information about surgical options to read about.    Bariatric Surgery Information Bariatric surgery, also called weight loss surgery, is a procedure that helps you lose weight. You may consider or your health care provider may suggest bariatric surgery if:  You are severely obese and have been unable to lose weight through diet and exercise.  You have health problems related to obesity, such as: ? Type 2 diabetes. ? Heart disease. ? Lung disease.  How does bariatric surgery help me lose weight? Bariatric surgery helps you lose weight by decreasing how much food your body absorbs. This is done by closing off part of your stomach to make it smaller. This restricts the amount of food your stomach can hold. Bariatric surgery can also change your body's regular digestive process, so that food bypasses the parts of your body that absorb calories and nutrients. If you decide to have bariatric surgery, it is important to continue to eat a healthy diet and exercise regularly after the surgery. What are the different kinds of bariatric surgery? There are two kinds of bariatric surgeries:  Restrictive surgeries make your stomach smaller. They do  not change your digestive process. The smaller the size of your new stomach, the less food you can eat. There are different types of restrictive surgeries.  Malabsorptive surgeries both make your stomach smaller and alter your digestive process so that your body processes less calories and nutrients. These are the most common kind of bariatric surgery. There are different types of malabsorptive surgeries.  What are the different types of restrictive surgery? Adjustable Gastric Banding In this procedure, an inflatable band is placed around your stomach near the upper end. This makes the passageway for food into the rest of your stomach much smaller. The band can be adjusted, making it tighter or looser, by filling it with salt solution. Your surgeon can adjust the band based on how are you feeling and how much weight you are losing. The band can be removed in the future. Vertical Banded Gastroplasty In this procedure, staples are used to separate your stomach into two parts, a small upper pouch and a bigger lower pouch. This decreases how much food you can eat. Sleeve Gastrectomy In this procedure, your stomach is made smaller. This is done by surgically removing a large part of your stomach. When your stomach is smaller, you feel full more quickly and reduce how much you eat. What are the different types of malabsorptive surgery? Roux-en-Y Gastric Bypass (RGB) This is the most common weight loss surgery. In this procedure, a small stomach pouch is created in the upper part of your stomach. Next, this small  stomach pouch is attached directly to the middle part of your small intestine. The farther down your small intestine the new connection is made, the fewer calories and nutrients you will absorb. Biliopancreatic Diversion with Duodenal Switch (BPD/DS) This is a multi-step procedure. In this procedure, a large part of your stomach is removed, making your stomach smaller. Next, this smaller stomach is  attached to the lower part of your small intestine. Like the RGB surgery, you absorb fewer calories and nutrients the farther down your small intestine the attachment is made. What are the risks of bariatric surgery? As with any surgical procedure, each type of bariatric surgery has its own risks. These risks also depend on your age, your overall health, and any other medical conditions you may have. When deciding on bariatric surgery, it is very important to:  Talk to your health care provider and choose the surgery that is best for you.  Ask your health care provider about specific risks for the surgery you choose.  Where to find more information:  American Society for Metabolic & Bariatric Surgery: www.asmbs.org  Weight-control Information Network (WIN): win.AmenCredit.is This information is not intended to replace advice given to you by your health care provider. Make sure you discuss any questions you have with your health care provider. Document Released: 08/06/2005 Document Revised: 01/12/2016 Document Reviewed: 02/04/2013 Elsevier Interactive Patient Education  2017 [employer].

## 2018-02-02 ENCOUNTER — Encounter: Payer: Self-pay | Admitting: Internal Medicine

## 2018-02-02 NOTE — Assessment & Plan Note (Signed)
She follows with psychiatry for management of her depression.  Currently on duloxetine which may also help with her chronic pain.  Abilify was also added for improved control that she expressed a decreased mood.  A decrease in her mood is likely a factor in her reported increase in fatigue.  Abilify typically weight neutral

## 2018-02-02 NOTE — Assessment & Plan Note (Addendum)
Unfortunately she did not resume using her prior CPAP machine after her last visit.  She is again counseled that treating her sleep apnea using CPAP is likely to be helpful in the treatment of her weight loss, her reports of increased fatigue, and other comorbidities.  She expressed understanding and will hopefully resume CPAP.

## 2018-02-02 NOTE — Assessment & Plan Note (Signed)
Weight loss continues to be her priority during the visit.  Her weight today is 323 pounds, stable from her last visit several months ago.  She has been irregularly using liraglutide as a weight loss age, however available pens only able to administer a 1.8 mg dose rather than the 3.0 mg weight loss dose.  She has not been approved for various assistance programs for liraglutide and other pharmacologic agents are also prohibitively expensive.  She has previously been hesitant with weight loss surgery, but is agreeable to reading information which was provided today.  She also inquired about metformin as she felt she experience weight loss with this medication in the past when she was treated for prediabetes.  Those studies are limited in use of metformin for weight loss in nondiabetic patients, will restart metformin as a temporary measure.  Will also place referral for further education regarding nutrition.

## 2018-02-02 NOTE — Assessment & Plan Note (Signed)
Patient has chronic pain of her back and bilateral knees previously controlled with hydrocodone-acetaminophen but transition to tramadol around October 2018.  She reports an increase in her pain attributed to increased activity from increased household responsibility due to her husband's health.  Will increase tramadol dose to 50-100 mg twice daily as needed #65.  Also add Voltaren gel as an adjunct of treatment.

## 2018-02-04 ENCOUNTER — Ambulatory Visit (INDEPENDENT_AMBULATORY_CARE_PROVIDER_SITE_OTHER): Payer: Medicare HMO | Admitting: Internal Medicine

## 2018-02-04 ENCOUNTER — Encounter: Payer: Self-pay | Admitting: Internal Medicine

## 2018-02-04 ENCOUNTER — Other Ambulatory Visit: Payer: Self-pay

## 2018-02-04 ENCOUNTER — Other Ambulatory Visit: Payer: Self-pay | Admitting: Internal Medicine

## 2018-02-04 DIAGNOSIS — G4730 Sleep apnea, unspecified: Secondary | ICD-10-CM

## 2018-02-04 DIAGNOSIS — F339 Major depressive disorder, recurrent, unspecified: Secondary | ICD-10-CM | POA: Diagnosis not present

## 2018-02-04 DIAGNOSIS — M25569 Pain in unspecified knee: Secondary | ICD-10-CM | POA: Diagnosis not present

## 2018-02-04 DIAGNOSIS — I130 Hypertensive heart and chronic kidney disease with heart failure and stage 1 through stage 4 chronic kidney disease, or unspecified chronic kidney disease: Secondary | ICD-10-CM

## 2018-02-04 DIAGNOSIS — M549 Dorsalgia, unspecified: Secondary | ICD-10-CM | POA: Diagnosis not present

## 2018-02-04 DIAGNOSIS — G8929 Other chronic pain: Secondary | ICD-10-CM | POA: Diagnosis not present

## 2018-02-04 DIAGNOSIS — Z7409 Other reduced mobility: Secondary | ICD-10-CM | POA: Diagnosis not present

## 2018-02-04 DIAGNOSIS — I5042 Chronic combined systolic (congestive) and diastolic (congestive) heart failure: Secondary | ICD-10-CM | POA: Diagnosis not present

## 2018-02-04 DIAGNOSIS — I10 Essential (primary) hypertension: Secondary | ICD-10-CM

## 2018-02-04 DIAGNOSIS — N189 Chronic kidney disease, unspecified: Secondary | ICD-10-CM

## 2018-02-04 DIAGNOSIS — M159 Polyosteoarthritis, unspecified: Secondary | ICD-10-CM

## 2018-02-04 DIAGNOSIS — M109 Gout, unspecified: Secondary | ICD-10-CM

## 2018-02-04 DIAGNOSIS — Z6841 Body Mass Index (BMI) 40.0 and over, adult: Secondary | ICD-10-CM

## 2018-02-04 NOTE — Patient Instructions (Addendum)
Good to see you again Robin Hardin.   We have completed the visit for a mobility evaluation for the process of a power wheelchair. The clinic staff and Robin Hardin will coordinate further paperwork and applications for the power wheelchair. This process may take a couple months  Otherwise, we will see you for regular follow up visits like usual.

## 2018-02-04 NOTE — Assessment & Plan Note (Signed)
Patient presenting for evaluation for a power chair due to impaired mobility for.  Please refer to HPI and exam criteria supporting the appropriateness of device.

## 2018-02-04 NOTE — Progress Notes (Addendum)
CC: Mobility evaluation  HPI:  Robin Hardin is a 67 y.o. 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  Robin Hardin is here for a mobility examination for powerchair assessment. 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 The patient requires the powerchair to complete ADL's including those related to being mobile about her home such as cooking/feeding, bathing. The patient is  unable to use a cane or walker due to back and knee pain, SOB while using these assistive devices and unable to use a manual chair also due to increased pain and dyspnea during operation. The patient is unable to use a scooter due to difficulty maneuvering the scooter in her home and with her size with obesity. Transfers would also be difficult with a power scooter device. The patient's pain today is a 4 on a scale of 1-10. The patient's muscle strength rated on a 0-5 scale is listed in the exam. Robin Hardin has the physical and mental ability to operate a power wheelchair safely in his home and is motivated to use the power wheelchair.    Past Medical History:  Diagnosis Date  . Acute respiratory failure with hypoxia ([place]) 09/26/2012  . Bilateral knee pain   . Cellulitis of lower leg    left  . Chronic combined systolic and diastolic congestive heart failure ([place]) 10/10/2012   2 D echo 09/2012:  Left ventricle: Extremely poor acoustic windows limit study. Overall LVEF appears to be severely depressed. Recomm ordering limited study with contrast to further evaluate LV systolic function. The cavity size was normal. Wall thickness was normal.  Doppler parameters are consistent with abnormal left ventricular relaxation (grade 1 diastolic dysfunction).   2 D echo 03/2009 :  Left ventricle: The cavity size was normal. There was mild concentric hypertrophy. Systolic function was mildly reduced. The estimated ejection fraction was in the range of 45% to 50%. Diffuse hypokinesis. Doppler parameters are consistent with abnormal left ventricular relaxation (grade 1 diastolic dysfunction).     . Chronic kidney disease 10/10/2012   Stage 2 with GFR of 64   . Depression   . Gout   . H/O: hysterectomy   . Hyperlipidemia   . Hypertension   . Malignant hypertension 09/26/2012  . Morbid obesity ([place])   . Nonischemic cardiomyopathy ([place])   . Sleep apnea    on cpap  . Unspecified essential hypertension 03/22/2009   Qualifier: Diagnosis of  By: Karrie Meres, RN, BSN, [name]     Review of Systems:  Review of Systems  Respiratory: Positive for shortness of breath.   Musculoskeletal: Positive for back pain and joint pain.     Physical Exam:  Vitals:   02/04/18 1538  BP: 118/74  Pulse: 74  Temp: 98.5 F (36.9 C)  TempSrc: Oral  SpO2: 99%  Weight: (!) 315 lb 4.8 oz (143 kg)  Height: _0  (1.6 m)   General: Obese female sitting in clinic wheelchair comfortably, no acute distress Abd: Soft, +BS, obese Extr/MSK: TTP of lumbar spine and lumbar paraspinal musculature. TTP of L knee at medial and lateral joint lines and suprapatellar space. TTP of R knee at medial joint line. 4/5 bilateral upper extremity strength proximally, 5/5 distally. 4/5  bilateral lower proximal lower extremity strength, 5/5 distally. Neuro: Alert and oriented x3,      Assessment & Plan:   See Encounters Tab for problem based charting.  Patient discussed with Dr. Lynnae January

## 2018-02-06 NOTE — Progress Notes (Signed)
Internal Medicine Clinic Attending  Case discussed with Dr. Johny Chess at the time of the visit.  We reviewed the resident's history and exam and pertinent patient test results.  I agree with the assessment, diagnosis, and plan of care documented in the resident's note.

## 2018-02-08 NOTE — Progress Notes (Signed)
Internal Medicine Clinic Attending  Case discussed with Dr. Johny Chess  at the time of the visit.  We reviewed the resident's history and exam and pertinent patient test results.  I agree with the assessment, diagnosis, and plan of care documented in the resident's note.  Oda Kilts, MD

## 2018-02-17 ENCOUNTER — Telehealth (HOSPITAL_COMMUNITY): Payer: Self-pay

## 2018-02-17 ENCOUNTER — Other Ambulatory Visit (HOSPITAL_COMMUNITY): Payer: Self-pay | Admitting: Psychiatry

## 2018-02-17 DIAGNOSIS — R4189 Other symptoms and signs involving cognitive functions and awareness: Secondary | ICD-10-CM

## 2018-02-17 DIAGNOSIS — F332 Major depressive disorder, recurrent severe without psychotic features: Secondary | ICD-10-CM

## 2018-02-17 MED ORDER — ARIPIPRAZOLE 5 MG PO TABS: 5.0000 mg | ORAL_TABLET | Freq: Every day | ORAL | 0 refills | Status: DC

## 2018-02-17 NOTE — Telephone Encounter (Signed)
I have increased the dose to 5 mg of Abilify and have sent in a 90-day prescription refill for that and for belsomra

## 2018-02-17 NOTE — Telephone Encounter (Signed)
Patient called stating that her medication Abilify is working very well and she is requesting to have this medication increased. Patient also stated that her Belsomra 75m prescription can it be written as a 90 day supply. It cheaper for her with insurance that way.Next appointment is scheduled for 03-12-18 Please advise

## 2018-02-18 ENCOUNTER — Ambulatory Visit (HOSPITAL_COMMUNITY): Payer: Medicare HMO | Admitting: Psychiatry

## 2018-02-18 NOTE — Telephone Encounter (Signed)
Called and informed patient of below message

## 2018-02-24 ENCOUNTER — Other Ambulatory Visit: Payer: Self-pay | Admitting: Internal Medicine

## 2018-02-24 NOTE — Telephone Encounter (Signed)
Reviewed chart.  Dose appropriate.  Also reviewed [place] narcotic database and refill history in recent history appropriate.

## 2018-03-04 ENCOUNTER — Ambulatory Visit: Payer: Medicare HMO | Admitting: Neurology

## 2018-03-07 ENCOUNTER — Telehealth (HOSPITAL_COMMUNITY): Payer: Self-pay | Admitting: *Deleted

## 2018-03-07 NOTE — Telephone Encounter (Signed)
Medication Samples have been provided to the patient.  Drug name: [name]      Strength: 24-78m     Qty: 2  LOT: [id number] Exp.Date: 10/21  Dosing instructions: Take 1 Tablet PO BID The patient has been instructed regarding the correct time, dose, and frequency of taking this medication, including desired effects and most common side effects.   FDarron Doom10:32 AM 03/07/2018

## 2018-03-10 ENCOUNTER — Encounter: Payer: Self-pay | Admitting: *Deleted

## 2018-03-11 ENCOUNTER — Encounter: Payer: Self-pay | Admitting: Dietician

## 2018-03-12 ENCOUNTER — Encounter (HOSPITAL_COMMUNITY): Payer: Self-pay | Admitting: Psychiatry

## 2018-03-12 ENCOUNTER — Ambulatory Visit (INDEPENDENT_AMBULATORY_CARE_PROVIDER_SITE_OTHER): Payer: Medicare HMO | Admitting: Psychiatry

## 2018-03-12 DIAGNOSIS — F332 Major depressive disorder, recurrent severe without psychotic features: Secondary | ICD-10-CM

## 2018-03-12 DIAGNOSIS — R4189 Other symptoms and signs involving cognitive functions and awareness: Secondary | ICD-10-CM | POA: Diagnosis not present

## 2018-03-12 MED ORDER — SUVOREXANT 10 MG PO TABS: 10.0000 mg | ORAL_TABLET | Freq: Every evening | ORAL | 0 refills | Status: DC | PRN

## 2018-03-12 MED ORDER — DULOXETINE HCL 60 MG PO CPEP: 120.0000 mg | ORAL_CAPSULE | Freq: Every day | ORAL | 1 refills | Status: DC

## 2018-03-12 MED ORDER — ARIPIPRAZOLE 5 MG PO TABS: 5.0000 mg | ORAL_TABLET | Freq: Every day | ORAL | 0 refills | Status: DC

## 2018-03-12 NOTE — Progress Notes (Signed)
[facility] MD/PA/NP OP Progress Note  03/12/2018 11:21 AM Robin Hardin  MRN:  [id number]  Chief Complaint: hell and back trying to get here  HPI: Robin Hardin presents 25 minutes late for her visit once again.  She reports that whenever she has to leave the house, she said no realizes she needs to go pee and this makes her run late because it takes so long for her to get up and down.  She is apologetic.  We spent time reviewing the Abilify and she has had a very positive response to Abilify and denies any significant side effects, tremulousness, parkinsonism, EPS of symptoms.  Agrees to remain at 5 mg in conjunction with Cymbalta and reports that she sleeps quite well at night with the belsomra.  Things have been going well with her husband, and he is making a gradual recovery which has been quite a blessing.    She understands this will be our last visit and she is scheduled to see Robin Hardin in 3 months for a transition of care.  Visit Diagnosis:    ICD-10-CM   1. 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 Reviewed, with no updates at this time.  Past Medical History:  Past Medical History:  Diagnosis Date  . Acute respiratory failure with hypoxia ([place]) 09/26/2012  . Bilateral knee pain   . Cellulitis of lower leg    left  . Chronic combined systolic and diastolic congestive heart failure ([place]) 10/10/2012   2 D echo 09/2012:  Left ventricle: Extremely poor acoustic windows limit study. Overall LVEF appears to be severely depressed. Recomm ordering limited study with contrast to further evaluate LV systolic function. The cavity size was normal. Wall thickness was normal. Doppler parameters are consistent with  abnormal left ventricular relaxation (grade 1 diastolic dysfunction).   2 D echo 03/2009 :  Left ventricle: The cavity size was normal. There was mild concentric hypertrophy. Systolic function was mildly reduced. The estimated ejection fraction was in the range of 45% to 50%. Diffuse hypokinesis. Doppler parameters are consistent with abnormal left ventricular relaxation (grade 1 diastolic dysfunction).     . Chronic kidney disease 10/10/2012   Stage 2 with GFR of 64   . Depression   . Gout   . H/O: hysterectomy   . Hyperlipidemia   . Hypertension   . Malignant hypertension 09/26/2012  . Morbid obesity ([place])   . Nonischemic cardiomyopathy ([place])   . Sleep apnea    on cpap  . Unspecified essential hypertension 03/22/2009   Qualifier: Diagnosis of  By: Karrie Meres, RN, BSN, [name]     No past surgical history on file.  Family Psychiatric History: See intake H&P for full details. Reviewed, with no updates at this time.   Family History:  Family History  Problem Relation Age of Onset  . Asthma Mother   . Heart disease Mother   . Stroke Mother        clotting disorders  . Asthma Brother   . Asthma Brother     Social History:  Social History   Socioeconomic History  . Marital status: Married    Spouse name: Not on file  . Number of children: 2  . Years of education:  Not on file  . Highest education level: Not on file  Occupational History  . Occupation: retired Forensic psychologist: UNEMPLOYED  Social Needs  . Financial resource strain: Not on file  . Food insecurity:    Worry: Not on file    Inability: Not on file  . Transportation needs:    Medical: Not on file    Non-medical: Not on file  Tobacco Use  . Smoking status: Never Smoker  . Smokeless tobacco: Never Used  Substance and Sexual Activity  . Alcohol use: No    Alcohol/week: 0.0 oz  . Drug use: No  . Sexual activity: Not Currently    Partners: Male  Lifestyle  . Physical activity:    Days per week: Not on file     Minutes per session: Not on file  . Stress: Not on file  Relationships  . Social connections:    Talks on phone: Not on file    Gets together: Not on file    Attends religious service: Not on file    Active member of club or organization: Not on file    Attends meetings of clubs or organizations: Not on file    Relationship status: Not on file  Other Topics Concern  . Not on file  Social History Narrative  . Not on file    Allergies: No Known Allergies  Metabolic Disorder Labs: Lab Results  Component Value Date   HGBA1C 5.5 11/08/2015   MPG 123 (H) 09/26/2012   MPG 148 (H) 05/26/2010   No results found for: PROLACTIN Lab Results  Component Value Date   CHOL 196 06/11/2016   TRIG 81 06/11/2016   HDL 58 06/11/2016   CHOLHDL 3.4 06/11/2016   VLDL 16 06/11/2016   LDLCALC 122 (H) 06/11/2016   LDLCALC 221 (H) 01/11/2016   Lab Results  Component Value Date   TSH 1.030 03/20/2017   TSH 1.295 12/28/2014    Therapeutic Level Labs: No results found for: LITHIUM No results found for: VALPROATE No components found for:  CBMZ  Current Medications: Current Outpatient Medications  Medication Sig Dispense Refill  . acetaminophen (TYLENOL) 500 MG tablet Take 2 tablets (1,000 mg total) by mouth every 8 (eight) hours as needed. 90 tablet 1  . allopurinol (ZYLOPRIM) 300 MG tablet TAKE 1 TABLET EVERY DAY 90 tablet 1  . ALOE PO Take 1 capsule by mouth 2 (two) times daily.    . ARIPiprazole (ABILIFY) 5 MG tablet Take 1 tablet (5 mg total) by mouth daily. 90 tablet 0  . aspirin 81 MG EC tablet Take 1 tablet (81 mg total) by mouth daily. 30 tablet 2  . carvedilol (COREG) 25 MG tablet Take 1 tablet (25 mg total) by mouth 2 (two) times daily with a meal. 180 tablet 3  . diclofenac sodium (VOLTAREN) 1 % GEL Apply 2 g topically 4 (four) times daily. 1 Tube 0  . DULoxetine (CYMBALTA) 60 MG capsule Take 2 capsules (120 mg total) by mouth daily. 180 capsule 1  . ferrous sulfate 325 (65 FE)  MG EC tablet Take 325 mg by mouth daily with breakfast.    . furosemide (LASIX) 80 MG tablet TAKE 1 TABLET TWICE DAILY 180 tablet 5  . liraglutide (VICTOZA) 18 MG/3ML SOPN Inject 0.3 mLs (1.8 mg total) into the skin daily. 3 pen 2  . metFORMIN (GLUCOPHAGE) 500 MG tablet Take 1 tablet (500 mg total) by mouth 2 (two) times daily with a meal. 180  tablet 0  . potassium chloride SA (K-DUR,KLOR-CON) 20 MEQ tablet     . rosuvastatin (CRESTOR) 20 MG tablet Take 1 tablet (20 mg total) by mouth daily. 30 tablet 11  . sacubitril-valsartan (ENTRESTO) 24-26 MG Take 1 tablet by mouth 2 (two) times daily. 60 tablet 3  . spironolactone (ALDACTONE) 25 MG tablet Take 0.5 tablets (12.5 mg total) by mouth daily. 90 tablet 3  . Suvorexant (BELSOMRA) 10 MG TABS Take 10 mg by mouth at bedtime as needed (for sleep). 90 tablet 0  . traMADol (ULTRAM) 50 MG tablet TAKE 1-2 TABLETS BY MOUTH 2 TIMES DAILY AS NEEDED. 65 tablet 0  . Vitamin D, Ergocalciferol, (DRISDOL) 50000 units CAPS capsule Take 1 capsule (50,000 Units total) by mouth every 7 (seven) days. Take 1 capsule every 7 days, until the bottle is finished 8 capsule 0   No current facility-administered medications for this visit.     Musculoskeletal: Strength & Muscle Tone: within normal limits Gait & Station: normal Patient leans: slow walk, pain from back/joints  Psychiatric Specialty Exam: ROS  There were no vitals taken for this visit.There is no height or weight on file to calculate BMI.  General Appearance: Casual and Fairly Groomed  Eye Contact:  Good  Speech:  Clear and Coherent and Normal Rate  Volume:  Normal  Mood:  Euthymic and Better  Affect:  Congruent  Thought Process:  Goal Directed and Descriptions of Associations: Intact  Orientation:  Full (Time, Place, and Person)  Thought Content: Logical   Suicidal Thoughts:  No  Homicidal Thoughts:  No  Memory:  Immediate;   Poor  Judgement:  Fair  Insight:  Shallow  Psychomotor Activity:   Normal  Concentration:  Concentration: Poor  Recall:  Poor  Fund of Knowledge: Poor  Language: Fair  Akathisia:  Negative  Handed:  Left  AIMS (if indicated): not done  Assets:  Communication Skills Desire for Improvement Housing  ADL's:  Intact  Cognition: Impaired,  Mild  Sleep:  Good   Screenings: PHQ2-9     Office Visit from 02/04/2018 in [facility] Office Visit from 01/28/2018 in [facility] Office Visit from 10/15/2017 in [facility] Office Visit from 05/21/2017 in [facility] Office Visit from 04/04/2017 in [facility]  PHQ-2 Total Score  4  0  0  4  4  PHQ-9 Total Score  10  -  -  8  10       Assessment and Plan:  [name] presents with significant improvement of her symptoms of depression with augmentation using Abilify.  She continues to have difficulty making it to her visits on time and presents today nearly 30 minutes late.  She is up to the 5 mg dose and denies any significant intolerance.  No significant concerns about side effects, and she continues on Cymbalta and suvorexant for sleep.  She will follow-up with Robin Hardin in 3 months or sooner if needed.  1. Severe episode of recurrent major depressive disorder, without psychotic features ([place])   2. Pseudodementia     Status of current problems: unchanged  Labs Ordered: No orders of the defined types were placed in this encounter.   Labs Reviewed: na  Collateral Obtained/Records Reviewed: na  Plan:  Continue Belsomra 10 mg nightly Continue Cymbalta 60 mg twice a day Abilify 4 mg daily RTC 3 months  Aundra Dubin, MD 03/12/2018, 11:21  AM

## 2018-03-14 ENCOUNTER — Encounter (HOSPITAL_COMMUNITY): Payer: Self-pay | Admitting: Cardiology

## 2018-03-18 ENCOUNTER — Ambulatory Visit: Payer: Medicare HMO | Admitting: Dietician

## 2018-03-18 ENCOUNTER — Encounter: Payer: Self-pay | Admitting: Dietician

## 2018-03-18 ENCOUNTER — Other Ambulatory Visit: Payer: Self-pay | Admitting: Dietician

## 2018-03-18 DIAGNOSIS — Z6841 Body Mass Index (BMI) 40.0 and over, adult: Principal | ICD-10-CM

## 2018-03-18 NOTE — Progress Notes (Signed)
opened in error

## 2018-03-18 NOTE — Patient Instructions (Addendum)
Your plan for weight loss:  Make a big bowl of vegetables every day to help me eat more vegetables for the next week  I can eat any vegetable that is in my house- canned or frozen are fine.

## 2018-03-18 NOTE — Progress Notes (Signed)
  Medical Nutrition Therapy:  Appt start time: 1400 end time:  1500. Visit # 2 saw her in 2017 for CKD and weight loss  Assessment:  Primary concerns today: weight loss  Robin Hardin has been getting victoza pen samples and she feels this and metformin help with her weight loss. She denies diabetes. She reports 100# weight loss in the past and then regained it. She is in a wheelchair now and uses her husband's wheelchair at home often so that she worries when he goes ot dialysis because she won't have it. She walked fine today for a short distacne. She reports she gets very tired especially going upstairs in her two level house. She is thinking about weight loss surgery but has fears about it because her daughter died a few days after having a weight loss surgery. She rates importance of weight loss to her a 5/5 and 4/5 on confidence that she can decrease her weight.  Preferred Learning Style: No preference indicated  Learning Readiness: Contemplating  ANTHROPOMETRICS: Estimated body mass index is 55.85 kg/m as calculated from the following:   Height as of 02/04/18: _0  (1.6 m).   Weight as of 02/04/18: 315 lb 4.8 oz (143 kg).  WEIGHT HISTORY:  Wt Readings from Last 5 Encounters:  02/04/18 (!) 315 lb 4.8 oz (143 kg)  01/28/18 (!) 323 lb 4.8 oz (146.6 kg)  10/15/17 (!) 324 lb 6.4 oz (147.1 kg)  05/21/17 (!) 311 lb 6.4 oz (141.3 kg)  04/04/17 (!) 305 lb 14.4 oz (138.8 kg)    SLEEP:she reports this is a problem but did not report using CPAP, note in her chart she has problems using CPAP for [facility]SA MEDICATIONS: DIETARY INTAKE: Usual eating pattern includes 2 meals and 2 snacks per day. 24-hr recall:  B ( AM): 1- packs instant oatmeal,    L ( PM): fruit Snk ( PM): fruit D ( PM): vegetables, meat, rice and gravy Snk ( PM): fruit Beverages: water, juice, tea with honey, lemon and vinegar  Usual physical activity: very sedentary  Estimated daily energy needs for weight loss: ~1500  calories  70-100 g protein  Progress Towards Goal(s):  In progress.   Nutritional Diagnosis:  NI-1.5 Excessive energy intake As related to excess caloric intake compared to caloric expenditure.  As evidenced by her decreased physical activity.    Intervention:  Nutrition education about weight loss, used motivational interviewing to assist her in exploring ambivalence surrounding behavior change and desire for weight loss. Coordination of care: assisted Dr. Evette Doffing in signing out a sample of victoza per patients request.   Teaching Method Utilized: Visual,Auditory, Hands on Handouts given during visit include: AVS Barriers to learning/adherence to lifestyle change: depression and competing values Demonstrated degree of understanding via:  Teach Back   Monitoring/Evaluation:  Dietary intake, exercise, and body weight in 1 week(s). Robin Hardin, RD 03/18/2018 4:49 PM.

## 2018-03-19 NOTE — Progress Notes (Signed)
I thought medicare covered victoza? I can put in an order to her pharmacy.

## 2018-03-24 ENCOUNTER — Other Ambulatory Visit (HOSPITAL_COMMUNITY): Payer: Self-pay | Admitting: Psychiatry

## 2018-03-24 DIAGNOSIS — F332 Major depressive disorder, recurrent severe without psychotic features: Secondary | ICD-10-CM

## 2018-03-24 MED ORDER — METFORMIN HCL 500 MG PO TABS: 500.0000 mg | ORAL_TABLET | Freq: Two times a day (BID) | ORAL | 0 refills | Status: DC

## 2018-03-24 MED ORDER — DULOXETINE HCL 60 MG PO CPEP: 120.0000 mg | ORAL_CAPSULE | Freq: Every day | ORAL | 1 refills | Status: DC

## 2018-03-25 ENCOUNTER — Encounter: Payer: Self-pay | Admitting: Internal Medicine

## 2018-03-25 ENCOUNTER — Ambulatory Visit: Payer: Self-pay | Admitting: Dietician

## 2018-04-07 ENCOUNTER — Ambulatory Visit: Payer: Medicare HMO | Admitting: Neurology

## 2018-04-07 ENCOUNTER — Other Ambulatory Visit: Payer: Self-pay | Admitting: Internal Medicine

## 2018-04-09 ENCOUNTER — Encounter: Payer: Self-pay | Admitting: *Deleted

## 2018-04-25 ENCOUNTER — Other Ambulatory Visit (HOSPITAL_COMMUNITY): Payer: Self-pay

## 2018-04-29 ENCOUNTER — Encounter: Payer: Self-pay | Admitting: Internal Medicine

## 2018-04-30 ENCOUNTER — Encounter (HOSPITAL_COMMUNITY): Payer: Self-pay | Admitting: Cardiology

## 2018-04-30 ENCOUNTER — Ambulatory Visit (HOSPITAL_COMMUNITY)
Admission: RE | Admit: 2018-04-30 | Discharge: 2018-04-30 | Disposition: A | Discharge status: Home / Self Care | Payer: Medicare HMO | Source: Ambulatory Visit | Attending: Cardiology | Admitting: Cardiology

## 2018-04-30 ENCOUNTER — Other Ambulatory Visit: Payer: Self-pay

## 2018-04-30 VITALS — BP 107/63 | HR 78 | Wt 293.2 lb

## 2018-04-30 DIAGNOSIS — M25562 Pain in left knee: Secondary | ICD-10-CM | POA: Insufficient documentation

## 2018-04-30 DIAGNOSIS — I5042 Chronic combined systolic (congestive) and diastolic (congestive) heart failure: Secondary | ICD-10-CM | POA: Diagnosis not present

## 2018-04-30 DIAGNOSIS — E785 Hyperlipidemia, unspecified: Secondary | ICD-10-CM | POA: Insufficient documentation

## 2018-04-30 DIAGNOSIS — Z86718 Personal history of other venous thrombosis and embolism: Secondary | ICD-10-CM | POA: Diagnosis not present

## 2018-04-30 DIAGNOSIS — I130 Hypertensive heart and chronic kidney disease with heart failure and stage 1 through stage 4 chronic kidney disease, or unspecified chronic kidney disease: Secondary | ICD-10-CM | POA: Insufficient documentation

## 2018-04-30 DIAGNOSIS — I429 Cardiomyopathy, unspecified: Secondary | ICD-10-CM | POA: Insufficient documentation

## 2018-04-30 DIAGNOSIS — E1122 Type 2 diabetes mellitus with diabetic chronic kidney disease: Secondary | ICD-10-CM | POA: Diagnosis not present

## 2018-04-30 DIAGNOSIS — Z8249 Family history of ischemic heart disease and other diseases of the circulatory system: Secondary | ICD-10-CM | POA: Insufficient documentation

## 2018-04-30 DIAGNOSIS — M109 Gout, unspecified: Secondary | ICD-10-CM | POA: Insufficient documentation

## 2018-04-30 DIAGNOSIS — N189 Chronic kidney disease, unspecified: Secondary | ICD-10-CM | POA: Diagnosis not present

## 2018-04-30 DIAGNOSIS — Z7984 Long term (current) use of oral hypoglycemic drugs: Secondary | ICD-10-CM | POA: Insufficient documentation

## 2018-04-30 DIAGNOSIS — G4733 Obstructive sleep apnea (adult) (pediatric): Secondary | ICD-10-CM | POA: Insufficient documentation

## 2018-04-30 DIAGNOSIS — F329 Major depressive disorder, single episode, unspecified: Secondary | ICD-10-CM | POA: Insufficient documentation

## 2018-04-30 DIAGNOSIS — Z79899 Other long term (current) drug therapy: Secondary | ICD-10-CM | POA: Insufficient documentation

## 2018-04-30 DIAGNOSIS — M25561 Pain in right knee: Secondary | ICD-10-CM | POA: Insufficient documentation

## 2018-04-30 DIAGNOSIS — Z7982 Long term (current) use of aspirin: Secondary | ICD-10-CM | POA: Insufficient documentation

## 2018-04-30 LAB — LIPID PANEL
CHOLESTEROL: 166 mg/dL (ref 0–200)
HDL: 40 mg/dL — ABNORMAL LOW (ref >40)
LDL CALC: 105 mg/dL — AB (ref 0–99)
Total CHOL/HDL Ratio: 4.2 RATIO
Triglycerides: 107 mg/dL (ref <150)
VLDL: 21 mg/dL (ref 0–40)

## 2018-04-30 LAB — COMPREHENSIVE METABOLIC PANEL
ALK PHOS: 82 U/L (ref 38–126)
ALT: 27 U/L (ref 0–44)
AST: 38 U/L (ref 15–41)
Albumin: 3.7 g/dL (ref 3.5–5.0)
Anion gap: 15 (ref 5–15)
BILIRUBIN TOTAL: 0.5 mg/dL (ref 0.3–1.2)
BUN: 20 mg/dL (ref 8–23)
CALCIUM: 9.5 mg/dL (ref 8.9–10.3)
CO2: 29 mmol/L (ref 22–32)
CREATININE: 1.78 mg/dL — AB (ref 0.44–1.00)
Chloride: 99 mmol/L (ref 98–111)
GFR calc Af Amer: 33 mL/min — ABNORMAL LOW (ref >60)
GFR, EST NON AFRICAN AMERICAN: 29 mL/min — AB (ref >60)
Glucose, Bld: 127 mg/dL — ABNORMAL HIGH (ref 70–99)
Potassium: 3.1 mmol/L — ABNORMAL LOW (ref 3.5–5.1)
Sodium: 143 mmol/L (ref 135–145)
Total Protein: 6.6 g/dL (ref 6.5–8.1)

## 2018-04-30 LAB — CBC
HCT: 37.4 % (ref 36.0–46.0)
Hemoglobin: 11.3 g/dL — ABNORMAL LOW (ref 12.0–15.0)
MCH: 29.2 pg (ref 26.0–34.0)
MCHC: 30.2 g/dL (ref 30.0–36.0)
MCV: 96.6 fL (ref 78.0–100.0)
PLATELETS: 201 10*3/uL (ref 150–400)
RBC: 3.87 MIL/uL (ref 3.87–5.11)
RDW: 14.4 % (ref 11.5–15.5)
WBC: 4.1 10*3/uL (ref 4.0–10.5)

## 2018-04-30 MED ORDER — SACUBITRIL-VALSARTAN 49-51 MG PO TABS: 1.0000 | ORAL_TABLET | Freq: Two times a day (BID) | ORAL | 11 refills | Status: DC

## 2018-04-30 NOTE — Patient Instructions (Signed)
INCREASE Entresto to 49/51 mg twice daily. Can "double up" on current 24/26 mg tablets you have at home (Take 2 tabs twice daily). New Rx has been sent to your pharmacy for 49/51 mg tablets (Take 1 tab twice daily).  Routine lab work today. Will notify you of abnormal results, otherwise no news is good news!  EKG today.  Return in 1-2 weeks for lab work and echocardiogram.  _________________________________________________________________ Robin Hardin Code: 1600  Follow up 2 months with Dr. Aundra Dubin.  _________________________________________________________________ Robin Hardin Code: 1800  Take all medication as prescribed the day of your appointment. Bring all medications with you to your appointment.  Do the following things EVERYDAY: 1) Weigh yourself in the morning before breakfast. Write it down and keep it in a log. 2) Take your medicines as prescribed 3) Eat low salt foods-Limit salt (sodium) to 2000 mg per day.  4) Stay as active as you can everyday 5) Limit all fluids for the day to less than 2 liters

## 2018-05-01 ENCOUNTER — Other Ambulatory Visit (HOSPITAL_COMMUNITY): Payer: Self-pay | Admitting: *Deleted

## 2018-05-01 MED ORDER — SACUBITRIL-VALSARTAN 49-51 MG PO TABS: 1.0000 | ORAL_TABLET | Freq: Two times a day (BID) | ORAL | 11 refills | Status: DC

## 2018-05-01 NOTE — Progress Notes (Signed)
[name], female   DOB: 1951-02-08, 67 y.o.   MRN: [id number]    Advanced Heart Failure Clinic Note   Primary Care: Mosetta Anis, MD Primary Cardiologist: Dr. Aundra Dubin  HPI:  Robin Hardin is a 67 y.o. AAF with history of morbid obesity, HTN, and diastolic HF diagnosed in 2130.  She was eventually diagnosed with systolic heart failure by v-gram (09/2012) with an EF approximately 25%.  LHC showed no significant coronary disease.  She also has OSA with CPAP.   She had stopped taking her fluid pills in Feb 2014 due to cramping and ended up being admitted with respiratory failure.  She was diuresed ~40 pounds.  Her torsemide was changed to lasix to avoid cramping.  Echo showed LVEF 25%. Discharge weight 387 lbs. Echo in 12/14 showed EF 40-45% with mild LV dilation.  Last echo in 9/16 showed EF 35-40% with grade II diastolic dysfunction.  She had atypical chest pain in 6/17, Cardiolite at that time showed EF up to 52% with no ischemia/infarction.   She returns for followup of CHF.  I have not seen her in about a year.  She has lost 11 lbs.  Symptomatically stable.  She is not very active. She is limited by back and knee pain.  She taking care of her husband who had CABG recently. She is short of breath walking up stairs.  No dyspnea walking on flat ground but does not walk far.  No orthopnea/PND.    ECG (personally reviewed): NSR, poor RWP.   Labs (7/14): K 3.9 Creatinine 0.91, LDL 105 Labs (06/05/13): K 3.7 Creatinine 0.87 Pro BNP 56  Labs (1/15): K 4, creatinine 0.9 Labs (07/27/14): K 4.2 Creatinine 0.92 Hgb 11.8 HIV NR Labs (5/16): K 5, creatinine 2.57, TSH normal, HCT 36.5 Labs (7/16): K 4, creatinine 1.08, LDL 132 Labs (9/16): K 3.6, creatinine 1.17 Labs (10/16): creatinine 2.19 => 1.34 Labs (11/16): K 4, creatinine 1.0, BNP 114 Labs (5/17): LDL 221, creatinine 2.07 Labs (6/17): K 4, creatinine 1.15 Labs (10/17): LDL 122, HDL 58 Labs (2/18): K 3.7, creatinine 1.28 Labs  (3/18): K 4.4, creatinine 1.17 Labs (8/18): TSH normal  Past Medical History:  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 She was hospitalized at [facility] in [place] where she had a cath showing normal coronaries but EF 30-35% on LV-gram. She has been hospitalized two other times since then in [place] for CHF. Echo (8/10) showed EF 45-50%, mild global HK, mild LVH, grade I diastolic dysfunction, no significant valvular problems.  Echo (2/14) with EF severely decreased (poor windows so not quantified, appears about 25%).  Echo (12/14) with EF 40-45%, mild LV dilation, moderate LVH, moderate LAE.   - Echo (9/16) with EF 35-40%, grade II diastolic dysfunction, mild MR.  - Lexiscan Cardiolite (6/17) with EF 52%, no ischemia/infarction.  2. HTN  3. Depression  4. Morbid obesity  5. Hyperlipidemia  6. OSA: sporadic with CPAP  7. Gout on allopurinol  8. Bilateral knee pain, probable osteoarthritis  9. Diabetes type II  10. Left lower leg cellulitis in 10/11 with normal [place] for DVT 11. CKD.   Family History:  No FH of premature CAD  asthma: mother, 2 brothers  heart disease: mother  clotting disorders: mother (stroke)   Social History:  Recently from [place] to [place].  Married with 2 adopted children.  Never smoked, no ETOH or drugs.  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  Review of Systems  All systems reviewed and negative except as per HPI.   Current Outpatient Medications  Medication Sig Dispense Refill  . allopurinol (ZYLOPRIM) 300 MG tablet TAKE 1 TABLET EVERY DAY 90 tablet 1  . ALOE PO Take 1 capsule by mouth 2 (two) times daily.    . ARIPiprazole (ABILIFY) 5 MG tablet Take 1 tablet (5 mg total) by mouth daily. 90 tablet 0  . aspirin 81 MG EC tablet Take 1 tablet (81 mg total) by mouth daily. 30 tablet 2  . carvedilol (COREG) 25 MG tablet Take 1 tablet (25 mg total) by mouth 2 (two) times daily with a meal. 180 tablet 3  .  diclofenac sodium (VOLTAREN) 1 % GEL Apply 2 g topically 4 (four) times daily. 1 Tube 0  . DULoxetine (CYMBALTA) 60 MG capsule Take 2 capsules (120 mg total) by mouth daily. 180 capsule 1  . ferrous sulfate 325 (65 FE) MG EC tablet Take 325 mg by mouth daily with breakfast.    . furosemide (LASIX) 80 MG tablet TAKE 1 TABLET TWICE DAILY 180 tablet 5  . liraglutide (VICTOZA) 18 MG/3ML SOPN Inject 0.3 mLs (1.8 mg total) into the skin daily. 3 pen 2  . metFORMIN (GLUCOPHAGE) 500 MG tablet Take 1 tablet (500 mg total) by mouth 2 (two) times daily with a meal. 180 tablet 0  . rosuvastatin (CRESTOR) 20 MG tablet TAKE 1 TABLET EVERY DAY 90 tablet 0  . spironolactone (ALDACTONE) 25 MG tablet Take 0.5 tablets (12.5 mg total) by mouth daily. 90 tablet 3  . Suvorexant (BELSOMRA) 10 MG TABS Take 10 mg by mouth at bedtime as needed (for sleep). 90 tablet 0  . traMADol (ULTRAM) 50 MG tablet TAKE 1-2 TABLETS BY MOUTH 2 TIMES DAILY AS NEEDED. 65 tablet 0  . Vitamin D, Ergocalciferol, (DRISDOL) 50000 units CAPS capsule Take 1 capsule (50,000 Units total) by mouth every 7 (seven) days. Take 1 capsule every 7 days, until the bottle is finished 8 capsule 0  . sacubitril-valsartan (ENTRESTO) 49-51 MG Take 1 tablet by mouth 2 (two) times daily. 60 tablet 11   No current facility-administered medications for this encounter.     No Known Allergies   Vitals:   04/30/18 0951  BP: 107/63  Pulse: 78  SpO2: 99%  Weight: 133 kg (293 lb 4 oz)   Wt Readings from Last 3 Encounters:  04/30/18 133 kg (293 lb 4 oz)  02/04/18 (!) 143 kg (315 lb 4.8 oz)  01/28/18 (!) 146.6 kg (323 lb 4.8 oz)     PHYSICAL EXAM: General: NAD, obese Neck: JVP 7-8 cm, no thyromegaly or thyroid nodule.  Lungs: Clear to auscultation bilaterally with normal respiratory effort. CV: Nondisplaced PMI.  Heart regular S1/S2, no S3/S4, no murmur.  No peripheral edema.  No carotid bruit.  Normal pedal pulses.  Abdomen: Soft, nontender, no  hepatosplenomegaly, no distention.  Skin: Intact without lesions or rashes.  Neurologic: Alert and oriented x 3.  Psych: Normal affect. Extremities: No clubbing or cyanosis.  HEENT: Normal.    ASSESSMENT & PLAN: 1. Chronic systolic CHF: Nonischemic cardiomyopathy.  Last echo 9/16 with EF 35-40%. Out of range for ICD. Interestingly, EF was reported as 52% on 6/17 Cardiolite (no ischemia or infarction).  NYHA II-III symptoms but very limited by back and knee pain so hard to tell how limited she truly is from cardiac perspective. She does not appear significantly volume overloaded on exam.  - Continue Lasix 80 mg  daily. BMET/BNP today.   - Continue spironolactone 12.5 mg daily.   - She can increase Entresto to 49/51 bid, repeat BMET 10 days.  - Continue Coreg 25 mg BID 2. OSA: Has not tolerated CPAP.   3. Obesity: Weight has come down some.  She gets very little exercise.   4. Hyperlipidemia: Continue Crestor.    - Check lipids today.  6. CKD: BMET today.  7. HTN: BP controlled.   Followup in 2 months.                                                               Loralie Champagne 05/01/2018

## 2018-05-02 ENCOUNTER — Telehealth (HOSPITAL_COMMUNITY): Payer: Self-pay | Admitting: *Deleted

## 2018-05-02 MED ORDER — FUROSEMIDE 40 MG PO TABS: 40.0000 mg | ORAL_TABLET | Freq: Every day | ORAL | 3 refills | Status: DC

## 2018-05-02 NOTE — Telephone Encounter (Signed)
Result Notes for Comprehensive Metabolic Panel (CMET)   Notes recorded by Darron Doom, RN on 05/02/2018 at 10:53 AM EDT Spoke with patient she will hold lasix tomorrow and then decrease to 40 mg daily starting Sunday. She's scared she will start retaining fluid, I advised her to check her wts every morning and if she gains 3 lbs overnight to call us. She's already scheduled for labs on 9/23. MAR updated. ------  Notes recorded by Larey Dresser, MD on 05/01/2018 at 9:38 PM EDT Hold Lasix 1 day, then decrease to 40 mg daily . Repeat BMET in 1 week.

## 2018-05-07 ENCOUNTER — Telehealth (HOSPITAL_COMMUNITY): Payer: Self-pay

## 2018-05-07 NOTE — Telephone Encounter (Signed)
Medication Samples have been provided to the patient.  Drug name: [name]       Strength: 49/51        Qty: 1  LOT: [id number]  Exp.Date: 05/5020  Dosing instructions: Take 1 tablet by mouth 2 (two) times daily.  The patient has been instructed regarding the correct time, dose, and frequency of taking this medication, including desired effects and most common side effects.   Vanice Sarah 12:05 PM 05/07/2018

## 2018-05-08 ENCOUNTER — Telehealth (HOSPITAL_COMMUNITY): Payer: Self-pay | Admitting: Pharmacist

## 2018-05-08 NOTE — Telephone Encounter (Signed)
[employer] patient assistance approved for Entresto 49-51 mg BID through 08/19/18.   Ruta Hinds. Velva Harman, PharmD, BCPS, CPP Clinical Pharmacist Phone: [phone] 05/08/2018 2:55 PM

## 2018-05-09 ENCOUNTER — Other Ambulatory Visit: Payer: Self-pay | Admitting: Internal Medicine

## 2018-05-09 DIAGNOSIS — I5042 Chronic combined systolic (congestive) and diastolic (congestive) heart failure: Secondary | ICD-10-CM

## 2018-05-09 NOTE — Progress Notes (Signed)
Received requests for prescription for stair lift. Prescription signed.

## 2018-05-12 ENCOUNTER — Ambulatory Visit (HOSPITAL_COMMUNITY)
Admission: RE | Admit: 2018-05-12 | Discharge: 2018-05-12 | Disposition: A | Discharge status: Home / Self Care | Payer: Medicare HMO | Source: Ambulatory Visit | Attending: Cardiology | Admitting: Cardiology

## 2018-05-12 ENCOUNTER — Ambulatory Visit (HOSPITAL_COMMUNITY)
Admission: RE | Admit: 2018-05-12 | Discharge: 2018-05-12 | Disposition: A | Discharge status: Home / Self Care | Payer: Medicare HMO | Source: Ambulatory Visit | Attending: Internal Medicine | Admitting: Internal Medicine

## 2018-05-12 DIAGNOSIS — E785 Hyperlipidemia, unspecified: Secondary | ICD-10-CM | POA: Insufficient documentation

## 2018-05-12 DIAGNOSIS — I5042 Chronic combined systolic (congestive) and diastolic (congestive) heart failure: Secondary | ICD-10-CM | POA: Diagnosis not present

## 2018-05-12 DIAGNOSIS — I130 Hypertensive heart and chronic kidney disease with heart failure and stage 1 through stage 4 chronic kidney disease, or unspecified chronic kidney disease: Secondary | ICD-10-CM | POA: Insufficient documentation

## 2018-05-12 DIAGNOSIS — N189 Chronic kidney disease, unspecified: Secondary | ICD-10-CM | POA: Insufficient documentation

## 2018-05-12 DIAGNOSIS — I361 Nonrheumatic tricuspid (valve) insufficiency: Secondary | ICD-10-CM

## 2018-05-12 LAB — BASIC METABOLIC PANEL
Anion gap: 10 (ref 5–15)
BUN: 14 mg/dL (ref 8–23)
CHLORIDE: 107 mmol/L (ref 98–111)
CO2: 24 mmol/L (ref 22–32)
CREATININE: 1.36 mg/dL — AB (ref 0.44–1.00)
Calcium: 8.7 mg/dL — ABNORMAL LOW (ref 8.9–10.3)
GFR calc Af Amer: 46 mL/min — ABNORMAL LOW (ref >60)
GFR calc non Af Amer: 40 mL/min — ABNORMAL LOW (ref >60)
GLUCOSE: 100 mg/dL — AB (ref 70–99)
Potassium: 3.9 mmol/L (ref 3.5–5.1)
SODIUM: 141 mmol/L (ref 135–145)

## 2018-05-13 ENCOUNTER — Ambulatory Visit (INDEPENDENT_AMBULATORY_CARE_PROVIDER_SITE_OTHER): Payer: Medicare HMO | Admitting: Internal Medicine

## 2018-05-13 ENCOUNTER — Other Ambulatory Visit: Payer: Self-pay

## 2018-05-13 ENCOUNTER — Encounter: Payer: Self-pay | Admitting: Internal Medicine

## 2018-05-13 VITALS — BP 143/94 | HR 72 | Temp 99.2°F | Wt 300.7 lb

## 2018-05-13 DIAGNOSIS — F332 Major depressive disorder, recurrent severe without psychotic features: Secondary | ICD-10-CM

## 2018-05-13 DIAGNOSIS — R5383 Other fatigue: Secondary | ICD-10-CM | POA: Diagnosis not present

## 2018-05-13 DIAGNOSIS — E669 Obesity, unspecified: Secondary | ICD-10-CM

## 2018-05-13 DIAGNOSIS — F339 Major depressive disorder, recurrent, unspecified: Secondary | ICD-10-CM | POA: Diagnosis not present

## 2018-05-13 DIAGNOSIS — I5042 Chronic combined systolic (congestive) and diastolic (congestive) heart failure: Secondary | ICD-10-CM

## 2018-05-13 DIAGNOSIS — G4700 Insomnia, unspecified: Secondary | ICD-10-CM | POA: Diagnosis not present

## 2018-05-13 DIAGNOSIS — Z6841 Body Mass Index (BMI) 40.0 and over, adult: Secondary | ICD-10-CM

## 2018-05-13 DIAGNOSIS — N182 Chronic kidney disease, stage 2 (mild): Secondary | ICD-10-CM | POA: Diagnosis not present

## 2018-05-13 DIAGNOSIS — D509 Iron deficiency anemia, unspecified: Secondary | ICD-10-CM | POA: Diagnosis not present

## 2018-05-13 DIAGNOSIS — Z0000 Encounter for general adult medical examination without abnormal findings: Secondary | ICD-10-CM | POA: Diagnosis not present

## 2018-05-13 DIAGNOSIS — M25562 Pain in left knee: Secondary | ICD-10-CM

## 2018-05-13 DIAGNOSIS — Z79891 Long term (current) use of opiate analgesic: Secondary | ICD-10-CM

## 2018-05-13 DIAGNOSIS — M109 Gout, unspecified: Secondary | ICD-10-CM

## 2018-05-13 DIAGNOSIS — D649 Anemia, unspecified: Secondary | ICD-10-CM | POA: Diagnosis not present

## 2018-05-13 DIAGNOSIS — Z23 Encounter for immunization: Secondary | ICD-10-CM

## 2018-05-13 DIAGNOSIS — G4733 Obstructive sleep apnea (adult) (pediatric): Secondary | ICD-10-CM

## 2018-05-13 DIAGNOSIS — I10 Essential (primary) hypertension: Secondary | ICD-10-CM

## 2018-05-13 DIAGNOSIS — M25561 Pain in right knee: Secondary | ICD-10-CM

## 2018-05-13 DIAGNOSIS — I130 Hypertensive heart and chronic kidney disease with heart failure and stage 1 through stage 4 chronic kidney disease, or unspecified chronic kidney disease: Secondary | ICD-10-CM | POA: Diagnosis not present

## 2018-05-13 DIAGNOSIS — D72819 Decreased white blood cell count, unspecified: Secondary | ICD-10-CM | POA: Diagnosis not present

## 2018-05-13 DIAGNOSIS — Z79899 Other long term (current) drug therapy: Secondary | ICD-10-CM

## 2018-05-13 DIAGNOSIS — M549 Dorsalgia, unspecified: Secondary | ICD-10-CM

## 2018-05-13 DIAGNOSIS — G8929 Other chronic pain: Secondary | ICD-10-CM

## 2018-05-13 DIAGNOSIS — Z636 Dependent relative needing care at home: Secondary | ICD-10-CM

## 2018-05-13 DIAGNOSIS — E785 Hyperlipidemia, unspecified: Secondary | ICD-10-CM | POA: Diagnosis not present

## 2018-05-13 DIAGNOSIS — Z1231 Encounter for screening mammogram for malignant neoplasm of breast: Secondary | ICD-10-CM

## 2018-05-13 MED ORDER — ROSUVASTATIN CALCIUM 20 MG PO TABS: 20.0000 mg | ORAL_TABLET | Freq: Every day | ORAL | 1 refills | Status: DC

## 2018-05-13 MED ORDER — TRAMADOL HCL 50 MG PO TABS: 50.0000 mg | ORAL_TABLET | Freq: Two times a day (BID) | ORAL | 0 refills | Status: AC | PRN

## 2018-05-13 MED ORDER — ALLOPURINOL 300 MG PO TABS: 300.0000 mg | ORAL_TABLET | Freq: Every day | ORAL | 1 refills | Status: DC

## 2018-05-13 NOTE — Assessment & Plan Note (Signed)
BP Readings from Last 3 Encounters:  05/13/18 (!) 143/94  04/30/18 107/63  02/04/18 118/74   Above goal this visit. Follows with Dr.McLean at HF clinic closely. Recent Bps at other office visits were within goal. Recent increase in Entresto dose  - C/w current regimen: Coreg 56m Bid, Spironolactone 12.580m Entresto 49/51 bid

## 2018-05-13 NOTE — Patient Instructions (Addendum)
Robin Hardin came to [place] with complaints of fatigue.  We will do some blood tests to see if there are other causes of fatigue.  Most likely this is due to your depression and we strongly encourage that you meet up with your behavioral health specialist.  We are also sending in some orders so that he can get checked for breast cancer.  And would like to remind you to make sure you follow-up with the stool sample for colon cancer screening.  Thank you for coming to the clinic.

## 2018-05-13 NOTE — Assessment & Plan Note (Addendum)
CBC    Component Value Date/Time   WBC 4.1 04/30/2018 1008   RBC 3.87 04/30/2018 1008   HGB 11.3 (L) 04/30/2018 1008   HGB 12.5 03/20/2017 0000   HCT 37.4 04/30/2018 1008   HCT 37.9 03/20/2017 0000   PLT 201 04/30/2018 1008   PLT 212 03/20/2017 0000   MCV 96.6 04/30/2018 1008   MCV 90 03/20/2017 0000   MCH 29.2 04/30/2018 1008   MCHC 30.2 04/30/2018 1008   RDW 14.4 04/30/2018 1008   RDW 13.9 03/20/2017 0000   LYMPHSABS 1.3 03/20/2017 0000   MONOABS 0.3 12/28/2014 1507   EOSABS 0.1 03/20/2017 0000   BASOSABS 0.0 03/20/2017 0000   Presents with fatigue and weakness.  Has history of iron deficiency anemia on iron supplements.  Recent CBC show normocytic anemia however MCV is close to macrocytic at 97.  Her presentation of fatigue and weakness is likely due to depression. However will rule out symptomatic anemia due to B12/Folate deficiency as well as thyroid disorder.  - MMA - B12 - Folate - CBC - TSH

## 2018-05-13 NOTE — Assessment & Plan Note (Signed)
Mammogram order put in

## 2018-05-13 NOTE — Assessment & Plan Note (Signed)
She has stool cards ready but she has not sent it in yet reminded patient to send it in when ready.  Re-ordered mammogram as she is overdue.  She agrees to flu shot today.

## 2018-05-13 NOTE — Assessment & Plan Note (Addendum)
Presenting with complaints of fatigue and weakness.  Appears to have worsened by recent social stressors including worsening of her husband's health.  Complicated by caretaker fatigue.  Currently on Abilify and duloxetine.  Has an upcoming appointment with Dr. Daron Offer at the end of October/November.  PHQ-9 this visit: 17  - C/w Abilify 47m daily - C/w Duloxetine 1221mdaily - Recommend close f/u w/ psychiatry

## 2018-05-13 NOTE — Assessment & Plan Note (Signed)
Lipid Panel     Component Value Date/Time   CHOL 166 04/30/2018 1008   TRIG 107 04/30/2018 1008   HDL 40 (L) 04/30/2018 1008   CHOLHDL 4.2 04/30/2018 1008   VLDL 21 04/30/2018 1008   LDLCALC 105 (H) 04/30/2018 1008   LDLDIRECT 160.6 06/14/2009 0835   Started on Crestor by her cardiologist.  Refilled as requested.  - C/w rosuvastatin 5m daily

## 2018-05-13 NOTE — Assessment & Plan Note (Addendum)
Indication for chronic opioid: Chronic back and knee pains Medication and dose: Tramadol 64m q12hr PRN # pills per month: 60 Last UDS date: 2/18 Opioid Treatment Agreement signed (Y/N): Y Opioid Treatment Agreement last reviewed with patient:  05/13/18 NCCSRS reviewed this encounter (include red flags):  Y receiving from single source ([facility] at regular periods. no red flags  Has been taking chronic opioid meds since 2017. Since meeting patient for first time today, discussed plans to continue current regimen but decrease in future. Will refill month's supply. Discussed getting UDS next visit.

## 2018-05-13 NOTE — Progress Notes (Signed)
CC: Fatigue  HPI: [name] is a 67 y.o. w/ PMH of HTN, combined HF, OSA, CKD, Depression, Obesity and gout who presents to the clinic with complaints of fatigue.  She states that since her husband's recent MI requiring CABG he has been very busy taking care of him.  She states that she has to wake up early prepare meals and get him ready for dialysis.  States that although she would rather stay in bed all day, she forces herself to get up and do her tasks for the sake of her husband.  She states that everything requires a lot of effort on her part.  Usually she feels down and depressed.  She states when her husband is a dialysis she wants to stay in bed all day. Denies any homicidal/suicidal ideations. States have difficulty going to bed at night for sleep but it has been managed well recently with melatonin. Denis any Audio,visual,tactile hallucinations. PHQ9 at this visit is 17.  Past Medical History:  Diagnosis Date  . Acute respiratory failure with hypoxia ([place]) 09/26/2012  . Bilateral knee pain   . Cellulitis of lower leg    left  . Chronic combined systolic and diastolic congestive heart failure ([place]) 10/10/2012   2 D echo 09/2012:  Left ventricle: Extremely poor acoustic windows limit study. Overall LVEF appears to be severely depressed. Recomm ordering limited study with contrast to further evaluate LV systolic function. The cavity size was normal. Wall thickness was normal. Doppler parameters are consistent with abnormal left ventricular relaxation (grade 1 diastolic dysfunction).   2 D echo 03/2009 :  Left ventricle: The cavity size was normal. There was mild concentric hypertrophy. Systolic function was mildly reduced. The estimated ejection fraction was in the range of 45% to 50%. Diffuse hypokinesis. Doppler parameters are consistent with abnormal left ventricular relaxation (grade 1 diastolic dysfunction).     . Chronic kidney disease 10/10/2012   Stage 2 with GFR of 64   .  Depression   . Gout   . H/O: hysterectomy   . Hyperlipidemia   . Hypertension   . Malignant hypertension 09/26/2012  . Morbid obesity ([place])   . Nonischemic cardiomyopathy ([place])   . Sleep apnea    on cpap  . Unspecified essential hypertension 03/22/2009   Qualifier: Diagnosis of  By: Karrie Meres, RN, BSN, [name]     Review of Systems: Review of Systems  Constitutional: Positive for malaise/fatigue. Negative for chills, diaphoresis, fever and weight loss.  Respiratory: Negative for hemoptysis, shortness of breath and wheezing.   Cardiovascular: Positive for leg swelling. Negative for chest pain, palpitations and orthopnea.  Gastrointestinal: Negative for constipation, diarrhea, nausea and vomiting.  Musculoskeletal: Positive for back pain and joint pain. Negative for falls and myalgias.  Neurological: Positive for weakness. Negative for dizziness, tingling and headaches.  Psychiatric/Behavioral: Positive for depression. Negative for hallucinations, memory loss and suicidal ideas. The patient has insomnia. The patient is not nervous/anxious.     Physical Exam: Vitals:   05/13/18 1414  BP: (!) 143/94  Pulse: 72  Temp: 99.2 F (37.3 C)  TempSrc: Oral  SpO2: 97%  Weight: (!) 300 lb 11.2 oz (136.4 kg)   Physical Exam  Constitutional: She is oriented to person, place, and time. She appears well-developed and well-nourished. No distress.  Obese woman in wheelchair  HENT:  Head: Normocephalic and atraumatic.  Mouth/Throat: Oropharynx is clear and moist.  Eyes: Pupils are equal, round, and reactive to light. Conjunctivae and EOM are normal.  No scleral icterus.  Neck: Normal range of motion. Neck supple.  Cardiovascular: Normal rate, regular rhythm, normal heart sounds and intact distal pulses.  Respiratory: Effort normal and breath sounds normal. She has no rales.  GI: Soft. Bowel sounds are normal. She exhibits no distension. There is no tenderness. There is no guarding.  Musculoskeletal:  Normal range of motion. She exhibits edema (trace edema around ankles). She exhibits no tenderness.  Neurological: She is alert and oriented to person, place, and time. She has normal reflexes.  Skin: Skin is warm and dry.  Psychiatric: Her behavior is normal. Judgment and thought content normal.  Flat affect     Assessment & Plan:   Depression Presenting with complaints of fatigue and weakness.  Appears to have worsened by recent social stressors including worsening of her husband's health.  Complicated by caretaker fatigue.  Currently on Abilify and duloxetine.  Has an upcoming appointment with Dr. Daron Offer at the end of October/November.  PHQ-9 this visit: 17  - C/w Abilify 55m daily - C/w Duloxetine 1255mdaily - Recommend close f/u w/ psychiatry  Essential hypertension BP Readings from Last 3 Encounters:  05/13/18 (!) 143/94  04/30/18 107/63  02/04/18 118/74   Above goal this visit. Follows with Dr.McLean at HF clinic closely. Recent Bps at other office visits were within goal. Recent increase in Entresto dose  - C/w current regimen: Coreg 2573mid, Spironolactone 12.5mg3mntresto 49/51 bid  Long-term current use of opiate analgesic Indication for chronic opioid: Chronic back and knee pains Medication and dose: Tramadol 50mg47mhr PRN # pills per month: 60 Last UDS date: 2/18 Opioid Treatment Agreement signed (Y/N): Y Opioid Treatment Agreement last reviewed with patient:  05/13/18 NCCSRS reviewed this encounter (include red flags):  [facility]  Has been taking chronic opioid meds since 2017. Since meeting patient for first time today, discussed plans to continue current regimen but decrease in future. Will refill month's supply. Discussed getting UDS next visit.  Health care maintenance She has stool cards ready but she has not sent it in yet reminded patient to send it in when ready.  Re-ordered mammogram as she is  overdue.  She agrees to flu shot today.  Hyperlipidemia Lipid Panel     Component Value Date/Time   CHOL 166 04/30/2018 1008   TRIG 107 04/30/2018 1008   HDL 40 (L) 04/30/2018 1008   CHOLHDL 4.2 04/30/2018 1008   VLDL 21 04/30/2018 1008   LDLCALC 105 (H) 04/30/2018 1008   LDLDIRECT 160.6 06/14/2009 0835   Started on Crestor by her cardiologist.  Refilled as requested.  - C/w rosuvastatin 20mg 34my   Breast cancer screening by mammogram Mammogram order put in  Normocytic anemia CBC    Component Value Date/Time   WBC 4.1 04/30/2018 1008   RBC 3.87 04/30/2018 1008   HGB 11.3 (L) 04/30/2018 1008   HGB 12.5 03/20/2017 0000   HCT 37.4 04/30/2018 1008   HCT 37.9 03/20/2017 0000   PLT 201 04/30/2018 1008   PLT 212 03/20/2017 0000   MCV 96.6 04/30/2018 1008   MCV 90 03/20/2017 0000   MCH 29.2 04/30/2018 1008   MCHC 30.2 04/30/2018 1008   RDW 14.4 04/30/2018 1008   RDW 13.9 03/20/2017 0000   LYMPHSABS 1.3 03/20/2017 0000   MONOABS 0.3 12/28/2014 1507   EOSABS 0.1 03/20/2017 0000   BASOSABS 0.0 03/20/2017 0000   Presents with fatigue and weakness.  Has history of iron deficiency anemia on iron supplements.  Recent CBC show normocytic anemia however MCV is close to macrocytic at 97.  Her presentation of fatigue and weakness is likely due to depression. However will rule out symptomatic anemia due to B12/Folate deficiency as well as thyroid disorder.  - MMA - B12 - Folate - CBC - TSH     Patient seen with Dr. Rebeca Alert   -Gilberto Better, PGY1

## 2018-05-14 LAB — CBC
Hematocrit: 34.8 % (ref 34.0–46.6)
Hemoglobin: 11.2 g/dL (ref 11.1–15.9)
MCH: 29 pg (ref 26.6–33.0)
MCHC: 32.2 g/dL (ref 31.5–35.7)
MCV: 90 fL (ref 79–97)
Platelets: 242 10*3/uL (ref 150–450)
RBC: 3.86 x10E6/uL (ref 3.77–5.28)
RDW: 15.4 % (ref 12.3–15.4)
WBC: 2.8 10*3/uL — ABNORMAL LOW (ref 3.4–10.8)

## 2018-05-14 LAB — FOLATE RBC
Folate, Hemolysate: 518.1 ng/mL
Folate, RBC: 1489 ng/mL (ref >498)

## 2018-05-16 ENCOUNTER — Telehealth: Payer: Self-pay | Admitting: *Deleted

## 2018-05-16 LAB — TSH: TSH: 3.17 u[IU]/mL (ref 0.450–4.500)

## 2018-05-16 LAB — HEPATITIS C ANTIBODY: Hep C Virus Ab: <0.1 s/co ratio (ref 0.0–0.9)

## 2018-05-16 LAB — VITAMIN B12: Vitamin B-12: 1093 pg/mL (ref 232–1245)

## 2018-05-16 LAB — HIV ANTIBODY (ROUTINE TESTING W REFLEX): HIV Screen 4th Generation wRfx: NONREACTIVE

## 2018-05-16 LAB — METHYLMALONIC ACID, SERUM: Methylmalonic Acid: 285 nmol/L (ref 0–378)

## 2018-05-16 NOTE — Telephone Encounter (Signed)
Pt called and stated she was to pick up a script for tramadol at the desk. States tramadol, called [facility] and gave VO for tramadol. Please send controlled substances electronically

## 2018-05-16 NOTE — Progress Notes (Signed)
Internal Medicine Clinic Attending  I saw and evaluated the patient.  I personally confirmed the key portions of the history and exam documented by Dr. Truman Hayward and I reviewed pertinent patient test results.  The assessment, diagnosis, and plan were formulated together and I agree with the documentation in the resident's note.  Having significant worsening of her depression likely related to her husband's illness and her caregiving responsibilities. Lab workup negative for etiology of fatigue. Followed by psychiatry, she should call in advance of upcoming appointment to discuss medication adjustment.   Incidental note of leukopenia on CBC, will need to evaluate with differential. Remainder of cell lines stable with mild anemia (normocytic).   Lenice Pressman, M.D., Ph.D.

## 2018-05-18 NOTE — Telephone Encounter (Signed)
We unfortunately still have not been set up for fingerprint script but once that is completed, we will be able to do taht electronically... I did leave a script at the desk but guess she this works too.

## 2018-05-27 ENCOUNTER — Telehealth (HOSPITAL_COMMUNITY): Payer: Self-pay | Admitting: Pharmacist

## 2018-05-27 NOTE — Telephone Encounter (Signed)
Ms. Pitcher called stating that she is out of Entresto. She is now enrolled in the [employer] patient assistance program so I have asked her to call them to ship it to her house ASAP [phone]). Patient is agreeable and grateful for the assistance.   Ruta Hinds. Velva Harman, PharmD, BCPS, CPP Clinical Pharmacist Phone: [phone] 05/27/2018 2:40 PM

## 2018-06-05 DIAGNOSIS — G4737 Central sleep apnea in conditions classified elsewhere: Secondary | ICD-10-CM | POA: Diagnosis not present

## 2018-06-05 DIAGNOSIS — G4733 Obstructive sleep apnea (adult) (pediatric): Secondary | ICD-10-CM | POA: Diagnosis not present

## 2018-06-05 DIAGNOSIS — J9690 Respiratory failure, unspecified, unspecified whether with hypoxia or hypercapnia: Secondary | ICD-10-CM | POA: Diagnosis not present

## 2018-06-05 DIAGNOSIS — M1990 Unspecified osteoarthritis, unspecified site: Secondary | ICD-10-CM | POA: Diagnosis not present

## 2018-06-12 ENCOUNTER — Ambulatory Visit (INDEPENDENT_AMBULATORY_CARE_PROVIDER_SITE_OTHER): Payer: Medicare HMO | Admitting: Psychiatry

## 2018-06-12 ENCOUNTER — Encounter (HOSPITAL_COMMUNITY): Payer: Self-pay | Admitting: Psychiatry

## 2018-06-12 VITALS — BP 145/82 | HR 64 | Ht 63.0 in | Wt 297.0 lb

## 2018-06-12 DIAGNOSIS — F330 Major depressive disorder, recurrent, mild: Secondary | ICD-10-CM | POA: Diagnosis not present

## 2018-06-12 DIAGNOSIS — R4189 Other symptoms and signs involving cognitive functions and awareness: Secondary | ICD-10-CM | POA: Diagnosis not present

## 2018-06-12 DIAGNOSIS — F5101 Primary insomnia: Secondary | ICD-10-CM

## 2018-06-12 MED ORDER — ARIPIPRAZOLE 5 MG PO TABS: 5.0000 mg | ORAL_TABLET | Freq: Every day | ORAL | 0 refills | Status: DC

## 2018-06-12 MED ORDER — DULOXETINE HCL 60 MG PO CPEP: 120.0000 mg | ORAL_CAPSULE | Freq: Every day | ORAL | 0 refills | Status: DC

## 2018-06-12 MED ORDER — TRAZODONE HCL 50 MG PO TABS: 50.0000 mg | ORAL_TABLET | Freq: Every day | ORAL | 0 refills | Status: DC

## 2018-06-12 NOTE — Progress Notes (Signed)
[facility] MD/PA/NP OP Progress Note  06/12/2018 2:11 PM Robin Hardin  MRN:  [id number]  Chief Complaint: Today I am earlier than my schedule appointment.  I need my medication.  HPI: Robin Hardin is a 67 year old African-American female who is been seeing Dr. Daron Offer since February 2018.  Patient has a history of depression and anxiety.  She also had memory problem.  She is taking Abilify and Cymbalta.  She continued to endorse chronic symptoms of depression, forgetfulness and anxiety.  However she feels Abilify working much better than previous medication.  She has no tremors, shakes or any EPS.  She is taking care of her husband who has multiple health issues and he goes to dialysis 3 times a week.  Patient gets some time overwhelmed because she has to cook and take care of everything in the house.  She has chronic problem of forgetfulness, decreased attention, concentration.  She admitted some time difficult to find places to go and she mess at the direction.  She was recommended to see neurology but apparently appointment was canceled.  She recently seen her primary care physician and she had blood work.  I saw her electronic medical record as she is no longer taking Belsomra.  She is not sure why she is not taking but endorsed there is a time when she took melatonin but she does not feel it works as good.  I review her chart she used to take trazodone with good response.  She is willing to go back on trazodone.  Patient denies any agitation, anger, mania, psychosis.  She denies any hallucination or any suicidal thoughts.  She has some support system consist of family members who live close by.  She is not interested in counseling.  She is overweight and prescribed metformin.  She has lost weight since taking the metformin.  Patient denies drinking or using any illegal substances.  Visit Diagnosis:    ICD-10-CM   1. 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 Primary insomnia F51.01 traZODone (DESYREL) 50 MG tablet    Past Psychiatric History: Reviewed and updated. Patient denies any history of psychiatric inpatient treatment or any suicidal attempt.  In the past she has taken Prozac which was switched but it stopped working, Wellbutrin did not work, Remeron make her weight gain, amitriptyline caused weight gain, REXULTI did not work, Celexa did not work and Ambien stopped working.  She was also recommended for [place] but her insurance did not approve.  Patient denies any mania or psychosis.  Past Medical History:  Past Medical History:  Diagnosis Date  . Acute respiratory failure with hypoxia ([place]) 09/26/2012  . Bilateral knee pain   . Cellulitis of lower leg    left  . Chronic combined systolic and diastolic congestive heart failure ([place]) 10/10/2012   2 D echo 09/2012:  Left ventricle: Extremely poor acoustic windows limit study. Overall LVEF appears to be severely depressed. Recomm ordering limited study with contrast to further evaluate LV systolic function. The cavity size was normal. Wall thickness was normal. Doppler parameters are consistent with abnormal left ventricular relaxation (grade 1 diastolic dysfunction).   2 D echo 03/2009 :  Left ventricle: The cavity size was normal. There was mild concentric hypertrophy. Systolic function was mildly reduced. The estimated ejection fraction was in the range of 45%  to 50%. Diffuse hypokinesis. Doppler parameters are consistent with abnormal left ventricular relaxation (grade 1 diastolic dysfunction).     . Chronic kidney disease 10/10/2012   Stage 2 with GFR of 64   . Depression   . Gout   . H/O: hysterectomy   . Hyperlipidemia   . Hypertension   . Malignant hypertension 09/26/2012  . Morbid obesity ([place])   . Nonischemic cardiomyopathy ([place])   . Sleep apnea    on cpap  .  Unspecified essential hypertension 03/22/2009   Qualifier: Diagnosis of  By: Karrie Meres, RN, BSN, [name]     No past surgical history on file.  Family Psychiatric History: Reviewed  Family History:  Family History  Problem Relation Age of Onset  . Asthma Mother   . Heart disease Mother   . Stroke Mother        clotting disorders  . Asthma Brother   . Asthma Brother     Social History:  Social History   Socioeconomic History  . Marital status: Married    Spouse name: Not on file  . Number of children: 2  . Years of education: Not on file  . Highest education level: Not on file  Occupational History  . Occupation: retired Forensic psychologist: UNEMPLOYED  Social Needs  . Financial resource strain: Not hard at all  . Food insecurity:    Worry: Never true    Inability: Never true  . Transportation needs:    Medical: No    Non-medical: No  Tobacco Use  . Smoking status: Never Smoker  . Smokeless tobacco: Never Used  Substance and Sexual Activity  . Alcohol use: No    Alcohol/week: 0.0 standard drinks  . Drug use: No  . Sexual activity: Not Currently    Partners: Male  Lifestyle  . Physical activity:    Days per week: 0 days    Minutes per session: 0 min  . Stress: Rather much  Relationships  . Social connections:    Talks on phone: Once a week    Gets together: Once a week    Attends religious service: More than 4 times per year    Active member of club or organization: No    Attends meetings of clubs or organizations: Never    Relationship status: Married  Other Topics Concern  . Not on file  Social History Narrative  . Not on file    Allergies: No Known Allergies  Metabolic Disorder Labs: Recent Results (from the past 2160 hour(s))  Comprehensive Metabolic Panel (CMET)     Status: Abnormal   Collection Time: 04/30/18 10:08 AM  Result Value Ref Range   Sodium 143 135 - 145 mmol/L   Potassium 3.1 (L) 3.5 - 5.1 mmol/L   Chloride 99 98 - 111 mmol/L   CO2 29 22  - 32 mmol/L   Glucose, Bld 127 (H) 70 - 99 mg/dL   BUN 20 8 - 23 mg/dL   Creatinine, Ser 1.78 (H) 0.44 - 1.00 mg/dL   Calcium 9.5 8.9 - 10.3 mg/dL   Total Protein 6.6 6.5 - 8.1 g/dL   Albumin 3.7 3.5 - 5.0 g/dL   AST 38 15 - 41 U/L   ALT 27 0 - 44 U/L   Alkaline Phosphatase 82 38 - 126 U/L   Total Bilirubin 0.5 0.3 - 1.2 mg/dL   GFR calc non Af Amer 29 (L) >60 mL/min   GFR calc Af Amer 33 (L) >60 mL/min  Comment: (NOTE) The eGFR has been calculated using the CKD EPI equation. This calculation has not been validated in all clinical situations. eGFR's persistently <60 mL/min signify possible Chronic Kidney Disease.    Anion gap 15 5 - 15    Comment: Performed at [facility] [address], [place], [place] [address]  CBC     Status: Abnormal   Collection Time: 04/30/18 10:08 AM  Result Value Ref Range   WBC 4.1 4.0 - 10.5 K/uL   RBC 3.87 3.87 - 5.11 MIL/uL   Hemoglobin 11.3 (L) 12.0 - 15.0 g/dL   HCT 37.4 36.0 - 46.0 %   MCV 96.6 78.0 - 100.0 fL   MCH 29.2 26.0 - 34.0 pg   MCHC 30.2 30.0 - 36.0 g/dL   RDW 14.4 11.5 - 15.5 %   Platelets 201 150 - 400 K/uL    Comment: Performed at [facility], [place] [address] [place], [place] [address]  Lipid panel     Status: Abnormal   Collection Time: 04/30/18 10:08 AM  Result Value Ref Range   Cholesterol 166 0 - 200 mg/dL   Triglycerides 107 <150 mg/dL   HDL 40 (L) >40 mg/dL   Total CHOL/HDL Ratio 4.2 RATIO   VLDL 21 0 - 40 mg/dL   LDL Cholesterol 105 (H) 0 - 99 mg/dL    Comment:        Total Cholesterol/HDL:CHD Risk Coronary Heart Disease Risk Table                     Men   Women  1/2 Average Risk   3.4   3.3  Average Risk       5.0   4.4  2 X Average Risk   9.6   7.1  3 X Average Risk  23.4   11.0        Use the calculated Patient Ratio above and the CHD Risk Table to determine the patient's CHD Risk.        ATP III CLASSIFICATION (LDL):  <100     mg/dL   Optimal  100-129  mg/dL   Near or Above                     Optimal  130-159  mg/dL   Borderline  160-189  mg/dL   High  >190     mg/dL   Very High Performed at [facility] [address], [place], [place] [address]   Basic metabolic panel     Status: Abnormal   Collection Time: 05/12/18 12:54 PM  Result Value Ref Range   Sodium 141 135 - 145 mmol/L   Potassium 3.9 3.5 - 5.1 mmol/L   Chloride 107 98 - 111 mmol/L   CO2 24 22 - 32 mmol/L   Glucose, Bld 100 (H) 70 - 99 mg/dL   BUN 14 8 - 23 mg/dL   Creatinine, Ser 1.36 (H) 0.44 - 1.00 mg/dL   Calcium 8.7 (L) 8.9 - 10.3 mg/dL   GFR calc non Af Amer 40 (L) >60 mL/min   GFR calc Af Amer 46 (L) >60 mL/min    Comment: (NOTE) The eGFR has been calculated using the CKD EPI equation. This calculation has not been validated in all clinical situations. eGFR's persistently <60 mL/min signify possible Chronic Kidney Disease.    Anion gap 10 5 - 15    Comment: Performed at [facility],  [address] [place] [address]  CBC no Diff     Status: Abnormal   Collection Time: 05/13/18  3:36 PM  Result Value Ref Range   WBC 2.8 (L) 3.4 - 10.8 x10E3/uL   RBC 3.86 3.77 - 5.28 x10E6/uL   Hemoglobin 11.2 11.1 - 15.9 g/dL   Hematocrit 34.8 34.0 - 46.6 %   MCV 90 79 - 97 fL   MCH 29.0 26.6 - 33.0 pg   MCHC 32.2 31.5 - 35.7 g/dL   RDW 15.4 12.3 - 15.4 %   Platelets 242 150 - 450 x10E3/uL  TSH     Status: None   Collection Time: 05/13/18  3:36 PM  Result Value Ref Range   TSH 3.170 0.450 - 4.500 uIU/mL  Vitamin B12     Status: None   Collection Time: 05/13/18  3:36 PM  Result Value Ref Range   Vitamin B-12 1,093 232 - 1,245 pg/mL  Methylmalonic Acid     Status: None   Collection Time: 05/13/18  3:36 PM  Result Value Ref Range   Methylmalonic Acid 285 0 - 378 nmol/L   Disclaimer: Comment     Comment: This test was developed and its performance characteristics determined by [facility]. It has not been cleared or approved by the Food and Drug Administration.   RBC Folate      Status: None   Collection Time: 05/13/18  3:36 PM  Result Value Ref Range   Folate, Hemolysate 518.1 Not Estab. ng/mL   Folate, RBC 1,489 >498 ng/mL  Hepatitis C antibody     Status: None   Collection Time: 05/13/18  3:36 PM  Result Value Ref Range   Hep C Virus Ab <0.1 0.0 - 0.9 s/co ratio    Comment:                                   Negative:     < 0.8                              Indeterminate: 0.8 - 0.9                                   Positive:     > 0.9  The CDC recommends that a positive HCV antibody result  be followed up with a HCV Nucleic Acid Amplification  test (030131).   HIV antibody (with reflex)     Status: None   Collection Time: 05/13/18  3:36 PM  Result Value Ref Range   HIV Screen 4th Generation wRfx Non Reactive Non Reactive   Lab Results  Component Value Date   HGBA1C 5.5 11/08/2015   MPG 123 (H) 09/26/2012   MPG 148 (H) 05/26/2010   No results found for: PROLACTIN Lab Results  Component Value Date   CHOL 166 04/30/2018   TRIG 107 04/30/2018   HDL 40 (L) 04/30/2018   CHOLHDL 4.2 04/30/2018   VLDL 21 04/30/2018   LDLCALC 105 (H) 04/30/2018   LDLCALC 122 (H) 06/11/2016   Lab Results  Component Value Date   TSH 3.170 05/13/2018   TSH 1.030 03/20/2017    Therapeutic Level Labs: No results found for: LITHIUM No results found for: VALPROATE No components found for:  CBMZ  Current Medications: Current Outpatient  Medications  Medication Sig Dispense Refill  . allopurinol (ZYLOPRIM) 300 MG tablet Take 1 tablet (300 mg total) by mouth daily. 90 tablet 1  . ALOE PO Take 1 capsule by mouth 2 (two) times daily.    . ARIPiprazole (ABILIFY) 5 MG tablet Take 1 tablet (5 mg total) by mouth daily. 90 tablet 0  . aspirin 81 MG EC tablet Take 1 tablet (81 mg total) by mouth daily. 30 tablet 2  . carvedilol (COREG) 25 MG tablet Take 1 tablet (25 mg total) by mouth 2 (two) times daily with a meal. 180 tablet 3  . DULoxetine (CYMBALTA) 60 MG capsule Take 2  capsules (120 mg total) by mouth daily. 180 capsule 1  . furosemide (LASIX) 40 MG tablet Take 1 tablet (40 mg total) by mouth daily. 30 tablet 3  . metFORMIN (GLUCOPHAGE) 500 MG tablet Take 1 tablet (500 mg total) by mouth 2 (two) times daily with a meal. 180 tablet 0  . rosuvastatin (CRESTOR) 20 MG tablet Take 1 tablet (20 mg total) by mouth daily. 90 tablet 1  . sacubitril-valsartan (ENTRESTO) 49-51 MG Take 1 tablet by mouth 2 (two) times daily. 60 tablet 11  . spironolactone (ALDACTONE) 25 MG tablet Take 0.5 tablets (12.5 mg total) by mouth daily. 90 tablet 3  . traMADol (ULTRAM) 50 MG tablet Take 1 tablet (50 mg total) by mouth every 12 (twelve) hours as needed for moderate pain. 60 tablet 0  . diclofenac sodium (VOLTAREN) 1 % GEL Apply 2 g topically 4 (four) times daily. (Patient not taking: Reported on 06/12/2018) 1 Tube 0  . liraglutide (VICTOZA) 18 MG/3ML SOPN Inject 0.3 mLs (1.8 mg total) into the skin daily. (Patient not taking: Reported on 06/12/2018) 3 pen 2  . Vitamin D, Ergocalciferol, (DRISDOL) 50000 units CAPS capsule Take 1 capsule (50,000 Units total) by mouth every 7 (seven) days. Take 1 capsule every 7 days, until the bottle is finished (Patient not taking: Reported on 06/12/2018) 8 capsule 0   No current facility-administered medications for this visit.      Musculoskeletal: Strength & Muscle Tone: within normal limits Gait & Station: normal Patient leans: N/A  Psychiatric Specialty Exam: Review of Systems  HENT: Negative.   Skin: Negative.   Psychiatric/Behavioral: Positive for memory loss. The patient is nervous/anxious and has insomnia.     Blood pressure (!) 145/82, pulse 64, height _0  (1.6 m), weight 297 lb (134.7 kg), SpO2 97 %.Body mass index is 52.61 kg/m.  General Appearance: Casual and Fairly Groomed  Eye Contact:  Fair  Speech:  Clear and Coherent  Volume:  Normal  Mood:  Anxious  Affect:  Congruent  Thought Process:  Descriptions of Associations:  Intact  Orientation:  Full (Time, Place, and Person)  Thought Content: Rumination   Suicidal Thoughts:  No  Homicidal Thoughts:  No  Memory:  Immediate;   Fair Recent;   Poor Remote;   Fair  Judgement:  Fair  Insight:  Fair  Psychomotor Activity:  Normal  Concentration:  Concentration: Poor and Attention Span: Fair  Recall:  [employer] of Knowledge: Fair  Language: Fair  Akathisia:  No  Handed:  Right  AIMS (if indicated): not done  Assets:  Communication Skills Desire for Improvement Housing  ADL's:  Intact  Cognition: Impaired,  Mild  Sleep:  Fair   Screenings: PHQ2-9     Office Visit from 05/13/2018 in [facility] Visit from 02/04/2018 in [facility] Office Visit from 01/28/2018 in [facility] Office Visit from 10/15/2017 in [facility] Visit from 05/21/2017 in [facility]  PHQ-2 Total Score  4  4  0  0  4  PHQ-9 Total Score  17  10  -  -  8       Assessment and Plan: Patient is a 67 year old female who who had a history of hypertension, sleep apnea, chronic kidney disease, morbid obesity, depression and generalized anxiety.  Her depression is better with Abilify and Cymbalta.  I discussed medication side effects and benefits.  I do believe she should see a neurology for memory impairment.  We will refer her to see [facility] neurology.  I will also add low-dose trazodone to help insomnia.  I offered therapy patient declined for counseling at this time.  I reviewed blood work results, collateral information from other providers and current medication.  Reassurance given.  Recommended to call us back if is any question, concern if he feels worsening of the symptoms.  Time spent 25 minutes.  More than 50% of the time spent in psychoeducation, counseling, coronation of care and discussed long-term prognosis of the illness.   Kathlee Nations, MD 06/12/2018,  2:11 PM

## 2018-06-16 ENCOUNTER — Other Ambulatory Visit: Payer: Self-pay | Admitting: Internal Medicine

## 2018-06-16 DIAGNOSIS — I10 Essential (primary) hypertension: Secondary | ICD-10-CM

## 2018-06-20 ENCOUNTER — Encounter (HOSPITAL_COMMUNITY): Payer: Self-pay | Admitting: Cardiology

## 2018-07-01 ENCOUNTER — Encounter: Payer: Self-pay | Admitting: Neurology

## 2018-07-06 DIAGNOSIS — J9690 Respiratory failure, unspecified, unspecified whether with hypoxia or hypercapnia: Secondary | ICD-10-CM | POA: Diagnosis not present

## 2018-07-06 DIAGNOSIS — M1990 Unspecified osteoarthritis, unspecified site: Secondary | ICD-10-CM | POA: Diagnosis not present

## 2018-07-06 DIAGNOSIS — G4733 Obstructive sleep apnea (adult) (pediatric): Secondary | ICD-10-CM | POA: Diagnosis not present

## 2018-07-06 DIAGNOSIS — G4737 Central sleep apnea in conditions classified elsewhere: Secondary | ICD-10-CM | POA: Diagnosis not present

## 2018-07-26 ENCOUNTER — Ambulatory Visit (HOSPITAL_COMMUNITY): Payer: Medicare HMO | Admitting: Psychiatry

## 2018-07-29 ENCOUNTER — Ambulatory Visit (INDEPENDENT_AMBULATORY_CARE_PROVIDER_SITE_OTHER): Payer: Medicare HMO | Admitting: Internal Medicine

## 2018-07-29 ENCOUNTER — Encounter: Payer: Self-pay | Admitting: Internal Medicine

## 2018-07-29 VITALS — BP 125/101 | HR 72 | Temp 98.9°F | Wt 286.2 lb

## 2018-07-29 DIAGNOSIS — F339 Major depressive disorder, recurrent, unspecified: Secondary | ICD-10-CM

## 2018-07-29 DIAGNOSIS — R5382 Chronic fatigue, unspecified: Secondary | ICD-10-CM

## 2018-07-29 DIAGNOSIS — R110 Nausea: Secondary | ICD-10-CM

## 2018-07-29 DIAGNOSIS — N189 Chronic kidney disease, unspecified: Secondary | ICD-10-CM | POA: Diagnosis not present

## 2018-07-29 DIAGNOSIS — M109 Gout, unspecified: Secondary | ICD-10-CM

## 2018-07-29 DIAGNOSIS — I5042 Chronic combined systolic (congestive) and diastolic (congestive) heart failure: Secondary | ICD-10-CM

## 2018-07-29 DIAGNOSIS — Z6841 Body Mass Index (BMI) 40.0 and over, adult: Secondary | ICD-10-CM

## 2018-07-29 DIAGNOSIS — E669 Obesity, unspecified: Secondary | ICD-10-CM | POA: Diagnosis not present

## 2018-07-29 DIAGNOSIS — Z8639 Personal history of other endocrine, nutritional and metabolic disease: Secondary | ICD-10-CM | POA: Diagnosis not present

## 2018-07-29 DIAGNOSIS — R42 Dizziness and giddiness: Secondary | ICD-10-CM | POA: Diagnosis not present

## 2018-07-29 DIAGNOSIS — F332 Major depressive disorder, recurrent severe without psychotic features: Secondary | ICD-10-CM

## 2018-07-29 DIAGNOSIS — F419 Anxiety disorder, unspecified: Secondary | ICD-10-CM

## 2018-07-29 DIAGNOSIS — R413 Other amnesia: Secondary | ICD-10-CM

## 2018-07-29 DIAGNOSIS — G4700 Insomnia, unspecified: Secondary | ICD-10-CM

## 2018-07-29 DIAGNOSIS — G4733 Obstructive sleep apnea (adult) (pediatric): Secondary | ICD-10-CM

## 2018-07-29 DIAGNOSIS — R630 Anorexia: Secondary | ICD-10-CM | POA: Diagnosis not present

## 2018-07-29 DIAGNOSIS — Z79899 Other long term (current) drug therapy: Secondary | ICD-10-CM

## 2018-07-29 DIAGNOSIS — R6881 Early satiety: Secondary | ICD-10-CM

## 2018-07-29 DIAGNOSIS — I130 Hypertensive heart and chronic kidney disease with heart failure and stage 1 through stage 4 chronic kidney disease, or unspecified chronic kidney disease: Secondary | ICD-10-CM | POA: Diagnosis not present

## 2018-07-29 LAB — POCT GLYCOSYLATED HEMOGLOBIN (HGB A1C): Hemoglobin A1C: 5.3 % (ref 4.0–5.6)

## 2018-07-29 LAB — GLUCOSE, CAPILLARY: Glucose-Capillary: 100 mg/dL — ABNORMAL HIGH (ref 70–99)

## 2018-07-29 MED ORDER — OMEPRAZOLE 20 MG PO CPDR: 20.0000 mg | DELAYED_RELEASE_CAPSULE | Freq: Every day | ORAL | 0 refills | Status: DC

## 2018-07-29 NOTE — Patient Instructions (Addendum)
Robin Hardin  Thank you for coming in to the clinic. Here are our recommendations  Start omeprazole 4m every day before meals Take all of your medications including your trazodone as prescribed. Please make sure to get your mammogram done  It was a pleasure seeing you.    Indigestion Indigestion is a feeling of pain, discomfort, burning, or fullness in the upper part of your belly (abdomen). It can come and go. It may occur often or rarely. Indigestion tends to happen while you are eating or right after you have finished eating. It may be worse at night and while bending over or lying down. Follow these instructions at home: Take these actions to lessen your pain or discomfort and to help avoid problems. Diet  Follow a diet as told by your doctor. You may need to avoid foods and drinks such as: ? Coffee and tea (with or without caffeine). ? Drinks that contain alcohol. ? Energy drinks and sports drinks. ? Carbonated drinks or sodas. ? Chocolate and cocoa. ? Peppermint and mint flavorings. ? Garlic and onions. ? Horseradish. ? Spicy and acidic foods, such as peppers, chili powder, curry powder, vinegar, hot sauces, and BBQ sauce. ? Citrus fruit juices and citrus fruits, such as oranges, lemons, and limes. ? Tomato-based foods, such as red sauce, chili, salsa, and pizza with red sauce. ? Fried and fatty foods, such as donuts, french fries, potato chips, and high-fat dressings. ? High-fat meats, such as hot dogs, rib eye steak, sausage, ham, and bacon. ? High-fat dairy items, such as whole milk, butter, and cream cheese.  Eat small meals often. Avoid eating large meals.  Avoid drinking large amounts of liquid with your meals.  Avoid eating meals during the 2-3 hours before bedtime.  Avoid lying down right after you eat.  Do not exercise right after you eat. General instructions  Pay attention to any changes in your symptoms.  Take over-the-counter and prescription  medicines only as told by your doctor. Do not take aspirin, ibuprofen, or other NSAIDs unless your doctor says it is okay.  Do not use any tobacco products, including cigarettes, chewing tobacco, and e-cigarettes. If you need help quitting, ask your doctor.  Wear loose clothes. Do not wear anything tight around your waist.  Raise (elevate) the head of your bed about 6 inches (15 cm).  Try to lower your stress. If you need help doing this, ask your doctor.  If you are overweight, lose an amount of weight that is healthy for you. Ask your doctor about a safe weight loss goal.  Keep all follow-up visits as told by your doctor. This is important. Contact a doctor if:  You have new symptoms.  You lose weight and you do not know why it is happening.  You have trouble swallowing, or it hurts to swallow.  Your symptoms do not get better with treatment.  Your symptoms last for more than two days.  You have a fever.  You throw up (vomit). Get help right away if:  You have pain in your arms, neck, jaw, teeth, or back.  You feel sweaty, dizzy, or light-headed.  You pass out (faint).  You have chest pain or shortness of breath.  You cannot stop throwing up, or you throw up blood.  Your poop (stool) is bloody or black.  You have very bad pain in your belly. This information is not intended to replace advice given to you by your health care provider. Make sure you discuss  any questions you have with your health care provider. Document Released: 09/08/2010 Document Revised: 01/12/2016 Document Reviewed: 12/01/2014 Elsevier Interactive Patient Education  [employer].

## 2018-07-30 LAB — CBC WITH DIFFERENTIAL/PLATELET
BASOS ABS: 0 10*3/uL (ref 0.0–0.2)
BASOS: 1 %
EOS (ABSOLUTE): 0.1 10*3/uL (ref 0.0–0.4)
Eos: 4 %
Hematocrit: 37.9 % (ref 34.0–46.6)
Hemoglobin: 12.3 g/dL (ref 11.1–15.9)
IMMATURE GRANULOCYTES: 0 %
Immature Grans (Abs): 0 10*3/uL (ref 0.0–0.1)
LYMPHS: 31 %
Lymphocytes Absolute: 0.9 10*3/uL (ref 0.7–3.1)
MCH: 29.4 pg (ref 26.6–33.0)
MCHC: 32.5 g/dL (ref 31.5–35.7)
MCV: 91 fL (ref 79–97)
MONOCYTES: 11 %
MONOS ABS: 0.3 10*3/uL (ref 0.1–0.9)
NEUTROS ABS: 1.6 10*3/uL (ref 1.4–7.0)
NEUTROS PCT: 53 %
Platelets: 219 10*3/uL (ref 150–450)
RBC: 4.18 x10E6/uL (ref 3.77–5.28)
RDW: 13.1 % (ref 12.3–15.4)
WBC: 2.9 10*3/uL — AB (ref 3.4–10.8)

## 2018-07-30 LAB — CMP14 + ANION GAP
ALT: 18 IU/L (ref 0–32)
AST: 19 IU/L (ref 0–40)
Albumin/Globulin Ratio: 2 (ref 1.2–2.2)
Albumin: 4.3 g/dL (ref 3.6–4.8)
Alkaline Phosphatase: 62 IU/L (ref 39–117)
Anion Gap: 19 mmol/L — ABNORMAL HIGH (ref 10.0–18.0)
BUN / CREAT RATIO: 14 (ref 12–28)
BUN: 16 mg/dL (ref 8–27)
Bilirubin Total: 0.4 mg/dL (ref 0.0–1.2)
CO2: 23 mmol/L (ref 20–29)
CREATININE: 1.15 mg/dL — AB (ref 0.57–1.00)
Calcium: 10.2 mg/dL (ref 8.7–10.3)
Chloride: 101 mmol/L (ref 96–106)
GFR, EST AFRICAN AMERICAN: 57 mL/min/{1.73_m2} — AB (ref >59)
GFR, EST NON AFRICAN AMERICAN: 49 mL/min/{1.73_m2} — AB (ref >59)
GLUCOSE: 103 mg/dL — AB (ref 65–99)
Globulin, Total: 2.1 g/dL (ref 1.5–4.5)
Potassium: 3.7 mmol/L (ref 3.5–5.2)
Sodium: 143 mmol/L (ref 134–144)
TOTAL PROTEIN: 6.4 g/dL (ref 6.0–8.5)

## 2018-07-30 LAB — LIPASE: Lipase: 40 U/L (ref 14–72)

## 2018-07-31 ENCOUNTER — Telehealth: Payer: Self-pay | Admitting: Internal Medicine

## 2018-07-31 ENCOUNTER — Other Ambulatory Visit: Payer: Self-pay

## 2018-07-31 ENCOUNTER — Encounter: Payer: Self-pay | Admitting: Internal Medicine

## 2018-07-31 DIAGNOSIS — R110 Nausea: Secondary | ICD-10-CM

## 2018-07-31 DIAGNOSIS — I5042 Chronic combined systolic (congestive) and diastolic (congestive) heart failure: Secondary | ICD-10-CM

## 2018-07-31 DIAGNOSIS — E785 Hyperlipidemia, unspecified: Secondary | ICD-10-CM

## 2018-07-31 DIAGNOSIS — I10 Essential (primary) hypertension: Secondary | ICD-10-CM

## 2018-07-31 DIAGNOSIS — E559 Vitamin D deficiency, unspecified: Secondary | ICD-10-CM

## 2018-07-31 MED ORDER — METFORMIN HCL 500 MG PO TABS: 500.0000 mg | ORAL_TABLET | Freq: Two times a day (BID) | ORAL | 1 refills | Status: DC

## 2018-07-31 MED ORDER — ASPIRIN 81 MG PO TBEC: 81.0000 mg | DELAYED_RELEASE_TABLET | Freq: Every day | ORAL | 3 refills | Status: DC

## 2018-07-31 MED ORDER — ALLOPURINOL 300 MG PO TABS: 300.0000 mg | ORAL_TABLET | Freq: Every day | ORAL | 3 refills | Status: DC

## 2018-07-31 MED ORDER — FUROSEMIDE 40 MG PO TABS: 40.0000 mg | ORAL_TABLET | Freq: Every day | ORAL | 3 refills | Status: DC

## 2018-07-31 MED ORDER — CARVEDILOL 25 MG PO TABS: 25.0000 mg | ORAL_TABLET | Freq: Two times a day (BID) | ORAL | 3 refills | Status: DC

## 2018-07-31 MED ORDER — OMEPRAZOLE 20 MG PO CPDR: 20.0000 mg | DELAYED_RELEASE_CAPSULE | Freq: Every day | ORAL | 0 refills | Status: DC

## 2018-07-31 MED ORDER — ROSUVASTATIN CALCIUM 20 MG PO TABS: 20.0000 mg | ORAL_TABLET | Freq: Every day | ORAL | 3 refills | Status: DC

## 2018-07-31 NOTE — Assessment & Plan Note (Signed)
Present w/ nausea and abdominal discomfort. Inciting event vomiting after eating takeout ribs. No sick contact. No fevers. Improves with food. Tenderness to deep palpation on physical exam. Some loss of appetite and weight loss. Unclear duration (states it has been couple months but symptoms started after her last visit with her psychiatrist which was about a month ago). Most likely GERD symptoms exacerbated by episode of food poisoning.   - Trial of Omeprazole 35m daily - Referral to GI if sxs do not resolve with PPI - Cbc, cmp, lipase

## 2018-07-31 NOTE — Assessment & Plan Note (Signed)
Has not had Hgb A1c checked since 2017. Prior hx of diabetes but last a1c checked showed 5.5 Will check today as part of healthcare maintenance.   - Hgb A1c

## 2018-07-31 NOTE — Telephone Encounter (Signed)
Spoke with patient and informed her of her negative lab results for infection, anemia, liver pathology or pancreatitis. Continuing to endorse nausea. She has not yet received her omeprazole from mail order pharmacy and should be receiving them in a week. Requesting refills of her other medications including her blood pressure medication, gout med, and cholesterol med. Refilled as appropriate

## 2018-07-31 NOTE — Progress Notes (Signed)
CC: Nausea  HPI: [name] is a 67 y.o. F w/ PMH of HTn, combined HF, OSA, CKD, Depression, Obesity and gout who presents to the clinic with complaints of new onset nausea and dizziness. She states that 'couple months ago' she had take-out ribs from a [employer] and had acute onset nausea and vomiting and was unable to finish her ribs. For the next couple days she had intermittent NBNB vomiting which resolved eventually. However she has been having intermittent episodes of nausea since then with feelings of 'motion sickness' without vomiting and significant fatigue. She has a history of chronic fatigue attributed to depression worsened by recent deterioration of her husband's health but she states this feels worse than before. She has had some weight loss as well but she attributes it to her loss of appetite due to nausea. Denies any fevers/chills/abdominal pain/ diarrhea/ constipation. Continues to endorse poor sleep. Denies any suicidal/homicidal ideation/ visual/ auditory / tactile hallucinations.  Past Medical History:  Diagnosis Date  . Acute respiratory failure with hypoxia ([place]) 09/26/2012  . Bilateral knee pain   . Cellulitis of lower leg    left  . Chronic combined systolic and diastolic congestive heart failure ([place]) 10/10/2012   2 D echo 09/2012:  Left ventricle: Extremely poor acoustic windows limit study. Overall LVEF appears to be severely depressed. Recomm ordering limited study with contrast to further evaluate LV systolic function. The cavity size was normal. Wall thickness was normal. Doppler parameters are consistent with abnormal left ventricular relaxation (grade 1 diastolic dysfunction).   2 D echo 03/2009 :  Left ventricle: The cavity size was normal. There was mild concentric hypertrophy. Systolic function was mildly reduced. The estimated ejection fraction was in the range of 45% to 50%. Diffuse hypokinesis. Doppler parameters are consistent with abnormal left  ventricular relaxation (grade 1 diastolic dysfunction).     . Chronic kidney disease 10/10/2012   Stage 2 with GFR of 64   . Depression   . Gout   . H/O: hysterectomy   . Hyperlipidemia   . Hypertension   . Malignant hypertension 09/26/2012  . Morbid obesity ([place])   . Nonischemic cardiomyopathy ([place])   . Sleep apnea    on cpap  . Unspecified essential hypertension 03/22/2009   Qualifier: Diagnosis of  By: Karrie Meres, RN, BSN, [name]     Review of Systems: Review of Systems  Constitutional: Positive for malaise/fatigue and weight loss. Negative for chills, diaphoresis and fever.  Respiratory: Negative for sputum production, shortness of breath and wheezing.   Cardiovascular: Positive for leg swelling. Negative for chest pain, palpitations and orthopnea.  Gastrointestinal: Positive for nausea. Negative for abdominal pain, constipation, diarrhea and vomiting.  Neurological: Positive for dizziness. Negative for sensory change, focal weakness, seizures, loss of consciousness and headaches.  Psychiatric/Behavioral: Positive for depression and memory loss. Negative for hallucinations and suicidal ideas. The patient is nervous/anxious and has insomnia.      Physical Exam: Vitals:   07/29/18 1434  BP: (!) 125/101  Pulse: 72  Temp: 98.9 F (37.2 C)  TempSrc: Oral  SpO2: 96%  Weight: 286 lb 3.2 oz (129.8 kg)    Gen: Obese women appear sad HEENT: NCAT head, hearing intact, EOMI, PERRL, No nasal discharge, MMM Neck: supple, ROM intact, no JVD, no cervical adenopathy CV: RRR, S1, S2 normal, No rubs, no murmurs, no gallops Pulm: CTAB, No rales, no wheezes, no dullness to percussion  Abd: Soft, BS+, Epigastric tenderness to deep palpation (feel sick), No  rebound, no guarding Extm: ROM intact, Peripheral pulses intact, 1+ pitting edema bilateral lower extremities Skin: Dry, Warm, normal turgor, no rashes, lesions, wounds.  Neuro: AAOx3, Cranial Nerve II-XII intact, DTR intact Psych: Depressed  mood. Flat affect.   Assessment & Plan:   Depression Present w/ worsening fatigue, loss of appetitie and weight loss with depressed mood. PHQ-9 was 17 on last visit. On evaluation was 16 today. Saw her psychiatrist who continued her ability and duloxetine with addition of trazodone for nighttime which she has not yet picked up. Continues to have social stressor with deteriorating husband's health.  - Recommend reaching out to other family members for caretaker vacation - C/w Abilify 66m daily, duloxetine 1217mdaily, and trazodone 501mhs per her psychiatrist  Nausea Present w/ nausea and abdominal discomfort. Inciting event vomiting after eating takeout ribs. No sick contact. No fevers. Improves with food. Tenderness to deep palpation on physical exam. Some loss of appetite and weight loss. Unclear duration (states it has been couple months but symptoms started after her last visit with her psychiatrist which was about a month ago). Most likely GERD symptoms exacerbated by episode of food poisoning.   - Trial of Omeprazole 29m76mily - Referral to GI if sxs do not resolve with PPI - Cbc, cmp, lipase  History of diabetes mellitus Has not had Hgb A1c checked since 2017. Prior hx of diabetes but last a1c checked showed 5.5 Will check today as part of healthcare maintenance.   - Hgb A1c    Patient seen with Dr. RainRebeca AlertJoshGilberto BetterY1

## 2018-07-31 NOTE — Assessment & Plan Note (Signed)
Present w/ worsening fatigue, loss of appetitie and weight loss with depressed mood. PHQ-9 was 17 on last visit. On evaluation was 16 today. Saw her psychiatrist who continued her ability and duloxetine with addition of trazodone for nighttime which she has not yet picked up. Continues to have social stressor with deteriorating husband's health.  - Recommend reaching out to other family members for caretaker vacation - C/w Abilify 73m daily, duloxetine 1263mdaily, and trazodone 5031mhs per her psychiatrist

## 2018-07-31 NOTE — Telephone Encounter (Signed)
metFORMIN (GLUCOPHAGE) 500 MG tablet, REFILL REQUEST @  [place], [place] [phone] (Phone) [phone] (Fax)

## 2018-08-04 NOTE — Progress Notes (Signed)
Internal Medicine Clinic Attending  I saw and evaluated the patient.  I personally confirmed the key portions of the history and exam documented by Dr. Truman Hayward and I reviewed pertinent patient test results.  The assessment, diagnosis, and plan were formulated together and I agree with the documentation in the resident's note.  Difficult to parse history, but I am concerned by her low appetite and early satiety, as well as new onset of GERD symptoms after age 67. Will refer to GI for further evaluation, start empiric PPI to see if it improves symptoms. Some symptoms may be related to poorly controlled depression.   Lenice Pressman, M.D., Ph.D.

## 2018-08-05 DIAGNOSIS — G4737 Central sleep apnea in conditions classified elsewhere: Secondary | ICD-10-CM | POA: Diagnosis not present

## 2018-08-05 DIAGNOSIS — J9690 Respiratory failure, unspecified, unspecified whether with hypoxia or hypercapnia: Secondary | ICD-10-CM | POA: Diagnosis not present

## 2018-08-05 DIAGNOSIS — G4733 Obstructive sleep apnea (adult) (pediatric): Secondary | ICD-10-CM | POA: Diagnosis not present

## 2018-08-06 ENCOUNTER — Telehealth (HOSPITAL_COMMUNITY): Payer: Self-pay | Admitting: Licensed Clinical Social Worker

## 2018-08-06 ENCOUNTER — Encounter: Payer: Self-pay | Admitting: *Deleted

## 2018-08-06 NOTE — Telephone Encounter (Signed)
CSW received phone call from patient stating that she has been out of [facility] for a week and needs help obtaining more.  Pt reports that she has an application to renew her [employer] patient assistance but has not completed her portion yet.  CSW called [employer] who confirms patient is still eligible for refills through their program but that patient has not requested a refill since October- her assistance expires 08/19/18.  CSW explained situation and they are overnighting her prescription.  CSW informed pt and encouraged her to complete her portion of the application as soon as possible and bring to the clinic so we can finalize the application- pt expressed understanding  CSW will continue to follow and assist as needed  Jorge Ny, [place] Worker [facility] [phone]

## 2018-08-25 ENCOUNTER — Other Ambulatory Visit (HOSPITAL_COMMUNITY): Payer: Self-pay

## 2018-08-26 ENCOUNTER — Encounter: Payer: Self-pay | Admitting: Gastroenterology

## 2018-08-27 ENCOUNTER — Telehealth: Payer: Self-pay

## 2018-08-27 NOTE — Telephone Encounter (Signed)
Requesting a refill on Tr[facility]adol @ [facility].

## 2018-08-28 MED ORDER — TRAMADOL HCL 50 MG PO TABS: 50.0000 mg | ORAL_TABLET | Freq: Two times a day (BID) | ORAL | 0 refills | Status: AC | PRN

## 2018-08-28 NOTE — Telephone Encounter (Signed)
Indication for chronic opioid: Chronic back and knee pain Medication and dose: Tramadol 43m q12hr PRN # pills per month: 60 Last UDS date: 09/2016 (overdue for repeat) Opioid Treatment Agreement signed (Y/N): Y Opioid Treatment Agreement last reviewed with patient:  05/13/18 NCCSRS reviewed this encounter (include red flags):  [place] No red flags. No additional requests for opioid since last visit.

## 2018-09-05 DIAGNOSIS — M1990 Unspecified osteoarthritis, unspecified site: Secondary | ICD-10-CM | POA: Diagnosis not present

## 2018-09-05 DIAGNOSIS — G4733 Obstructive sleep apnea (adult) (pediatric): Secondary | ICD-10-CM | POA: Diagnosis not present

## 2018-09-05 DIAGNOSIS — G4737 Central sleep apnea in conditions classified elsewhere: Secondary | ICD-10-CM | POA: Diagnosis not present

## 2018-09-05 DIAGNOSIS — J9690 Respiratory failure, unspecified, unspecified whether with hypoxia or hypercapnia: Secondary | ICD-10-CM | POA: Diagnosis not present

## 2018-09-09 ENCOUNTER — Ambulatory Visit: Payer: Self-pay | Admitting: Gastroenterology

## 2018-09-10 ENCOUNTER — Encounter

## 2018-09-10 ENCOUNTER — Encounter (HOSPITAL_COMMUNITY): Payer: Self-pay | Admitting: Cardiology

## 2018-09-10 ENCOUNTER — Ambulatory Visit (HOSPITAL_COMMUNITY)
Admission: RE | Admit: 2018-09-10 | Discharge: 2018-09-10 | Disposition: A | Discharge status: Home / Self Care | Payer: Medicare HMO | Source: Ambulatory Visit | Attending: Cardiology | Admitting: Cardiology

## 2018-09-10 VITALS — BP 125/86 | HR 74 | Wt 277.6 lb

## 2018-09-10 DIAGNOSIS — I428 Other cardiomyopathies: Secondary | ICD-10-CM | POA: Insufficient documentation

## 2018-09-10 DIAGNOSIS — F329 Major depressive disorder, single episode, unspecified: Secondary | ICD-10-CM | POA: Diagnosis not present

## 2018-09-10 DIAGNOSIS — I130 Hypertensive heart and chronic kidney disease with heart failure and stage 1 through stage 4 chronic kidney disease, or unspecified chronic kidney disease: Secondary | ICD-10-CM | POA: Insufficient documentation

## 2018-09-10 DIAGNOSIS — Z79899 Other long term (current) drug therapy: Secondary | ICD-10-CM | POA: Insufficient documentation

## 2018-09-10 DIAGNOSIS — E785 Hyperlipidemia, unspecified: Secondary | ICD-10-CM | POA: Insufficient documentation

## 2018-09-10 DIAGNOSIS — M25561 Pain in right knee: Secondary | ICD-10-CM | POA: Insufficient documentation

## 2018-09-10 DIAGNOSIS — Z86718 Personal history of other venous thrombosis and embolism: Secondary | ICD-10-CM | POA: Diagnosis not present

## 2018-09-10 DIAGNOSIS — M25562 Pain in left knee: Secondary | ICD-10-CM | POA: Insufficient documentation

## 2018-09-10 DIAGNOSIS — I10 Essential (primary) hypertension: Secondary | ICD-10-CM

## 2018-09-10 DIAGNOSIS — N189 Chronic kidney disease, unspecified: Secondary | ICD-10-CM | POA: Insufficient documentation

## 2018-09-10 DIAGNOSIS — Z7984 Long term (current) use of oral hypoglycemic drugs: Secondary | ICD-10-CM | POA: Insufficient documentation

## 2018-09-10 DIAGNOSIS — R110 Nausea: Secondary | ICD-10-CM | POA: Insufficient documentation

## 2018-09-10 DIAGNOSIS — G4733 Obstructive sleep apnea (adult) (pediatric): Secondary | ICD-10-CM | POA: Insufficient documentation

## 2018-09-10 DIAGNOSIS — R42 Dizziness and giddiness: Secondary | ICD-10-CM | POA: Diagnosis not present

## 2018-09-10 DIAGNOSIS — Z7409 Other reduced mobility: Secondary | ICD-10-CM | POA: Diagnosis not present

## 2018-09-10 DIAGNOSIS — M109 Gout, unspecified: Secondary | ICD-10-CM | POA: Insufficient documentation

## 2018-09-10 DIAGNOSIS — Z8249 Family history of ischemic heart disease and other diseases of the circulatory system: Secondary | ICD-10-CM | POA: Insufficient documentation

## 2018-09-10 DIAGNOSIS — E1122 Type 2 diabetes mellitus with diabetic chronic kidney disease: Secondary | ICD-10-CM | POA: Diagnosis not present

## 2018-09-10 DIAGNOSIS — Z7982 Long term (current) use of aspirin: Secondary | ICD-10-CM | POA: Insufficient documentation

## 2018-09-10 DIAGNOSIS — I5042 Chronic combined systolic (congestive) and diastolic (congestive) heart failure: Secondary | ICD-10-CM

## 2018-09-10 LAB — BASIC METABOLIC PANEL
ANION GAP: 13 (ref 5–15)
BUN: 17 mg/dL (ref 8–23)
CO2: 27 mmol/L (ref 22–32)
Calcium: 9.8 mg/dL (ref 8.9–10.3)
Chloride: 100 mmol/L (ref 98–111)
Creatinine, Ser: 1.33 mg/dL — ABNORMAL HIGH (ref 0.44–1.00)
GFR calc non Af Amer: 41 mL/min — ABNORMAL LOW (ref >60)
GFR, EST AFRICAN AMERICAN: 48 mL/min — AB (ref >60)
Glucose, Bld: 111 mg/dL — ABNORMAL HIGH (ref 70–99)
Potassium: 3.3 mmol/L — ABNORMAL LOW (ref 3.5–5.1)
Sodium: 140 mmol/L (ref 135–145)

## 2018-09-10 LAB — BRAIN NATRIURETIC PEPTIDE: B NATRIURETIC PEPTIDE 5: 81.7 pg/mL (ref 0.0–100.0)

## 2018-09-10 MED ORDER — SPIRONOLACTONE 25 MG PO TABS: 25.0000 mg | ORAL_TABLET | Freq: Every day | ORAL | 3 refills | Status: DC

## 2018-09-10 NOTE — Progress Notes (Signed)
[name], female   DOB: 03/04/1951, 68 y.o.   MRN: [id number]    Advanced Heart Failure Clinic Note   Primary Care: Robin Anis, MD Primary Cardiologist: Dr. Aundra Hardin  HPI:  Robin Hardin is a 68 y.o. AAF with history of morbid obesity, HTN, and diastolic HF diagnosed in 5697.  She was eventually diagnosed with systolic heart failure by v-gram (09/2012) with an EF approximately 25%.  LHC showed no significant coronary disease.  She also has OSA with CPAP.   She had stopped taking her fluid pills in Feb 2014 due to cramping and ended up being admitted with respiratory failure.  She was diuresed ~40 pounds.  Her torsemide was changed to lasix to avoid cramping.  Echo showed LVEF 25%. Discharge weight 387 lbs. Echo in 12/14 showed EF 40-45% with mild LV dilation.  Echo in 9/16 showed EF 35-40% with grade II diastolic dysfunction.  She had atypical chest pain in 6/17, Cardiolite at that time showed EF up to 52% with no ischemia/infarction. Most recent echo in 9/19 showed EF 40-45%.    She returns for followup of CHF.  She is very inactive, has a motorized scooter that she uses to get around.  She will walk short distances, dyspnea if she tries to walk further. She has not been using CPAP.  No chest pain.  No orthopnea/PND. She has had worsened depression in setting of husband's illnessses. She also reports chronic vertigo-like symptoms and nausea.  Weight is actually down 16 lbs.   REDS clip 28%    Labs (7/14): K 3.9 Creatinine 0.91, LDL 105 Labs (06/05/13): K 3.7 Creatinine 0.87 Pro BNP 56  Labs (1/15): K 4, creatinine 0.9 Labs (07/27/14): K 4.2 Creatinine 0.92 Hgb 11.8 HIV NR Labs (5/16): K 5, creatinine 2.57, TSH normal, HCT 36.5 Labs (7/16): K 4, creatinine 1.08, LDL 132 Labs (9/16): K 3.6, creatinine 1.17 Labs (10/16): creatinine 2.19 => 1.34 Labs (11/16): K 4, creatinine 1.0, BNP 114 Labs (5/17): LDL 221, creatinine 2.07 Labs (6/17): K 4, creatinine 1.15 Labs (10/17):  LDL 122, HDL 58 Labs (2/18): K 3.7, creatinine 1.28 Labs (3/18): K 4.4, creatinine 1.17 Labs (8/18): TSH normal Labs (9/19): LDL 105 Labs (12/19): K 3.7, creatinine 1.15  Past Medical History:  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 She was hospitalized at [facility] in [place] where she had a cath showing normal coronaries but EF 30-35% on LV-gram. She has been hospitalized two other times since then in [place] for CHF. Echo (8/10) showed EF 45-50%, mild global HK, mild LVH, grade I diastolic dysfunction, no significant valvular problems.  Echo (2/14) with EF severely decreased (poor windows so not quantified, appears about 25%).  Echo (12/14) with EF 40-45%, mild LV dilation, moderate LVH, moderate LAE.   - Echo (9/16) with EF 35-40%, grade II diastolic dysfunction, mild MR.  - Lexiscan Cardiolite (6/17) with EF 52%, no ischemia/infarction.  - Echo (9/19): EF 40-45%.  2. HTN  3. Depression  4. Morbid obesity  5. Hyperlipidemia  6. OSA: sporadic with CPAP  7. Gout on allopurinol  8. Bilateral knee pain, probable osteoarthritis  9. Diabetes type II  10. Left lower leg cellulitis in 10/11 with normal [place] for DVT 11. CKD.   Family History:  No FH of premature CAD  asthma: mother, 2 brothers  heart disease: mother  clotting disorders: mother (stroke)   Social History:  Recently from [place] to [place].  Married  with 2 adopted children.  Never smoked, no ETOH or drugs.  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   Review of Systems  All systems reviewed and negative except as per HPI.   Current Outpatient Medications  Medication Sig Dispense Refill  . allopurinol (ZYLOPRIM) 300 MG tablet Take 1 tablet (300 mg total) by mouth daily. 90 tablet 3  . ALOE PO Take 1 capsule by mouth 2 (two) times daily.    . ARIPiprazole (ABILIFY) 5 MG tablet Take 1 tablet (5 mg total) by mouth daily. 90 tablet 0  . aspirin 81 MG EC tablet Take 1 tablet (81  mg total) by mouth daily. 90 tablet 3  . carvedilol (COREG) 25 MG tablet Take 1 tablet (25 mg total) by mouth 2 (two) times daily with a meal. 180 tablet 3  . furosemide (LASIX) 40 MG tablet Take 1 tablet (40 mg total) by mouth daily. 30 tablet 3  . metFORMIN (GLUCOPHAGE) 500 MG tablet Take 1 tablet (500 mg total) by mouth 2 (two) times daily with a meal. 180 tablet 1  . omeprazole (PRILOSEC) 20 MG capsule Take 1 capsule (20 mg total) by mouth daily. 90 capsule 0  . rosuvastatin (CRESTOR) 20 MG tablet Take 1 tablet (20 mg total) by mouth daily. 90 tablet 3  . sacubitril-valsartan (ENTRESTO) 49-51 MG Take 1 tablet by mouth 2 (two) times daily. 60 tablet 11  . spironolactone (ALDACTONE) 25 MG tablet Take 1 tablet (25 mg total) by mouth daily. 30 tablet 3  . traMADol (ULTRAM) 50 MG tablet Take 1 tablet (50 mg total) by mouth every 12 (twelve) hours as needed. 60 tablet 0  . traZODone (DESYREL) 50 MG tablet Take 1 tablet (50 mg total) by mouth at bedtime. 90 tablet 0   No current facility-administered medications for this encounter.     No Known Allergies   Vitals:   09/10/18 1028 09/10/18 1220 09/10/18 1221  BP: 130/80 128/88 125/86  Pulse: 74    SpO2: 94%    Weight: 125.9 kg (277 lb 9.6 oz)     Wt Readings from Last 3 Encounters:  09/10/18 125.9 kg (277 lb 9.6 oz)  07/29/18 129.8 kg (286 lb 3.2 oz)  05/13/18 (!) 136.4 kg (300 lb 11.2 oz)     PHYSICAL EXAM: General: NAD Neck: No JVD, no thyromegaly or thyroid nodule.  Lungs: Clear to auscultation bilaterally with normal respiratory effort. CV: Nondisplaced PMI.  Heart regular S1/S2, no S3/S4, no murmur.  No peripheral edema.  No carotid bruit.  Normal pedal pulses.  Abdomen: Soft, nontender, no hepatosplenomegaly, no distention.  Skin: Intact without lesions or rashes.  Neurologic: Alert and oriented x 3.  Psych: Normal affect. Extremities: No clubbing or cyanosis.  HEENT: Normal.   ASSESSMENT & PLAN: 1. Chronic systolic CHF:  Nonischemic cardiomyopathy.  Last echo 9/19 with EF 40-45%. Out of range for ICD.  NYHA class III.  She is very inactive, but this seems more due to orthopedic issues and deconditioning than CHF.  She is not volume overloaded on exam or by REDS vest.  - I will check orthostatics but think she is describing vertigo rather than orthostatic hypotension. Resting BP is not low.  - Continue Lasix 40 mg daily. BMET today.   - Increase spironolactone to 25 mg daily.  BMET 10 days.  - Continue Entresto 49/51 bid.  - Continue Coreg 25 mg BID - I am going to try to arrange for home PT to see  if we can get her more mobile. I do not think that she could manage cardiac or pulmonary rehab yet.  2. OSA: Has not tolerated CPAP.   3. Obesity: Weight has come down some.  She gets very little exercise but has been trying to watch her diet.   4. Hyperlipidemia: Continue Crestor.    5. Depression: She has a psychiatrist. Think this plays a major role in her symptomatology.   Followup in 3 months with NP/PA.                                                               Loralie Champagne 09/10/2018

## 2018-09-10 NOTE — Patient Instructions (Signed)
Clip Measurement was done today and you are within normal limits at 28%.  Your orthostatic vitals were checked today.  INCREASE Spironolactone to 79m (1 tab) daily. This prescription has been sent to your pharmacy.  Labs were done today. We will call you with any ABNORMAL results. No news is good news!  Lab work will need to be done again in 10 days.  You have been referred to [facility] They will call you to schedule a time to visit with you.  Your physician recommends that you schedule a follow-up appointment in 3 months.

## 2018-09-10 NOTE — Progress Notes (Signed)
ReDS Vest - 09/10/18 1100      ReDS Vest   MR   Moderate    Estimated volume prior to reading  Med    Fitting Posture  Sitting    Height Marker  Short    Ruler Value  [place]    ReDS Value  28

## 2018-09-11 ENCOUNTER — Ambulatory Visit (HOSPITAL_COMMUNITY): Payer: Medicare HMO | Admitting: Psychiatry

## 2018-09-18 ENCOUNTER — Ambulatory Visit (INDEPENDENT_AMBULATORY_CARE_PROVIDER_SITE_OTHER): Payer: Medicare HMO | Admitting: Neurology

## 2018-09-18 ENCOUNTER — Other Ambulatory Visit: Payer: Self-pay

## 2018-09-18 ENCOUNTER — Encounter: Payer: Self-pay | Admitting: Neurology

## 2018-09-18 DIAGNOSIS — R413 Other amnesia: Secondary | ICD-10-CM | POA: Diagnosis not present

## 2018-09-18 NOTE — Patient Instructions (Addendum)
1. Schedule open MRI brain without contrast  We have sent a referral for your OPEN MRI to [facility].  They will contact you directly to schedule your appointment.  [facility] is located at [address] [place], [place] [address].  If you need to speak with [facility] for any reason, they can be reached at [phone]  2. Schedule home sleep study 3. Schedule Neurocognitive testing 4. Memory changes can be caused by depression. If MRI brain is normal and Neurocognitive testing also indicates this as the cause of memory changes, recommend seeing a therapist in addition to psychiatrist 5. Follow-up after tests, call for any changes   RECOMMENDATIONS FOR ALL PATIENTS WITH MEMORY PROBLEMS: 1. Continue to exercise (Recommend 30 minutes of walking everyday, or 3 hours every week) 2. Increase social interactions - continue going to [place] and enjoy social gatherings with friends and family 3. Eat healthy, avoid fried foods and eat more fruits and vegetables 4. Maintain adequate blood pressure, blood sugar, and blood cholesterol level. Reducing the risk of stroke and cardiovascular disease also helps promoting better memory. 5. Avoid stressful situations. Live a simple life and avoid aggravations. Organize your time and prepare for the next day in anticipation. 6. Sleep well, avoid any interruptions of sleep and avoid any distractions in the bedroom that may interfere with adequate sleep quality 7. Avoid sugar, avoid sweets as there is a strong link between excessive sugar intake, diabetes, and cognitive impairment The Mediterranean diet has been shown to help patients reduce the risk of progressive memory disorders and reduces cardiovascular risk. This includes eating fish, eat fruits and green leafy vegetables, nuts like almonds and hazelnuts, walnuts, and also use olive oil. Avoid fast foods and fried foods as much as possible. Avoid sweets and sugar as sugar use has been linked to worsening  of memory function.

## 2018-09-18 NOTE — Progress Notes (Signed)
NEUROLOGY CONSULTATION NOTE  Robin Hardin MRN: [id number] DOB: 08-29-1950  Referring provider: Dr. Berniece Andreas Primary care provider: Dr. Gilberto Better  Reason for consult:  Memory impairment  Dear Dr Adele Schilder:  Thank you for your kind referral of Robin Hardin for consultation of the above symptoms. Although her history is well known to you, please allow me to reiterate it for the purpose of our medical record. She is alone in the office today. Records and images were personally reviewed where available.  HISTORY OF PRESENT ILLNESS: This is a 68 year old left-handed woman with a history of hypertension, hyperlipidemia, depression, insomnia, presenting for evaluation of memory loss. She started noticing memory changes around a year ago, where she would forget things that she needed to remember, such as appointments and "very important things." She has gotten lost driving even with her GPS. She is mostly up to date with her bills but somehow forgets to pay the phone bill, eventually getting to it. She manages her and her husband's medications without difficulties. She denies leaving the stove on but has not cooked recently due to weakness in her arms, difficulty washing pots. She has been told she repeats herself, a couple of years ago her grandchildren were laughing at her because she said grace 3 times. She has noticed difficulty writing over the past 5 months, initially it was more when she was nervous, but now has been more consistent. There is no family history of dementia, no history of concussions or alcohol use.  She reports other new symptoms over the past few months. For the past 4 months, she has been having more difficulties moving around. She has back and bilateral knee pain, but now feels like she cannot move to a different position when lying in bed. She has to roll over in bed in order to move. She has had more difficulty dressing herself, noticing her left arm is sore and would  be trembling when she reaches her arm up. She has had nausea and motion sickness, she can't just turn around otherwise she would have a spinning sensation. This happened once while driving. She has lost weight and noticed appetite is not good. She has had headaches mostly on a daily basis with brief pain going through her head for a few seconds. No focal numbness/tingling. No bowel/bladder dysfunction, anosmia, diplopia, dysarthria/dysphagia. She has a history of sleep apnea but did not tolerate CPAP, she gets 5 to 6 hours of unrefreshing sleep, with occasional snoring and daytime drowsiness. She has been helping her husband since his bypass surgery in December 2018 and reports she is very depressed, "we need help."  She was helping him but now needs help herself.    Laboratory Data: Lab Results  Component Value Date   TSH 3.170 05/13/2018   Lab Results  Component Value Date   VITAMINB12 1,093 05/13/2018     PAST MEDICAL HISTORY: Past Medical History:  Diagnosis Date  . Acute respiratory failure with hypoxia ([place]) 09/26/2012  . Bilateral knee pain   . Cellulitis of lower leg    left  . Chronic combined systolic and diastolic congestive heart failure ([place]) 10/10/2012   2 D echo 09/2012:  Left ventricle: Extremely poor acoustic windows limit study. Overall LVEF appears to be severely depressed. Recomm ordering limited study with contrast to further evaluate LV systolic function. The cavity size was normal. Wall thickness was normal. Doppler parameters are consistent with abnormal left ventricular relaxation (grade 1 diastolic dysfunction).  2 D echo 03/2009 :  Left ventricle: The cavity size was normal. There was mild concentric hypertrophy. Systolic function was mildly reduced. The estimated ejection fraction was in the range of 45% to 50%. Diffuse hypokinesis. Doppler parameters are consistent with abnormal left ventricular relaxation (grade 1 diastolic dysfunction).     . Chronic kidney disease  10/10/2012   Stage 2 with GFR of 64   . Depression   . Gout   . H/O: hysterectomy   . Hyperlipidemia   . Hypertension   . Malignant hypertension 09/26/2012  . Morbid obesity ([place])   . Nonischemic cardiomyopathy ([place])   . Sleep apnea    on cpap  . Unspecified essential hypertension 03/22/2009   Qualifier: Diagnosis of  By: Karrie Meres, RN, BSN, [name]      PAST SURGICAL HISTORY: No past surgical history on file.  MEDICATIONS: Current Outpatient Medications on File Prior to Visit  Medication Sig Dispense Refill  . allopurinol (ZYLOPRIM) 300 MG tablet Take 1 tablet (300 mg total) by mouth daily. 90 tablet 3  . ALOE PO Take 1 capsule by mouth 2 (two) times daily.    . ARIPiprazole (ABILIFY) 5 MG tablet Take 1 tablet (5 mg total) by mouth daily. 90 tablet 0  . aspirin 81 MG EC tablet Take 1 tablet (81 mg total) by mouth daily. 90 tablet 3  . carvedilol (COREG) 25 MG tablet Take 1 tablet (25 mg total) by mouth 2 (two) times daily with a meal. 180 tablet 3  . furosemide (LASIX) 40 MG tablet Take 1 tablet (40 mg total) by mouth daily. 30 tablet 3  . metFORMIN (GLUCOPHAGE) 500 MG tablet Take 1 tablet (500 mg total) by mouth 2 (two) times daily with a meal. 180 tablet 1  . omeprazole (PRILOSEC) 20 MG capsule Take 1 capsule (20 mg total) by mouth daily. 90 capsule 0  . rosuvastatin (CRESTOR) 20 MG tablet Take 1 tablet (20 mg total) by mouth daily. 90 tablet 3  . sacubitril-valsartan (ENTRESTO) 49-51 MG Take 1 tablet by mouth 2 (two) times daily. 60 tablet 11  . spironolactone (ALDACTONE) 25 MG tablet Take 1 tablet (25 mg total) by mouth daily. 30 tablet 3  . traMADol (ULTRAM) 50 MG tablet Take 1 tablet (50 mg total) by mouth every 12 (twelve) hours as needed. 60 tablet 0  . traZODone (DESYREL) 50 MG tablet Take 1 tablet (50 mg total) by mouth at bedtime. 90 tablet 0   No current facility-administered medications on file prior to visit.     ALLERGIES: No Known Allergies  FAMILY HISTORY: Family  History  Problem Relation Age of Onset  . Asthma Mother   . Heart disease Mother   . Stroke Mother        clotting disorders  . Asthma Brother   . Asthma Brother     SOCIAL HISTORY: Social History   Socioeconomic History  . Marital status: Married    Spouse name: Not on file  . Number of children: 2  . Years of education: Not on file  . Highest education level: Not on file  Occupational History  . Occupation: retired Forensic psychologist: UNEMPLOYED  Social Needs  . Financial resource strain: Not hard at all  . Food insecurity:    Worry: Never true    Inability: Never true  . Transportation needs:    Medical: No    Non-medical: No  Tobacco Use  . Smoking status: Never Smoker  . Smokeless  tobacco: Never Used  Substance and Sexual Activity  . Alcohol use: No    Alcohol/week: 0.0 standard drinks  . Drug use: No  . Sexual activity: Not Currently    Partners: Male  Lifestyle  . Physical activity:    Days per week: 0 days    Minutes per session: 0 min  . Stress: Rather much  Relationships  . Social connections:    Talks on phone: Once a week    Gets together: Once a week    Attends religious service: More than 4 times per year    Active member of club or organization: No    Attends meetings of clubs or organizations: Never    Relationship status: Married  . Intimate partner violence:    Fear of current or ex partner: No    Emotionally abused: No    Physically abused: No    Forced sexual activity: No  Other Topics Concern  . Not on file  Social History Narrative  . Not on file    REVIEW OF SYSTEMS: Constitutional: No fevers, chills, or sweats, no generalized fatigue, change in appetite Eyes: No visual changes, double vision, eye pain Ear, nose and throat: No hearing loss, ear pain, nasal congestion, sore throat Cardiovascular: No chest pain, palpitations Respiratory:  No shortness of breath at rest or with exertion, wheezes GastrointestinaI: No nausea,  vomiting, diarrhea, abdominal pain, fecal incontinence Genitourinary:  No dysuria, urinary retention or frequency Musculoskeletal:  + neck pain, back pain Integumentary: No rash, pruritus, skin lesions Neurological: as above Psychiatric: + depression, insomnia, anxiety Endocrine: No palpitations, fatigue, diaphoresis, mood swings, change in appetite, change in weight, increased thirst Hematologic/Lymphatic:  No anemia, purpura, petechiae. Allergic/Immunologic: no itchy/runny eyes, nasal congestion, recent allergic reactions, rashes  PHYSICAL EXAM: Vitals:   09/18/18 0857  BP: 130/76  Pulse: 82  SpO2: 95%   General: No acute distress, morbidly obese, flat affect with hypomimia (does not move mouth much when speaking) Head:  Normocephalic/atraumatic Eyes: Fundoscopic exam shows bilateral sharp discs, no vessel changes, exudates, or hemorrhages Neck: supple, no paraspinal tenderness, full range of motion Back: No paraspinal tenderness Heart: regular rate and rhythm Lungs: Clear to auscultation bilaterally. Vascular: No carotid bruits. Skin/Extremities: No rash, no edema Neurological Exam: Mental status: alert and oriented to person, place, and time, no dysarthria or aphasia, Fund of knowledge is appropriate. Remote memory intact.  Attention and concentration are normal.    Able to name objects and repeat phrases. Had difficulty with visuospatial tasks, fluency, and abstraction. Montreal Cognitive Assessment  09/18/2018  Visuospatial/ Executive (0/5) 1  Naming (0/3) 3  Attention: Read list of digits (0/2) 2  Attention: Read list of letters (0/1) 1  Attention: Serial 7 subtraction starting at 100 (0/3) 1  Language: Repeat phrase (0/2) 2  Language : Fluency (0/1) 0  Abstraction (0/2) 0  Delayed Recall (0/5) 2  Orientation (0/6) 6  Total 18  Adjusted Score (based on education) 19   Cranial nerves: CN I: not tested CN II: pupils equal, round and reactive to light, visual fields  intact, fundi unremarkable. CN III, IV, VI:  full range of motion, no nystagmus, no ptosis CN V: facial sensation intact CN VII: upper and lower face symmetric CN VIII: hearing intact to finger rub CN IX, X: gag intact, uvula midline CN XI: sternocleidomastoid and trapezius muscles intact CN XII: tongue midline Bulk & Tone: cogwheeling on right, no fasciculations. Motor: 5/5 throughout with no pronator drift. Sensation: intact to  light touch, cold, pin, vibration and joint position sense.  No extinction to double simultaneous stimulation.  Deep Tendon Reflexes: +1 on both UE, unable to elicit on both LE, no ankle clonus Plantar responses: downgoing bilaterally Cerebellar: no incoordination on finger to nose testing Gait: sitting on wheelchair, needed 2-person assist, gait not tested (unable to get patient out of chair) Tremor: none Fair finger and foot taps, no micrographia, good spiral drawing (see attached paper)  IMPRESSION: This is a 68 year old left-handed woman with a history of hypertension, hyperlipidemia, depression, insomnia, presenting for evaluation of memory loss. Her neurological exam shows some parkinsonian features (facial hypomimia, right-sided cogwheeling), she also reports more difficulty moving (?bradykinesia). 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 We discussed different causes of memory loss, MRI brain without contrast will be ordered to assess for underlying structural abnormality. We discussed how poorly controlled sleep apnea can also cause cognitive changes, she is agreeable to repeating a home sleep study. She will be scheduled for Neuropsychological testing to further evaluate memory complaints. Continue follow-up with Psychiatry, she may benefit from seeing a therapist as well, but declines this today. We discussed the importance of control of vascular risk factors, physical exercise and brain stimulation exercises for brain health. She was given  more information/resources for help at home. She will follow-up after the tests and knows to call for any changes.   Thank you for allowing me to participate in the care of this patient. Please do not hesitate to call for any questions or concerns.   Ellouise Newer, M.D.  CC: Dr. Adele Schilder, Dr. Truman Hayward

## 2018-09-23 ENCOUNTER — Ambulatory Visit (HOSPITAL_COMMUNITY)
Admission: RE | Admit: 2018-09-23 | Discharge: 2018-09-23 | Disposition: A | Discharge status: Home / Self Care | Payer: Medicare HMO | Source: Ambulatory Visit | Attending: Cardiology | Admitting: Cardiology

## 2018-09-23 ENCOUNTER — Telehealth (HOSPITAL_COMMUNITY): Payer: Self-pay

## 2018-09-23 DIAGNOSIS — I5042 Chronic combined systolic (congestive) and diastolic (congestive) heart failure: Secondary | ICD-10-CM | POA: Diagnosis not present

## 2018-09-23 LAB — BASIC METABOLIC PANEL
ANION GAP: 15 (ref 5–15)
BUN: 18 mg/dL (ref 8–23)
CHLORIDE: 99 mmol/L (ref 98–111)
CO2: 28 mmol/L (ref 22–32)
Calcium: 9.5 mg/dL (ref 8.9–10.3)
Creatinine, Ser: 1.58 mg/dL — ABNORMAL HIGH (ref 0.44–1.00)
GFR calc non Af Amer: 34 mL/min — ABNORMAL LOW (ref >60)
GFR, EST AFRICAN AMERICAN: 39 mL/min — AB (ref >60)
GLUCOSE: 106 mg/dL — AB (ref 70–99)
Potassium: 3.1 mmol/L — ABNORMAL LOW (ref 3.5–5.1)
Sodium: 142 mmol/L (ref 135–145)

## 2018-09-23 MED ORDER — POTASSIUM CHLORIDE ER 20 MEQ PO TBCR: 20.0000 meq | EXTENDED_RELEASE_TABLET | Freq: Every day | ORAL | 6 refills | Status: DC

## 2018-09-24 NOTE — Telephone Encounter (Signed)
Spoke with pt about low potassium. Pt agreeable to new prescription potassium and with the lab appointment. Pt verbalized understanding.

## 2018-09-25 ENCOUNTER — Ambulatory Visit (HOSPITAL_COMMUNITY): Payer: Medicare HMO | Admitting: Psychiatry

## 2018-09-30 ENCOUNTER — Ambulatory Visit: Payer: Self-pay | Admitting: Gastroenterology

## 2018-10-06 DIAGNOSIS — M1990 Unspecified osteoarthritis, unspecified site: Secondary | ICD-10-CM | POA: Diagnosis not present

## 2018-10-06 DIAGNOSIS — G4733 Obstructive sleep apnea (adult) (pediatric): Secondary | ICD-10-CM | POA: Diagnosis not present

## 2018-10-06 DIAGNOSIS — G4737 Central sleep apnea in conditions classified elsewhere: Secondary | ICD-10-CM | POA: Diagnosis not present

## 2018-10-06 DIAGNOSIS — J9690 Respiratory failure, unspecified, unspecified whether with hypoxia or hypercapnia: Secondary | ICD-10-CM | POA: Diagnosis not present

## 2018-10-07 ENCOUNTER — Ambulatory Visit (HOSPITAL_COMMUNITY)
Admission: RE | Admit: 2018-10-07 | Discharge: 2018-10-07 | Disposition: A | Discharge status: Home / Self Care | Payer: Medicare HMO | Source: Ambulatory Visit | Attending: Cardiology | Admitting: Cardiology

## 2018-10-07 DIAGNOSIS — I5042 Chronic combined systolic (congestive) and diastolic (congestive) heart failure: Secondary | ICD-10-CM | POA: Diagnosis not present

## 2018-10-07 LAB — BASIC METABOLIC PANEL
Anion gap: 12 (ref 5–15)
BUN: 11 mg/dL (ref 8–23)
CO2: 27 mmol/L (ref 22–32)
Calcium: 9.6 mg/dL (ref 8.9–10.3)
Chloride: 103 mmol/L (ref 98–111)
Creatinine, Ser: 1.29 mg/dL — ABNORMAL HIGH (ref 0.44–1.00)
GFR calc Af Amer: 50 mL/min — ABNORMAL LOW (ref >60)
GFR calc non Af Amer: 43 mL/min — ABNORMAL LOW (ref >60)
Glucose, Bld: 112 mg/dL — ABNORMAL HIGH (ref 70–99)
Potassium: 3.6 mmol/L (ref 3.5–5.1)
SODIUM: 142 mmol/L (ref 135–145)

## 2018-10-21 DIAGNOSIS — I6782 Cerebral ischemia: Secondary | ICD-10-CM | POA: Diagnosis not present

## 2018-10-21 DIAGNOSIS — R413 Other amnesia: Secondary | ICD-10-CM | POA: Diagnosis not present

## 2018-10-21 DIAGNOSIS — G319 Degenerative disease of nervous system, unspecified: Secondary | ICD-10-CM | POA: Diagnosis not present

## 2018-10-22 ENCOUNTER — Telehealth (HOSPITAL_COMMUNITY): Payer: Self-pay | Admitting: Licensed Clinical Social Worker

## 2018-10-22 NOTE — Telephone Encounter (Signed)
CSW received call from pt regarding Entresto assistance.  Pt reports that she was getting Entresto through [employer] but that they never called her to schedule a refill.  Looking back on notes pt assistance ended 08/19/18 and pt was supposed to bring in renewal application to clinic.    Pt reports she never brought in application- CSW encouraged her to do so as soon as possible.  Pt to try and bring application to clinic tomorrow - CSW placed a months worth of samples at front desk for pt to hold her over until application is processed  CSW will continue to follow and assist as needed  Jorge Ny, [place] Worker [facility] [phone]

## 2018-10-23 ENCOUNTER — Other Ambulatory Visit (HOSPITAL_COMMUNITY): Payer: Self-pay

## 2018-10-23 MED ORDER — SACUBITRIL-VALSARTAN 49-51 MG PO TABS: 1.0000 | ORAL_TABLET | Freq: Two times a day (BID) | ORAL | 3 refills | Status: DC

## 2018-10-24 ENCOUNTER — Telehealth (HOSPITAL_COMMUNITY): Payer: Self-pay | Admitting: Licensed Clinical Social Worker

## 2018-10-24 NOTE — Telephone Encounter (Signed)
CSW faxed completed [employer] application for [place] assistance in for review- fax confirmation received  Jorge Ny, LCSW Clinical Social Worker [facility] [phone]

## 2018-10-27 ENCOUNTER — Telehealth (HOSPITAL_COMMUNITY): Payer: Self-pay

## 2018-10-27 NOTE — Telephone Encounter (Signed)
Attempted pa for entresto 24-2m through [employer] on cmm. Results state that patient already has pa on file.

## 2018-11-04 ENCOUNTER — Ambulatory Visit: Payer: Self-pay | Admitting: Gastroenterology

## 2018-11-04 ENCOUNTER — Telehealth (HOSPITAL_COMMUNITY): Payer: Self-pay | Admitting: Licensed Clinical Social Worker

## 2018-11-04 DIAGNOSIS — M1990 Unspecified osteoarthritis, unspecified site: Secondary | ICD-10-CM | POA: Diagnosis not present

## 2018-11-04 DIAGNOSIS — J9690 Respiratory failure, unspecified, unspecified whether with hypoxia or hypercapnia: Secondary | ICD-10-CM | POA: Diagnosis not present

## 2018-11-04 DIAGNOSIS — G4733 Obstructive sleep apnea (adult) (pediatric): Secondary | ICD-10-CM | POA: Diagnosis not present

## 2018-11-04 DIAGNOSIS — G4737 Central sleep apnea in conditions classified elsewhere: Secondary | ICD-10-CM | POA: Diagnosis not present

## 2018-11-04 NOTE — Telephone Encounter (Signed)
Notification received that pt has been approved to receive Entresto through the [employer]  Pt ID#: [id number] Expires: 08/20/19  Pt updated  CSW will continue to follow and assist as needed  Jorge Ny, [place] Worker [facility] [phone]

## 2018-11-04 NOTE — Addendum Note (Signed)
Addended by: Hulan Fray on: 11/04/2018 07:13 PM   Modules accepted: Orders

## 2018-11-05 ENCOUNTER — Telehealth: Payer: Self-pay | Admitting: Neurology

## 2018-11-05 NOTE — Telephone Encounter (Signed)
MRI brain without contrast done at [facility] 10/21/2018:  No acute intracranial abnormality. Moderate chronic microvascular disease. There is note of moderate medial temporal lobe hippocampi atrophy out of proportion to the remainder of mild cerebral atrophy.

## 2018-11-10 ENCOUNTER — Other Ambulatory Visit (HOSPITAL_COMMUNITY): Payer: Self-pay | Admitting: Psychiatry

## 2018-11-10 DIAGNOSIS — F330 Major depressive disorder, recurrent, mild: Secondary | ICD-10-CM

## 2018-11-11 ENCOUNTER — Other Ambulatory Visit: Payer: Self-pay

## 2018-11-11 ENCOUNTER — Telehealth (INDEPENDENT_AMBULATORY_CARE_PROVIDER_SITE_OTHER): Payer: Medicare HMO | Admitting: Psychiatry

## 2018-11-11 DIAGNOSIS — F330 Major depressive disorder, recurrent, mild: Secondary | ICD-10-CM | POA: Diagnosis not present

## 2018-11-11 DIAGNOSIS — Z79899 Other long term (current) drug therapy: Secondary | ICD-10-CM

## 2018-11-11 DIAGNOSIS — F5101 Primary insomnia: Secondary | ICD-10-CM | POA: Diagnosis not present

## 2018-11-11 DIAGNOSIS — F09 Unspecified mental disorder due to known physiological condition: Secondary | ICD-10-CM | POA: Diagnosis not present

## 2018-11-11 NOTE — Progress Notes (Signed)
Virtual Visit via Telephone Note  I connected with Robin Hardin on 11/11/18 at  4:00 PM EDT by telephone and verified that I am speaking with the correct person using two identifiers.   I discussed the limitations, risks, security and privacy concerns of performing an evaluation and management service by telephone and the availability of in person appointments. I also discussed with the patient that there may be a patient responsible charge related to this service. The patient expressed understanding and agreed to proceed.   Chief complaint: I am doing so-so.  I have been sick and seeing multiple doctors.   History of presenting illness: Patient was evaluated through telephone encounter.  She was last seen in October 2019.  She has been sick lately and seeing cardiology and primary care physician.  Recently she saw a neurologist who did some testing and neuroimaging studies.  She is waiting for these results.  She still has struggle with chronic depression, memory impairment and chronic fatigue.  She tends to easily forgetful but able to report and recall her psychiatric medication.  She is taking trazodone, Abilify and duloxetine which is helping her mood.  I also reviewed her medication list apparently Cymbalta has not in the list.  It is unclear why Cymbalta is not in the list but patient endorses that she is been taking and does not want to stop the Cymbalta.  She denies any suicidal thoughts or homicidal thought.  Wants to continue her current medication.  She does not want to change any psychotropic medication.  She do not recall or reported any side effects of the medication.  Mental status semination; Brief examination was done due to evaluation done on the phone.  Patient appears to be alert and oriented x3.  She denies suicidal thoughts or any homicidal thought.  Her attention and concentration appears to be distracted and she has difficulty recalling names and events.  Her thought  processes goal-directed and there were no delusions or paranoia appears in conversation.  She also denies any psychosis.  She do not recall any side effects including tremors or shakes.  Her insight judgment appears to be adequate.  Assessment and Plan: Major depressive disorder, recurrent.  Primary insomnia.  Cognitive disorder NOS.  Patient appears to be stable on her current medication.  She like to refill her medication.  She admitted trazodone helping her sleep.  I encouraged to keep appointment with neurology for cardiac impairment.  She declined counseling at this time.  I will call trazodone 50 mg at bedtime, Abilify 5 mg daily and duloxetine 60 mg twice a day.  I reviewed the medication list and it appears that Cymbalta has been discontinued but not clear by who.  Patient told that she is taking Cymbalta and like to continue Cymbalta 60 mg twice a day.  Discussed medication side effects and benefits.  I will see her again in 3 months.  Follow Up Instructions:    I discussed the assessment and treatment plan with the patient. The patient was provided an opportunity to ask questions and all were answered. The patient agreed with the plan and demonstrated an understanding of the instructions.   The patient was advised to call back or seek an in-person evaluation if the symptoms worsen or if the condition fails to improve as anticipated.  I provided 20 minutes of non-face-to-face time during this encounter.   Kathlee Nations, MD

## 2018-12-05 DIAGNOSIS — M1990 Unspecified osteoarthritis, unspecified site: Secondary | ICD-10-CM | POA: Diagnosis not present

## 2018-12-05 DIAGNOSIS — G4733 Obstructive sleep apnea (adult) (pediatric): Secondary | ICD-10-CM | POA: Diagnosis not present

## 2018-12-05 DIAGNOSIS — J9690 Respiratory failure, unspecified, unspecified whether with hypoxia or hypercapnia: Secondary | ICD-10-CM | POA: Diagnosis not present

## 2018-12-05 DIAGNOSIS — G4737 Central sleep apnea in conditions classified elsewhere: Secondary | ICD-10-CM | POA: Diagnosis not present

## 2018-12-09 ENCOUNTER — Encounter (HOSPITAL_COMMUNITY): Payer: Self-pay

## 2018-12-09 ENCOUNTER — Telehealth (HOSPITAL_COMMUNITY): Payer: Self-pay | Admitting: Licensed Clinical Social Worker

## 2018-12-09 ENCOUNTER — Other Ambulatory Visit: Payer: Self-pay

## 2018-12-09 ENCOUNTER — Telehealth: Payer: Self-pay

## 2018-12-09 ENCOUNTER — Ambulatory Visit (HOSPITAL_COMMUNITY)
Admission: RE | Admit: 2018-12-09 | Discharge: 2018-12-09 | Disposition: A | Discharge status: Home / Self Care | Payer: Medicare HMO | Source: Ambulatory Visit | Attending: Adult Health | Admitting: Adult Health

## 2018-12-09 DIAGNOSIS — I5042 Chronic combined systolic (congestive) and diastolic (congestive) heart failure: Secondary | ICD-10-CM

## 2018-12-09 DIAGNOSIS — I10 Essential (primary) hypertension: Secondary | ICD-10-CM

## 2018-12-09 DIAGNOSIS — Z7409 Other reduced mobility: Secondary | ICD-10-CM

## 2018-12-09 MED ORDER — SACUBITRIL-VALSARTAN 49-51 MG PO TABS: 1.0000 | ORAL_TABLET | Freq: Two times a day (BID) | ORAL | 3 refills | Status: DC

## 2018-12-09 NOTE — Progress Notes (Signed)
Heart Failure TeleHealth Note  Due to national recommendations of social distancing due to [place] 19, telehealth visit is felt to be most appropriate for this patient at this time.  I discussed the limitations, risks, security and privacy concerns of performing an evaluation and management service by telephone and the availability of in person appointments. I also discussed with the patient that there may be a patient responsible charge related to this service. The patient expressed understanding and agreed to proceed.   IDMichela, Robin Hardin 04/12/1951, MRN [id number]  Location: Home  Provider location: [address] [place] Type of Visit: Established patient   PCP:  Robin Anis, MD  Primary HF: Dr Robin Hardin  Chief Complaint: Diastolic HF follow up   History of Present Illness: Robin Hardin is a 68 y.o. AAF with history of morbid obesity, HTN, and diastolic HF diagnosed in 5521.  She was eventually diagnosed with systolic heart failure by v-gram (09/2012) with an EF approximately 25%.  LHC showed no significant coronary disease.  She also has OSA, unable to tolerate CPAP.  She presents via Psychiatric nurse for a telehealth visit today. Last seen in office January 2020. She was having vertigo at the time. ReDS reading was 28% and orthostatics were negative. Robin Hardin was increased and she was referred to [facility] PT. Overall doing okay. SOB with standing too long. Gets around with motorized scooter mostly. Has some swelling in ankles that improves after taking medications. No orthopnea or PND. No CP. Appetite and energy level okay. Rare dizziness with moving head quickly side to side or turning around too quickly. Taking all medications, but out of Entresto currently. Weight is 260 lbs, down another 17 lbs. She is trying to lose more weight. Limits fluid and salt intake. No BP cuff at home. Staying at home and using instacart for food, but it has been expensive.   Most recent labs:  10/07/2018: creatinine 1.29, K 3.6  Pt denies symptoms of cough, fevers, chills, or new SOB worrisome for COVID 19.    Past Medical History:  Diagnosis Date  . Acute respiratory failure with hypoxia ([place]) 09/26/2012  . Bilateral knee pain   . Cellulitis of lower leg    left  . Chronic combined systolic and diastolic congestive heart failure ([place]) 10/10/2012   2 D echo 09/2012:  Left ventricle: Extremely poor acoustic windows limit study. Overall LVEF appears to be severely depressed. Recomm ordering limited study with contrast to further evaluate LV systolic function. The cavity size was normal. Wall thickness was normal. Doppler parameters are consistent with abnormal left ventricular relaxation (grade 1 diastolic dysfunction).   2 D echo 03/2009 :  Left ventricle: The cavity size was normal. There was mild concentric hypertrophy. Systolic function was mildly reduced. The estimated ejection fraction was in the range of 45% to 50%. Diffuse hypokinesis. Doppler parameters are consistent with abnormal left ventricular relaxation (grade 1 diastolic dysfunction).     . Chronic kidney disease 10/10/2012   Stage 2 with GFR of 64   . Depression   . Gout   . H/O: hysterectomy   . Hyperlipidemia   . Hypertension   . Malignant hypertension 09/26/2012  . Morbid obesity ([place])   . Nonischemic cardiomyopathy ([place])   . Sleep apnea    on cpap  . Unspecified essential hypertension 03/22/2009   Qualifier: Diagnosis of  By: Robin Meres, RN, BSN, Robin Hardin     No past surgical history on file.  Current Outpatient Medications  Medication Sig Dispense Refill  . aspirin 81 MG EC tablet Take 1 tablet (81 mg total) by mouth daily. 90 tablet 3  . carvedilol (COREG) 25 MG tablet Take 1 tablet (25 mg total) by mouth 2 (two) times daily with a meal. 180 tablet 3  . furosemide (LASIX) 40 MG tablet Take 1 tablet (40 mg total) by mouth daily. (Patient taking differently: Take 20 mg by mouth daily. ) 30 tablet 3  . potassium  chloride SA (K-DUR,KLOR-CON) 20 MEQ tablet 20 mEq once.     . sacubitril-valsartan (ENTRESTO) 49-51 MG Take 1 tablet by mouth 2 (two) times daily. 180 tablet 3  . spironolactone (ALDACTONE) 25 MG tablet Take 1 tablet (25 mg total) by mouth daily. 30 tablet 3  . allopurinol (ZYLOPRIM) 300 MG tablet Take 1 tablet (300 mg total) by mouth daily. 90 tablet 3  . ALOE PO Take 1 capsule by mouth 2 (two) times daily.    . ARIPiprazole (ABILIFY) 5 MG tablet TAKE 1 TABLET EVERY DAY 90 tablet 0  . metFORMIN (GLUCOPHAGE) 500 MG tablet Take 1 tablet (500 mg total) by mouth 2 (two) times daily with a meal. 180 tablet 1  . omeprazole (PRILOSEC) 20 MG capsule Take 1 capsule (20 mg total) by mouth daily. 90 capsule 0  . rosuvastatin (CRESTOR) 20 MG tablet Take 1 tablet (20 mg total) by mouth daily. 90 tablet 3  . traZODone (DESYREL) 50 MG tablet Take 1 tablet (50 mg total) by mouth at bedtime. 90 tablet 0   No current facility-administered medications for this encounter.     Allergies:   Patient has no known allergies.   Social History:  The patient  reports that she has never smoked. She has never used smokeless tobacco. She reports that she does not drink alcohol or use drugs.   Family History:  The patient's family history includes Asthma in her brother, brother, and mother; Heart disease in her mother; Stroke in her mother.   ROS:  Please see the history of present illness.   All other systems are personally reviewed and negative.   Exam:  [facility] Call) Lungs: Normal respiratory effort with conversation.  Neuro: Alert & oriented x 3.  Mild ankle edema  Recent Labs: 05/13/2018: TSH 3.170 07/29/2018: ALT 18; Hemoglobin 12.3; Platelets 219 09/10/2018: B Natriuretic Peptide 81.7 10/07/2018: BUN 11; Creatinine, Ser 1.29; Potassium 3.6; Sodium 142  Personally reviewed   Wt Readings from Last 3 Encounters:  09/18/18 122.5 kg (270 lb)  09/10/18 125.9 kg (277 lb 9.6 oz)  07/29/18 129.8 kg (286 lb 3.2  oz)      ASSESSMENT AND PLAN:  1. Chronic systolic CHF: Nonischemic cardiomyopathy.  Last echo 9/19 with EF 40-45%. Out of range for ICD.   - NYHA class III.  She is very inactive, but this seems more due to orthopedic issues and deconditioning than CHF.   - Volume stable to dry. Down another 17 lbs, but still has some ankle edema. Will get TRN RN to check vest reading on her this week.  - Continue Lasix 20 mg daily for now.  - Continue spironolactone 25 mg daily. - Continue Entresto 49/51 bid.  - Continue Coreg 25 mg BID - Referred for home PT. She never heard from them. Re-refer today. 2. OSA: Has not tolerated CPAP.  No change.  3. Obesity: Weight has come down some.  She gets very little exercise but has been trying to watch her  diet.  No change.  4. Hyperlipidemia: Continue Crestor.   No change.  5. Depression: She has a psychiatrist. Think this plays a major role in her symptomatology. No change. 6. Memory loss - Neuro following.    COVID screen The patient does not have any symptoms that suggest any further testing/ screening at this time.  Social distancing reinforced today.   Patient Risk: After full review of this patients clinical status, I feel that they are at moderate risk for cardiac decompensation at this time.  Orders/Follow up: Refill Entresto - she usually gets mailed through company. Refer to CSW for food. Re-refer for [facility] PT. Check ReDS reading this week through [facility]. Follow up in 3 months with Dr Robin Hardin.  Today, I have spent 13 minutes with the patient with telehealth technology discussing the above issues.     Signed, Georgiana Shore, NP  12/09/2018 11:42 AM   [facility] [address] [place] [address] [phone] (office) [phone] (fax)

## 2018-12-09 NOTE — Patient Instructions (Addendum)
REFILLED Entresto to [facility] order pharmacy  You have been referred to Heart Failure social work. They will be getting in contact with you in order to set up an appointment.  You have been referred to home health physical therapy. They will contact you to set up appointments.  You have been referred to our [place]. She will be coming to you home in order to preform a reds vest reading on you. She will be in contact with you in order to set this up  Please follow up with Dr. Aundra Dubin in 3 months. This is scheduled for July 28th at 11:00am.

## 2018-12-09 NOTE — Telephone Encounter (Signed)
CSW consulted to assess medication concerns and food insecurity.  CSW called pt who reports she never received a shipment from [employer] of her [name].  CSW had informed pt of approval from foundation back in march and that if she did not receive a call from [employer] to set up initial shipment she needed to call them to set up.  Pt reports she tried calling them but kept getting a recording.  CSW informed pt she needed to call foundation again and only press options to speak with a live representative so she could request her initial prescription.  CSW also discussed pt current food concerns.  Pt reports she has been using instacart to purchase groceries and have them delivered to her home- states this has been working well but has become expensive and has lead to her being unable to purchase everything she needs.  CSW discussed [employer] meal delivery program with patient which will temporarily deliver 7 pre-prepared heart healthy meals/week to patient.  Patient is agreeable to program and expresses understanding that delivery person would set meals on front porch and there would be no face to face contact.  CSW will continue to follow and assist as needed  Jorge Ny, [place] [facility] Desk#: [phone] Cell#: [phone]

## 2018-12-09 NOTE — Addendum Note (Signed)
Encounter addended by: Marlise Eves, RN on: 12/09/2018 12:14 PM  Actions taken: Pharmacy for encounter modified, Order list changed, Diagnosis association updated, Clinical Note Signed

## 2018-12-09 NOTE — Telephone Encounter (Signed)
Spoke to Robin Hardin about coming for a home visit to obtain a ReDS clip reading and vital signs as requested by the HF clinic.  Agreed to the time of 1pm on 12/10/18.

## 2018-12-09 NOTE — Progress Notes (Signed)
Spoke to pt to review after visit summary. Pt agreeable to all referral. Denies other refill needs besides entresto. F/u appointment made. mychart message sent.

## 2018-12-10 ENCOUNTER — Telehealth (HOSPITAL_COMMUNITY): Payer: Self-pay | Admitting: Cardiology

## 2018-12-10 ENCOUNTER — Telehealth (HOSPITAL_COMMUNITY): Payer: Self-pay | Admitting: Licensed Clinical Social Worker

## 2018-12-10 NOTE — Telephone Encounter (Signed)
CSW called patient to see if everything went well with ordering medications from [employer].  Pt reports she did not call yesterday to order medication.  CSW called [employer] and then added patient to [facility] confirmed patient information and is shipping out the medication- anticipated delivery date is Friday 4/24.  CSW will continue to follow and assist as needed  Jorge Ny, [place] [facility] Desk#: [phone] Cell#: [phone]

## 2018-12-10 NOTE — Progress Notes (Signed)
[name]    DOB: 1951-04-07  Purpose of Visit:  ReDS clip and Vitals HF provider: Aundra Dubin   Medications: Is the patient taking all medications listed on MAR from Epic? No.  States she's unable to tolerate her aripiprazole 64m Qday, makes her nauseated & stopped it about 2 weeks ago but forgot to let Dr. AAdele Schilderabout it.  Also unable to get another med by Dr. AAdele Schilderas the pharmacy told her there are no more refills but states she will contact Dr. AAdele Schilderabout these meds.   List any medications that are not being taken correctly: denies  List any medication refills needed: no  Is the patient able to pick up medications? Has them delivered  Vitals: BP: ? 118/palp.  Difficult to obtain  HR: 70  Oxygen: 97% RA      Weight: 263lb  Physical Exam:  Peripheral edema: yes, non-pitting to BLE's  Wounds: no    ReDS Vest/Clip Reading: 35%  Rhythm Strip: N/A  Is Home Health recommended? No If yes, state reason:    [facility]recommendation for pt to take an extra lasix 266mtoday.  Pt states understanding.    DeVanita InglesRN 12/10/18

## 2018-12-10 NOTE — Telephone Encounter (Signed)
Contacted by Vanita Ingles, RN with [facility]. I requested ReDS reading in setting of 17 lb weight loss. Her ReDS reading is 35% today, which is on high end of normal (normal 25-35). She has some nonpitting edema in BLE. Instructed her to take an additional 20 mg lasix today, but should remain on her lower dose of lasix 20 mg daily. I think fluid should be well controlled once her Entresto refill comes in the mail. She should be getting it Friday per HF CSW note.   Georgiana Shore, NP

## 2018-12-25 ENCOUNTER — Other Ambulatory Visit (HOSPITAL_COMMUNITY): Payer: Self-pay | Admitting: Psychiatry

## 2018-12-25 DIAGNOSIS — F330 Major depressive disorder, recurrent, mild: Secondary | ICD-10-CM

## 2018-12-25 DIAGNOSIS — F5101 Primary insomnia: Secondary | ICD-10-CM

## 2018-12-26 ENCOUNTER — Telehealth (HOSPITAL_COMMUNITY): Payer: Self-pay | Admitting: Licensed Clinical Social Worker

## 2018-12-26 ENCOUNTER — Other Ambulatory Visit (HOSPITAL_COMMUNITY): Payer: Self-pay | Admitting: Psychiatry

## 2018-12-26 DIAGNOSIS — F330 Major depressive disorder, recurrent, mild: Secondary | ICD-10-CM

## 2018-12-26 DIAGNOSIS — F5101 Primary insomnia: Secondary | ICD-10-CM

## 2018-12-26 MED ORDER — TRAZODONE HCL 50 MG PO TABS: 50.0000 mg | ORAL_TABLET | Freq: Every day | ORAL | 0 refills | Status: DC

## 2018-12-26 MED ORDER — DULOXETINE HCL 30 MG PO CPEP: 30.0000 mg | ORAL_CAPSULE | Freq: Every day | ORAL | 0 refills | Status: DC

## 2018-12-26 NOTE — Progress Notes (Signed)
We will refill her trazodone.  She reported that she is taking Cymbalta but refills were denied by PCP.  We will start Cymbalta 30 mg daily and I called her prescription to [facility].

## 2018-12-26 NOTE — Telephone Encounter (Signed)
CSW contacted patient to follow up on weekly food delivery package. Patient informed of delivery time and no face to face contact during delivery. Message left as no answer.  CSW continues to follow for supportive needs. Robin Hardin, [place], [place]

## 2019-01-02 ENCOUNTER — Telehealth (HOSPITAL_COMMUNITY): Payer: Self-pay | Admitting: Licensed Clinical Social Worker

## 2019-01-02 NOTE — Telephone Encounter (Signed)
CSW contacted patient to follow up on weekly food delivery package. Patient informed of delivery time and no face to face contact during delivery. Patient verbalizes understanding and grateful for the assistance.  CSW continues to follow for supportive needs. Jackie Aquarius Tremper, LCSW, CCSW-MCS [phone]  

## 2019-01-04 DIAGNOSIS — J9690 Respiratory failure, unspecified, unspecified whether with hypoxia or hypercapnia: Secondary | ICD-10-CM | POA: Diagnosis not present

## 2019-01-04 DIAGNOSIS — M1990 Unspecified osteoarthritis, unspecified site: Secondary | ICD-10-CM | POA: Diagnosis not present

## 2019-01-04 DIAGNOSIS — G4737 Central sleep apnea in conditions classified elsewhere: Secondary | ICD-10-CM | POA: Diagnosis not present

## 2019-01-04 DIAGNOSIS — G4733 Obstructive sleep apnea (adult) (pediatric): Secondary | ICD-10-CM | POA: Diagnosis not present

## 2019-01-05 ENCOUNTER — Other Ambulatory Visit: Payer: Self-pay | Admitting: Internal Medicine

## 2019-01-05 DIAGNOSIS — R110 Nausea: Secondary | ICD-10-CM

## 2019-01-05 DIAGNOSIS — I10 Essential (primary) hypertension: Secondary | ICD-10-CM

## 2019-01-08 ENCOUNTER — Telehealth (HOSPITAL_COMMUNITY): Payer: Self-pay | Admitting: Licensed Clinical Social Worker

## 2019-01-08 NOTE — Telephone Encounter (Signed)
CSW contacted patient to follow up on weekly food delivery package. Patient informed of delivery time and no face to face contact during delivery. Message left as no answer.  CSW continues to follow for supportive needs. Raquel Sarna, [place], [place]

## 2019-01-16 ENCOUNTER — Telehealth (HOSPITAL_COMMUNITY): Payer: Self-pay | Admitting: Licensed Clinical Social Worker

## 2019-01-16 NOTE — Telephone Encounter (Signed)
CSW contacted patient to follow up on weekly food delivery package. Patient informed of no face to face contact during delivery. CSW discussed transition option for food delivery as the Covid 19 Food relief program will be ending on January 30, 2019. Patient verbalizes understanding and grateful for the assistance.  CSW continues to follow for supportive needs.  Jorge Ny, LCSW Clinical Social Worker [facility] Desk#: [phone] Cell#: [phone]

## 2019-01-22 ENCOUNTER — Telehealth (HOSPITAL_COMMUNITY): Payer: Self-pay | Admitting: Licensed Clinical Social Worker

## 2019-01-22 NOTE — Telephone Encounter (Signed)
CSW contacted patient to follow up on weekly food delivery package. Patient informed of delivery time and no face to face contact during delivery. CSW shared transition option for food delivery as the Covid 19 Food relief program will be ending on January 30, 2019. Message left as no answer.  CSW continues to follow for supportive needs.   Robin Meader H. Dunbar Buras, LCSW Clinical Social Worker [facility] Desk#: [phone] Cell#: [phone]   

## 2019-02-04 DIAGNOSIS — G4733 Obstructive sleep apnea (adult) (pediatric): Secondary | ICD-10-CM | POA: Diagnosis not present

## 2019-02-04 DIAGNOSIS — G4737 Central sleep apnea in conditions classified elsewhere: Secondary | ICD-10-CM | POA: Diagnosis not present

## 2019-02-04 DIAGNOSIS — J9690 Respiratory failure, unspecified, unspecified whether with hypoxia or hypercapnia: Secondary | ICD-10-CM | POA: Diagnosis not present

## 2019-02-04 DIAGNOSIS — M1990 Unspecified osteoarthritis, unspecified site: Secondary | ICD-10-CM | POA: Diagnosis not present

## 2019-02-10 ENCOUNTER — Ambulatory Visit: Payer: Medicare HMO

## 2019-02-18 ENCOUNTER — Other Ambulatory Visit: Payer: Self-pay | Admitting: Internal Medicine

## 2019-02-26 ENCOUNTER — Other Ambulatory Visit (HOSPITAL_COMMUNITY): Payer: Self-pay

## 2019-02-26 ENCOUNTER — Telehealth (HOSPITAL_COMMUNITY): Payer: Self-pay | Admitting: Licensed Clinical Social Worker

## 2019-02-26 DIAGNOSIS — I10 Essential (primary) hypertension: Secondary | ICD-10-CM

## 2019-02-26 MED ORDER — SPIRONOLACTONE 25 MG PO TABS: 25.0000 mg | ORAL_TABLET | Freq: Every day | ORAL | 3 refills | Status: DC

## 2019-02-26 NOTE — Telephone Encounter (Signed)
CSW received voicemail from pt requesting help with getting a refill of her spirolactone.  Clinic RN sent in refill to pt pharmacy and CSW called pt to inform that it has been sent in- no answer CSW left message informing.  CSW will continue to follow and assist as needed  Jorge Ny, [place] [facility] Desk#: [phone] Cell#: [phone]

## 2019-03-03 ENCOUNTER — Encounter: Payer: Self-pay | Admitting: Internal Medicine

## 2019-03-03 ENCOUNTER — Ambulatory Visit (INDEPENDENT_AMBULATORY_CARE_PROVIDER_SITE_OTHER): Payer: Medicare HMO | Admitting: Internal Medicine

## 2019-03-03 ENCOUNTER — Other Ambulatory Visit: Payer: Self-pay

## 2019-03-03 VITALS — BP 127/87 | HR 66 | Temp 98.6°F | Ht 63.0 in | Wt 268.2 lb

## 2019-03-03 DIAGNOSIS — Z6841 Body Mass Index (BMI) 40.0 and over, adult: Secondary | ICD-10-CM | POA: Diagnosis not present

## 2019-03-03 DIAGNOSIS — F330 Major depressive disorder, recurrent, mild: Secondary | ICD-10-CM

## 2019-03-03 DIAGNOSIS — Z79899 Other long term (current) drug therapy: Secondary | ICD-10-CM | POA: Diagnosis not present

## 2019-03-03 DIAGNOSIS — R110 Nausea: Secondary | ICD-10-CM

## 2019-03-03 DIAGNOSIS — F329 Major depressive disorder, single episode, unspecified: Secondary | ICD-10-CM

## 2019-03-03 DIAGNOSIS — G4733 Obstructive sleep apnea (adult) (pediatric): Secondary | ICD-10-CM

## 2019-03-03 DIAGNOSIS — I10 Essential (primary) hypertension: Secondary | ICD-10-CM

## 2019-03-03 DIAGNOSIS — I5042 Chronic combined systolic (congestive) and diastolic (congestive) heart failure: Secondary | ICD-10-CM

## 2019-03-03 DIAGNOSIS — N183 Chronic kidney disease, stage 3 (moderate): Secondary | ICD-10-CM | POA: Diagnosis not present

## 2019-03-03 DIAGNOSIS — Z8639 Personal history of other endocrine, nutritional and metabolic disease: Secondary | ICD-10-CM

## 2019-03-03 DIAGNOSIS — I130 Hypertensive heart and chronic kidney disease with heart failure and stage 1 through stage 4 chronic kidney disease, or unspecified chronic kidney disease: Secondary | ICD-10-CM | POA: Diagnosis not present

## 2019-03-03 DIAGNOSIS — F332 Major depressive disorder, recurrent severe without psychotic features: Secondary | ICD-10-CM

## 2019-03-03 DIAGNOSIS — F5101 Primary insomnia: Secondary | ICD-10-CM

## 2019-03-03 DIAGNOSIS — E785 Hyperlipidemia, unspecified: Secondary | ICD-10-CM | POA: Diagnosis not present

## 2019-03-03 NOTE — Progress Notes (Signed)
CC: HTN  HPI: RobinRobin Hardin is a 68 y.o. F w/ PMH of morbid obesity, HFmrEF, HTN, CKD3, OSA, Depression and HLD presenting to the clinic for management of her chronic conditions.  Robin Hardin states she has no complaints at this visit.  She states that she has been doing very well after changing her antidepressant regimen per Dr. Roselyn Meier.  She mentions that she had difficulty tolerating Abilify due to nausea and is currently on duloxetine. She has also started speaking with a therapist and has had significant improvement in her mood and fatigue. She noted intermittent increase in her lower extremity swelling which she has been treating with higher doses of Lasix as needed per her cardiologist's instructions. Currently she complains of no lower extremity edema or dyspnea. Requesting refills on her home medications.  Past Medical History:  Diagnosis Date  . Acute respiratory failure with hypoxia ([place]) 09/26/2012  . Bilateral knee pain   . Cellulitis of lower leg    left  . Chronic combined systolic and diastolic congestive heart failure ([place]) 10/10/2012   2 D echo 09/2012:  Left ventricle: Extremely poor acoustic windows limit study. Overall LVEF appears to be severely depressed. Recomm ordering limited study with contrast to further evaluate LV systolic function. The cavity size was normal. Wall thickness was normal. Doppler parameters are consistent with abnormal left ventricular relaxation (grade 1 diastolic dysfunction).   2 D echo 03/2009 :  Left ventricle: The cavity size was normal. There was mild concentric hypertrophy. Systolic function was mildly reduced. The estimated ejection fraction was in the range of 45% to 50%. Diffuse hypokinesis. Doppler parameters are consistent with abnormal left ventricular relaxation (grade 1 diastolic dysfunction).     . Chronic kidney disease 10/10/2012   Stage 2 with GFR of 64   . Depression   . Gout   . H/O: hysterectomy   . Hyperlipidemia   .  Hypertension   . Malignant hypertension 09/26/2012  . Morbid obesity ([place])   . Nonischemic cardiomyopathy ([place])   . Sleep apnea    on cpap  . Unspecified essential hypertension 03/22/2009   Qualifier: Diagnosis of  By: Karrie Meres, RN, BSN, [name]     Review of Systems: Review of Systems  Constitutional: Positive for weight loss (intentional). Negative for chills, fever and malaise/fatigue.  Respiratory: Negative for shortness of breath.   Cardiovascular: Positive for leg swelling. Negative for chest pain and palpitations.  Gastrointestinal: Negative for constipation, diarrhea, nausea and vomiting.  Neurological: Negative for headaches.  All other systems reviewed and are negative.    Physical Exam: Vitals:   03/03/19 1502  BP: 127/87  Pulse: 66  Temp: 98.6 F (37 C)  TempSrc: Oral  SpO2: 98%  Weight: 268 lb 3.2 oz (121.7 kg)  Height: _0  (1.6 m)   Physical Exam  Constitutional: She is oriented to person, place, and time. She appears well-developed and well-nourished.  Obese  Eyes: Conjunctivae are normal. No scleral icterus.  Cardiovascular: Normal rate, regular rhythm, normal heart sounds and intact distal pulses.  Respiratory: Effort normal and breath sounds normal. She has no wheezes. She has no rales.  GI: Soft. Bowel sounds are normal. She exhibits no distension. There is no abdominal tenderness.  Musculoskeletal: Normal range of motion.        General: Edema (minimal trace pitting edema) present.  Neurological: She is alert and oriented to person, place, and time.  Skin: Skin is warm and dry.     Assessment &  Plan:   Morbid obesity with BMI of 50.0-59.9, adult ([place]) Pt requires refills on medications with associated diagnosis above.  Reviewed disease process and find this medication to be necessary, will not change dose or alter current therapy.  Chronic combined systolic and diastolic congestive heart failure (HCC) Presents with intermittent episodes of lower  extremity edema well managed with furosemide 20-40 daily. Follows with Dr.McLean at CHF clinic. On exam minimal lower extremity edema. Discussed importance of low sodium intake and adherence to home meds. Currently weight 268lb (last visit 280)  - C/w current regimen: Entresto, spironolactone, carvedilol, furosemide. - BMP   Essential hypertension BP Readings from Last 3 Encounters:  03/03/19 127/87  09/18/18 130/76  09/10/18 125/86   Acceptable range bp this visit. Denies any chest pain, palpitations, dyspnea, headache  - C/w coreg, spironolactone, Entresto, lasix  Depression Pt requires refills on medications with associated diagnosis above.  Reviewed disease process and find this medication to be necessary, will not change dose or alter current therapy.  Hyperlipidemia Pt requires refills on medications with associated diagnosis above.  Reviewed disease process and find this medication to be necessary, will not change dose or alter current therapy.    Patient discussed with Dr. Rebeca Alert   -Robin Hardin, PGY2 [facility] Pager: [phone]

## 2019-03-03 NOTE — Patient Instructions (Signed)
Thank you for allowing [place] to provide your care today. Today we discussed your heart failure    I have ordered bmp labs for you. I will call if any are abnormal.    Today we made no changes to your medications.    Please follow-up in 6 months.    Should you have any questions or concerns please call the internal medicine clinic at [phone].    Low-Sodium Eating Plan Sodium, which is an element that makes up salt, helps you maintain a healthy balance of fluids in your body. Too much sodium can increase your blood pressure and cause fluid and waste to be held in your body. Your health care provider or dietitian may recommend following this plan if you have high blood pressure (hypertension), kidney disease, liver disease, or heart failure. Eating less sodium can help lower your blood pressure, reduce swelling, and protect your heart, liver, and kidneys. What are tips for following this plan? General guidelines  Most people on this plan should limit their sodium intake to 1,500-2,000 mg (milligrams) of sodium each day. Reading food labels   The Nutrition Facts label lists the amount of sodium in one serving of the food. If you eat more than one serving, you must multiply the listed amount of sodium by the number of servings.  Choose foods with less than 140 mg of sodium per serving.  Avoid foods with 300 mg of sodium or more per serving. Shopping  Look for lower-sodium products, often labeled as "low-sodium" or "no salt added."  Always check the sodium content even if foods are labeled as "unsalted" or "no salt added".  Buy fresh foods. ? Avoid canned foods and premade or frozen meals. ? Avoid canned, cured, or processed meats  Buy breads that have less than 80 mg of sodium per slice. Cooking  Eat more home-cooked food and less restaurant, buffet, and fast food.  Avoid adding salt when cooking. Use salt-free seasonings or herbs instead of table salt or sea salt. Check with  your health care provider or pharmacist before using salt substitutes.  Cook with plant-based oils, such as canola, sunflower, or olive oil. Meal planning  When eating at a restaurant, ask that your food be prepared with less salt or no salt, if possible.  Avoid foods that contain MSG (monosodium glutamate). MSG is sometimes added to [place] food, bouillon, and some canned foods. What foods are recommended? The items listed may not be a complete list. Talk with your dietitian about what dietary choices are best for you. Grains Low-sodium cereals, including oats, puffed wheat and rice, and shredded wheat. Low-sodium crackers. Unsalted rice. Unsalted pasta. Low-sodium bread. Whole-grain breads and whole-grain pasta. Vegetables Fresh or frozen vegetables. "No salt added" canned vegetables. "No salt added" tomato sauce and paste. Low-sodium or reduced-sodium tomato and vegetable juice. Fruits Fresh, frozen, or canned fruit. Fruit juice. Meats and other protein foods Fresh or frozen (no salt added) meat, poultry, seafood, and fish. Low-sodium canned tuna and salmon. Unsalted nuts. Dried peas, beans, and lentils without added salt. Unsalted canned beans. Eggs. Unsalted nut butters. Dairy Milk. Soy milk. Cheese that is naturally low in sodium, such as ricotta cheese, fresh mozzarella, or Swiss cheese Low-sodium or reduced-sodium cheese. Cream cheese. Yogurt. Fats and oils Unsalted butter. Unsalted margarine with no trans fat. Vegetable oils such as canola or olive oils. Seasonings and other foods Fresh and dried herbs and spices. Salt-free seasonings. Low-sodium mustard and ketchup. Sodium-free salad dressing. Sodium-free light mayonnaise.  Fresh or refrigerated horseradish. Lemon juice. Vinegar. Homemade, reduced-sodium, or low-sodium soups. Unsalted popcorn and pretzels. Low-salt or salt-free chips. What foods are not recommended? The items listed may not be a complete list. Talk with your  dietitian about what dietary choices are best for you. Grains Instant hot cereals. Bread stuffing, pancake, and biscuit mixes. Croutons. Seasoned rice or pasta mixes. Noodle soup cups. Boxed or frozen macaroni and cheese. Regular salted crackers. Self-rising flour. Vegetables Sauerkraut, pickled vegetables, and relishes. Olives. [place] fries. Onion rings. Regular canned vegetables (not low-sodium or reduced-sodium). Regular canned tomato sauce and paste (not low-sodium or reduced-sodium). Regular tomato and vegetable juice (not low-sodium or reduced-sodium). Frozen vegetables in sauces. Meats and other protein foods Meat or fish that is salted, canned, smoked, spiced, or pickled. Bacon, ham, sausage, hotdogs, corned beef, chipped beef, packaged lunch meats, salt pork, jerky, pickled herring, anchovies, regular canned tuna, sardines, salted nuts. Dairy Processed cheese and cheese spreads. Cheese curds. Blue cheese. Feta cheese. String cheese. Regular cottage cheese. Buttermilk. Canned milk. Fats and oils Salted butter. Regular margarine. Ghee. Bacon fat. Seasonings and other foods Onion salt, garlic salt, seasoned salt, table salt, and sea salt. Canned and packaged gravies. Worcestershire sauce. Tartar sauce. Barbecue sauce. Teriyaki sauce. Soy sauce, including reduced-sodium. Steak sauce. Fish sauce. Oyster sauce. Cocktail sauce. Horseradish that you find on the shelf. Regular ketchup and mustard. Meat flavorings and tenderizers. Bouillon cubes. Hot sauce and Tabasco sauce. Premade or packaged marinades. Premade or packaged taco seasonings. Relishes. Regular salad dressings. Salsa. Potato and tortilla chips. Corn chips and puffs. Salted popcorn and pretzels. Canned or dried soups. Pizza. Frozen entrees and pot pies. Summary  Eating less sodium can help lower your blood pressure, reduce swelling, and protect your heart, liver, and kidneys.  Most people on this plan should limit their sodium intake to  1,500-2,000 mg (milligrams) of sodium each day.  Canned, boxed, and frozen foods are high in sodium. Restaurant foods, fast foods, and pizza are also very high in sodium. You also get sodium by adding salt to food.  Try to cook at home, eat more fresh fruits and vegetables, and eat less fast food, canned, processed, or prepared foods. This information is not intended to replace advice given to you by your health care provider. Make sure you discuss any questions you have with your health care provider. Document Released: 01/26/2002 Document Revised: 07/19/2017 Document Reviewed: 07/30/2016 Elsevier Patient Education  2020 [employer].

## 2019-03-04 LAB — BMP8+ANION GAP
Anion Gap: 19 mmol/L — ABNORMAL HIGH (ref 10.0–18.0)
BUN/Creatinine Ratio: 13 (ref 12–28)
BUN: 20 mg/dL (ref 8–27)
CO2: 25 mmol/L (ref 20–29)
Calcium: 9.4 mg/dL (ref 8.7–10.3)
Chloride: 102 mmol/L (ref 96–106)
Creatinine, Ser: 1.55 mg/dL — ABNORMAL HIGH (ref 0.57–1.00)
GFR calc Af Amer: 40 mL/min/{1.73_m2} — ABNORMAL LOW (ref >59)
GFR calc non Af Amer: 34 mL/min/{1.73_m2} — ABNORMAL LOW (ref >59)
Glucose: 84 mg/dL (ref 65–99)
Potassium: 4.2 mmol/L (ref 3.5–5.2)
Sodium: 146 mmol/L — ABNORMAL HIGH (ref 134–144)

## 2019-03-04 MED ORDER — OMEPRAZOLE 20 MG PO CPDR: 20.0000 mg | DELAYED_RELEASE_CAPSULE | Freq: Every day | ORAL | 0 refills | Status: DC

## 2019-03-04 MED ORDER — ROSUVASTATIN CALCIUM 20 MG PO TABS: 20.0000 mg | ORAL_TABLET | Freq: Every day | ORAL | 3 refills | Status: DC

## 2019-03-04 MED ORDER — SPIRONOLACTONE 25 MG PO TABS: 25.0000 mg | ORAL_TABLET | Freq: Every day | ORAL | 3 refills | Status: DC

## 2019-03-04 MED ORDER — TRAZODONE HCL 50 MG PO TABS: 50.0000 mg | ORAL_TABLET | Freq: Every day | ORAL | 0 refills | Status: DC

## 2019-03-04 MED ORDER — ALLOPURINOL 300 MG PO TABS: 300.0000 mg | ORAL_TABLET | Freq: Every day | ORAL | 3 refills | Status: DC

## 2019-03-04 MED ORDER — FUROSEMIDE 40 MG PO TABS: 20.0000 mg | ORAL_TABLET | Freq: Every day | ORAL | 2 refills | Status: DC

## 2019-03-04 MED ORDER — METFORMIN HCL 500 MG PO TABS: 500.0000 mg | ORAL_TABLET | Freq: Two times a day (BID) | ORAL | 1 refills | Status: DC

## 2019-03-04 MED ORDER — ENTRESTO 49-51 MG PO TABS: 1.0000 | ORAL_TABLET | Freq: Two times a day (BID) | ORAL | 3 refills | Status: AC

## 2019-03-04 MED ORDER — CARVEDILOL 25 MG PO TABS: 25.0000 mg | ORAL_TABLET | Freq: Two times a day (BID) | ORAL | 3 refills | Status: DC

## 2019-03-04 MED ORDER — ASPIRIN 81 MG PO TBEC: 81.0000 mg | DELAYED_RELEASE_TABLET | Freq: Every day | ORAL | 3 refills | Status: AC

## 2019-03-04 MED ORDER — DULOXETINE HCL 30 MG PO CPEP: 30.0000 mg | ORAL_CAPSULE | Freq: Every day | ORAL | 0 refills | Status: DC

## 2019-03-06 DIAGNOSIS — G4733 Obstructive sleep apnea (adult) (pediatric): Secondary | ICD-10-CM | POA: Diagnosis not present

## 2019-03-06 DIAGNOSIS — J9690 Respiratory failure, unspecified, unspecified whether with hypoxia or hypercapnia: Secondary | ICD-10-CM | POA: Diagnosis not present

## 2019-03-06 DIAGNOSIS — G4737 Central sleep apnea in conditions classified elsewhere: Secondary | ICD-10-CM | POA: Diagnosis not present

## 2019-03-06 DIAGNOSIS — M1990 Unspecified osteoarthritis, unspecified site: Secondary | ICD-10-CM | POA: Diagnosis not present

## 2019-03-06 NOTE — Assessment & Plan Note (Signed)
Pt requires refills on medications with associated diagnosis above.  Reviewed disease process and find this medication to be necessary, will not change dose or alter current therapy.

## 2019-03-06 NOTE — Progress Notes (Signed)
Internal Medicine Clinic Attending  Case discussed with Dr. Truman [facility] at the time of the visit.  We reviewed the resident's history and exam and pertinent patient test results.  I agree with the assessment, diagnosis, and plan of care documented in the resident's note.  Lenice Pressman, M.D., Ph.D.

## 2019-03-06 NOTE — Assessment & Plan Note (Addendum)
BP Readings from Last 3 Encounters:  03/03/19 127/87  09/18/18 130/76  09/10/18 125/86   Acceptable range bp this visit. Denies any chest pain, palpitations, dyspnea, headache  - C/w coreg, spironolactone, Entresto, lasix

## 2019-03-06 NOTE — Assessment & Plan Note (Addendum)
Presents with intermittent episodes of lower extremity edema well managed with furosemide 20-40 daily. Follows with Dr.McLean at CHF clinic. On exam minimal lower extremity edema. Discussed importance of low sodium intake and adherence to home meds. Currently weight 268lb (last visit 280)  - C/w current regimen: Entresto, spironolactone, carvedilol, furosemide. - BMP

## 2019-03-17 ENCOUNTER — Encounter (HOSPITAL_COMMUNITY): Payer: Self-pay | Admitting: Cardiology

## 2019-03-17 ENCOUNTER — Other Ambulatory Visit: Payer: Self-pay

## 2019-03-17 ENCOUNTER — Ambulatory Visit (HOSPITAL_COMMUNITY)
Admission: RE | Admit: 2019-03-17 | Discharge: 2019-03-17 | Disposition: A | Discharge status: Home / Self Care | Payer: Medicare HMO | Source: Ambulatory Visit | Attending: Cardiology | Admitting: Cardiology

## 2019-03-17 VITALS — BP 122/71 | HR 75 | Wt 271.6 lb

## 2019-03-17 DIAGNOSIS — Z7982 Long term (current) use of aspirin: Secondary | ICD-10-CM | POA: Insufficient documentation

## 2019-03-17 DIAGNOSIS — Z7984 Long term (current) use of oral hypoglycemic drugs: Secondary | ICD-10-CM | POA: Insufficient documentation

## 2019-03-17 DIAGNOSIS — G4733 Obstructive sleep apnea (adult) (pediatric): Secondary | ICD-10-CM | POA: Insufficient documentation

## 2019-03-17 DIAGNOSIS — I5042 Chronic combined systolic (congestive) and diastolic (congestive) heart failure: Secondary | ICD-10-CM

## 2019-03-17 DIAGNOSIS — M109 Gout, unspecified: Secondary | ICD-10-CM | POA: Diagnosis not present

## 2019-03-17 DIAGNOSIS — Z8249 Family history of ischemic heart disease and other diseases of the circulatory system: Secondary | ICD-10-CM | POA: Insufficient documentation

## 2019-03-17 DIAGNOSIS — E1122 Type 2 diabetes mellitus with diabetic chronic kidney disease: Secondary | ICD-10-CM | POA: Diagnosis not present

## 2019-03-17 DIAGNOSIS — I428 Other cardiomyopathies: Secondary | ICD-10-CM | POA: Diagnosis not present

## 2019-03-17 DIAGNOSIS — Z86718 Personal history of other venous thrombosis and embolism: Secondary | ICD-10-CM | POA: Insufficient documentation

## 2019-03-17 DIAGNOSIS — I130 Hypertensive heart and chronic kidney disease with heart failure and stage 1 through stage 4 chronic kidney disease, or unspecified chronic kidney disease: Secondary | ICD-10-CM | POA: Diagnosis not present

## 2019-03-17 DIAGNOSIS — F329 Major depressive disorder, single episode, unspecified: Secondary | ICD-10-CM | POA: Diagnosis not present

## 2019-03-17 DIAGNOSIS — N189 Chronic kidney disease, unspecified: Secondary | ICD-10-CM | POA: Insufficient documentation

## 2019-03-17 DIAGNOSIS — Z79899 Other long term (current) drug therapy: Secondary | ICD-10-CM | POA: Insufficient documentation

## 2019-03-17 DIAGNOSIS — E785 Hyperlipidemia, unspecified: Secondary | ICD-10-CM | POA: Diagnosis not present

## 2019-03-17 DIAGNOSIS — Z888 Allergy status to other drugs, medicaments and biological substances status: Secondary | ICD-10-CM | POA: Insufficient documentation

## 2019-03-17 MED ORDER — FUROSEMIDE 40 MG PO TABS: 40.0000 mg | ORAL_TABLET | Freq: Every day | ORAL | 3 refills | Status: DC

## 2019-03-17 NOTE — Patient Instructions (Addendum)
INCREASE Lasix to 40 mg, one tab daily  Labs needed in 10-14 days 03/30/19 @ 11 AM  Your physician recommends that you schedule a follow-up appointment in: with Dr Aundra Dubin on Tuesday October 27th, 2020 at 1:40p parking code [place]  At the [facility], you and your health needs are our priority. As part of our continuing mission to provide you with exceptional heart care, we have created designated Provider Care Teams. These Care Teams include your primary Cardiologist (physician) and Advanced Practice Providers (APPs- Physician Assistants and Nurse Practitioners) who all work together to provide you with the care you need, when you need it.   You may see any of the following providers on your designated Care Team at your next follow up: Marland Kitchen Dr Glori Bickers . Dr Loralie Champagne . Darrick Grinder, NP

## 2019-03-17 NOTE — Progress Notes (Signed)
[name], female   DOB: November 27, 1950, 68 y.o.   MRN: [id number]    Advanced Heart Failure Clinic Note   Primary Care: Robin Anis, MD Primary Cardiologist: Dr. Aundra Hardin  HPI:  Robin Hardin is a 68 y.o. AAF with history of morbid obesity, HTN, and diastolic HF diagnosed in 2263.  She was eventually diagnosed with systolic heart failure by v-gram (09/2012) with an EF approximately 25%.  LHC showed no significant coronary disease.  She also has OSA with CPAP.   She had stopped taking her fluid pills in Feb 2014 due to cramping and ended up being admitted with respiratory failure.  She was diuresed ~40 pounds.  Her torsemide was changed to lasix to avoid cramping.  Echo showed LVEF 25%. Discharge weight 387 lbs. Echo in 12/14 showed EF 40-45% with mild LV dilation.  Echo in 9/16 showed EF 35-40% with grade II diastolic dysfunction.  She had atypical chest pain in 6/17, Cardiolite at that time showed EF up to 52% with no ischemia/infarction. Most recent echo in 9/19 showed EF 40-45%.    She returns for followup of CHF.  She remains inactive.  She can walk short distances without problems but uses a motorized scooter whenever she leaves the house and has a chair lift to go up her stairs.  No change in her breathing, gets short of breath with moderate activity.  Weight is down 6 lbs.  She sleeps propped up.  She is not really watching dietary sodium.   REDS clip 36%    Labs (7/14): K 3.9 Creatinine 0.91, LDL 105 Labs (06/05/13): K 3.7 Creatinine 0.87 Pro BNP 56  Labs (1/15): K 4, creatinine 0.9 Labs (07/27/14): K 4.2 Creatinine 0.92 Hgb 11.8 HIV NR Labs (5/16): K 5, creatinine 2.57, TSH normal, HCT 36.5 Labs (7/16): K 4, creatinine 1.08, LDL 132 Labs (9/16): K 3.6, creatinine 1.17 Labs (10/16): creatinine 2.19 => 1.34 Labs (11/16): K 4, creatinine 1.0, BNP 114 Labs (5/17): LDL 221, creatinine 2.07 Labs (6/17): K 4, creatinine 1.15 Labs (10/17): LDL 122, HDL 58 Labs (2/18): K  3.7, creatinine 1.28 Labs (3/18): K 4.4, creatinine 1.17 Labs (8/18): TSH normal Labs (9/19): LDL 105 Labs (12/19): K 3.7, creatinine 1.15 Labs (7/20): K 4.2, creatinine 1.55  Past Medical History:  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 She was hospitalized at [facility] in [place] where she had a cath showing normal coronaries but EF 30-35% on LV-gram. She has been hospitalized two other times since then in [place] for CHF. Echo (8/10) showed EF 45-50%, mild global HK, mild LVH, grade I diastolic dysfunction, no significant valvular problems.  Echo (2/14) with EF severely decreased (poor windows so not quantified, appears about 25%).  Echo (12/14) with EF 40-45%, mild LV dilation, moderate LVH, moderate LAE.   - Echo (9/16) with EF 35-40%, grade II diastolic dysfunction, mild MR.  - Lexiscan Cardiolite (6/17) with EF 52%, no ischemia/infarction.  - Echo (9/19): EF 40-45%.  2. HTN  3. Depression  4. Morbid obesity  5. Hyperlipidemia  6. OSA: sporadic with CPAP  7. Gout on allopurinol  8. Bilateral knee pain, probable osteoarthritis  9. Diabetes type II  10. Left lower leg cellulitis in 10/11 with normal [place] for DVT 11. CKD.   Family History:  No FH of premature CAD  asthma: mother, 2 brothers  heart disease: mother  clotting disorders: mother (stroke)   Social History:  Recently from [place]  to [facility].  Married with 2 adopted children.  Never smoked, no ETOH or drugs.  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   Review of Systems  All systems reviewed and negative except as per HPI.   Current Outpatient Medications  Medication Sig Dispense Refill  . allopurinol (ZYLOPRIM) 300 MG tablet Take 1 tablet (300 mg total) by mouth daily. 90 tablet 3  . ALOE PO Take 1 capsule by mouth 2 (two) times daily.    Marland Kitchen aspirin 81 MG EC tablet Take 1 tablet (81 mg total) by mouth daily. 90 tablet 3  . carvedilol (COREG) 25 MG tablet Take 1 tablet  (25 mg total) by mouth 2 (two) times daily with a meal. 180 tablet 3  . DULoxetine (CYMBALTA) 30 MG capsule Take 1 capsule (30 mg total) by mouth daily. 90 capsule 0  . furosemide (LASIX) 40 MG tablet Take 1 tablet (40 mg total) by mouth daily. 90 tablet 3  . metFORMIN (GLUCOPHAGE) 500 MG tablet Take 1 tablet (500 mg total) by mouth 2 (two) times daily with a meal. 180 tablet 1  . omeprazole (PRILOSEC) 20 MG capsule Take 1 capsule (20 mg total) by mouth daily. 90 capsule 0  . rosuvastatin (CRESTOR) 20 MG tablet Take 1 tablet (20 mg total) by mouth daily. 90 tablet 3  . sacubitril-valsartan (ENTRESTO) 49-51 MG Take 1 tablet by mouth 2 (two) times daily. 180 tablet 3  . spironolactone (ALDACTONE) 25 MG tablet Take 1 tablet (25 mg total) by mouth daily. 90 tablet 3  . traMADol (ULTRAM) 50 MG tablet Take by mouth every 12 (twelve) hours as needed for moderate pain.    . traZODone (DESYREL) 50 MG tablet Take 1 tablet (50 mg total) by mouth at bedtime. 90 tablet 0   No current facility-administered medications for this encounter.     Allergies  Allergen Reactions  . Abilify [Aripiprazole] Nausea Only     Vitals:   03/17/19 1059  BP: 122/71  Pulse: 75  SpO2: 98%  Weight: 123.2 kg (271 lb 9.6 oz)   Wt Readings from Last 3 Encounters:  03/17/19 123.2 kg (271 lb 9.6 oz)  03/03/19 121.7 kg (268 lb 3.2 oz)  12/10/18 119.3 kg (263 lb)     PHYSICAL EXAM: General: NAD, obese.  Neck: Thick, JVP difficult, no thyromegaly or thyroid nodule.  Lungs: Clear to auscultation bilaterally with normal respiratory effort. CV: Nondisplaced PMI.  Heart regular S1/S2, no S3/S4, no murmur.  1+ ankle and foot edema.  No carotid bruit.  Normal pedal pulses.  Abdomen: Soft, nontender, no hepatosplenomegaly, no distention.  Skin: Intact without lesions or rashes.  Neurologic: Alert and oriented x 3.  Psych: Normal affect. Extremities: No clubbing or cyanosis.  HEENT: Normal.    ASSESSMENT & PLAN: 1.  Chronic systolic CHF: Nonischemic cardiomyopathy.  Last echo 9/19 with EF 40-45%. Out of range for ICD.  NYHA class III.  She is very inactive, but this seems more due to orthopedic issues and deconditioning than CHF.  Exam is difficult for volume but she has peripheral edema.  REDS clip reading is mildly elevated at 36%.  - Increase Lasix to 40 mg daily.  BMET today and in 10 days.  - Continue spironolactone 25 mg daily.    - Continue Entresto 49/51 bid.  - Continue Coreg 25 mg Bid.  2. OSA: Has not tolerated CPAP.   3. Obesity: Weight has come down some.  She gets very little exercise but  has been trying to watch her diet.   4. Hyperlipidemia: Continue Crestor.    5. Depression: She has a psychiatrist. Think this plays a major role in her symptomatology.   Followup in 3 months                                                              Loralie Champagne 03/17/2019

## 2019-03-17 NOTE — Progress Notes (Signed)
ReDS Vest - 03/17/19 1100      ReDS Vest   MR   No    Fitting Posture  Sitting   station B   Height Marker  Short    Ruler Value  34    [place]  Aligned    ReDS Value  36

## 2019-03-25 ENCOUNTER — Other Ambulatory Visit: Payer: Self-pay

## 2019-03-25 ENCOUNTER — Encounter (HOSPITAL_COMMUNITY): Payer: Self-pay | Admitting: Psychiatry

## 2019-03-25 ENCOUNTER — Ambulatory Visit (INDEPENDENT_AMBULATORY_CARE_PROVIDER_SITE_OTHER): Payer: Medicare HMO | Admitting: Psychiatry

## 2019-03-25 DIAGNOSIS — F09 Unspecified mental disorder due to known physiological condition: Secondary | ICD-10-CM

## 2019-03-25 DIAGNOSIS — F330 Major depressive disorder, recurrent, mild: Secondary | ICD-10-CM

## 2019-03-25 DIAGNOSIS — F419 Anxiety disorder, unspecified: Secondary | ICD-10-CM | POA: Diagnosis not present

## 2019-03-25 NOTE — Progress Notes (Signed)
Virtual Visit via Telephone Note  I connected with Robin Hardin on 03/25/19 at  1:40 PM EDT by telephone and verified that I am speaking with the correct person using two identifiers.   I discussed the limitations, risks, security and privacy concerns of performing an evaluation and management service by telephone and the availability of in person appointments. I also discussed with the patient that there may be a patient responsible charge related to this service. The patient expressed understanding and agreed to proceed.   History of Present Illness: Patient was evaluated through phone session.  She had missed her last appointment but able to get refill from her primary care physician.  She is taking trazodone and Cymbalta 30 mg.  She has a history of memory impairment.  She is no longer taking Abilify and recall it was causing nausea and dizziness.  Overall she feels her medicine is working and she denies any crying spells, irritability, nightmares, flashback or any panic attacks.  She lives with her husband.  We had recommended to neurology for memory impairment.  Patient recall having MRI but do not remember the details.  We do not see any neurology notes in the electronic medical record.  She like to continue current Cymbalta and trazodone.  She used to take Cymbalta 60 mg twice from primary care physician but her new PCP denied and now she is only taking 30 mg.  She has no tremors shakes or any EPS.   Past Psychiatric History: Reviewed and updated. H/O depression and anxiety. No h/o inpatient treatment, psychosis or mania and suicidal attempt. Took Prozac but stopped working. Wellbutrin, Rexulti, celexa, Ambien and abilify did not work. Remeron and Amitriptyline caused weight gain. [place] not approved.   Recent Results (from the past 2160 hour(s))  BMP8+Anion Gap     Status: Abnormal   Collection Time: 03/03/19  4:10 PM  Result Value Ref Range   Glucose 84 65 - 99 mg/dL   BUN 20 8 - 27  mg/dL   Creatinine, Ser 1.55 (H) 0.57 - 1.00 mg/dL   GFR calc non Af Amer 34 (L) >59 mL/min/1.73   GFR calc Af Amer 40 (L) >59 mL/min/1.73   BUN/Creatinine Ratio 13 12 - 28   Sodium 146 (H) 134 - 144 mmol/L   Potassium 4.2 3.5 - 5.2 mmol/L   Chloride 102 96 - 106 mmol/L   CO2 25 20 - 29 mmol/L   Anion Gap 19.0 (H) 10.0 - 18.0 mmol/L   Calcium 9.4 8.7 - 10.3 mg/dL     Psychiatric Specialty Exam: Physical Exam  ROS  There were no vitals taken for this visit.There is no height or weight on file to calculate BMI.  General Appearance: NA  Eye Contact:  NA  Speech:  Slow  Volume:  Decreased  Mood:  Anxious  Affect:  NA  Thought Process:  Descriptions of Associations: Intact  Orientation:  Full (Time, Place, and Person)  Thought Content:  Rumination  Suicidal Thoughts:  No  Homicidal Thoughts:  No  Memory:  Immediate;   Fair Recent;   Fair Remote;   Fair  Judgement:  Fair  Insight:  Fair  Psychomotor Activity:  NA  Concentration:  Concentration: Fair and Attention Span: Fair  Recall:  [employer] of Knowledge:  Fair  Language:  Fair  Akathisia:  No  Handed:  Right  AIMS (if indicated):     Assets:  Desire for Improvement Housing Social Support  ADL's:  Intact  Cognition:  Impaired,  Mild  Sleep:         Assessment and Plan: Major depressive disorder, recurrent.  Anxiety.  Cognitive disorder NOS.  Discussed medication side effects and reminded that she should keep appointment for medication compliance.  I also offer if she wants to continue her psychotropic medication from primary care physician and they are working good then she do not need to see psychiatrist however she insist that she wants to see psychiatrist for future refills.  I will send my notes to her current primary care physician.  Discussed medication side effects and benefits.  Recommended to call us back if she has any question or any concern.  Follow-up in 3 months.  Follow Up Instructions:    I  discussed the assessment and treatment plan with the patient. The patient was provided an opportunity to ask questions and all were answered. The patient agreed with the plan and demonstrated an understanding of the instructions.   The patient was advised to call back or seek an in-person evaluation if the symptoms worsen or if the condition fails to improve as anticipated.  I provided 20 minutes of non-face-to-face time during this encounter.   Kathlee Nations, MD

## 2019-03-30 ENCOUNTER — Other Ambulatory Visit: Payer: Self-pay

## 2019-03-30 ENCOUNTER — Ambulatory Visit (HOSPITAL_COMMUNITY)
Admission: RE | Admit: 2019-03-30 | Discharge: 2019-03-30 | Disposition: A | Discharge status: Home / Self Care | Payer: Medicare HMO | Source: Ambulatory Visit | Attending: Cardiology | Admitting: Cardiology

## 2019-03-30 DIAGNOSIS — I5042 Chronic combined systolic (congestive) and diastolic (congestive) heart failure: Secondary | ICD-10-CM | POA: Insufficient documentation

## 2019-03-30 LAB — BASIC METABOLIC PANEL
Anion gap: 12 (ref 5–15)
BUN: 34 mg/dL — ABNORMAL HIGH (ref 8–23)
CO2: 23 mmol/L (ref 22–32)
Calcium: 9.6 mg/dL (ref 8.9–10.3)
Chloride: 106 mmol/L (ref 98–111)
Creatinine, Ser: 1.88 mg/dL — ABNORMAL HIGH (ref 0.44–1.00)
GFR calc Af Amer: 31 mL/min — ABNORMAL LOW (ref >60)
GFR calc non Af Amer: 27 mL/min — ABNORMAL LOW (ref >60)
Glucose, Bld: 124 mg/dL — ABNORMAL HIGH (ref 70–99)
Potassium: 4.6 mmol/L (ref 3.5–5.1)
Sodium: 141 mmol/L (ref 135–145)

## 2019-03-31 ENCOUNTER — Telehealth (HOSPITAL_COMMUNITY): Payer: Self-pay

## 2019-03-31 DIAGNOSIS — I5042 Chronic combined systolic (congestive) and diastolic (congestive) heart failure: Secondary | ICD-10-CM

## 2019-03-31 NOTE — Telephone Encounter (Signed)
-----  Message from Larey Dresser, MD sent at 03/30/2019  9:34 PM EDT ----- Creatinine is up, decrease Lasix back to 20 mg daily and repeat BMET 10 days.

## 2019-03-31 NOTE — Telephone Encounter (Signed)
LM for patient to return call for results.

## 2019-04-06 ENCOUNTER — Telehealth (HOSPITAL_COMMUNITY): Payer: Self-pay | Admitting: Licensed Clinical Social Worker

## 2019-04-06 DIAGNOSIS — G4733 Obstructive sleep apnea (adult) (pediatric): Secondary | ICD-10-CM | POA: Diagnosis not present

## 2019-04-06 DIAGNOSIS — M1990 Unspecified osteoarthritis, unspecified site: Secondary | ICD-10-CM | POA: Diagnosis not present

## 2019-04-06 DIAGNOSIS — G4737 Central sleep apnea in conditions classified elsewhere: Secondary | ICD-10-CM | POA: Diagnosis not present

## 2019-04-06 DIAGNOSIS — J9690 Respiratory failure, unspecified, unspecified whether with hypoxia or hypercapnia: Secondary | ICD-10-CM | POA: Diagnosis not present

## 2019-04-06 NOTE — Telephone Encounter (Signed)
CSW consulted to speak with pt regarding medication concerns.  CSW called pt who reports she has still not received her Entresto shipment from 3 weeks ago.  States that when she has called [employer] she was told it went to a UPS check pt location at [employer] and was waiting there for her to pick up.  CSW called [employer] and confirmed this then called the [employer] who confirmed the medication is still there and needs to be picked up by 8/20.  CSW updated pt and provided instructions on where the pick up location was- pt will plan to pick up tomorrow on her way to or from her lab appt with [facility]  CSW will continue to follow and assist as needed  Jorge Ny, [facility] Desk#: [phone] Cell#: [phone]

## 2019-04-06 NOTE — Telephone Encounter (Signed)
Spoke with patient, aware office called several times and unable to contact her for lab results from last week.  Spoke with Dr Haroldine Laws, as patient have not made med adjustment per last result, will bring patient in tomorrow for repeat labs and make med adjustment accordingly.  Pt verbalized understanding

## 2019-04-07 ENCOUNTER — Ambulatory Visit (HOSPITAL_COMMUNITY)
Admission: RE | Admit: 2019-04-07 | Discharge: 2019-04-07 | Disposition: A | Discharge status: Home / Self Care | Payer: Medicare HMO | Source: Ambulatory Visit | Attending: Internal Medicine | Admitting: Internal Medicine

## 2019-04-07 ENCOUNTER — Other Ambulatory Visit: Payer: Self-pay

## 2019-04-07 ENCOUNTER — Other Ambulatory Visit (HOSPITAL_COMMUNITY): Payer: Medicare HMO

## 2019-04-07 DIAGNOSIS — I5042 Chronic combined systolic (congestive) and diastolic (congestive) heart failure: Secondary | ICD-10-CM | POA: Insufficient documentation

## 2019-04-07 LAB — BASIC METABOLIC PANEL
Anion gap: 13 (ref 5–15)
BUN: 53 mg/dL — ABNORMAL HIGH (ref 8–23)
CO2: 22 mmol/L (ref 22–32)
Calcium: 10 mg/dL (ref 8.9–10.3)
Chloride: 106 mmol/L (ref 98–111)
Creatinine, Ser: 2.18 mg/dL — ABNORMAL HIGH (ref 0.44–1.00)
GFR calc Af Amer: 26 mL/min — ABNORMAL LOW (ref >60)
GFR calc non Af Amer: 23 mL/min — ABNORMAL LOW (ref >60)
Glucose, Bld: 82 mg/dL (ref 70–99)
Potassium: 4.3 mmol/L (ref 3.5–5.1)
Sodium: 141 mmol/L (ref 135–145)

## 2019-04-08 ENCOUNTER — Telehealth (HOSPITAL_COMMUNITY): Payer: Self-pay

## 2019-04-08 DIAGNOSIS — I5042 Chronic combined systolic (congestive) and diastolic (congestive) heart failure: Secondary | ICD-10-CM

## 2019-04-08 MED ORDER — FUROSEMIDE 40 MG PO TABS: 20.0000 mg | ORAL_TABLET | Freq: Every day | ORAL | 3 refills | Status: DC

## 2019-04-08 NOTE — Telephone Encounter (Signed)
Per Dr Haroldine Laws patient repeated lab work 8/18 due to office unable to contact patient to discuss previous weeks lab work.  Pt creatinine more elevated than previous weeks to 2.18.  Per Dr Haroldine Laws, pt is to hold lasix for 2 days and resume at 89m.  She will repeat labs on 8/28. Discussed all recommendations with patient, she is amenable to plan.

## 2019-04-08 NOTE — Telephone Encounter (Signed)
error

## 2019-04-17 ENCOUNTER — Other Ambulatory Visit: Payer: Self-pay

## 2019-04-17 ENCOUNTER — Ambulatory Visit (HOSPITAL_COMMUNITY)
Admission: RE | Admit: 2019-04-17 | Discharge: 2019-04-17 | Disposition: A | Discharge status: Home / Self Care | Payer: Medicare HMO | Source: Ambulatory Visit | Attending: Cardiology | Admitting: Cardiology

## 2019-04-17 DIAGNOSIS — I5042 Chronic combined systolic (congestive) and diastolic (congestive) heart failure: Secondary | ICD-10-CM | POA: Diagnosis not present

## 2019-04-17 LAB — BASIC METABOLIC PANEL
Anion gap: 11 (ref 5–15)
BUN: 38 mg/dL — ABNORMAL HIGH (ref 8–23)
CO2: 18 mmol/L — ABNORMAL LOW (ref 22–32)
Calcium: 9.8 mg/dL (ref 8.9–10.3)
Chloride: 114 mmol/L — ABNORMAL HIGH (ref 98–111)
Creatinine, Ser: 2.26 mg/dL — ABNORMAL HIGH (ref 0.44–1.00)
GFR calc Af Amer: 25 mL/min — ABNORMAL LOW (ref >60)
GFR calc non Af Amer: 22 mL/min — ABNORMAL LOW (ref >60)
Glucose, Bld: 137 mg/dL — ABNORMAL HIGH (ref 70–99)
Potassium: 4.4 mmol/L (ref 3.5–5.1)
Sodium: 143 mmol/L (ref 135–145)

## 2019-04-22 ENCOUNTER — Telehealth (HOSPITAL_COMMUNITY): Payer: Self-pay

## 2019-04-22 DIAGNOSIS — I5042 Chronic combined systolic (congestive) and diastolic (congestive) heart failure: Secondary | ICD-10-CM

## 2019-04-22 MED ORDER — FUROSEMIDE 40 MG PO TABS: 20.0000 mg | ORAL_TABLET | ORAL | 3 refills | Status: DC

## 2019-04-22 NOTE — Addendum Note (Signed)
Addended by: Valeda Malm on: 04/22/2019 03:21 PM   Modules accepted: Orders

## 2019-04-22 NOTE — Telephone Encounter (Signed)
-----  Message from Larey Dresser, MD sent at 04/17/2019  4:07 PM EDT ----- Decrease Lasix to 20 mg every other day for now.  BMET 10 days.

## 2019-04-22 NOTE — Telephone Encounter (Signed)
(  see Lab result note) Pt did receive message to adjust dose of her lasix to every other day. Pt reports she just didn't call us to make appt. Lab Appt made for 9/9 12:30p. Pt verbalized understanding.

## 2019-04-29 ENCOUNTER — Other Ambulatory Visit: Payer: Self-pay

## 2019-04-29 ENCOUNTER — Other Ambulatory Visit: Payer: Self-pay | Admitting: Internal Medicine

## 2019-04-29 ENCOUNTER — Ambulatory Visit (HOSPITAL_COMMUNITY)
Admission: RE | Admit: 2019-04-29 | Discharge: 2019-04-29 | Disposition: A | Discharge status: Home / Self Care | Payer: Medicare HMO | Source: Ambulatory Visit | Attending: Internal Medicine | Admitting: Internal Medicine

## 2019-04-29 DIAGNOSIS — I5042 Chronic combined systolic (congestive) and diastolic (congestive) heart failure: Secondary | ICD-10-CM | POA: Insufficient documentation

## 2019-04-29 DIAGNOSIS — F330 Major depressive disorder, recurrent, mild: Secondary | ICD-10-CM

## 2019-04-29 LAB — BASIC METABOLIC PANEL
Anion gap: 10 (ref 5–15)
BUN: 34 mg/dL — ABNORMAL HIGH (ref 8–23)
CO2: 18 mmol/L — ABNORMAL LOW (ref 22–32)
Calcium: 9.3 mg/dL (ref 8.9–10.3)
Chloride: 114 mmol/L — ABNORMAL HIGH (ref 98–111)
Creatinine, Ser: 1.77 mg/dL — ABNORMAL HIGH (ref 0.44–1.00)
GFR calc Af Amer: 34 mL/min — ABNORMAL LOW (ref >60)
GFR calc non Af Amer: 29 mL/min — ABNORMAL LOW (ref >60)
Glucose, Bld: 93 mg/dL (ref 70–99)
Potassium: 4.8 mmol/L (ref 3.5–5.1)
Sodium: 142 mmol/L (ref 135–145)

## 2019-05-07 DIAGNOSIS — G4737 Central sleep apnea in conditions classified elsewhere: Secondary | ICD-10-CM | POA: Diagnosis not present

## 2019-05-07 DIAGNOSIS — M1990 Unspecified osteoarthritis, unspecified site: Secondary | ICD-10-CM | POA: Diagnosis not present

## 2019-05-07 DIAGNOSIS — G4733 Obstructive sleep apnea (adult) (pediatric): Secondary | ICD-10-CM | POA: Diagnosis not present

## 2019-05-07 DIAGNOSIS — J9690 Respiratory failure, unspecified, unspecified whether with hypoxia or hypercapnia: Secondary | ICD-10-CM | POA: Diagnosis not present

## 2019-05-13 ENCOUNTER — Ambulatory Visit (HOSPITAL_COMMUNITY)
Admission: RE | Admit: 2019-05-13 | Discharge: 2019-05-13 | Disposition: A | Discharge status: Home / Self Care | Payer: Medicare HMO | Source: Ambulatory Visit | Attending: Internal Medicine | Admitting: Internal Medicine

## 2019-05-13 ENCOUNTER — Other Ambulatory Visit: Payer: Self-pay

## 2019-05-13 ENCOUNTER — Other Ambulatory Visit (HOSPITAL_COMMUNITY): Payer: Self-pay

## 2019-05-13 DIAGNOSIS — I5042 Chronic combined systolic (congestive) and diastolic (congestive) heart failure: Secondary | ICD-10-CM | POA: Diagnosis not present

## 2019-05-13 LAB — BASIC METABOLIC PANEL
Anion gap: 8 (ref 5–15)
BUN: 19 mg/dL (ref 8–23)
CO2: 20 mmol/L — ABNORMAL LOW (ref 22–32)
Calcium: 9 mg/dL (ref 8.9–10.3)
Chloride: 115 mmol/L — ABNORMAL HIGH (ref 98–111)
Creatinine, Ser: 1.41 mg/dL — ABNORMAL HIGH (ref 0.44–1.00)
GFR calc Af Amer: 45 mL/min — ABNORMAL LOW (ref >60)
GFR calc non Af Amer: 38 mL/min — ABNORMAL LOW (ref >60)
Glucose, Bld: 76 mg/dL (ref 70–99)
Potassium: 4.5 mmol/L (ref 3.5–5.1)
Sodium: 143 mmol/L (ref 135–145)

## 2019-05-18 ENCOUNTER — Other Ambulatory Visit: Payer: Self-pay | Admitting: Internal Medicine

## 2019-05-18 ENCOUNTER — Other Ambulatory Visit (HOSPITAL_COMMUNITY): Payer: Self-pay | Admitting: Psychiatry

## 2019-05-18 DIAGNOSIS — Z6841 Body Mass Index (BMI) 40.0 and over, adult: Secondary | ICD-10-CM

## 2019-05-18 DIAGNOSIS — F330 Major depressive disorder, recurrent, mild: Secondary | ICD-10-CM

## 2019-05-26 ENCOUNTER — Encounter (HOSPITAL_COMMUNITY): Payer: Self-pay | Admitting: Psychiatry

## 2019-05-26 ENCOUNTER — Other Ambulatory Visit: Payer: Self-pay

## 2019-05-26 ENCOUNTER — Ambulatory Visit (INDEPENDENT_AMBULATORY_CARE_PROVIDER_SITE_OTHER): Payer: Medicare HMO | Admitting: Psychiatry

## 2019-05-26 ENCOUNTER — Encounter (HOSPITAL_COMMUNITY): Payer: Self-pay

## 2019-05-26 DIAGNOSIS — F419 Anxiety disorder, unspecified: Secondary | ICD-10-CM | POA: Diagnosis not present

## 2019-05-26 DIAGNOSIS — F09 Unspecified mental disorder due to known physiological condition: Secondary | ICD-10-CM

## 2019-05-26 DIAGNOSIS — F330 Major depressive disorder, recurrent, mild: Secondary | ICD-10-CM

## 2019-05-26 MED ORDER — DULOXETINE HCL 60 MG PO CPEP: 60.0000 mg | ORAL_CAPSULE | Freq: Every day | ORAL | 0 refills | Status: DC

## 2019-05-26 MED ORDER — TRAZODONE HCL 100 MG PO TABS: 100.0000 mg | ORAL_TABLET | Freq: Every day | ORAL | 0 refills | Status: DC

## 2019-05-26 NOTE — Progress Notes (Signed)
Virtual Visit via Telephone Note  I connected with Robin Hardin on 05/26/19 at  1:20 PM EDT by telephone and verified that I am speaking with the correct person using two identifiers.   I discussed the limitations, risks, security and privacy concerns of performing an evaluation and management service by telephone and the availability of in person appointments. I also discussed with the patient that there may be a patient responsible charge related to this service. The patient expressed understanding and agreed to proceed.   History of Present Illness: Patient was evaluated by phone session.  Patient endorsed that she has been lately feeling more depressed, sad with lack of fatigue and motivated to do things.  She is to take 60 mg Cymbalta twice a day but she missed appointment with a psychiatrist and her primary care physician started Cymbalta 30 mg 3 months ago.  On her last visit she felt that 30 mg is working very well and did not decided to go back to up the dose of Cymbalta.  Now she is feeling that she need to go up on Cymbalta since she is feeling more sad, depressed and having poor sleep.  She also endorsed chronic Robin impairment.  She has seen neurology but has not set up any follow-up appointment to discuss the MRI results.  She is taking trazodone is also given by primary care physician.  Sometimes she feels the trazodone is not working as she has a hard time going to sleep and keeping the sleep all night.  But she denies any suicidal thoughts or homicidal thought but admitted more stress, anxious nervous and sad.  She is taking care of her husband who has quadruple bypass surgery.  She reported no tremors, shakes or any EPS.  Energy level is low.  She has no paranoia or any hallucination.  She denies any drinking or using any illegal substances.  Recently she had blood work and her creatinine is slightly increased but better than before.  Past Psychiatric History:Reviewed and  updated. H/O depression and anxiety. No h/o inpatient treatment, psychosis or mania and suicidal attempt. Took Prozac but stopped working. Wellbutrin, Rexulti, celexa, Ambien and abilify did not work. Remeron and Amitriptyline caused weight gain. [place] not approved.   Recent Results (from the past 2160 hour(s))  BMP8+Anion Gap     Status: Abnormal   Collection Time: 03/03/19  4:10 PM  Result Value Ref Range   Glucose 84 65 - 99 mg/dL   BUN 20 8 - 27 mg/dL   Creatinine, Ser 1.55 (H) 0.57 - 1.00 mg/dL   GFR calc non Af Amer 34 (L) >59 mL/min/1.73   GFR calc Af Amer 40 (L) >59 mL/min/1.73   BUN/Creatinine Ratio 13 12 - 28   Sodium 146 (H) 134 - 144 mmol/L   Potassium 4.2 3.5 - 5.2 mmol/L   Chloride 102 96 - 106 mmol/L   CO2 25 20 - 29 mmol/L   Anion Gap 19.0 (H) 10.0 - 18.0 mmol/L   Calcium 9.4 8.7 - 10.3 mg/dL  Basic Metabolic Panel (BMET)     Status: Abnormal   Collection Time: 03/30/19 11:56 AM  Result Value Ref Range   Sodium 141 135 - 145 mmol/L   Potassium 4.6 3.5 - 5.1 mmol/L   Chloride 106 98 - 111 mmol/L   CO2 23 22 - 32 mmol/L   Glucose, Bld 124 (H) 70 - 99 mg/dL   BUN 34 (H) 8 - 23 mg/dL   Creatinine, Ser  1.88 (H) 0.44 - 1.00 mg/dL   Calcium 9.6 8.9 - 10.3 mg/dL   GFR calc non Af Amer 27 (L) >60 mL/min   GFR calc Af Amer 31 (L) >60 mL/min   Anion gap 12 5 - 15    Comment: Performed at [facility] [address] [place], [place] [address]  Basic Metabolic Panel (BMET)     Status: Abnormal   Collection Time: 04/07/19 11:24 AM  Result Value Ref Range   Sodium 141 135 - 145 mmol/L   Potassium 4.3 3.5 - 5.1 mmol/L   Chloride 106 98 - 111 mmol/L   CO2 22 22 - 32 mmol/L   Glucose, Bld 82 70 - 99 mg/dL   BUN 53 (H) 8 - 23 mg/dL   Creatinine, Ser 2.18 (H) 0.44 - 1.00 mg/dL   Calcium 10.0 8.9 - 10.3 mg/dL   GFR calc non Af Amer 23 (L) >60 mL/min   GFR calc Af Amer 26 (L) >60 mL/min   Anion gap 13 5 - 15    Comment: Performed at [facility], [place] [address] [place], [place] [address]  Basic metabolic panel     Status: Abnormal   Collection Time: 04/17/19 11:26 AM  Result Value Ref Range   Sodium 143 135 - 145 mmol/L   Potassium 4.4 3.5 - 5.1 mmol/L   Chloride 114 (H) 98 - 111 mmol/L   CO2 18 (L) 22 - 32 mmol/L   Glucose, Bld 137 (H) 70 - 99 mg/dL   BUN 38 (H) 8 - 23 mg/dL   Creatinine, Ser 2.26 (H) 0.44 - 1.00 mg/dL   Calcium 9.8 8.9 - 10.3 mg/dL   GFR calc non Af Amer 22 (L) >60 mL/min   GFR calc Af Amer 25 (L) >60 mL/min   Anion gap 11 5 - 15    Comment: Performed at [facility] [address], [place], [place] [address]  Basic metabolic panel     Status: Abnormal   Collection Time: 04/29/19  1:25 PM  Result Value Ref Range   Sodium 142 135 - 145 mmol/L   Potassium 4.8 3.5 - 5.1 mmol/L   Chloride 114 (H) 98 - 111 mmol/L   CO2 18 (L) 22 - 32 mmol/L   Glucose, Bld 93 70 - 99 mg/dL   BUN 34 (H) 8 - 23 mg/dL   Creatinine, Ser 1.77 (H) 0.44 - 1.00 mg/dL   Calcium 9.3 8.9 - 10.3 mg/dL   GFR calc non Af Amer 29 (L) >60 mL/min   GFR calc Af Amer 34 (L) >60 mL/min   Anion gap 10 5 - 15    Comment: Performed at [facility] [address], [place], [place] [address]  Basic metabolic panel     Status: Abnormal   Collection Time: 05/13/19 11:38 AM  Result Value Ref Range   Sodium 143 135 - 145 mmol/L   Potassium 4.5 3.5 - 5.1 mmol/L   Chloride 115 (H) 98 - 111 mmol/L   CO2 20 (L) 22 - 32 mmol/L   Glucose, Bld 76 70 - 99 mg/dL   BUN 19 8 - 23 mg/dL   Creatinine, Ser 1.41 (H) 0.44 - 1.00 mg/dL   Calcium 9.0 8.9 - 10.3 mg/dL   GFR calc non Af Amer 38 (L) >60 mL/min   GFR calc Af Amer 45 (L) >60 mL/min   Anion gap 8 5 - 15    Comment:  Performed at [facility], [place] [address] [place], [place] [address]       Psychiatric Specialty Exam: Physical Exam  ROS  There were no vitals taken for this visit.There is no height or weight on file to calculate BMI.  General Appearance: NA  Eye Contact:  NA  Speech:  Slow   Volume:  Decreased  Mood:  Anxious, Depressed and Dysphoric  Affect:  NA  Thought Process:  Descriptions of Associations: Intact  Orientation:  Full (Time, Place, and Person)  Thought Content:  Rumination  Suicidal Thoughts:  No  Homicidal Thoughts:  No  Robin:  Immediate;   Fair Recent;   Fair Remote;   Fair  Judgement:  Fair  Insight:  Fair  Psychomotor Activity:  NA  Concentration:  Concentration: Fair and Attention Span: Fair  Recall:  [employer] of Knowledge:  Good  Language:  Good  Akathisia:  No  Handed:  Right  AIMS (if indicated):     Assets:  Communication Skills Desire for Improvement Housing Resilience Social Support  ADL's:  Intact  Cognition:  Impaired,  Mild  Sleep:   fair      Assessment and Plan: Major depressive disorder, recurrent.  Anxiety.  Mild cognitive impairment.  Discussed her current medication.  Recommended to try Cymbalta 60 mg.  Currently she is taking 30 mg only.  She is to take 60 mg twice a day however at this time we will see if the 60 mg Cymbalta works better.  I am concerned about her Robin impairment and I encouraged that she should follow-up with neurology.  I will also increase trazodone to 100 mg at bedtime to help her insomnia.  Discussed medication side effects and benefits.  Patient like to continue follow-up with a psychiatrist to get her medication.  Follow-up in 3 months.  I recommended to call us back if she has any question of any concern.  I also reviewed her blood work.  Her creatinine is slightly increased but better than before.    Follow Up Instructions:    I discussed the assessment and treatment plan with the patient. The patient was provided an opportunity to ask questions and all were answered. The patient agreed with the plan and demonstrated an understanding of the instructions.   The patient was advised to call back or seek an in-person evaluation if the symptoms worsen or if the condition fails to improve as  anticipated.  I provided 20 minutes of non-face-to-face time during this encounter.   Kathlee Nations, MD

## 2019-06-06 DIAGNOSIS — M1990 Unspecified osteoarthritis, unspecified site: Secondary | ICD-10-CM | POA: Diagnosis not present

## 2019-06-06 DIAGNOSIS — G4737 Central sleep apnea in conditions classified elsewhere: Secondary | ICD-10-CM | POA: Diagnosis not present

## 2019-06-06 DIAGNOSIS — G4733 Obstructive sleep apnea (adult) (pediatric): Secondary | ICD-10-CM | POA: Diagnosis not present

## 2019-06-06 DIAGNOSIS — J9690 Respiratory failure, unspecified, unspecified whether with hypoxia or hypercapnia: Secondary | ICD-10-CM | POA: Diagnosis not present

## 2019-06-16 ENCOUNTER — Ambulatory Visit (HOSPITAL_COMMUNITY)
Admission: RE | Admit: 2019-06-16 | Discharge: 2019-06-16 | Disposition: A | Discharge status: Home / Self Care | Payer: Medicare HMO | Source: Ambulatory Visit | Attending: Cardiology | Admitting: Cardiology

## 2019-06-16 ENCOUNTER — Other Ambulatory Visit: Payer: Self-pay

## 2019-06-16 ENCOUNTER — Encounter (HOSPITAL_COMMUNITY): Payer: Self-pay | Admitting: Cardiology

## 2019-06-16 DIAGNOSIS — Z7984 Long term (current) use of oral hypoglycemic drugs: Secondary | ICD-10-CM | POA: Diagnosis not present

## 2019-06-16 DIAGNOSIS — Z7982 Long term (current) use of aspirin: Secondary | ICD-10-CM | POA: Diagnosis not present

## 2019-06-16 DIAGNOSIS — I428 Other cardiomyopathies: Secondary | ICD-10-CM | POA: Diagnosis not present

## 2019-06-16 DIAGNOSIS — F329 Major depressive disorder, single episode, unspecified: Secondary | ICD-10-CM | POA: Diagnosis not present

## 2019-06-16 DIAGNOSIS — G4733 Obstructive sleep apnea (adult) (pediatric): Secondary | ICD-10-CM | POA: Insufficient documentation

## 2019-06-16 DIAGNOSIS — Z79899 Other long term (current) drug therapy: Secondary | ICD-10-CM | POA: Insufficient documentation

## 2019-06-16 DIAGNOSIS — Z8249 Family history of ischemic heart disease and other diseases of the circulatory system: Secondary | ICD-10-CM | POA: Diagnosis not present

## 2019-06-16 DIAGNOSIS — E1122 Type 2 diabetes mellitus with diabetic chronic kidney disease: Secondary | ICD-10-CM | POA: Insufficient documentation

## 2019-06-16 DIAGNOSIS — N189 Chronic kidney disease, unspecified: Secondary | ICD-10-CM | POA: Insufficient documentation

## 2019-06-16 DIAGNOSIS — R0602 Shortness of breath: Secondary | ICD-10-CM | POA: Diagnosis present

## 2019-06-16 DIAGNOSIS — E785 Hyperlipidemia, unspecified: Secondary | ICD-10-CM | POA: Insufficient documentation

## 2019-06-16 DIAGNOSIS — M109 Gout, unspecified: Secondary | ICD-10-CM | POA: Diagnosis not present

## 2019-06-16 DIAGNOSIS — Z86718 Personal history of other venous thrombosis and embolism: Secondary | ICD-10-CM | POA: Insufficient documentation

## 2019-06-16 DIAGNOSIS — I5042 Chronic combined systolic (congestive) and diastolic (congestive) heart failure: Secondary | ICD-10-CM | POA: Insufficient documentation

## 2019-06-16 DIAGNOSIS — I130 Hypertensive heart and chronic kidney disease with heart failure and stage 1 through stage 4 chronic kidney disease, or unspecified chronic kidney disease: Secondary | ICD-10-CM | POA: Diagnosis not present

## 2019-06-16 LAB — BASIC METABOLIC PANEL
Anion gap: 11 (ref 5–15)
BUN: 31 mg/dL — ABNORMAL HIGH (ref 8–23)
CO2: 23 mmol/L (ref 22–32)
Calcium: 9.3 mg/dL (ref 8.9–10.3)
Chloride: 110 mmol/L (ref 98–111)
Creatinine, Ser: 1.77 mg/dL — ABNORMAL HIGH (ref 0.44–1.00)
GFR calc Af Amer: 34 mL/min — ABNORMAL LOW (ref >60)
GFR calc non Af Amer: 29 mL/min — ABNORMAL LOW (ref >60)
Glucose, Bld: 90 mg/dL (ref 70–99)
Potassium: 5.3 mmol/L — ABNORMAL HIGH (ref 3.5–5.1)
Sodium: 144 mmol/L (ref 135–145)

## 2019-06-16 MED ORDER — FUROSEMIDE 40 MG PO TABS: 20.0000 mg | ORAL_TABLET | ORAL | 3 refills | Status: DC

## 2019-06-16 MED ORDER — FUROSEMIDE 40 MG PO TABS: 20.0000 mg | ORAL_TABLET | Freq: Every day | ORAL | 3 refills | Status: DC

## 2019-06-16 NOTE — Patient Instructions (Addendum)
KEEP Lasix at 45m (1/2 tab) every other day  Labs today and repeat in 10 days We will only contact you if something comes back abnormal or we need to make some changes. Otherwise no news is good news!  Your physician has requested that you have an echocardiogram. Echocardiography is a painless test that uses sound waves to create images of your heart. It provides your doctor with information about the size and shape of your heart and how well your heart's chambers and valves are working. This procedure takes approximately one hour. There are no restrictions for this procedure.  Your physician recommends that you schedule a follow-up appointment in: 3 months with an ECHO  At the [facility] you and your health needs are our priority. As part of our continuing mission to provide you with exceptional heart care, we have created designated Provider Care Teams. These Care Teams include your primary Cardiologist (physician) and Advanced Practice Providers (APPs- Physician Assistants and Nurse Practitioners) who all work together to provide you with the care you need, when you need it.   You may see any of the following providers on your designated Care Team at your next follow up: .Marland KitchenDr DGlori Bickers. Dr DLoralie Champagne. ADarrick Grinder NP . BLyda Jester PA   Please be sure to bring in all your medications bottles to every appointment.

## 2019-06-17 NOTE — Progress Notes (Signed)
[name], female   DOB: October 16, 1950, 68 y.o.   MRN: [id number]    Advanced Heart Failure Clinic Note   Primary Care: Mosetta Anis, MD Primary Cardiologist: Dr. Aundra Dubin  HPI:  Robin Hardin is a 68 y.o. AAF with history of morbid obesity, HTN, and diastolic HF diagnosed in 6579.  She was eventually diagnosed with systolic heart failure by v-gram (09/2012) with an EF approximately 25%.  LHC showed no significant coronary disease.  She also has OSA with CPAP.   She had stopped taking her fluid pills in Feb 2014 due to cramping and ended up being admitted with respiratory failure.  She was diuresed ~40 pounds.  Her torsemide was changed to lasix to avoid cramping.  Echo showed LVEF 25%. Discharge weight 387 lbs. Echo in 12/14 showed EF 40-45% with mild LV dilation.  Echo in 9/16 showed EF 35-40% with grade II diastolic dysfunction.  She had atypical chest pain in 6/17, Cardiolite at that time showed EF up to 52% with no ischemia/infarction. Most recent echo in 9/19 showed EF 40-45%.    She returns for followup of CHF.  She remains inactive.  She can walk short distances without problems but uses a motorized scooter whenever she leaves the house and has a chair lift to go up her stairs.  No change in her breathing, gets short of breath with moderate exertion.  She does use a recumbent bike some for exercise.  No orthopnea/PND.  No chest pain.  No lightheadedness. We have backed off on her Lasix recently due to elevated creatinine, most recent creatinine came down to 1.4. Weight is up, but she reports increase eating and less exercise.   REDS clip 33%    Labs (7/14): K 3.9 Creatinine 0.91, LDL 105 Labs (06/05/13): K 3.7 Creatinine 0.87 Pro BNP 56  Labs (1/15): K 4, creatinine 0.9 Labs (07/27/14): K 4.2 Creatinine 0.92 Hgb 11.8 HIV NR Labs (5/16): K 5, creatinine 2.57, TSH normal, HCT 36.5 Labs (7/16): K 4, creatinine 1.08, LDL 132 Labs (9/16): K 3.6, creatinine 1.17 Labs (10/16):  creatinine 2.19 => 1.34 Labs (11/16): K 4, creatinine 1.0, BNP 114 Labs (5/17): LDL 221, creatinine 2.07 Labs (6/17): K 4, creatinine 1.15 Labs (10/17): LDL 122, HDL 58 Labs (2/18): K 3.7, creatinine 1.28 Labs (3/18): K 4.4, creatinine 1.17 Labs (8/18): TSH normal Labs (9/19): LDL 105 Labs (12/19): K 3.7, creatinine 1.15 Labs (7/20): K 4.2, creatinine 1.55 Labs (9/20): K 4.5, creatinine 2.26 => 1.4  Past Medical History:  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 She was hospitalized at [facility] in [place] where she had a cath showing normal coronaries but EF 30-35% on LV-gram. She has been hospitalized two other times since then in [place] for CHF. Echo (8/10) showed EF 45-50%, mild global HK, mild LVH, grade I diastolic dysfunction, no significant valvular problems.  Echo (2/14) with EF severely decreased (poor windows so not quantified, appears about 25%).  Echo (12/14) with EF 40-45%, mild LV dilation, moderate LVH, moderate LAE.   - Echo (9/16) with EF 35-40%, grade II diastolic dysfunction, mild MR.  - Lexiscan Cardiolite (6/17) with EF 52%, no ischemia/infarction.  - Echo (9/19): EF 40-45%.  2. HTN  3. Depression  4. Morbid obesity  5. Hyperlipidemia  6. OSA: sporadic with CPAP  7. Gout on allopurinol  8. Bilateral knee pain, probable osteoarthritis  9. Diabetes type II  10. Left lower leg cellulitis in  10/11 with normal [place] for DVT 11. CKD.   Family History:  No FH of premature CAD  asthma: mother, 2 brothers  heart disease: mother  clotting disorders: mother (stroke)   Social History:  Recently from [place] to [place].  Married with 2 adopted children.  Never smoked, no ETOH or drugs.  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   Review of Systems  All systems reviewed and negative except as per HPI.   Current Outpatient Medications  Medication Sig Dispense Refill  . allopurinol (ZYLOPRIM) 300 MG tablet Take 1 tablet  (300 mg total) by mouth daily. 90 tablet 3  . ALOE PO Take 1 capsule by mouth 2 (two) times daily.    Marland Kitchen aspirin 81 MG EC tablet Take 1 tablet (81 mg total) by mouth daily. 90 tablet 3  . carvedilol (COREG) 25 MG tablet Take 1 tablet (25 mg total) by mouth 2 (two) times daily with a meal. 180 tablet 3  . DULoxetine (CYMBALTA) 60 MG capsule Take 1 capsule (60 mg total) by mouth daily. 90 capsule 0  . furosemide (LASIX) 40 MG tablet Take 0.5 tablets (20 mg total) by mouth every other day. 45 tablet 3  . metFORMIN (GLUCOPHAGE) 500 MG tablet Take 1 tablet (500 mg total) by mouth 2 (two) times daily with a meal. 180 tablet 1  . omeprazole (PRILOSEC) 20 MG capsule TAKE 1 CAPSULE EVERY DAY 90 capsule 0  . potassium chloride SA (KLOR-CON) 20 MEQ tablet Take 20 mEq by mouth daily.     . rosuvastatin (CRESTOR) 20 MG tablet Take 1 tablet (20 mg total) by mouth daily. 90 tablet 3  . sacubitril-valsartan (ENTRESTO) 49-51 MG Take 1 tablet by mouth 2 (two) times daily. 180 tablet 3  . spironolactone (ALDACTONE) 25 MG tablet Take 1 tablet (25 mg total) by mouth daily. 90 tablet 3  . traZODone (DESYREL) 100 MG tablet Take 1 tablet (100 mg total) by mouth at bedtime. 90 tablet 0   No current facility-administered medications for this encounter.     Allergies  Allergen Reactions  . Abilify [Aripiprazole] Nausea Only     Vitals:   06/16/19 1344  BP: 123/74  Pulse: 68  SpO2: 99%  Weight: 130 kg (286 lb 9.6 oz)   Wt Readings from Last 3 Encounters:  06/16/19 130 kg (286 lb 9.6 oz)  03/17/19 123.2 kg (271 lb 9.6 oz)  03/03/19 121.7 kg (268 lb 3.2 oz)     PHYSICAL EXAM: General: NAD Neck: JVP ?8 cm, no thyromegaly or thyroid nodule.  Lungs: Clear to auscultation bilaterally with normal respiratory effort. CV: Nondisplaced PMI.  Heart regular S1/S2, no S3/S4, 2/6 SEM RUSB with clear S2.  No peripheral edema.  No carotid bruit.  Normal pedal pulses.  Abdomen: Soft, nontender, no hepatosplenomegaly, no  distention.  Skin: Intact without lesions or rashes.  Neurologic: Alert and oriented x 3.  Psych: Normal affect. Extremities: No clubbing or cyanosis.  HEENT: Normal.   ASSESSMENT & PLAN: 1. Chronic systolic CHF: Nonischemic cardiomyopathy.  Last echo 9/19 with EF 40-45%. Out of range for ICD.  NYHA class III.  She is very inactive, but this seems more due to orthopedic issues and deconditioning than CHF.  Exam is difficult for volume, but I do not think that she is significantly volume overloaded.  REDS clip not elevated (33%).  - Continue Lasix 20 mg every other day.  BMET today.  - Continue spironolactone 25 mg daily.    -  Continue Entresto 49/51 bid.  - Continue Coreg 25 mg Bid. - I will arrange for repeat echo.   2. OSA: Has not tolerated CPAP.   3. Obesity: Weight is up, suspect that this is due to minimal activity with significant caloric intake.   4. Hyperlipidemia: Continue Crestor.    5. Depression: She has a psychiatrist. Think this plays a major role in her symptomatology.   Followup in 3 months                                                              Loralie Champagne 06/17/2019

## 2019-06-26 ENCOUNTER — Other Ambulatory Visit: Payer: Self-pay

## 2019-06-26 ENCOUNTER — Ambulatory Visit (HOSPITAL_COMMUNITY)
Admission: RE | Admit: 2019-06-26 | Discharge: 2019-06-26 | Disposition: A | Discharge status: Home / Self Care | Payer: Medicare HMO | Source: Ambulatory Visit | Attending: Internal Medicine | Admitting: Internal Medicine

## 2019-06-26 DIAGNOSIS — I5042 Chronic combined systolic (congestive) and diastolic (congestive) heart failure: Secondary | ICD-10-CM | POA: Insufficient documentation

## 2019-06-26 LAB — BASIC METABOLIC PANEL
Anion gap: 9 (ref 5–15)
BUN: 22 mg/dL (ref 8–23)
CO2: 24 mmol/L (ref 22–32)
Calcium: 9 mg/dL (ref 8.9–10.3)
Chloride: 108 mmol/L (ref 98–111)
Creatinine, Ser: 1.55 mg/dL — ABNORMAL HIGH (ref 0.44–1.00)
GFR calc Af Amer: 39 mL/min — ABNORMAL LOW (ref >60)
GFR calc non Af Amer: 34 mL/min — ABNORMAL LOW (ref >60)
Glucose, Bld: 102 mg/dL — ABNORMAL HIGH (ref 70–99)
Potassium: 4.7 mmol/L (ref 3.5–5.1)
Sodium: 141 mmol/L (ref 135–145)

## 2019-07-27 ENCOUNTER — Encounter (HOSPITAL_COMMUNITY): Payer: Self-pay | Admitting: Psychiatry

## 2019-07-27 ENCOUNTER — Other Ambulatory Visit: Payer: Self-pay

## 2019-07-27 ENCOUNTER — Ambulatory Visit (INDEPENDENT_AMBULATORY_CARE_PROVIDER_SITE_OTHER): Payer: Medicare HMO | Admitting: Psychiatry

## 2019-07-27 DIAGNOSIS — F09 Unspecified mental disorder due to known physiological condition: Secondary | ICD-10-CM | POA: Diagnosis not present

## 2019-07-27 DIAGNOSIS — F330 Major depressive disorder, recurrent, mild: Secondary | ICD-10-CM

## 2019-07-27 DIAGNOSIS — F419 Anxiety disorder, unspecified: Secondary | ICD-10-CM

## 2019-07-27 MED ORDER — TRAZODONE HCL 100 MG PO TABS: 100.0000 mg | ORAL_TABLET | Freq: Every day | ORAL | 0 refills | Status: DC

## 2019-07-27 MED ORDER — DULOXETINE HCL 60 MG PO CPEP: 60.0000 mg | ORAL_CAPSULE | Freq: Every day | ORAL | 0 refills | Status: DC

## 2019-07-27 NOTE — Progress Notes (Signed)
Virtual Visit via Telephone Note  I connected with Robin Hardin on 07/27/19 at  1:00 PM EST by telephone and verified that I am speaking with the correct person using two identifiers.   I discussed the limitations, risks, security and privacy concerns of performing an evaluation and management service by telephone and the availability of in person appointments. I also discussed with the patient that there may be a patient responsible charge related to this service. The patient expressed understanding and agreed to proceed.   History of Present Illness: Patient was evaluated by phone session.  On the last visit we increase Cymbalta to 60 mg and recommended to try trazodone to help insomnia.  She is feeling much better.  She has no more crying spells and she is more motivated and has increased energy.  She is also sleeping better.  She has no tremors, shakes or any EPS.  She is taking over-the-counter to help her Robin and she noticed improvement in her attention and concentration and she is able to recall few things about the past.  She is not sure what is the substance she is taking over-the-counter but so far she do not recall any side effects.  Patient denies any paranoia, hallucination or any suicidal thoughts.  Recently she had blood work and her creatinine is 1.44.  It dropped from 1.77.  She lives with her husband and taking care of her husband who recently had quadruple bypass surgery.  Patient denies drinking or using any illegal substances.  Her energy level is improved.    Past Psychiatric History:Reviewed and updated. H/O depression and anxiety. No h/oinpatient treatment, psychosis or mania andsuicidal attempt.TookProzacbut stoppedworking.Wellbutrin, Rexulti, celexa, Ambien and abilify did not work.Remeronand Amitriptylinecaused weight gain. [place] not approved.   Recent Results (from the past 2160 hour(s))  Basic metabolic panel     Status: Abnormal   Collection Time:  04/29/19  1:25 PM  Result Value Ref Range   Sodium 142 135 - 145 mmol/L   Potassium 4.8 3.5 - 5.1 mmol/L   Chloride 114 (H) 98 - 111 mmol/L   CO2 18 (L) 22 - 32 mmol/L   Glucose, Bld 93 70 - 99 mg/dL   BUN 34 (H) 8 - 23 mg/dL   Creatinine, Ser 1.77 (H) 0.44 - 1.00 mg/dL   Calcium 9.3 8.9 - 10.3 mg/dL   GFR calc non Af Amer 29 (L) >60 mL/min   GFR calc Af Amer 34 (L) >60 mL/min   Anion gap 10 5 - 15    Comment: Performed at [facility] [address], [place], [place] [address]  Basic metabolic panel     Status: Abnormal   Collection Time: 05/13/19 11:38 AM  Result Value Ref Range   Sodium 143 135 - 145 mmol/L   Potassium 4.5 3.5 - 5.1 mmol/L   Chloride 115 (H) 98 - 111 mmol/L   CO2 20 (L) 22 - 32 mmol/L   Glucose, Bld 76 70 - 99 mg/dL   BUN 19 8 - 23 mg/dL   Creatinine, Ser 1.41 (H) 0.44 - 1.00 mg/dL   Calcium 9.0 8.9 - 10.3 mg/dL   GFR calc non Af Amer 38 (L) >60 mL/min   GFR calc Af Amer 45 (L) >60 mL/min   Anion gap 8 5 - 15    Comment: Performed at [facility] [address] [place], [place] [address]  Basic Metabolic Panel (BMET)     Status: Abnormal  Collection Time: 06/16/19  2:02 PM  Result Value Ref Range   Sodium 144 135 - 145 mmol/L   Potassium 5.3 (H) 3.5 - 5.1 mmol/L   Chloride 110 98 - 111 mmol/L   CO2 23 22 - 32 mmol/L   Glucose, Bld 90 70 - 99 mg/dL   BUN 31 (H) 8 - 23 mg/dL   Creatinine, Ser 1.77 (H) 0.44 - 1.00 mg/dL   Calcium 9.3 8.9 - 10.3 mg/dL   GFR calc non Af Amer 29 (L) >60 mL/min   GFR calc Af Amer 34 (L) >60 mL/min   Anion gap 11 5 - 15    Comment: Performed at [facility], [place] [address], [place], [place] [address]  Basic Metabolic Panel (BMET)     Status: Abnormal   Collection Time: 06/26/19  1:45 PM  Result Value Ref Range   Sodium 141 135 - 145 mmol/L   Potassium 4.7 3.5 - 5.1 mmol/L   Chloride 108 98 - 111 mmol/L   CO2 24 22 - 32 mmol/L   Glucose, Bld 102 (H) 70 - 99 mg/dL   BUN 22 8 - 23 mg/dL   Creatinine,  Ser 1.55 (H) 0.44 - 1.00 mg/dL   Calcium 9.0 8.9 - 10.3 mg/dL   GFR calc non Af Amer 34 (L) >60 mL/min   GFR calc Af Amer 39 (L) >60 mL/min   Anion gap 9 5 - 15    Comment: Performed at [facility] [address], [place], [place] [address]     Psychiatric Specialty Exam: Physical Exam  ROS  There were no vitals taken for this visit.There is no height or weight on file to calculate BMI.  General Appearance: NA  Eye Contact:  NA  Speech:  Slow  Volume:  Decreased  Mood:  Euthymic  Affect:  NA  Thought Process:  Descriptions of Associations: Intact  Orientation:  Full (Time, Place, and Person)  Thought Content:  Rumination  Suicidal Thoughts:  No  Homicidal Thoughts:  No  Robin:  Immediate;   Fair Recent;   Fair Remote;   Fair  Judgement:  Fair  Insight:  Fair  Psychomotor Activity:  NA  Concentration:  Concentration: Fair and Attention Span: Fair  Recall:  [employer] of Knowledge:  Fair  Language:  Fair  Akathisia:  No  Handed:  Right  AIMS (if indicated):     Assets:  Communication Skills Desire for Improvement Housing Resilience Social Support  ADL's:  Intact  Cognition:  WNL  Sleep:   good      Assessment and Plan: Major depressive disorder, recurrent.  Anxiety.  Mild cognitive impairment.  Patient doing better since Cymbalta increased to 60 mg and trazodone to 100 mg.  I recommend she should discuss with her primary care physician or neurology about over-the-counter meds she is taking to improve Robin.  She agreed with the plan.  She does not feel she need to see a therapist as she is doing much better.  Continue Cymbalta 60 mg daily and trazodone 100 mg at bedtime.  Recommended to call us back if she is any question of any concern.  Follow-up in 3 months.  Follow Up Instructions:    I discussed the assessment and treatment plan with the patient. The patient was provided an opportunity to ask questions and all were answered. The patient agreed  with the plan and demonstrated an understanding of the instructions.   The patient was  advised to call back or seek an in-person evaluation if the symptoms worsen or if the condition fails to improve as anticipated.  I provided 20 minutes of non-face-to-face time during this encounter.   Kathlee Nations, MD

## 2019-07-31 ENCOUNTER — Telehealth (HOSPITAL_COMMUNITY): Payer: Self-pay | Admitting: Pharmacy Technician

## 2019-07-31 NOTE — Telephone Encounter (Signed)
Spoke to patient regarding re-enrollment of Entresto in [employer]. Will mail the application for the patient to sign.   Will follow up.  Charlann Boxer, CPhT

## 2019-09-04 ENCOUNTER — Telehealth (HOSPITAL_COMMUNITY): Payer: Self-pay | Admitting: Pharmacist

## 2019-09-04 NOTE — Telephone Encounter (Signed)
Sent in [employer] application to [employer] for [employer].   Phone: [phone] Fax: [phone]  Application pending, will continue to follow.  Audry Riles, PharmD, BCPS, BCCP, CPP Heart Failure Clinic Pharmacist [phone]

## 2019-09-14 ENCOUNTER — Other Ambulatory Visit (HOSPITAL_COMMUNITY): Payer: Self-pay | Admitting: Cardiology

## 2019-09-17 ENCOUNTER — Ambulatory Visit (HOSPITAL_COMMUNITY)
Admission: RE | Admit: 2019-09-17 | Discharge: 2019-09-17 | Disposition: A | Discharge status: Home / Self Care | Payer: Medicare HMO | Source: Ambulatory Visit | Attending: Cardiology | Admitting: Cardiology

## 2019-09-17 ENCOUNTER — Encounter (HOSPITAL_COMMUNITY): Payer: Self-pay | Admitting: Cardiology

## 2019-09-17 ENCOUNTER — Ambulatory Visit (HOSPITAL_BASED_OUTPATIENT_CLINIC_OR_DEPARTMENT_OTHER)
Admission: RE | Admit: 2019-09-17 | Discharge: 2019-09-17 | Disposition: A | Discharge status: Home / Self Care | Payer: Medicare HMO | Source: Ambulatory Visit | Attending: Cardiology | Admitting: Cardiology

## 2019-09-17 ENCOUNTER — Other Ambulatory Visit: Payer: Self-pay

## 2019-09-17 VITALS — BP 139/86 | HR 68 | Wt 305.8 lb

## 2019-09-17 DIAGNOSIS — M109 Gout, unspecified: Secondary | ICD-10-CM | POA: Diagnosis not present

## 2019-09-17 DIAGNOSIS — Z8249 Family history of ischemic heart disease and other diseases of the circulatory system: Secondary | ICD-10-CM | POA: Diagnosis not present

## 2019-09-17 DIAGNOSIS — R0602 Shortness of breath: Secondary | ICD-10-CM | POA: Diagnosis not present

## 2019-09-17 DIAGNOSIS — Z7984 Long term (current) use of oral hypoglycemic drugs: Secondary | ICD-10-CM | POA: Diagnosis not present

## 2019-09-17 DIAGNOSIS — F329 Major depressive disorder, single episode, unspecified: Secondary | ICD-10-CM | POA: Diagnosis not present

## 2019-09-17 DIAGNOSIS — I130 Hypertensive heart and chronic kidney disease with heart failure and stage 1 through stage 4 chronic kidney disease, or unspecified chronic kidney disease: Secondary | ICD-10-CM | POA: Insufficient documentation

## 2019-09-17 DIAGNOSIS — M545 Low back pain: Secondary | ICD-10-CM | POA: Insufficient documentation

## 2019-09-17 DIAGNOSIS — I5042 Chronic combined systolic (congestive) and diastolic (congestive) heart failure: Secondary | ICD-10-CM | POA: Insufficient documentation

## 2019-09-17 DIAGNOSIS — E785 Hyperlipidemia, unspecified: Secondary | ICD-10-CM | POA: Diagnosis not present

## 2019-09-17 DIAGNOSIS — Z79899 Other long term (current) drug therapy: Secondary | ICD-10-CM | POA: Insufficient documentation

## 2019-09-17 DIAGNOSIS — I428 Other cardiomyopathies: Secondary | ICD-10-CM | POA: Diagnosis not present

## 2019-09-17 DIAGNOSIS — G4733 Obstructive sleep apnea (adult) (pediatric): Secondary | ICD-10-CM | POA: Insufficient documentation

## 2019-09-17 DIAGNOSIS — N189 Chronic kidney disease, unspecified: Secondary | ICD-10-CM | POA: Insufficient documentation

## 2019-09-17 DIAGNOSIS — E1122 Type 2 diabetes mellitus with diabetic chronic kidney disease: Secondary | ICD-10-CM | POA: Insufficient documentation

## 2019-09-17 DIAGNOSIS — Z7982 Long term (current) use of aspirin: Secondary | ICD-10-CM | POA: Insufficient documentation

## 2019-09-17 DIAGNOSIS — Z86718 Personal history of other venous thrombosis and embolism: Secondary | ICD-10-CM | POA: Diagnosis not present

## 2019-09-17 LAB — BASIC METABOLIC PANEL
Anion gap: 7 (ref 5–15)
BUN: 31 mg/dL — ABNORMAL HIGH (ref 8–23)
CO2: 20 mmol/L — ABNORMAL LOW (ref 22–32)
Calcium: 8.8 mg/dL — ABNORMAL LOW (ref 8.9–10.3)
Chloride: 114 mmol/L — ABNORMAL HIGH (ref 98–111)
Creatinine, Ser: 1.62 mg/dL — ABNORMAL HIGH (ref 0.44–1.00)
GFR calc Af Amer: 37 mL/min — ABNORMAL LOW (ref >60)
GFR calc non Af Amer: 32 mL/min — ABNORMAL LOW (ref >60)
Glucose, Bld: 97 mg/dL (ref 70–99)
Potassium: 4.7 mmol/L (ref 3.5–5.1)
Sodium: 141 mmol/L (ref 135–145)

## 2019-09-17 LAB — BRAIN NATRIURETIC PEPTIDE: B Natriuretic Peptide: 105.8 pg/mL — ABNORMAL HIGH (ref 0.0–100.0)

## 2019-09-17 NOTE — Patient Instructions (Signed)
Labs done today, your results will be available in MyChart, we will contact you for abnormal readings.  You have been referred to Dr Leafy Ro for weight management, her office will call you to schedule this  Please call our office in June to schedule your 6 month follow up  If you have any questions or concerns before your next appointment please send [place] a message through [place] or call our office at [phone].  At the [facility], you and your health needs are our priority. As part of our continuing mission to provide you with exceptional heart care, we have created designated Provider Care Teams. These Care Teams include your primary Cardiologist (physician) and Advanced Practice Providers (APPs- Physician Assistants and Nurse Practitioners) who all work together to provide you with the care you need, when you need it.   You may see any of the following providers on your designated Care Team at your next follow up: Marland Kitchen Dr Glori Bickers . Dr Loralie Champagne . Darrick Grinder, NP . Lyda Jester, PA . Audry Riles, PharmD   Please be sure to bring in all your medications bottles to every appointment.

## 2019-09-17 NOTE — Progress Notes (Signed)
Echocardiogram 2D Echocardiogram has been performed.  Oneal Deputy Janeece Blok 09/17/2019, 1:53 PM

## 2019-09-19 NOTE — Progress Notes (Signed)
[name], female   DOB: 1950/10/23, 69 y.o.   MRN: [id number]    Advanced Heart Failure Clinic Note   Primary Care: Robin Anis, MD Primary Cardiologist: Dr. Aundra Hardin  HPI:  [name] is a 69 y.o. AAF with history of morbid obesity, HTN, and diastolic HF diagnosed in 9892.  She was eventually diagnosed with systolic heart failure by v-gram (09/2012) with an EF approximately 25%.  LHC showed no significant coronary disease.  She also has OSA with CPAP.   She had stopped taking her fluid pills in Feb 2014 due to cramping and ended up being admitted with respiratory failure.  She was diuresed ~40 pounds.  Her torsemide was changed to lasix to avoid cramping.  Echo showed LVEF 25%. Discharge weight 387 lbs. Echo in 12/14 showed EF 40-45% with mild LV dilation.  Echo in 9/16 showed EF 35-40% with grade II diastolic dysfunction.  She had atypical chest pain in 6/17, Cardiolite at that time showed EF up to 52% with no ischemia/infarction. Echo in 9/19 showed EF 40-45%.     Echo was done today and reviewed, EF up to 50-55% with mild diffuse hypokinesis, mild LVH.   She returns for followup of CHF.  She remains inactive.  She can walk short distances without problems but uses a motorized scooter whenever she leaves the house and has a chair lift to go up her stairs.  Weight is up 20 lbs.  She gets minimal exercise and eats a carbohydrate-rich diet.  Still short of breath with moderate exertion but does most of the housework.  As her weight has gone up, low back pain has worsened.  No chest pain, orthopnea, or PND.   ECG (personally reviewed): NSR, nonspecific T wave flattening  Labs (7/14): K 3.9 Creatinine 0.91, LDL 105 Labs (06/05/13): K 3.7 Creatinine 0.87 Pro BNP 56  Labs (1/15): K 4, creatinine 0.9 Labs (07/27/14): K 4.2 Creatinine 0.92 Hgb 11.8 HIV NR Labs (5/16): K 5, creatinine 2.57, TSH normal, HCT 36.5 Labs (7/16): K 4, creatinine 1.08, LDL 132 Labs (9/16): K 3.6,  creatinine 1.17 Labs (10/16): creatinine 2.19 => 1.34 Labs (11/16): K 4, creatinine 1.0, BNP 114 Labs (5/17): LDL 221, creatinine 2.07 Labs (6/17): K 4, creatinine 1.15 Labs (10/17): LDL 122, HDL 58 Labs (2/18): K 3.7, creatinine 1.28 Labs (3/18): K 4.4, creatinine 1.17 Labs (8/18): TSH normal Labs (9/19): LDL 105 Labs (12/19): K 3.7, creatinine 1.15 Labs (7/20): K 4.2, creatinine 1.55 Labs (9/20): K 4.5, creatinine 2.26 => 1.4 Labs (11/20): K 4.7, creatinine 1.55   Past Medical History:  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 She was hospitalized at [facility] in [place] where she had a cath showing normal coronaries but EF 30-35% on LV-gram. She has been hospitalized two other times since then in [place] for CHF. Echo (8/10) showed EF 45-50%, mild global HK, mild LVH, grade I diastolic dysfunction, no significant valvular problems.  Echo (2/14) with EF severely decreased (poor windows so not quantified, appears about 25%).  Echo (12/14) with EF 40-45%, mild LV dilation, moderate LVH, moderate LAE.   - Echo (9/16) with EF 35-40%, grade II diastolic dysfunction, mild MR.  - Lexiscan Cardiolite (6/17) with EF 52%, no ischemia/infarction.  - Echo (9/19): EF 40-45%.  2. HTN  3. Depression  4. Morbid obesity  5. Hyperlipidemia  6. OSA: sporadic with CPAP  7. Gout on allopurinol  8. Bilateral knee pain, probable  osteoarthritis  9. Diabetes type II  10. Left lower leg cellulitis in 10/11 with normal [place] for DVT 11. CKD.   Family History:  No FH of premature CAD  asthma: mother, 2 brothers  heart disease: mother  clotting disorders: mother (stroke)   Social History:  Recently from [place] to [place].  Married with 2 adopted children.  Never smoked, no ETOH or drugs.  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   Review of Systems  All systems reviewed and negative except as per HPI.   Current Outpatient Medications  Medication Sig  Dispense Refill  . allopurinol (ZYLOPRIM) 300 MG tablet Take 1 tablet (300 mg total) by mouth daily. 90 tablet 3  . ALOE PO Take 1 capsule by mouth 2 (two) times daily.    Marland Kitchen aspirin 81 MG EC tablet Take 1 tablet (81 mg total) by mouth daily. 90 tablet 3  . carvedilol (COREG) 25 MG tablet Take 1 tablet (25 mg total) by mouth 2 (two) times daily with a meal. 180 tablet 3  . DULoxetine (CYMBALTA) 60 MG capsule Take 1 capsule (60 mg total) by mouth daily. 90 capsule 0  . furosemide (LASIX) 40 MG tablet Take 0.5 tablets (20 mg total) by mouth every other day. 45 tablet 3  . metFORMIN (GLUCOPHAGE) 500 MG tablet Take 1 tablet (500 mg total) by mouth 2 (two) times daily with a meal. 180 tablet 1  . omeprazole (PRILOSEC) 20 MG capsule TAKE 1 CAPSULE EVERY DAY 90 capsule 0  . potassium chloride SA (KLOR-CON) 20 MEQ tablet TAKE 1 TABLET EVERY DAY 90 tablet 1  . rosuvastatin (CRESTOR) 20 MG tablet Take 1 tablet (20 mg total) by mouth daily. 90 tablet 3  . sacubitril-valsartan (ENTRESTO) 49-51 MG Take 1 tablet by mouth 2 (two) times daily. 180 tablet 3  . spironolactone (ALDACTONE) 25 MG tablet Take 1 tablet (25 mg total) by mouth daily. 90 tablet 3  . traZODone (DESYREL) 100 MG tablet Take 1 tablet (100 mg total) by mouth at bedtime. 90 tablet 0   No current facility-administered medications for this encounter.    Allergies  Allergen Reactions  . Abilify [Aripiprazole] Nausea Only     Vitals:   09/17/19 1402  BP: 139/86  Pulse: 68  SpO2: 100%  Weight: (!) 138.7 kg (305 lb 12.8 oz)   Wt Readings from Last 3 Encounters:  09/17/19 (!) 138.7 kg (305 lb 12.8 oz)  06/16/19 130 kg (286 lb 9.6 oz)  03/17/19 123.2 kg (271 lb 9.6 oz)     PHYSICAL EXAM: General: NAD, obese  Neck: No JVD, no thyromegaly or thyroid nodule.  Lungs: Clear to auscultation bilaterally with normal respiratory effort. CV: Nondisplaced PMI.  Heart regular S1/S2, no S3/S4, no murmur.  No peripheral edema.  No carotid bruit.   Normal pedal pulses.  Abdomen: Soft, nontender, no hepatosplenomegaly, no distention.  Skin: Intact without lesions or rashes.  Neurologic: Alert and oriented x 3.  Psych: Normal affect. Extremities: No clubbing or cyanosis.  HEENT: Normal.   ASSESSMENT & PLAN: 1. Chronic systolic CHF: Nonischemic cardiomyopathy.  Echo was done today and reviewed, EF has increased to 50-55%. NYHA class III.  She is very inactive, but this seems more due to orthopedic issues and deconditioning than CHF.  She does not appear significantly volume overloaded, I think that weight gain is caloric and not due to CHF.  - Continue Lasix 20 mg every other day.  BMET today.  - Continue  spironolactone 25 mg daily.    - Continue Entresto 49/51 bid.  - Continue Coreg 25 mg Bid. 2. OSA: Has not tolerated CPAP.   3. Obesity: Weight is up, suspect that this is due to minimal activity with significant caloric intake.    - [facility]er to [facility].  4. Hyperlipidemia: Continue Crestor.    5. Depression: She has a psychiatrist. Think this plays a major role in her symptomatology.   Followup in 6 months                                                              Loralie Champagne 09/19/2019

## 2019-09-23 NOTE — Telephone Encounter (Signed)
Advanced Heart Failure Patient Advocate Encounter   Patient was approved to receive Entresto from [employer]. Patient aware.   Patient ID: [id number] Effective dates: 09/23/19 through 08/19/20  Audry Riles, PharmD, BCPS, BCCP, CPP Heart Failure Clinic Pharmacist [phone]

## 2019-10-05 ENCOUNTER — Telehealth (HOSPITAL_COMMUNITY): Payer: Self-pay | Admitting: Licensed Clinical Social Worker

## 2019-10-05 NOTE — Telephone Encounter (Signed)
CSW received call from pt requesting assistance with getting COVID-19 vaccine- pt reports she has tried multiple times on her own without success.  CSW explained option to sign up through [facility] or through [place]- pt agreeable to CSW trying to sign up pt on her behalf. Set up [facility] account with pt so CSW can attempt tomorrow morning. Registration through [facility] also not available until tomorrow.  CSW will continue to follow and assist as needed  Jorge Ny, [facility] Desk#: [phone] Cell#: [phone]

## 2019-10-07 ENCOUNTER — Telehealth (HOSPITAL_COMMUNITY): Payer: Self-pay | Admitting: Licensed Clinical Social Worker

## 2019-10-07 NOTE — Telephone Encounter (Signed)
CSW attempted to get pt COVID vaccine shot scheduled yesterday morning and this morning but [place] are already full and [facility] site is not working.  CSW will continue to follow and assist as needed- will plan to attempt again tomorrow morning  Jorge Ny, [facility] Desk#: [phone] Cell#: [phone]

## 2019-10-14 ENCOUNTER — Telehealth (HOSPITAL_COMMUNITY): Payer: Self-pay | Admitting: Licensed Clinical Social Worker

## 2019-10-14 NOTE — Telephone Encounter (Signed)
CSW assisting patient to get COVID-19 vaccine.  Appts available on [facility] website this morning so CSW assisted in signing patient up for 1st and 2nd vaccine appt - 1st scheduled for this Friday.  CSW sent email to patient with appt information and with directions to the [facility].  CSW will continue to follow and assist as needed  Jorge Ny, [facility] Desk#: [phone] Cell#: [phone]

## 2019-10-16 ENCOUNTER — Telehealth (HOSPITAL_COMMUNITY): Payer: Self-pay | Admitting: Licensed Clinical Social Worker

## 2019-10-16 NOTE — Telephone Encounter (Signed)
CSW called pt to check in before her vaccine appt this morning to confirm she remembers and that she is all set to get there.  Pt confirms she is about to head there early to make sure she doesn't miss her appt and gets her paperwork completed a head of time- no assistance needed at this time  CSW will continue to follow and assist as needed  Jorge Ny, [facility] Desk#: [phone] Cell#: [phone]

## 2019-10-26 ENCOUNTER — Other Ambulatory Visit: Payer: Self-pay

## 2019-10-26 ENCOUNTER — Encounter (HOSPITAL_COMMUNITY): Payer: Self-pay | Admitting: Psychiatry

## 2019-10-26 ENCOUNTER — Ambulatory Visit (INDEPENDENT_AMBULATORY_CARE_PROVIDER_SITE_OTHER): Payer: Medicare HMO | Admitting: Psychiatry

## 2019-10-26 DIAGNOSIS — F419 Anxiety disorder, unspecified: Secondary | ICD-10-CM

## 2019-10-26 DIAGNOSIS — F330 Major depressive disorder, recurrent, mild: Secondary | ICD-10-CM | POA: Diagnosis not present

## 2019-10-26 MED ORDER — DULOXETINE HCL 60 MG PO CPEP: 60.0000 mg | ORAL_CAPSULE | Freq: Every day | ORAL | 0 refills | Status: DC

## 2019-10-26 MED ORDER — TRAZODONE HCL 100 MG PO TABS: 100.0000 mg | ORAL_TABLET | Freq: Every day | ORAL | 0 refills | Status: DC

## 2019-10-26 NOTE — Progress Notes (Signed)
Virtual Visit via Telephone Note  I connected with Robin Hardin on 10/26/19 at  2:00 PM EST by telephone and verified that I am speaking with the correct person using two identifiers.   I discussed the limitations, risks, security and privacy concerns of performing an evaluation and management service by telephone and the availability of in person appointments. I also discussed with the patient that there may be a patient responsible charge related to this service. The patient expressed understanding and agreed to proceed.   History of Present Illness: Patient was evaluated by phone session.  She is taking Cymbalta and trazodone which is working well.  Lately she complains of dysphoric mood because her pain has been not relieved by Aleve.  She is seeing primary care physician for pain and hoping to have a better medication.  Overall she described her depression and anxiety is under control.  She feels the Cymbalta has helped her a lot since we had increase the dose to 60 mg.  She has no tremors, shakes or any EPS.  Her husband is also improving slowly and gradually and started to walk with the help of walker.  Her husband has quadruple bypass surgery.  Her energy level is okay.  She wants to continue current medication.  Her Robin is improved since she has cut down some more the counter medication.   Past Psychiatric History: H/O depression and anxiety. No h/oinpatient, psychosis, mania andsuicidal attempt.TookProzacbut quit working.Wellbutrin, Rexulti, celexa, Ambien and abilify did not work.Remeronand Amitriptylinecaused weight gain. [place] not approved.    Psychiatric Specialty Exam: Physical Exam  Review of Systems  There were no vitals taken for this visit.There is no height or weight on file to calculate BMI.  General Appearance: NA  Eye Contact:  NA  Speech:  Slow  Volume:  Decreased  Mood:  Dysphoric  Affect:  NA  Thought Process:  Descriptions of Associations: Intact   Orientation:  Full (Time, Place, and Person)  Thought Content:  Logical  Suicidal Thoughts:  No  Homicidal Thoughts:  No  Robin:  Immediate;   Fair Recent;   Fair Remote;   Fair  Judgement:  Intact  Insight:  Good  Psychomotor Activity:  NA  Concentration:  Concentration: Fair and Attention Span: Fair  Recall:  [employer] of Knowledge:  Good  Language:  Good  Akathisia:  No  Handed:  Right  AIMS (if indicated):     Assets:  Communication Skills Desire for Improvement Housing Resilience Social Support  ADL's:  Intact  Cognition:  WNL  Sleep:   improved      Assessment and Plan: Major depressive disorder, recurrent.  Anxiety.  Patient is a stable on Cymbalta 60 mg and trazodone 100 mg at bedtime.  Discussed medication side effects and benefits.  She is having chronic pain and she is hoping to call PCP to address that issue.  Discussed medication side effects and benefits.  Recommended to call us back if she has any question or any concern.  Follow-up in 3 months.  Follow Up Instructions:    I discussed the assessment and treatment plan with the patient. The patient was provided an opportunity to ask questions and all were answered. The patient agreed with the plan and demonstrated an understanding of the instructions.   The patient was advised to call back or seek an in-person evaluation if the symptoms worsen or if the condition fails to improve as anticipated.  I provided 15 minutes of non-face-to-face  time during this encounter.   Kathlee Nations, MD

## 2019-11-10 ENCOUNTER — Other Ambulatory Visit: Payer: Self-pay

## 2019-11-10 ENCOUNTER — Ambulatory Visit (INDEPENDENT_AMBULATORY_CARE_PROVIDER_SITE_OTHER): Payer: Medicare HMO | Admitting: Internal Medicine

## 2019-11-10 VITALS — BP 159/96 | HR 65 | Temp 98.5°F | Ht 63.0 in | Wt 325.7 lb

## 2019-11-10 DIAGNOSIS — M545 Low back pain, unspecified: Secondary | ICD-10-CM

## 2019-11-10 MED ORDER — METHOCARBAMOL 500 MG PO TABS: 500.0000 mg | ORAL_TABLET | Freq: Three times a day (TID) | ORAL | 0 refills | Status: DC | PRN

## 2019-11-10 NOTE — Progress Notes (Signed)
CC: Back Pain  HPI:  [name] is a 70 y.o. F with PMHx listed below presenting for Back Pain. Please see the A&P for the status of the patient's chronic medical problems.  Past Medical History:  Diagnosis Date  . Acute respiratory failure with hypoxia ([place]) 09/26/2012  . Bilateral knee pain   . Cellulitis of lower leg    left  . Chronic combined systolic and diastolic congestive heart failure ([place]) 10/10/2012   2 D echo 09/2012:  Left ventricle: Extremely poor acoustic windows limit study. Overall LVEF appears to be severely depressed. Recomm ordering limited study with contrast to further evaluate LV systolic function. The cavity size was normal. Wall thickness was normal. Doppler parameters are consistent with abnormal left ventricular relaxation (grade 1 diastolic dysfunction).   2 D echo 03/2009 :  Left ventricle: The cavity size was normal. There was mild concentric hypertrophy. Systolic function was mildly reduced. The estimated ejection fraction was in the range of 45% to 50%. Diffuse hypokinesis. Doppler parameters are consistent with abnormal left ventricular relaxation (grade 1 diastolic dysfunction).     . Chronic kidney disease 10/10/2012   Stage 2 with GFR of 64   . Depression   . Gout   . H/O: hysterectomy   . Hyperlipidemia   . Hypertension   . Malignant hypertension 09/26/2012  . Morbid obesity ([place])   . Nonischemic cardiomyopathy ([place])   . Sleep apnea    on cpap  . Unspecified essential hypertension 03/22/2009   Qualifier: Diagnosis of  By: Karrie Meres, RN, BSN, [place]     Review of Systems:  Performed and all others negative.  Physical Exam:  Vitals:   11/10/19 0955  BP: (!) 159/96  Pulse: 65  Temp: 98.5 F (36.9 C)  TempSrc: Oral  SpO2: 97%  Weight: (!) 325 lb 11.2 oz (147.7 kg)  Height: 5' 3" (1.6 m)   Physical Exam Constitutional:      General: She is not in acute distress.    Appearance: Normal appearance.  Cardiovascular:     Rate and Rhythm:  Normal rate and regular rhythm.     Pulses: Normal pulses.     Heart sounds: Normal heart sounds.  Pulmonary:     Effort: Pulmonary effort is normal. No respiratory distress.     Breath sounds: Normal breath sounds.  Abdominal:     General: Bowel sounds are normal. There is no distension.     Palpations: Abdomen is soft.     Tenderness: There is no abdominal tenderness.  Musculoskeletal:        General: No swelling or deformity.     Comments: No gross deformity. No tenderness over the spine. Right paraspinal muscle tightness. Tenderness over bilateral sacral iliac joints. Straight leg raise is negative. Strength is 5/5 bilateral LE, though possible 4-5/5 in some muscles versus some decreased effort. No reported weakness. Lumbar ROM is limited by body habitus and pain.   Skin:    General: Skin is warm and dry.  Neurological:     General: No focal deficit present.     Mental Status: Mental status is at baseline.    Assessment & Plan:   See Encounters Tab for problem based charting.  Patient discussed with Dr. Lynnae January

## 2019-11-10 NOTE — Patient Instructions (Addendum)
Thank you for allowing [place] to care for you  For your Back Pain - This is likely due to musculoskeletal causes, including arthritis and muscle spasm - We will place a referral for physical therapy - Take muscle relaxant (Robaxin) as needed, take one per day at first to see how it affects you - Decreased Naproxen use as constant use can be harmful for your kidneys - Apply heat to the area several times per day - Take tylenol as needed  Follow up as needed if pain is not improved with PT in about 4-6 weeks

## 2019-11-10 NOTE — Assessment & Plan Note (Signed)
Patient presents for low back pain. She has had some pain in the past, but state this pain is new a different. The pain started 2-3 weeks ago. She denies and inciting event, but she does help care for her husband who underwent a quadruple bypass in 2019 and has never fully recovered; which does involve helping him get around and do things. The pain is located in her low back and radiates some into her hips and buttocks. She has tried Aleve BID for at least 2 weeks with minimal improvement.  On physical exam the is no tenderness over the spine. She has right paraspinal muscle tightness and tenderness over her sacral iliac joint. Straight leg raise is negative. Strength is 5/5 bilateral LE, though possible 4-5/5 in some muscles versus some decreased effort. No reported weakness. ROM is limited by body habitus and pain.   Her presentation appears to be musculoskeletal in origin. Do not suspect sciatic pain given lack of neurologic symptoms and no radiation past the knee. Though there was no specific inciting event she may have strained her self helping her husband and now has some spasms of her right paraspinal musculature. I also suspect a degree of SI arthritis given her history of significant arthritis of her knees several years ago and she is at a higher risk due to her body habitus.   Will continue to treat conservatively in this acute phase. Will not recommend further NSAIDs given her 2 weeks + course already and history of CKD. She is to take tylenol scheduled for 1 week then as needed. She does not want any cremes/rubs as she finds these cumbersome to apply. Recommend applying heat several time per day and will refer to PT. Will also provide PRN muscle relaxant for muscle spasms and pain. She is advised to take only one pill the first day when she is not going out o see how this affects her. She can then increase to 2-3 per day as needed. She is to follow up if her pain worsens or fails to improve in 4-6  weeks. - Tylenol 629m q6h scheduled for 1 week, then PRN - Robaxin [id number] PRN (after trial dose) - Referral to PT - Apply heat TID PRN - Follow up in 4-6 weeks if not improved

## 2019-11-11 NOTE — Progress Notes (Signed)
Internal Medicine Clinic Attending  Case discussed with Dr. Melvin  at the time of the visit.  We reviewed the resident's history and exam and pertinent patient test results.  I agree with the assessment, diagnosis, and plan of care documented in the resident's note.  

## 2019-11-24 ENCOUNTER — Other Ambulatory Visit: Payer: Self-pay | Admitting: Internal Medicine

## 2019-11-24 ENCOUNTER — Telehealth: Payer: Self-pay | Admitting: Internal Medicine

## 2019-11-24 DIAGNOSIS — I5042 Chronic combined systolic (congestive) and diastolic (congestive) heart failure: Secondary | ICD-10-CM

## 2019-11-24 NOTE — Telephone Encounter (Signed)
Rec'd a Referral message from  From: Alonna Minium @ [facility]:  Sent: 11/17/2019  10:21 AM EDT Subject: Patient requesting home health PT instead of using the [facility].  Please advise if a new Referral can be placed for [facility] PT.

## 2019-11-24 NOTE — Telephone Encounter (Signed)
I will place referral for [facility] PT

## 2019-11-24 NOTE — Addendum Note (Signed)
Addended by: Hulan Fray on: 11/24/2019 06:47 PM   Modules accepted: Orders

## 2019-11-25 NOTE — Telephone Encounter (Signed)
Thank you.  Referral has been authorized.

## 2019-11-25 NOTE — Telephone Encounter (Signed)
Brunette, Britney  Dionicio Shelnutt, Orvis Brill, RN  Yes ma'am we can accept if you are okay with a weekend start of care?   Returned message to [place] that weekend [facility] would be acceptable. Hubbard Hartshorn, BSN, RN-BC

## 2019-11-25 NOTE — Telephone Encounter (Signed)
Community message sent to Gwenyth Bender at [facility] to see if she can take patient for [facility] PT. L. Nevena Rozenberg, BSN, RN-BC

## 2019-12-03 ENCOUNTER — Telehealth: Payer: Self-pay | Admitting: Internal Medicine

## 2019-12-03 ENCOUNTER — Other Ambulatory Visit: Payer: Self-pay

## 2019-12-03 DIAGNOSIS — G8929 Other chronic pain: Secondary | ICD-10-CM | POA: Diagnosis not present

## 2019-12-03 DIAGNOSIS — N182 Chronic kidney disease, stage 2 (mild): Secondary | ICD-10-CM | POA: Diagnosis not present

## 2019-12-03 DIAGNOSIS — M109 Gout, unspecified: Secondary | ICD-10-CM | POA: Diagnosis not present

## 2019-12-03 DIAGNOSIS — I5042 Chronic combined systolic (congestive) and diastolic (congestive) heart failure: Secondary | ICD-10-CM | POA: Diagnosis not present

## 2019-12-03 DIAGNOSIS — I130 Hypertensive heart and chronic kidney disease with heart failure and stage 1 through stage 4 chronic kidney disease, or unspecified chronic kidney disease: Secondary | ICD-10-CM | POA: Diagnosis not present

## 2019-12-03 DIAGNOSIS — M545 Low back pain, unspecified: Secondary | ICD-10-CM

## 2019-12-03 DIAGNOSIS — I428 Other cardiomyopathies: Secondary | ICD-10-CM | POA: Diagnosis not present

## 2019-12-03 DIAGNOSIS — F329 Major depressive disorder, single episode, unspecified: Secondary | ICD-10-CM | POA: Diagnosis not present

## 2019-12-03 DIAGNOSIS — E785 Hyperlipidemia, unspecified: Secondary | ICD-10-CM | POA: Diagnosis not present

## 2019-12-03 MED ORDER — METHOCARBAMOL 500 MG PO TABS: 500.0000 mg | ORAL_TABLET | Freq: Three times a day (TID) | ORAL | 0 refills | Status: DC | PRN

## 2019-12-03 NOTE — Telephone Encounter (Signed)
methocarbamol (ROBAXIN) 500 MG tablet   REFILL REQUEST @ [facility] ON [address].

## 2019-12-03 NOTE — Telephone Encounter (Signed)
PCP has recently placed [facility] PT orders-CMA gave verbal authorization given to [facility] date April 19th.Regenia Skeeter, Janya Eveland Cassady4/15/20214:03 PM

## 2019-12-03 NOTE — Telephone Encounter (Signed)
Robin Tillie Rung Z) requesting a Verbal Order for the Following:  PT 2 times a Week for 3 Weeks PT 1 time a Week for 2 weeks PT for 1 (once) a Week for Every Other Week for 2 Weeks.

## 2019-12-04 ENCOUNTER — Other Ambulatory Visit: Payer: Self-pay | Admitting: *Deleted

## 2019-12-04 DIAGNOSIS — M545 Low back pain, unspecified: Secondary | ICD-10-CM

## 2019-12-04 MED ORDER — METHOCARBAMOL 500 MG PO TABS: 500.0000 mg | ORAL_TABLET | Freq: Three times a day (TID) | ORAL | 0 refills | Status: DC | PRN

## 2019-12-04 NOTE — Telephone Encounter (Signed)
Great. Thank you. Agree with HH PT

## 2019-12-04 NOTE — Telephone Encounter (Signed)
Youlanda Roys, Britney  [place], Orvis Brill, RN   Ms. Lasalle was admitted to our services on 12/03/2019

## 2019-12-08 DIAGNOSIS — I130 Hypertensive heart and chronic kidney disease with heart failure and stage 1 through stage 4 chronic kidney disease, or unspecified chronic kidney disease: Secondary | ICD-10-CM | POA: Diagnosis not present

## 2019-12-08 DIAGNOSIS — I428 Other cardiomyopathies: Secondary | ICD-10-CM | POA: Diagnosis not present

## 2019-12-08 DIAGNOSIS — E785 Hyperlipidemia, unspecified: Secondary | ICD-10-CM | POA: Diagnosis not present

## 2019-12-08 DIAGNOSIS — N182 Chronic kidney disease, stage 2 (mild): Secondary | ICD-10-CM | POA: Diagnosis not present

## 2019-12-08 DIAGNOSIS — G8929 Other chronic pain: Secondary | ICD-10-CM | POA: Diagnosis not present

## 2019-12-08 DIAGNOSIS — F329 Major depressive disorder, single episode, unspecified: Secondary | ICD-10-CM | POA: Diagnosis not present

## 2019-12-08 DIAGNOSIS — M109 Gout, unspecified: Secondary | ICD-10-CM | POA: Diagnosis not present

## 2019-12-08 DIAGNOSIS — I5042 Chronic combined systolic (congestive) and diastolic (congestive) heart failure: Secondary | ICD-10-CM | POA: Diagnosis not present

## 2019-12-09 ENCOUNTER — Other Ambulatory Visit: Payer: Self-pay | Admitting: Internal Medicine

## 2019-12-09 DIAGNOSIS — Z1231 Encounter for screening mammogram for malignant neoplasm of breast: Secondary | ICD-10-CM

## 2019-12-11 DIAGNOSIS — I428 Other cardiomyopathies: Secondary | ICD-10-CM | POA: Diagnosis not present

## 2019-12-11 DIAGNOSIS — I5042 Chronic combined systolic (congestive) and diastolic (congestive) heart failure: Secondary | ICD-10-CM | POA: Diagnosis not present

## 2019-12-11 DIAGNOSIS — M109 Gout, unspecified: Secondary | ICD-10-CM | POA: Diagnosis not present

## 2019-12-11 DIAGNOSIS — F329 Major depressive disorder, single episode, unspecified: Secondary | ICD-10-CM | POA: Diagnosis not present

## 2019-12-11 DIAGNOSIS — E785 Hyperlipidemia, unspecified: Secondary | ICD-10-CM | POA: Diagnosis not present

## 2019-12-11 DIAGNOSIS — I130 Hypertensive heart and chronic kidney disease with heart failure and stage 1 through stage 4 chronic kidney disease, or unspecified chronic kidney disease: Secondary | ICD-10-CM | POA: Diagnosis not present

## 2019-12-11 DIAGNOSIS — G8929 Other chronic pain: Secondary | ICD-10-CM | POA: Diagnosis not present

## 2019-12-11 DIAGNOSIS — N182 Chronic kidney disease, stage 2 (mild): Secondary | ICD-10-CM | POA: Diagnosis not present

## 2019-12-15 ENCOUNTER — Encounter (INDEPENDENT_AMBULATORY_CARE_PROVIDER_SITE_OTHER): Payer: Self-pay | Admitting: Family Medicine

## 2019-12-15 ENCOUNTER — Ambulatory Visit (INDEPENDENT_AMBULATORY_CARE_PROVIDER_SITE_OTHER): Payer: Medicare HMO | Admitting: Family Medicine

## 2019-12-15 ENCOUNTER — Other Ambulatory Visit: Payer: Self-pay

## 2019-12-15 VITALS — BP 98/56 | HR 54 | Temp 98.6°F | Ht 63.0 in | Wt 301.0 lb

## 2019-12-15 DIAGNOSIS — D72819 Decreased white blood cell count, unspecified: Secondary | ICD-10-CM | POA: Diagnosis not present

## 2019-12-15 DIAGNOSIS — F329 Major depressive disorder, single episode, unspecified: Secondary | ICD-10-CM | POA: Diagnosis not present

## 2019-12-15 DIAGNOSIS — N189 Chronic kidney disease, unspecified: Secondary | ICD-10-CM | POA: Diagnosis not present

## 2019-12-15 DIAGNOSIS — N1832 Chronic kidney disease, stage 3b: Secondary | ICD-10-CM | POA: Diagnosis not present

## 2019-12-15 DIAGNOSIS — F32A Depression, unspecified: Secondary | ICD-10-CM

## 2019-12-15 DIAGNOSIS — R0602 Shortness of breath: Secondary | ICD-10-CM | POA: Diagnosis not present

## 2019-12-15 DIAGNOSIS — R748 Abnormal levels of other serum enzymes: Secondary | ICD-10-CM | POA: Diagnosis not present

## 2019-12-15 DIAGNOSIS — Z6841 Body Mass Index (BMI) 40.0 and over, adult: Secondary | ICD-10-CM

## 2019-12-15 DIAGNOSIS — F419 Anxiety disorder, unspecified: Secondary | ICD-10-CM | POA: Diagnosis not present

## 2019-12-15 DIAGNOSIS — M5442 Lumbago with sciatica, left side: Secondary | ICD-10-CM

## 2019-12-15 DIAGNOSIS — I5042 Chronic combined systolic (congestive) and diastolic (congestive) heart failure: Secondary | ICD-10-CM | POA: Diagnosis not present

## 2019-12-15 DIAGNOSIS — Z0289 Encounter for other administrative examinations: Secondary | ICD-10-CM

## 2019-12-15 DIAGNOSIS — E119 Type 2 diabetes mellitus without complications: Secondary | ICD-10-CM

## 2019-12-15 DIAGNOSIS — G4719 Other hypersomnia: Secondary | ICD-10-CM | POA: Diagnosis not present

## 2019-12-15 DIAGNOSIS — R5383 Other fatigue: Secondary | ICD-10-CM

## 2019-12-15 DIAGNOSIS — R7989 Other specified abnormal findings of blood chemistry: Secondary | ICD-10-CM | POA: Diagnosis not present

## 2019-12-15 NOTE — Progress Notes (Signed)
Dear Dr. Aundra Hardin,   Thank you for referring Robin Hardin to our clinic. The following note includes my evaluation and treatment recommendations.  Chief Complaint:   OBESITY Robin Hardin (MR# [id number]) is a 69 y.o. female who presents for evaluation and treatment of obesity and related comorbidities. Current BMI is Body mass index is 53.32 kg/m. Andy has been struggling with her weight for many years and has been unsuccessful in either losing weight, maintaining weight loss, or reaching her healthy weight goal.  Robin Hardin is currently in the action stage of change and ready to dedicate time achieving and maintaining a healthier weight. Robin Hardin is interested in becoming our patient and working on intensive lifestyle modifications including (but not limited to) diet and exercise for weight loss.  Robin Hardin ate a vegetarian diet for 2 months and says she lost 15 pounds.  She is taking a diuretic every other day.  She is drinking extra water.  Her PCP is Dr. Gilberto Better, IM Residency.  Robin Hardin's habits were reviewed today and are as follows: Her family eats meals together, her desired weight loss is 103 pounds, she started gaining weight in 1977, her heaviest weight ever was 397 pounds, she has craves BBQ and ginger snap cookies, she snacks frequently in the evenings, she skips lunch everyday, she is frequently drinking liquids with calories, she frequently makes poor food choices, she frequently eats larger portions than normal and she struggles with emotional eating.  Depression Screen [name]'s Food and Mood (modified PHQ-9) score was 15.  Depression screen [facility] - [place] 2/9 12/15/2019  Decreased Interest 3  Down, Depressed, Hopeless 3  PHQ - 2 Score 6  Altered sleeping 0  Tired, decreased energy 3  Change in appetite 0  Feeling bad or failure about yourself  2  Trouble concentrating 2  Moving slowly or fidgety/restless 2  Suicidal thoughts 0  PHQ-9 Score 15  Difficult doing  work/chores Not difficult at all  Some recent data might be hidden   Subjective:   1. Fatigue, unspecified type Robin Hardin reports daytime somnolence and admits to waking up still tired. Patent has a history of symptoms of morning fatigue. Robin Hardin generally gets 6 hours of sleep per night, and states that she has generally restful sleep. She is not sure if she snores. Apneic episodes are not present. Epworth Sleepiness Score is 9.  2. SOB (shortness of breath) on exertion Robin Hardin notes increasing shortness of breath with exercising and seems to be worsening over time with weight gain. She notes getting out of breath sooner with activity than she used to. This has gotten worse recently. Robin Hardin denies shortness of breath at rest or orthopnea.  3. Excessive daytime sleepiness Robin Hardin has a history of OSA and used to use a CPAP.  She stopped using it a couple of years ago.  Epworth score is 9 today.  Situation Chance of Dozing or Sleeping  Sitting and reading 3 = high chance of dozing or sleeping  Watching television 3 = high chance of dozing or sleeping  Sitting inactive in a public place (theater or meeting) 0 = would never doze or sleep  Lying down in the afternoon when circumstances permit 3 = high chance of dozing or sleeping  Sitting and talking to someone 0 = would never doze or sleep  Sitting quietly after lunch without alcohol 0 = would never doze or sleep  In a car, while stopped for a few minutes in traffic 0 = would never doze  or sleep  TOTAL 9   4. Chronic combined systolic and diastolic congestive heart failure (HCC) Robin Hardin is followed by Dr. Aundra Hardin for this.   5. Type 2 diabetes mellitus without complication, without long-term current use of insulin (HCC) Medications reviewed. Diabetic ROS: no polyuria or polydipsia, no chest pain, dyspnea or TIA's, no numbness, tingling or pain in extremities.  She is taking metformin 500 mg twice daily.  Lab Results  Component Value Date    HGBA1C 5.3 07/29/2018   HGBA1C 5.5 11/08/2015   HGBA1C 5.1 03/31/2015   Lab Results  Component Value Date   MICROALBUR 0.3 01/20/2015   LDLCALC 105 (H) 04/30/2018   CREATININE 1.62 (H) 09/17/2019   6. Chronic kidney disease, stage 3b Lab Results  Component Value Date   CREATININE 1.62 (H) 09/17/2019   CREATININE 1.55 (H) 06/26/2019   CREATININE 1.77 (H) 06/16/2019   Lab Results  Component Value Date   CREATININE 1.62 (H) 09/17/2019   BUN 31 (H) 09/17/2019   NA 141 09/17/2019   K 4.7 09/17/2019   CL 114 (H) 09/17/2019   CO2 20 (L) 09/17/2019   7. Left-sided low back pain with left-sided sciatica, unspecified chronicity Robin Hardin started PT one week ago.  She is being followed by her PCP for this.  8. Anxiety and depression, with emotional eating Robin Hardin reports that she overeats.  She is followed by Dr. Adele [facility] at [facility].  PHQ-9 is 15 today.  Assessment/Plan:   1. Fatigue, unspecified type Robin Hardin does feel that her weight is causing her energy to be lower than it should be. Fatigue may be related to obesity, depression or many other causes. Labs will be ordered, and in the meanwhile, Robin Hardin will focus on self care including making healthy food choices, increasing physical activity and focusing on stress reduction.  Orders - EKG 12-Lead - VITAMIN D 25 Hydroxy (Vit-D Deficiency, Fractures) - T4, free - T3 - TSH - Anemia panel  2. SOB (shortness of breath) on exertion Robin Hardin does feel that she gets out of breath more easily that she used to when she exercises. Robin Hardin's shortness of breath appears to be obesity related and exercise induced. She has agreed to work on weight loss and gradually increase exercise to treat her exercise induced shortness of breath. Will continue to monitor closely.  Orders - CBC with Differential/Platelet - Lipid panel  3. Excessive daytime sleepiness Intensive lifestyle modifications are the first line treatment for this issue.  We discussed several lifestyle modifications today and she will continue to work on diet, exercise and weight loss efforts. We will continue to monitor. Orders and follow up as documented in patient record.   Counseling  Sleep apnea is a condition in which breathing pauses or becomes shallow during sleep. This happens over and over during the night. This disrupts your sleep and keeps your body from getting the rest that it needs, which can cause tiredness and lack of energy (fatigue) during the day.  Sleep apnea treatment: If you were given a device to open your airway while you sleep, USE IT!  Sleep hygiene:   Limit or avoid alcohol, caffeinated beverages, and cigarettes, especially close to bedtime.   Do not eat a large meal or eat spicy foods right before bedtime. This can lead to digestive discomfort that can make it hard for you to sleep.  Keep a sleep diary to help you and your health care provider figure out what could be causing your insomnia.  . Make  your bedroom a dark, comfortable place where it is easy to fall asleep. ? Put up shades or blackout curtains to block light from outside. ? Use a white noise machine to block noise. ? Keep the temperature cool. . Limit screen use before bedtime. This includes: ? Watching TV. ? Using your smartphone, tablet, or computer. . Stick to a routine that includes going to bed and waking up at the same times every day and night. This can help you fall asleep faster. Consider making a quiet activity, such as reading, part of your nighttime routine. . Try to avoid taking naps during the day so that you sleep better at night. . Get out of bed if you are still awake after 15 minutes of trying to sleep. Keep the lights down, but try reading or doing a quiet activity. When you feel sleepy, go back to bed.  4. Chronic combined systolic and diastolic congestive heart failure ([place]) Followed by Dr. Aundra Hardin for this problem. Those encounter notes were  reviewed. Orders and follow up as documented in patient record.  5. Type 2 diabetes mellitus without complication, without long-term current use of insulin (HCC) Good blood sugar control is important to decrease the likelihood of diabetic complications such as nephropathy, neuropathy, limb loss, blindness, coronary artery disease, and death. Intensive lifestyle modification including diet, exercise and weight loss are the first line of treatment for diabetes.   Orders - Hemoglobin A1c - Insulin, random  6. Chronic kidney disease, CKD stage 3b Lab results and trends reviewed. We discussed several lifestyle modifications today and she will continue to work on diet, exercise and weight loss efforts. Avoid nephrotoxic medications. Orders and follow up as documented in patient record.   Counseling . Chronic kidney disease (CKD) happens when the kidneys are damaged over a long period of time. . Most of the time, this condition does not go away, but it can usually be controlled. Steps must be taken to slow down the kidney damage or to stop it from getting worse. . Intensive lifestyle modifications are the first line treatment for this issue.  Marland Kitchen Avoid buying foods that are: processed, frozen, or prepackaged to avoid excess salt.  Orders - Comprehensive metabolic panel  7. Left-sided low back pain with left-sided sciatica, unspecified chronicity Will follow because mobility and pain control are important for weight management.  8. Anxiety and depression, with emotional eating Behavior modification techniques were discussed today to help Mirabelle deal with her emotional/non-hunger eating behaviors.  Orders and follow up as documented in patient record.   9. Class 3 severe obesity with serious comorbidity and body mass index (BMI) of 50.0 to 59.9 in adult, unspecified obesity type ([place]) [place] is currently in the action stage of change and her goal is to continue with weight loss efforts. I  recommend Joey begin the structured treatment plan as follows:  She has agreed to the Category 2 Plan +100 calories.  Exercise goals: No exercise has been prescribed at this time.   Behavioral modification strategies: increasing lean protein intake, decreasing simple carbohydrates, increasing vegetables, increasing water intake and decreasing liquid calories.  She was informed of the importance of frequent follow-up visits to maximize her success with intensive lifestyle modifications for her multiple health conditions. She was informed we would discuss her lab results at her next visit unless there is a critical issue that needs to be addressed sooner. Pheonix agreed to keep her next visit at the agreed upon time to discuss these results.  Objective:   Blood pressure (!) 98/56, pulse (!) 54, temperature 98.6 F (37 C), temperature source Oral, height 5' 3" (1.6 m), weight (!) 301 lb (136.5 kg), SpO2 97 %. Body mass index is 53.32 kg/m.  EKG: Normal sinus rhythm, rate 55 bpm.  Indirect Calorimeter completed today shows a VO2 of 245 and a REE of 1703.  Her calculated basal metabolic rate is 6438 thus her basal metabolic rate is worse than expected.  General: Cooperative, alert, well developed, in no acute distress. HEENT: Conjunctivae and lids unremarkable. Cardiovascular: Regular rhythm.  Lungs: Normal work of breathing. Neurologic: No focal deficits.   Lab Results  Component Value Date   CREATININE 1.62 (H) 09/17/2019   BUN 31 (H) 09/17/2019   NA 141 09/17/2019   K 4.7 09/17/2019   CL 114 (H) 09/17/2019   CO2 20 (L) 09/17/2019   Lab Results  Component Value Date   ALT 18 07/29/2018   AST 19 07/29/2018   ALKPHOS 62 07/29/2018   BILITOT 0.4 07/29/2018   Lab Results  Component Value Date   HGBA1C 5.3 07/29/2018   HGBA1C 5.5 11/08/2015   HGBA1C 5.1 03/31/2015   HGBA1C 5.5 12/28/2014   HGBA1C 5.4 07/27/2014   Lab Results  Component Value Date   TSH 3.170 05/13/2018    Lab Results  Component Value Date   CHOL 166 04/30/2018   HDL 40 (L) 04/30/2018   LDLCALC 105 (H) 04/30/2018   LDLDIRECT 160.6 06/14/2009   TRIG 107 04/30/2018   CHOLHDL 4.2 04/30/2018   Lab Results  Component Value Date   WBC 2.9 (L) 07/29/2018   HGB 12.3 07/29/2018   HCT 37.9 07/29/2018   MCV 91 07/29/2018   PLT 219 07/29/2018   Lab Results  Component Value Date   IRON 79 12/28/2014   TIBC 322 12/28/2014   FERRITIN 104 12/28/2014   Attestation Statements:   This is the patient's first visit at [facility]. The patient's NEW PATIENT PACKET was reviewed at length. Included in the packet: current and past health history, medications, allergies, ROS, gynecologic history (women only), surgical history, family history, social history, weight history, weight loss surgery history (for those that have had weight loss surgery), nutritional evaluation, mood and food questionnaire, PHQ9, Epworth questionnaire, sleep habits questionnaire, patient life and health improvement goals questionnaire. These will all be scanned into the patient's chart under media.   During the visit, I independently reviewed the patient's EKG, bioimpedance scale results, and indirect calorimeter results. I used this information to tailor a meal plan for the patient that will help her to lose weight and will improve her obesity-related conditions going forward. I performed a medically necessary appropriate examination and/or evaluation. I discussed the assessment and treatment plan with the patient. The patient was provided an opportunity to ask questions and all were answered. The patient agreed with the plan and demonstrated an understanding of the instructions. Labs were ordered at this visit and will be reviewed at the next visit unless more critical results need to be addressed immediately. Clinical information was updated and documented in the EMR.   Time spent on visit including pre-visit chart  review and post-visit charting and care was 62 minutes.   I, Water quality scientist, CMA, am acting as Location manager for [employer], DO.  I have reviewed the above documentation for accuracy and completeness, and I agree with the above. Briscoe Deutscher, DO

## 2019-12-16 DIAGNOSIS — F329 Major depressive disorder, single episode, unspecified: Secondary | ICD-10-CM | POA: Diagnosis not present

## 2019-12-16 DIAGNOSIS — N182 Chronic kidney disease, stage 2 (mild): Secondary | ICD-10-CM | POA: Diagnosis not present

## 2019-12-16 DIAGNOSIS — G8929 Other chronic pain: Secondary | ICD-10-CM | POA: Diagnosis not present

## 2019-12-16 DIAGNOSIS — M109 Gout, unspecified: Secondary | ICD-10-CM | POA: Diagnosis not present

## 2019-12-16 DIAGNOSIS — I5042 Chronic combined systolic (congestive) and diastolic (congestive) heart failure: Secondary | ICD-10-CM | POA: Diagnosis not present

## 2019-12-16 DIAGNOSIS — E785 Hyperlipidemia, unspecified: Secondary | ICD-10-CM | POA: Diagnosis not present

## 2019-12-16 DIAGNOSIS — I130 Hypertensive heart and chronic kidney disease with heart failure and stage 1 through stage 4 chronic kidney disease, or unspecified chronic kidney disease: Secondary | ICD-10-CM | POA: Diagnosis not present

## 2019-12-16 DIAGNOSIS — I428 Other cardiomyopathies: Secondary | ICD-10-CM | POA: Diagnosis not present

## 2019-12-16 LAB — COMPREHENSIVE METABOLIC PANEL
ALT: 23 IU/L (ref 0–32)
AST: 21 IU/L (ref 0–40)
Albumin/Globulin Ratio: 1.8 (ref 1.2–2.2)
Albumin: 4.6 g/dL (ref 3.8–4.8)
Alkaline Phosphatase: 72 IU/L (ref 39–117)
BUN/Creatinine Ratio: 20 (ref 12–28)
BUN: 31 mg/dL — ABNORMAL HIGH (ref 8–27)
Bilirubin Total: 0.3 mg/dL (ref 0.0–1.2)
CO2: 21 mmol/L (ref 20–29)
Calcium: 9.8 mg/dL (ref 8.7–10.3)
Chloride: 104 mmol/L (ref 96–106)
Creatinine, Ser: 1.56 mg/dL — ABNORMAL HIGH (ref 0.57–1.00)
GFR calc Af Amer: 39 mL/min/{1.73_m2} — ABNORMAL LOW (ref >59)
GFR calc non Af Amer: 34 mL/min/{1.73_m2} — ABNORMAL LOW (ref >59)
Globulin, Total: 2.5 g/dL (ref 1.5–4.5)
Glucose: 86 mg/dL (ref 65–99)
Potassium: 4.3 mmol/L (ref 3.5–5.2)
Sodium: 142 mmol/L (ref 134–144)
Total Protein: 7.1 g/dL (ref 6.0–8.5)

## 2019-12-16 LAB — CBC WITH DIFFERENTIAL/PLATELET
Basophils Absolute: 0 10*3/uL (ref 0.0–0.2)
Basos: 1 %
EOS (ABSOLUTE): 0.1 10*3/uL (ref 0.0–0.4)
Eos: 5 %
Hemoglobin: 13.3 g/dL (ref 11.1–15.9)
Immature Grans (Abs): 0 10*3/uL (ref 0.0–0.1)
Immature Granulocytes: 0 %
Lymphocytes Absolute: 0.5 10*3/uL — ABNORMAL LOW (ref 0.7–3.1)
Lymphs: 19 %
MCH: 32 pg (ref 26.6–33.0)
MCHC: 34 g/dL (ref 31.5–35.7)
MCV: 94 fL (ref 79–97)
Monocytes Absolute: 0.3 10*3/uL (ref 0.1–0.9)
Monocytes: 11 %
Neutrophils Absolute: 1.6 10*3/uL (ref 1.4–7.0)
Neutrophils: 64 %
Platelets: 155 10*3/uL (ref 150–450)
RBC: 4.16 x10E6/uL (ref 3.77–5.28)
RDW: 12 % (ref 11.7–15.4)
WBC: 2.5 10*3/uL — CL (ref 3.4–10.8)

## 2019-12-16 LAB — ANEMIA PANEL
Ferritin: 48 ng/mL (ref 15–150)
Folate, Hemolysate: 465 ng/mL
Folate, RBC: 1189 ng/mL (ref >498)
Hematocrit: 39.1 % (ref 34.0–46.6)
Iron Saturation: 24 % (ref 15–55)
Iron: 84 ug/dL (ref 27–139)
Retic Ct Pct: 0.7 % (ref 0.6–2.6)
Total Iron Binding Capacity: 354 ug/dL (ref 250–450)
UIBC: 270 ug/dL (ref 118–369)
Vitamin B-12: 1289 pg/mL — ABNORMAL HIGH (ref 232–1245)

## 2019-12-16 LAB — INSULIN, RANDOM: INSULIN: 6.9 u[IU]/mL (ref 2.6–24.9)

## 2019-12-16 LAB — HEMOGLOBIN A1C
Est. average glucose Bld gHb Est-mCnc: 105 mg/dL
Hgb A1c MFr Bld: 5.3 % (ref 4.8–5.6)

## 2019-12-16 LAB — VITAMIN D 25 HYDROXY (VIT D DEFICIENCY, FRACTURES): Vit D, 25-Hydroxy: 26.6 ng/mL — ABNORMAL LOW (ref 30.0–100.0)

## 2019-12-16 LAB — LIPID PANEL
Chol/HDL Ratio: 2.9 ratio (ref 0.0–4.4)
Cholesterol, Total: 196 mg/dL (ref 100–199)
HDL: 68 mg/dL (ref >39)
LDL Chol Calc (NIH): 114 mg/dL — ABNORMAL HIGH (ref 0–99)
Triglycerides: 78 mg/dL (ref 0–149)
VLDL Cholesterol Cal: 14 mg/dL (ref 5–40)

## 2019-12-16 LAB — T3: T3, Total: 100 ng/dL (ref 71–180)

## 2019-12-16 LAB — TSH: TSH: 1.5 u[IU]/mL (ref 0.450–4.500)

## 2019-12-16 LAB — T4, FREE: Free T4: 1.53 ng/dL (ref 0.82–1.77)

## 2019-12-21 ENCOUNTER — Other Ambulatory Visit: Payer: Self-pay | Admitting: Internal Medicine

## 2019-12-21 DIAGNOSIS — F329 Major depressive disorder, single episode, unspecified: Secondary | ICD-10-CM | POA: Diagnosis not present

## 2019-12-21 DIAGNOSIS — N182 Chronic kidney disease, stage 2 (mild): Secondary | ICD-10-CM | POA: Diagnosis not present

## 2019-12-21 DIAGNOSIS — I428 Other cardiomyopathies: Secondary | ICD-10-CM | POA: Diagnosis not present

## 2019-12-21 DIAGNOSIS — I5042 Chronic combined systolic (congestive) and diastolic (congestive) heart failure: Secondary | ICD-10-CM | POA: Diagnosis not present

## 2019-12-21 DIAGNOSIS — G8929 Other chronic pain: Secondary | ICD-10-CM | POA: Diagnosis not present

## 2019-12-21 DIAGNOSIS — I130 Hypertensive heart and chronic kidney disease with heart failure and stage 1 through stage 4 chronic kidney disease, or unspecified chronic kidney disease: Secondary | ICD-10-CM | POA: Diagnosis not present

## 2019-12-21 DIAGNOSIS — M109 Gout, unspecified: Secondary | ICD-10-CM | POA: Diagnosis not present

## 2019-12-21 DIAGNOSIS — E785 Hyperlipidemia, unspecified: Secondary | ICD-10-CM | POA: Diagnosis not present

## 2019-12-24 DIAGNOSIS — I130 Hypertensive heart and chronic kidney disease with heart failure and stage 1 through stage 4 chronic kidney disease, or unspecified chronic kidney disease: Secondary | ICD-10-CM | POA: Diagnosis not present

## 2019-12-24 DIAGNOSIS — E785 Hyperlipidemia, unspecified: Secondary | ICD-10-CM | POA: Diagnosis not present

## 2019-12-24 DIAGNOSIS — I428 Other cardiomyopathies: Secondary | ICD-10-CM | POA: Diagnosis not present

## 2019-12-24 DIAGNOSIS — I5042 Chronic combined systolic (congestive) and diastolic (congestive) heart failure: Secondary | ICD-10-CM | POA: Diagnosis not present

## 2019-12-24 DIAGNOSIS — N182 Chronic kidney disease, stage 2 (mild): Secondary | ICD-10-CM | POA: Diagnosis not present

## 2019-12-24 DIAGNOSIS — G8929 Other chronic pain: Secondary | ICD-10-CM | POA: Diagnosis not present

## 2019-12-24 DIAGNOSIS — F329 Major depressive disorder, single episode, unspecified: Secondary | ICD-10-CM | POA: Diagnosis not present

## 2019-12-24 DIAGNOSIS — M109 Gout, unspecified: Secondary | ICD-10-CM | POA: Diagnosis not present

## 2019-12-29 ENCOUNTER — Ambulatory Visit (INDEPENDENT_AMBULATORY_CARE_PROVIDER_SITE_OTHER): Payer: Medicare HMO | Admitting: Family Medicine

## 2019-12-29 ENCOUNTER — Telehealth: Payer: Self-pay | Admitting: Internal Medicine

## 2019-12-29 ENCOUNTER — Other Ambulatory Visit (HOSPITAL_COMMUNITY): Payer: Self-pay | Admitting: Cardiology

## 2019-12-29 ENCOUNTER — Encounter (INDEPENDENT_AMBULATORY_CARE_PROVIDER_SITE_OTHER): Payer: Self-pay | Admitting: Family Medicine

## 2019-12-29 ENCOUNTER — Other Ambulatory Visit: Payer: Self-pay

## 2019-12-29 VITALS — BP 100/69 | HR 56 | Temp 98.1°F | Ht 63.0 in | Wt 296.0 lb

## 2019-12-29 DIAGNOSIS — E559 Vitamin D deficiency, unspecified: Secondary | ICD-10-CM | POA: Diagnosis not present

## 2019-12-29 DIAGNOSIS — I5042 Chronic combined systolic (congestive) and diastolic (congestive) heart failure: Secondary | ICD-10-CM | POA: Diagnosis not present

## 2019-12-29 DIAGNOSIS — N1832 Chronic kidney disease, stage 3b: Secondary | ICD-10-CM

## 2019-12-29 DIAGNOSIS — M109 Gout, unspecified: Secondary | ICD-10-CM | POA: Diagnosis not present

## 2019-12-29 DIAGNOSIS — F329 Major depressive disorder, single episode, unspecified: Secondary | ICD-10-CM | POA: Diagnosis not present

## 2019-12-29 DIAGNOSIS — I428 Other cardiomyopathies: Secondary | ICD-10-CM | POA: Diagnosis not present

## 2019-12-29 DIAGNOSIS — D72818 Other decreased white blood cell count: Secondary | ICD-10-CM | POA: Diagnosis not present

## 2019-12-29 DIAGNOSIS — N182 Chronic kidney disease, stage 2 (mild): Secondary | ICD-10-CM | POA: Diagnosis not present

## 2019-12-29 DIAGNOSIS — I130 Hypertensive heart and chronic kidney disease with heart failure and stage 1 through stage 4 chronic kidney disease, or unspecified chronic kidney disease: Secondary | ICD-10-CM | POA: Diagnosis not present

## 2019-12-29 DIAGNOSIS — E785 Hyperlipidemia, unspecified: Secondary | ICD-10-CM | POA: Diagnosis not present

## 2019-12-29 DIAGNOSIS — Z6841 Body Mass Index (BMI) 40.0 and over, adult: Secondary | ICD-10-CM | POA: Diagnosis not present

## 2019-12-29 DIAGNOSIS — G8929 Other chronic pain: Secondary | ICD-10-CM | POA: Diagnosis not present

## 2019-12-29 MED ORDER — VITAMIN D (ERGOCALCIFEROL) 1.25 MG (50000 UNIT) PO CAPS: 50000.0000 [IU] | ORAL_CAPSULE | ORAL | 0 refills | Status: AC

## 2019-12-29 NOTE — Telephone Encounter (Signed)
Needs refill on methocarbamol (ROBAXIN) 500 MG tablet  ;pt contact [place] ([place]), [place] - [address]

## 2019-12-29 NOTE — Telephone Encounter (Signed)
Received TC from [place], requesting rollator walker for patient.  Will forward to [place].  Also requesting VO for Social Worker consult evaluation for available resources to family related to Caregiver stress. VO given. Will forward to PCP for agreement or denial. Thank you, SChaplin, RN,BSN

## 2019-12-29 NOTE — Telephone Encounter (Signed)
Pls contact [place] for VO [phone]

## 2019-12-29 NOTE — Telephone Encounter (Signed)
rtc lm for rtc

## 2019-12-30 NOTE — Telephone Encounter (Signed)
Agree with social work consult and rollator walker. Thank you.

## 2019-12-30 NOTE — Progress Notes (Signed)
Chief Complaint:   OBESITY Robin Hardin is here to discuss her progress with her obesity treatment plan along with follow-up of her obesity related diagnoses. Robin Hardin is on the Category 2 Plan +100 calories and states she is following her eating plan approximately 100% of the time. Robin Hardin states she is using a vibrating leg machine for 4-5 minutes 7 times per week.  Today's visit was #: 2 Starting weight: 301 lbs Starting date: 12/15/2019 Today's weight: 296 lbs Today's date: 12/29/2019 Total lbs lost to date: 5 lbs Total lbs lost since last in-office visit: 5 lbs  Interim History: Robin Hardin says she has been skipping lunch sometimes due to not being hungry.  She has been having cravings for grits, BBQ, and hot dogs.  Subjective:   1. Stage 3b chronic kidney disease  Lab Results  Component Value Date   CREATININE 1.56 (H) 12/15/2019   CREATININE 1.62 (H) 09/17/2019   CREATININE 1.55 (H) 06/26/2019   Lab Results  Component Value Date   CREATININE 1.56 (H) 12/15/2019   BUN 31 (H) 12/15/2019   NA 142 12/15/2019   K 4.3 12/15/2019   CL 104 12/15/2019   CO2 21 12/15/2019   2. Other decreased white blood cell (WBC) count Robin Hardin's most recent white blood cell count was low at 2.5.  Lab Results  Component Value Date   WBC 2.5 (LL) 12/15/2019   3. Vitamin D deficiency Robin Hardin's Vitamin D level was 26.6 on 12/15/2019. She is currently taking no vitamin D supplement. She denies nausea, vomiting or muscle weakness.  Assessment/Plan:   1. Stage 3b chronic kidney disease Lab results and trends reviewed. We discussed several lifestyle modifications today and she will continue to work on diet, exercise and weight loss efforts. Avoid nephrotoxic medications. Orders and follow up as documented in patient record.   Counseling . Chronic kidney disease (CKD) happens when the kidneys are damaged over a long period of time. . Most of the time, this condition does not go away, but it can  usually be controlled. Steps must be taken to slow down the kidney damage or to stop it from getting worse. . Intensive lifestyle modifications are the first line treatment for this issue.  Marland Kitchen Avoid buying foods that are: processed, frozen, or prepackaged to avoid excess salt.  2. Other decreased white blood cell (WBC) count Will send message to her PCP and the patient will call for an appointment.  3. Vitamin D deficiency Low Vitamin D level contributes to fatigue and are associated with obesity, breast, and colon cancer. She agrees to start to take prescription Vitamin D _0 ,000 IU every week and will follow-up for routine testing of Vitamin D, at least 2-3 times per year to avoid over-replacement.  Orders - Vitamin D, Ergocalciferol, (DRISDOL) 1.25 MG (50000 UNIT) CAPS capsule; Take 1 capsule (50,000 Units total) by mouth every 7 (seven) days.  Dispense: 4 capsule; Refill: 0  4. Class 3 severe obesity with serious comorbidity and body mass index (BMI) of 50.0 to 59.9 in adult, unspecified obesity type (Robin Hardin) Robin Hardin is currently in the action stage of change. As such, her goal is to continue with weight loss efforts. She has agreed to the Category 2 Plan +100 calories.   Exercise goals: All adults should avoid inactivity. Some physical activity is better than none, and adults who participate in any amount of physical activity gain some health benefits.  Behavioral modification strategies: increasing lean protein intake and increasing water intake.  Robin Hardin  has agreed to follow-up with our clinic in 2 weeks. She was informed of the importance of frequent follow-up visits to maximize her success with intensive lifestyle modifications for her multiple health conditions.   Objective:   Blood pressure 100/69, pulse (!) 56, temperature 98.1 F (36.7 C), temperature source Oral, height _0  (1.6 m), weight 296 lb (134.3 kg), SpO2 98 %. Body mass index is 52.43 kg/m.  General: Cooperative,  alert, well developed, in no acute distress. HEENT: Conjunctivae and lids unremarkable. Cardiovascular: Regular rhythm.  Lungs: Normal work of breathing. Neurologic: No focal deficits.   Lab Results  Component Value Date   CREATININE 1.56 (H) 12/15/2019   BUN 31 (H) 12/15/2019   NA 142 12/15/2019   K 4.3 12/15/2019   CL 104 12/15/2019   CO2 21 12/15/2019   Lab Results  Component Value Date   ALT 23 12/15/2019   AST 21 12/15/2019   ALKPHOS 72 12/15/2019   BILITOT 0.3 12/15/2019   Lab Results  Component Value Date   HGBA1C 5.3 12/15/2019   HGBA1C 5.3 07/29/2018   HGBA1C 5.5 11/08/2015   HGBA1C 5.1 03/31/2015   HGBA1C 5.5 12/28/2014   Lab Results  Component Value Date   INSULIN 6.9 12/15/2019   Lab Results  Component Value Date   TSH 1.500 12/15/2019   Lab Results  Component Value Date   CHOL 196 12/15/2019   HDL 68 12/15/2019   LDLCALC 114 (H) 12/15/2019   LDLDIRECT 160.6 06/14/2009   TRIG 78 12/15/2019   CHOLHDL 2.9 12/15/2019   Lab Results  Component Value Date   WBC 2.5 (LL) 12/15/2019   HGB 13.3 12/15/2019   HCT 39.1 12/15/2019   MCV 94 12/15/2019   PLT 155 12/15/2019   Lab Results  Component Value Date   IRON 84 12/15/2019   TIBC 354 12/15/2019   FERRITIN 48 12/15/2019   Obesity Behavioral Intervention:   Approximately 15 minutes were spent on the discussion below.  ASK: We discussed the diagnosis of obesity with Robin Hardin today and Robin Hardin agreed to give [place] permission to discuss obesity behavioral modification therapy today.  ASSESS: Robin Hardin has the diagnosis of obesity and her BMI today is 52.5. Robin Hardin is in the action stage of change.   ADVISE: Robin Hardin was educated on the multiple health risks of obesity as well as the benefit of weight loss to improve her health. She was advised of the need for long term treatment and the importance of lifestyle modifications to improve her current health and to decrease her risk of future health  problems.  AGREE: Multiple dietary modification options and treatment options were discussed and Robin Hardin agreed to follow the recommendations documented in the above note.  ARRANGE: Robin Hardin was educated on the importance of frequent visits to treat obesity as outlined per CMS and USPSTF guidelines and agreed to schedule her next follow up appointment today.  Attestation Statements:   Reviewed by clinician on day of visit: allergies, medications, problem list, medical history, surgical history, family history, social history, and previous encounter notes.  I, Water quality scientist, CMA, am acting as Location manager for [employer], DO.  I have reviewed the above documentation for accuracy and completeness, and I agree with the above. Briscoe Deutscher, DO

## 2020-01-01 ENCOUNTER — Other Ambulatory Visit: Payer: Self-pay

## 2020-01-01 DIAGNOSIS — M545 Low back pain, unspecified: Secondary | ICD-10-CM

## 2020-01-01 MED ORDER — METHOCARBAMOL 500 MG PO TABS: 500.0000 mg | ORAL_TABLET | Freq: Three times a day (TID) | ORAL | 0 refills | Status: DC | PRN

## 2020-01-01 NOTE — Telephone Encounter (Signed)
methocarbamol (ROBAXIN) 500 MG tablet   [facility] [place] ([place]), [place] - [address] [phone] (Phone) [phone] (Fax)

## 2020-01-04 ENCOUNTER — Other Ambulatory Visit: Payer: Self-pay

## 2020-01-04 ENCOUNTER — Ambulatory Visit (INDEPENDENT_AMBULATORY_CARE_PROVIDER_SITE_OTHER): Payer: Medicare HMO | Admitting: Internal Medicine

## 2020-01-04 VITALS — BP 107/67 | HR 56 | Temp 98.6°F | Wt 300.0 lb

## 2020-01-04 DIAGNOSIS — N1831 Chronic kidney disease, stage 3a: Secondary | ICD-10-CM

## 2020-01-04 DIAGNOSIS — D702 Other drug-induced agranulocytosis: Secondary | ICD-10-CM

## 2020-01-04 DIAGNOSIS — N1832 Chronic kidney disease, stage 3b: Secondary | ICD-10-CM | POA: Diagnosis not present

## 2020-01-04 DIAGNOSIS — I5042 Chronic combined systolic (congestive) and diastolic (congestive) heart failure: Secondary | ICD-10-CM

## 2020-01-04 DIAGNOSIS — M545 Low back pain: Secondary | ICD-10-CM

## 2020-01-04 DIAGNOSIS — Z0000 Encounter for general adult medical examination without abnormal findings: Secondary | ICD-10-CM | POA: Diagnosis not present

## 2020-01-04 DIAGNOSIS — G8929 Other chronic pain: Secondary | ICD-10-CM | POA: Diagnosis not present

## 2020-01-04 DIAGNOSIS — I10 Essential (primary) hypertension: Secondary | ICD-10-CM

## 2020-01-04 DIAGNOSIS — I130 Hypertensive heart and chronic kidney disease with heart failure and stage 1 through stage 4 chronic kidney disease, or unspecified chronic kidney disease: Secondary | ICD-10-CM | POA: Diagnosis not present

## 2020-01-04 MED ORDER — VITAMIN D 25 MCG (1000 UNIT) PO TABS: 1000.0000 [IU] | ORAL_TABLET | Freq: Every day | ORAL | 2 refills | Status: DC

## 2020-01-04 MED ORDER — METHOCARBAMOL 500 MG PO TABS: 500.0000 mg | ORAL_TABLET | Freq: Three times a day (TID) | ORAL | 2 refills | Status: DC | PRN

## 2020-01-04 NOTE — Assessment & Plan Note (Signed)
Robin Hardin is a 69 yo F w/ PMH of combined systolic/diastolic heart failure, htn, hld, morbid obesity, OSA and CKD presenting to [facility] after being found to be leukopenic on labs from her weight loss clinic. She denies any symptoms. Denies any fevers, chills, nausea, vomiting. Denies any sick contact. Mentions that she received both doses of COVID vaccine in March and April without significant side effects.   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 Timing of her new leukopenia coincide with up-titration of her [name] which is a known rare side-effect. ANC wnl at 1600cells/ul. Monitor for now but if worsening, will need referral for heme/onc  - Cbc - C/w monitor

## 2020-01-04 NOTE — Progress Notes (Signed)
CC: Hip pain  HPI: [name] is a 69 y.o. with PMH listed below presenting with complaint of hip pain. Please see problem based assessment and plan for further details.  Past Medical History:  Diagnosis Date  . Acute respiratory failure with hypoxia ([place]) 09/26/2012  . Anxiety   . Arthritis   . Back pain   . Bilateral knee pain   . Cellulitis of lower leg    left  . Chest pain   . Chronic combined systolic and diastolic congestive heart failure ([place]) 10/10/2012   2 D echo 09/2012:  Left ventricle: Extremely poor acoustic windows limit study. Overall LVEF appears to be severely depressed. Recomm ordering limited study with contrast to further evaluate LV systolic function. The cavity size was normal. Wall thickness was normal. Doppler parameters are consistent with abnormal left ventricular relaxation (grade 1 diastolic dysfunction).   2 D echo 03/2009 :  Left ventricle: The cavity size was normal. There was mild concentric hypertrophy. Systolic function was mildly reduced. The estimated ejection fraction was in the range of 45% to 50%. Diffuse hypokinesis. Doppler parameters are consistent with abnormal left ventricular relaxation (grade 1 diastolic dysfunction).     . Chronic kidney disease 10/10/2012   Stage 2 with GFR of 64   . Constipation   . Depression   . Edema, lower extremity   . Gout   . H/O: hysterectomy   . History of heart attack   . Hyperlipidemia   . Hypertension   . Joint pain   . Malignant hypertension 09/26/2012  . Morbid obesity ([place])   . Nonischemic cardiomyopathy ([place])   . Shortness of breath   . Sleep apnea    on cpap  . Unspecified essential hypertension 03/22/2009   Qualifier: Diagnosis of  By: Karrie Meres RN, BSN, [name]    . Vitamin D deficiency    Review of Systems: ROS   Physical Exam: Vitals:   01/04/20 0957  BP: 107/67  Pulse: (!) 56  Temp: 98.6 F (37 C)  TempSrc: Oral  SpO2: 98%  Weight: 300 lb (136.1 kg)   Physical Exam    Constitutional: She is oriented to person, place, and time. She appears well-developed and well-nourished. No distress.  Cardiovascular: Normal rate, regular rhythm, normal heart sounds and intact distal pulses.  No murmur heard. Respiratory: Effort normal and breath sounds normal. She has no wheezes. She has no rales.  GI: Soft. Bowel sounds are normal. She exhibits no distension. There is no abdominal tenderness.  Musculoskeletal:        General: Tenderness (Tenderness to deep palpation on L lower back) present. No edema. Normal range of motion.  Neurological: She is alert and oriented to person, place, and time.  Skin: Skin is warm and dry.     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 She denies any symptoms. Denies any fevers, chills, nausea, vomiting. Denies any sick contact. Mentions that she received both doses of COVID vaccine in March and April without significant side effects.   Lab Results  Component Value Date   WBC 2.5 (LL) 12/15/2019   HGB 13.3 12/15/2019   HCT 39.1 12/15/2019   MCV 94 12/15/2019   PLT 155 12/15/2019   A/P [name] present for f/u for her chronic  leukopenia. She has known leukopenia with wbc less than 3 since 04/2018. Timing of her new leukopenia coincide with up-titration of her [name] which is a known rare side-effect. ANC wnl at 1600cells/ul. 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 On ace/arb, spironolactone and allopurinol and furosemide 11m. Stable for now but will need to d/c nephrotoxic meds if renal fx worsens. Currently appear euvolemic.   - BMP   Low back pain Pt requires refills on  medications with associated diagnosis above.  Reviewed disease process and find this medication to be necessary, will not change dose or alter current therapy.  Chronic combined systolic and diastolic congestive heart failure (HCC) Presents with f/u for home physical therapy for difficulty with ambulation due to heart failure and morbid obesity. Currently on carvedilol, furosemide, Entresto and aldactone. Follows with heart failure clinic with Dr.McLean. Appear euvolemic on exam this visit. Current weight 300lbs but also working with weight-loss clinic for assistance. Also met with home pt who recommend Rolator for assistance w/ mobility.  - C/w current regimen - BMP - Rolator order added    Patient discussed with Dr. RRebeca Alert  -JGilberto Better [facility] Pager: [phone]

## 2020-01-04 NOTE — Patient Instructions (Addendum)
Dear Ms.Memory Argue,  Thank you for allowing [place] to provide your care today. Today we discussed your low white blood count    I have ordered bmp, labs for you. I will call if any are abnormal.    Today we made the following changes to your medications:    Please start vitamin D3 1000units daily  Please follow-up in 3 months.    Should you have any questions or concerns please call the internal medicine clinic at [phone].    Thank you for choosing [facility].    Vitamin D Deficiency Vitamin D deficiency is when your body does not have enough vitamin D. Vitamin D is important to your body because:  It helps your body use other minerals.  It helps to keep your bones strong and healthy.  It may help to prevent some diseases.  It helps your heart and other muscles work well. Not getting enough vitamin D can make your bones soft. It can also cause other health problems. What are the causes? This condition may be caused by:  Not eating enough foods that contain vitamin D.  Not getting enough sun.  Having diseases that make it hard for your body to absorb vitamin D.  Having a surgery in which a part of the stomach or a part of the small intestine is removed.  Having kidney disease or liver disease. What increases the risk? You are more likely to get this condition if:  You are older.  You do not spend much time outdoors.  You live in a nursing home.  You have had broken bones.  You have weak or thin bones (osteoporosis).  You have a disease or condition that changes how your body absorbs vitamin D.  You have dark skin.  You take certain medicines.  You are overweight or obese. What are the signs or symptoms?  In mild cases, there may not be any symptoms. If the condition is very bad, symptoms may include: ? Bone pain. ? Muscle pain. ? Falling often. ? Broken bones caused by a minor injury. How is this treated? Treatment may include taking  supplements as told by your doctor. Your doctor will tell you what dose is best for you. Supplements may include:  Vitamin D.  Calcium. Follow these instructions at home: Eating and drinking   Eat foods that contain vitamin D, such as: ? Dairy products, cereals, or juices with added vitamin D. Check the label. ? Fish, such as salmon or trout. ? Eggs. ? Oysters. ? Mushrooms. The items listed above may not be a complete list of what you can eat and drink. Contact a dietitian for more options. General instructions  Take medicines and supplements only as told by your doctor.  Get regular, safe exposure to natural sunlight.  Do not use a tanning bed.  Maintain a healthy weight. Lose weight if needed.  Keep all follow-up visits as told by your doctor. This is important. How is this prevented?  You can get vitamin D by: ? Eating foods that naturally contain vitamin D. ? Eating or drinking products that have vitamin D added to them, such as cereals, juices, and milk. ? Taking vitamin D or a multivitamin that contains vitamin D. ? Being in the sun. Your body makes vitamin D when your skin is exposed to sunlight. Your body changes the sunlight into a form of the vitamin that it can use. Contact a doctor if:  Your symptoms do not go away.  You feel sick to your stomach (nauseous).  You throw up (vomit).  You poop less often than normal, or you have trouble pooping (constipation). Summary  Vitamin D deficiency is when your body does not have enough vitamin D.  Vitamin D helps to keep your bones strong and healthy.  This condition is often treated by taking a supplement.  Your doctor will tell you what dose is best for you. This information is not intended to replace advice given to you by your health care provider. Make sure you discuss any questions you have with your health care provider. Document Revised: 04/14/2018 Document Reviewed: 04/14/2018 Elsevier Patient Education   [place].

## 2020-01-04 NOTE — Assessment & Plan Note (Signed)
Presents with f/u for home physical therapy for difficulty with ambulation due to heart failure and morbid obesity. Currently on carvedilol, furosemide, Entresto and aldactone. Follows with heart failure clinic with Dr.McLean. Appear euvolemic on exam this visit. Current weight 300lbs but also working with weight-loss clinic for assistance. Also met with home pt who recommend Rolator for assistance w/ mobility.  - C/w current regimen - BMP - Rolator order added

## 2020-01-04 NOTE — Assessment & Plan Note (Signed)
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 On ace/arb, spironolactone and allopurinol and furosemide 63m. Stable for now but will need to d/c nephrotoxic meds if renal fx worsens. Currently appear euvolemic.   - BMP

## 2020-01-04 NOTE — Assessment & Plan Note (Signed)
Pt requires refills on medications with associated diagnosis above.  Reviewed disease process and find this medication to be necessary, will not change dose or alter current therapy. 

## 2020-01-05 DIAGNOSIS — E785 Hyperlipidemia, unspecified: Secondary | ICD-10-CM | POA: Diagnosis not present

## 2020-01-05 DIAGNOSIS — M109 Gout, unspecified: Secondary | ICD-10-CM | POA: Diagnosis not present

## 2020-01-05 DIAGNOSIS — I5042 Chronic combined systolic (congestive) and diastolic (congestive) heart failure: Secondary | ICD-10-CM | POA: Diagnosis not present

## 2020-01-05 DIAGNOSIS — G8929 Other chronic pain: Secondary | ICD-10-CM | POA: Diagnosis not present

## 2020-01-05 DIAGNOSIS — I428 Other cardiomyopathies: Secondary | ICD-10-CM | POA: Diagnosis not present

## 2020-01-05 DIAGNOSIS — I130 Hypertensive heart and chronic kidney disease with heart failure and stage 1 through stage 4 chronic kidney disease, or unspecified chronic kidney disease: Secondary | ICD-10-CM | POA: Diagnosis not present

## 2020-01-05 DIAGNOSIS — N182 Chronic kidney disease, stage 2 (mild): Secondary | ICD-10-CM | POA: Diagnosis not present

## 2020-01-05 DIAGNOSIS — F329 Major depressive disorder, single episode, unspecified: Secondary | ICD-10-CM | POA: Diagnosis not present

## 2020-01-05 LAB — BMP8+ANION GAP
Anion Gap: 16 mmol/L (ref 10.0–18.0)
BUN/Creatinine Ratio: 31 — ABNORMAL HIGH (ref 12–28)
BUN: 54 mg/dL — ABNORMAL HIGH (ref 8–27)
CO2: 21 mmol/L (ref 20–29)
Calcium: 10 mg/dL (ref 8.7–10.3)
Chloride: 107 mmol/L — ABNORMAL HIGH (ref 96–106)
Creatinine, Ser: 1.76 mg/dL — ABNORMAL HIGH (ref 0.57–1.00)
GFR calc Af Amer: 34 mL/min/{1.73_m2} — ABNORMAL LOW (ref >59)
GFR calc non Af Amer: 29 mL/min/{1.73_m2} — ABNORMAL LOW (ref >59)
Glucose: 89 mg/dL (ref 65–99)
Potassium: 5 mmol/L (ref 3.5–5.2)
Sodium: 144 mmol/L (ref 134–144)

## 2020-01-05 LAB — CBC WITH DIFFERENTIAL/PLATELET
Basophils Absolute: 0 10*3/uL (ref 0.0–0.2)
Basos: 1 %
EOS (ABSOLUTE): 0.1 10*3/uL (ref 0.0–0.4)
Eos: 5 %
Hematocrit: 36.9 % (ref 34.0–46.6)
Hemoglobin: 12.5 g/dL (ref 11.1–15.9)
Immature Grans (Abs): 0 10*3/uL (ref 0.0–0.1)
Immature Granulocytes: 0 %
Lymphocytes Absolute: 0.6 10*3/uL — ABNORMAL LOW (ref 0.7–3.1)
Lymphs: 25 %
MCH: 31.2 pg (ref 26.6–33.0)
MCHC: 33.9 g/dL (ref 31.5–35.7)
MCV: 92 fL (ref 79–97)
Monocytes Absolute: 0.4 10*3/uL (ref 0.1–0.9)
Monocytes: 16 %
Neutrophils Absolute: 1.3 10*3/uL — ABNORMAL LOW (ref 1.4–7.0)
Neutrophils: 53 %
Platelets: 156 10*3/uL (ref 150–450)
RBC: 4.01 x10E6/uL (ref 3.77–5.28)
RDW: 12.4 % (ref 11.7–15.4)
WBC: 2.5 10*3/uL — CL (ref 3.4–10.8)

## 2020-01-05 NOTE — Telephone Encounter (Signed)
Community message sent to Skeet Latch at [facility] for [place]. F2F was yesterday. Hubbard Hartshorn, BSN, RN-BC

## 2020-01-05 NOTE — Telephone Encounter (Signed)
Theophilus Bones, RN; Skeet Latch; Loomis, Delaney Meigs, [place]; Coker, Mamie C, [place]   Got it, thanks!

## 2020-01-05 NOTE — Progress Notes (Signed)
Internal Medicine Clinic Attending  Case discussed with Dr. Lee at the time of the visit.  We reviewed the resident's history and exam and pertinent patient test results.  I agree with the assessment, diagnosis, and plan of care documented in the resident's note.  Eliodoro Gullett, M.D., Ph.D.  

## 2020-01-07 DIAGNOSIS — F329 Major depressive disorder, single episode, unspecified: Secondary | ICD-10-CM | POA: Diagnosis not present

## 2020-01-07 DIAGNOSIS — E785 Hyperlipidemia, unspecified: Secondary | ICD-10-CM | POA: Diagnosis not present

## 2020-01-07 DIAGNOSIS — N182 Chronic kidney disease, stage 2 (mild): Secondary | ICD-10-CM | POA: Diagnosis not present

## 2020-01-07 DIAGNOSIS — M109 Gout, unspecified: Secondary | ICD-10-CM | POA: Diagnosis not present

## 2020-01-07 DIAGNOSIS — G8929 Other chronic pain: Secondary | ICD-10-CM | POA: Diagnosis not present

## 2020-01-07 DIAGNOSIS — I5042 Chronic combined systolic (congestive) and diastolic (congestive) heart failure: Secondary | ICD-10-CM | POA: Diagnosis not present

## 2020-01-07 DIAGNOSIS — I428 Other cardiomyopathies: Secondary | ICD-10-CM | POA: Diagnosis not present

## 2020-01-07 DIAGNOSIS — I130 Hypertensive heart and chronic kidney disease with heart failure and stage 1 through stage 4 chronic kidney disease, or unspecified chronic kidney disease: Secondary | ICD-10-CM | POA: Diagnosis not present

## 2020-01-08 ENCOUNTER — Encounter: Payer: Self-pay | Admitting: Internal Medicine

## 2020-01-08 ENCOUNTER — Other Ambulatory Visit: Payer: Self-pay | Admitting: Internal Medicine

## 2020-01-08 DIAGNOSIS — Z6841 Body Mass Index (BMI) 40.0 and over, adult: Secondary | ICD-10-CM

## 2020-01-08 MED ORDER — OMEPRAZOLE 20 MG PO CPDR: 20.0000 mg | DELAYED_RELEASE_CAPSULE | Freq: Every day | ORAL | 1 refills | Status: DC

## 2020-01-08 NOTE — Telephone Encounter (Addendum)
Discussed with Robin Hardin regarding her continued leukopenia. Discussed timing of development of her leukopenia with coinciding with increase in her Entresto from 24-49m formulation to 49-575mdose in September of 2019. Discussed no indication for further work-up or treatment as long as she does not have active infection. Discussed red-flag symptoms and need to go to ED urgently if developing signs of infection. Discussed option of trial of reducing her Entresto dose and seeing if her white count recovers. Robin Hardin states she will try this and follow up with her cardiologist.

## 2020-01-08 NOTE — Telephone Encounter (Signed)
Attempted to call [name] on both home and mobile number. Patient did not pick up. Voicemail full.

## 2020-01-10 ENCOUNTER — Other Ambulatory Visit (HOSPITAL_COMMUNITY): Payer: Self-pay | Admitting: Psychiatry

## 2020-01-10 DIAGNOSIS — F330 Major depressive disorder, recurrent, mild: Secondary | ICD-10-CM

## 2020-01-21 ENCOUNTER — Other Ambulatory Visit: Payer: Self-pay

## 2020-01-21 ENCOUNTER — Ambulatory Visit
Admission: RE | Admit: 2020-01-21 | Discharge: 2020-01-21 | Disposition: A | Discharge status: Home / Self Care | Payer: Medicare HMO | Source: Ambulatory Visit | Attending: Internal Medicine | Admitting: Internal Medicine

## 2020-01-21 DIAGNOSIS — Z1231 Encounter for screening mammogram for malignant neoplasm of breast: Secondary | ICD-10-CM

## 2020-01-22 DIAGNOSIS — M109 Gout, unspecified: Secondary | ICD-10-CM | POA: Diagnosis not present

## 2020-01-22 DIAGNOSIS — F329 Major depressive disorder, single episode, unspecified: Secondary | ICD-10-CM | POA: Diagnosis not present

## 2020-01-22 DIAGNOSIS — I428 Other cardiomyopathies: Secondary | ICD-10-CM | POA: Diagnosis not present

## 2020-01-22 DIAGNOSIS — G8929 Other chronic pain: Secondary | ICD-10-CM | POA: Diagnosis not present

## 2020-01-22 DIAGNOSIS — E785 Hyperlipidemia, unspecified: Secondary | ICD-10-CM | POA: Diagnosis not present

## 2020-01-22 DIAGNOSIS — N182 Chronic kidney disease, stage 2 (mild): Secondary | ICD-10-CM | POA: Diagnosis not present

## 2020-01-22 DIAGNOSIS — I5042 Chronic combined systolic (congestive) and diastolic (congestive) heart failure: Secondary | ICD-10-CM | POA: Diagnosis not present

## 2020-01-22 DIAGNOSIS — I130 Hypertensive heart and chronic kidney disease with heart failure and stage 1 through stage 4 chronic kidney disease, or unspecified chronic kidney disease: Secondary | ICD-10-CM | POA: Diagnosis not present

## 2020-01-25 ENCOUNTER — Other Ambulatory Visit: Payer: Self-pay | Admitting: Internal Medicine

## 2020-01-25 ENCOUNTER — Ambulatory Visit (INDEPENDENT_AMBULATORY_CARE_PROVIDER_SITE_OTHER): Payer: Medicare HMO | Admitting: Family Medicine

## 2020-01-25 DIAGNOSIS — R928 Other abnormal and inconclusive findings on diagnostic imaging of breast: Secondary | ICD-10-CM

## 2020-01-26 ENCOUNTER — Telehealth (INDEPENDENT_AMBULATORY_CARE_PROVIDER_SITE_OTHER): Payer: Medicare HMO | Admitting: Psychiatry

## 2020-01-26 ENCOUNTER — Other Ambulatory Visit: Payer: Self-pay

## 2020-01-26 DIAGNOSIS — F419 Anxiety disorder, unspecified: Secondary | ICD-10-CM

## 2020-01-26 DIAGNOSIS — F330 Major depressive disorder, recurrent, mild: Secondary | ICD-10-CM

## 2020-01-26 MED ORDER — TRAZODONE HCL 100 MG PO TABS: 100.0000 mg | ORAL_TABLET | Freq: Every day | ORAL | 0 refills | Status: DC

## 2020-01-26 MED ORDER — DULOXETINE HCL 60 MG PO CPEP: 60.0000 mg | ORAL_CAPSULE | Freq: Every day | ORAL | 0 refills | Status: DC

## 2020-01-26 NOTE — Progress Notes (Signed)
Virtual Visit via Telephone Note  I connected with Robin Hardin on 01/26/20 at  2:00 PM EDT by telephone and verified that I am speaking with the correct person using two identifiers.   I discussed the limitations, risks, security and privacy concerns of performing an evaluation and management service by telephone and the availability of in person appointments. I also discussed with the patient that there may be a patient responsible charge related to this service. The patient expressed understanding and agreed to proceed.  Patient location; home Provider location; home office  History of Present Illness: Patient is evaluated by phone session.  She is sad because recently her labs shows low WBC count and one of the doctor told that she may have cancer however other doctor told it could be the medication side effects and they are working to rule out the reason for low WBC count.  Patient told she was doing fine but but this feels she is sad and depressed.  She is sleeping okay with the trazodone.  She had a good support from her daughter who lives in [place].  She also worried about her husband who started falling again and she worried about his health.  Patient does not want to change medication.  She feels her current medicine is working.  She is also not interested in therapy at this time but agree if needed then she will call us back.  She has no tremors shakes or any EPS.   Past Psychiatric History: H/O depression and anxiety. No h/oinpatient, psychosis, mania andsuicidal attempt.TookProzacbut quit working.Wellbutrin, Rexulti, celexa, Ambien and abilify did not work.Remeronand Amitriptylinecaused weight gain. [place] not approved.   Recent Results (from the past 2160 hour(s))  Comprehensive metabolic panel     Status: Abnormal   Collection Time: 12/15/19 12:41 PM  Result Value Ref Range   Glucose 86 65 - 99 mg/dL   BUN 31 (H) 8 - 27 mg/dL   Creatinine, Ser 1.56 (H) 0.57 - 1.00  mg/dL   GFR calc non Af Amer 34 (L) >59 mL/min/1.73   GFR calc Af Amer 39 (L) >59 mL/min/1.73    Comment: **[facility] currently reports eGFR in compliance with the current**   recommendations of the [employer]. [facility] will   update reporting as new guidelines are published from the NKF-ASN   Task force.    BUN/Creatinine Ratio 20 12 - 28   Sodium 142 134 - 144 mmol/L   Potassium 4.3 3.5 - 5.2 mmol/L   Chloride 104 96 - 106 mmol/L   CO2 21 20 - 29 mmol/L   Calcium 9.8 8.7 - 10.3 mg/dL   Total Protein 7.1 6.0 - 8.5 g/dL   Albumin 4.6 3.8 - 4.8 g/dL   Globulin, Total 2.5 1.5 - 4.5 g/dL   Albumin/Globulin Ratio 1.8 1.2 - 2.2   Bilirubin Total 0.3 0.0 - 1.2 mg/dL   Alkaline Phosphatase 72 39 - 117 IU/L   AST 21 0 - 40 IU/L   ALT 23 0 - 32 IU/L  CBC with Differential/Platelet     Status: Abnormal   Collection Time: 12/15/19 12:41 PM  Result Value Ref Range   WBC 2.5 (LL) 3.4 - 10.8 x10E3/uL   RBC 4.16 3.77 - 5.28 x10E6/uL   Hemoglobin 13.3 11.1 - 15.9 g/dL   MCV 94 79 - 97 fL   MCH 32.0 26.6 - 33.0 pg   MCHC 34.0 31.5 - 35.7 g/dL   RDW 12.0 11.7 - 15.4 %  Platelets 155 150 - 450 x10E3/uL   Neutrophils 64 Not Estab. %   Lymphs 19 Not Estab. %   Monocytes 11 Not Estab. %   Eos 5 Not Estab. %   Basos 1 Not Estab. %   Neutrophils Absolute 1.6 1.4 - 7.0 x10E3/uL   Lymphocytes Absolute 0.5 (L) 0.7 - 3.1 x10E3/uL   Monocytes Absolute 0.3 0.1 - 0.9 x10E3/uL   EOS (ABSOLUTE) 0.1 0.0 - 0.4 x10E3/uL   Basophils Absolute 0.0 0.0 - 0.2 x10E3/uL   Immature Granulocytes 0 Not Estab. %   Immature Grans (Abs) 0.0 0.0 - 0.1 x10E3/uL  Hemoglobin A1c     Status: None   Collection Time: 12/15/19 12:41 PM  Result Value Ref Range   Hgb A1c MFr Bld 5.3 4.8 - 5.6 %    Comment:          Prediabetes: 5.7 - 6.4          Diabetes: >6.4          Glycemic control for adults with diabetes: <7.0    Est. average glucose Bld gHb Est-mCnc 105 mg/dL  Insulin, random     Status: None    Collection Time: 12/15/19 12:41 PM  Result Value Ref Range   INSULIN 6.9 2.6 - 24.9 uIU/mL  Lipid panel     Status: Abnormal   Collection Time: 12/15/19 12:41 PM  Result Value Ref Range   Cholesterol, Total 196 100 - 199 mg/dL   Triglycerides 78 0 - 149 mg/dL   HDL 68 >39 mg/dL   VLDL Cholesterol Cal 14 5 - 40 mg/dL   LDL Chol Calc (NIH) 114 (H) 0 - 99 mg/dL   Chol/HDL Ratio 2.9 0.0 - 4.4 ratio    Comment:                                   T. Chol/HDL Ratio                                             Men  Women                               1/2 Avg.Risk  3.4    3.3                                   Avg.Risk  5.0    4.4                                2X Avg.Risk  9.6    7.1                                3X Avg.Risk 23.4   11.0   VITAMIN D 25 Hydroxy (Vit-D Deficiency, Fractures)     Status: Abnormal   Collection Time: 12/15/19 12:41 PM  Result Value Ref Range   Vit D, 25-Hydroxy 26.6 (L) 30.0 - 100.0 ng/mL    Comment: Vitamin D deficiency has been defined by the [place] practice guideline as  a level of serum 25-OH vitamin D less than 20 ng/mL (1,2). The Endocrine Society went on to further define vitamin D insufficiency as a level between 21 and 29 ng/mL (2). 1. IOM (Institute of Medicine). 2010. Dietary reference    intakes for calcium and D. [place]: The    [employer]. 2. Holick MF, Binkley [place], Bischoff-Ferrari HA, et al.    Evaluation, treatment, and prevention of vitamin D    deficiency: an Endocrine Society clinical practice    guideline. JCEM. 2011 Jul; 96(7):1911-30.   T4, free     Status: None   Collection Time: 12/15/19 12:41 PM  Result Value Ref Range   Free T4 1.53 0.82 - 1.77 ng/dL  T3     Status: None   Collection Time: 12/15/19 12:41 PM  Result Value Ref Range   T3, Total 100 71 - 180 ng/dL  TSH     Status: None   Collection Time: 12/15/19 12:41 PM  Result Value Ref Range   TSH 1.500 0.450 - 4.500  uIU/mL  Anemia panel     Status: Abnormal   Collection Time: 12/15/19 12:41 PM  Result Value Ref Range   Total Iron Binding Capacity 354 250 - 450 ug/dL   UIBC 270 118 - 369 ug/dL   Iron 84 27 - 139 ug/dL   Iron Saturation 24 15 - 55 %   Vitamin B-12 1,289 (H) 232 - 1,245 pg/mL   Folate, Hemolysate 465.0 Not Estab. ng/mL   Hematocrit 39.1 34.0 - 46.6 %   Folate, RBC 1,189 >498 ng/mL   Ferritin 48 15 - 150 ng/mL   Retic Ct Pct 0.7 0.6 - 2.6 %  CBC with Diff     Status: Abnormal   Collection Time: 01/04/20 11:06 AM  Result Value Ref Range   WBC 2.5 (LL) 3.4 - 10.8 x10E3/uL   RBC 4.01 3.77 - 5.28 x10E6/uL   Hemoglobin 12.5 11.1 - 15.9 g/dL   Hematocrit 36.9 34.0 - 46.6 %   MCV 92 79 - 97 fL   MCH 31.2 26.6 - 33.0 pg   MCHC 33.9 31.5 - 35.7 g/dL   RDW 12.4 11.7 - 15.4 %   Platelets 156 150 - 450 x10E3/uL   Neutrophils 53 Not Estab. %   Lymphs 25 Not Estab. %   Monocytes 16 Not Estab. %   Eos 5 Not Estab. %   Basos 1 Not Estab. %   Neutrophils Absolute 1.3 (L) 1.4 - 7.0 x10E3/uL   Lymphocytes Absolute 0.6 (L) 0.7 - 3.1 x10E3/uL   Monocytes Absolute 0.4 0.1 - 0.9 x10E3/uL   EOS (ABSOLUTE) 0.1 0.0 - 0.4 x10E3/uL   Basophils Absolute 0.0 0.0 - 0.2 x10E3/uL   Immature Granulocytes 0 Not Estab. %   Immature Grans (Abs) 0.0 0.0 - 0.1 x10E3/uL  BMP8+Anion Gap     Status: Abnormal   Collection Time: 01/04/20 11:06 AM  Result Value Ref Range   Glucose 89 65 - 99 mg/dL   BUN 54 (H) 8 - 27 mg/dL   Creatinine, Ser 1.76 (H) 0.57 - 1.00 mg/dL   GFR calc non Af Amer 29 (L) >59 mL/min/1.73   GFR calc Af Amer 34 (L) >59 mL/min/1.73    Comment: **[facility] currently reports eGFR in compliance with the current**   recommendations of the [employer]. [facility] will   update reporting as new guidelines are published from the NKF-ASN   Task force.    BUN/Creatinine Ratio 31 (H)  12 - 28   Sodium 144 134 - 144 mmol/L   Potassium 5.0 3.5 - 5.2 mmol/L   Chloride 107 (H) 96 - 106  mmol/L   CO2 21 20 - 29 mmol/L   Anion Gap 16.0 10.0 - 18.0 mmol/L   Calcium 10.0 8.7 - 10.3 mg/dL      Psychiatric Specialty Exam: Physical Exam  Review of Systems  There were no vitals taken for this visit.There is no height or weight on file to calculate BMI.  General Appearance: NA  Eye Contact:  NA  Speech:  Slow  Volume:  Decreased  Mood:  Dysphoric  Affect:  NA  Thought Process:  Goal Directed  Orientation:  Full (Time, Place, and Person)  Thought Content:  Rumination  Suicidal Thoughts:  No  Homicidal Thoughts:  No  Robin:  Immediate;   Fair Recent;   Fair Remote;   Good  Judgement:  Intact  Insight:  Present  Psychomotor Activity:  NA  Concentration:  Concentration: Fair and Attention Span: Fair  Recall:  [employer] of Knowledge:  Good  Language:  Good  Akathisia:  No  Handed:  Right  AIMS (if indicated):     Assets:  Communication Skills Desire for Improvement Housing Resilience Social Support  ADL's:  Intact  Cognition:  WNL  Sleep:   good      Assessment and Plan: Major depressive disorder, recurrent.  Anxiety.  I reviewed blood work results.  Her WBC count is 2.5 and creatinine 1.76.  She is in the process of a rule out other health issues.  Reassurance given.  Patient does not want to change or increase the medication.  I also offered therapy but at this time patient does not feel she needed.  She like to keep her current medication.  Continue Cymbalta 60 mg daily and trazodone 100 mg at bedtime.  I recommend to call us back if she is any question or any concern.  Follow-up in 3 months.  Follow Up Instructions:    I discussed the assessment and treatment plan with the patient. The patient was provided an opportunity to ask questions and all were answered. The patient agreed with the plan and demonstrated an understanding of the instructions.   The patient was advised to call back or seek an in-person evaluation if the symptoms worsen or if the  condition fails to improve as anticipated.  I provided 20 minutes of non-face-to-face time during this encounter.   Kathlee Nations, MD

## 2020-02-02 ENCOUNTER — Other Ambulatory Visit: Payer: Self-pay | Admitting: Internal Medicine

## 2020-02-02 ENCOUNTER — Ambulatory Visit
Admission: RE | Admit: 2020-02-02 | Discharge: 2020-02-02 | Disposition: A | Discharge status: Home / Self Care | Payer: Medicare HMO | Source: Ambulatory Visit | Attending: Internal Medicine | Admitting: Internal Medicine

## 2020-02-02 ENCOUNTER — Other Ambulatory Visit: Payer: Self-pay

## 2020-02-02 DIAGNOSIS — N6489 Other specified disorders of breast: Secondary | ICD-10-CM | POA: Diagnosis not present

## 2020-02-02 DIAGNOSIS — N6320 Unspecified lump in the left breast, unspecified quadrant: Secondary | ICD-10-CM

## 2020-02-02 DIAGNOSIS — R928 Other abnormal and inconclusive findings on diagnostic imaging of breast: Secondary | ICD-10-CM

## 2020-02-03 ENCOUNTER — Telehealth (HOSPITAL_COMMUNITY): Payer: Self-pay | Admitting: Licensed Clinical Social Worker

## 2020-02-03 NOTE — Telephone Encounter (Signed)
CSW received call from pt requesting helping reordering her [name] through [employer]- states she has been trying to reorder but can't get through to anyone successfully.  CSW assisted pt in calling the [facility] pharmacy and they were able to set up delivery for this Friday  No further needs at this time- CSW will continue to follow and assist as needed  Jorge Ny, [facility] Desk#: [phone] Cell#: [phone]

## 2020-02-11 ENCOUNTER — Other Ambulatory Visit: Payer: Self-pay

## 2020-02-11 ENCOUNTER — Ambulatory Visit
Admission: RE | Admit: 2020-02-11 | Discharge: 2020-02-11 | Disposition: A | Discharge status: Home / Self Care | Payer: Medicare HMO | Source: Ambulatory Visit | Attending: Internal Medicine | Admitting: Internal Medicine

## 2020-02-11 DIAGNOSIS — N6320 Unspecified lump in the left breast, unspecified quadrant: Secondary | ICD-10-CM

## 2020-02-11 DIAGNOSIS — N6321 Unspecified lump in the left breast, upper outer quadrant: Secondary | ICD-10-CM | POA: Diagnosis not present

## 2020-02-11 DIAGNOSIS — C50812 Malignant neoplasm of overlapping sites of left female breast: Secondary | ICD-10-CM | POA: Diagnosis not present

## 2020-02-12 ENCOUNTER — Encounter: Payer: Self-pay | Admitting: *Deleted

## 2020-02-15 ENCOUNTER — Other Ambulatory Visit: Payer: Self-pay | Admitting: *Deleted

## 2020-02-15 DIAGNOSIS — Z170 Estrogen receptor positive status [ER+]: Secondary | ICD-10-CM | POA: Insufficient documentation

## 2020-02-16 ENCOUNTER — Other Ambulatory Visit: Payer: Self-pay

## 2020-02-16 ENCOUNTER — Encounter (INDEPENDENT_AMBULATORY_CARE_PROVIDER_SITE_OTHER): Payer: Self-pay | Admitting: Family Medicine

## 2020-02-16 ENCOUNTER — Ambulatory Visit (INDEPENDENT_AMBULATORY_CARE_PROVIDER_SITE_OTHER): Payer: Medicare HMO | Admitting: Family Medicine

## 2020-02-16 VITALS — BP 128/75 | HR 58 | Temp 99.0°F | Ht 63.0 in | Wt 291.0 lb

## 2020-02-16 DIAGNOSIS — I5042 Chronic combined systolic (congestive) and diastolic (congestive) heart failure: Secondary | ICD-10-CM

## 2020-02-16 DIAGNOSIS — F419 Anxiety disorder, unspecified: Secondary | ICD-10-CM

## 2020-02-16 DIAGNOSIS — Z6841 Body Mass Index (BMI) 40.0 and over, adult: Secondary | ICD-10-CM | POA: Diagnosis not present

## 2020-02-16 DIAGNOSIS — R6889 Other general symptoms and signs: Secondary | ICD-10-CM | POA: Diagnosis not present

## 2020-02-16 NOTE — Progress Notes (Signed)
[facility] NOTE  Patient Care Team: Mosetta Anis, MD as PCP - General Rockwell Germany, RN as Oncology Nurse Navigator Mauro Kaufmann, RN as Oncology Nurse Navigator Coralie Keens, MD as Consulting Physician (General Surgery) Nicholas Lose, MD as Consulting Physician (Hematology and Oncology) Gery Pray, MD as Consulting Physician (Radiation Oncology)  CHIEF COMPLAINTS/PURPOSE OF CONSULTATION:  Newly diagnosed breast cancer  HISTORY OF PRESENTING ILLNESS:  Robin Hardin 69 y.o. female is here because of recent diagnosis of left breast invasive ductal carcinoma. Screening mammogram on 01/21/20 showed a left breast mass and calcifications. Diagnostic mammogram and [place] on 02/02/20 showed a 1.3cm mass at the 3 o'clock position in the left breast with no left axillary adenopathy. Biopsy on 02/11/20 showed invasive ductal carcinoma with DCIS, grade 2, HER-2 negative (1+), ER+ 95%, PR+ 95%, Ki67 10%. She presents to the clinic today for initial evaluation and discussion of treatment options.   I reviewed her records extensively and collaborated the history with the patient.  SUMMARY OF ONCOLOGIC HISTORY: Oncology History  Malignant neoplasm of upper-outer quadrant of left breast in female, estrogen receptor positive ([place])  02/15/2020 Initial Diagnosis   Screening mammogram showed a left breast mass and calcifications. Diagnostic mammogram and US showed a 1.3cm mass at the 3 o'clock position in the left breast, no axillary adenopathy. Biopsy showed IDC with DCIS, grade 2, HER-2 - (1+), ER+ 95%, PR+ 95%, Ki67 10%.     MEDICAL HISTORY:  Past Medical History:  Diagnosis Date  . Acute respiratory failure with hypoxia ([place]) 09/26/2012  . Anxiety   . Arthritis   . Back pain   . Bilateral knee pain   . Cellulitis of lower leg    left  . Chest pain   . Chronic combined systolic and diastolic congestive heart failure ([place]) 10/10/2012   2 D echo 09/2012:  Left  ventricle: Extremely poor acoustic windows limit study. Overall LVEF appears to be severely depressed. Recomm ordering limited study with contrast to further evaluate LV systolic function. The cavity size was normal. Wall thickness was normal. Doppler parameters are consistent with abnormal left ventricular relaxation (grade 1 diastolic dysfunction).   2 D echo 03/2009 :  Left ventricle: The cavity size was normal. There was mild concentric hypertrophy. Systolic function was mildly reduced. The estimated ejection fraction was in the range of 45% to 50%. Diffuse hypokinesis. Doppler parameters are consistent with abnormal left ventricular relaxation (grade 1 diastolic dysfunction).     . Chronic kidney disease 10/10/2012   Stage 2 with GFR of 64   . Constipation   . Depression   . Edema, lower extremity   . Gout   . H/O: hysterectomy   . History of heart attack   . Hyperlipidemia   . Hypertension   . Joint pain   . Malignant hypertension 09/26/2012  . Morbid obesity ([place])   . Nonischemic cardiomyopathy ([place])   . Shortness of breath   . Sleep apnea    on cpap  . Unspecified essential hypertension 03/22/2009   Qualifier: Diagnosis of  By: Karrie Meres RN, BSN, [name]    . Vitamin D deficiency     SURGICAL HISTORY: Past Surgical History:  Procedure Laterality Date  . fibroid tumor removal  2004    SOCIAL HISTORY: Social History   Socioeconomic History  . Marital status: Married    Spouse name: Not on file  . Number of children: 2  . Years of education: Not on  file  . Highest education level: Not on file  Occupational History  . Occupation: retired Forensic psychologist: UNEMPLOYED  Tobacco Use  . Smoking status: Never Smoker  . Smokeless tobacco: Never Used  Vaping Use  . Vaping Use: Never used  Substance and Sexual Activity  . Alcohol use: No    Alcohol/week: 0.0 standard drinks  . Drug use: No  . Sexual activity: Not Currently    Partners: Male  Other Topics Concern  . 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 Difficulty of Paying Living Expenses:   Food Insecurity:   . Worried About Charity fundraiser in the Last Year:   . Arboriculturist in the Last Year:   Transportation Needs:   . Film/video editor (Medical):   Marland Kitchen Lack of Transportation (Non-Medical):   Physical Activity:   . Days of Exercise per Week:   . Minutes of Exercise per Session:   Stress:   . Feeling of Stress :   Social Connections:   . Frequency of Communication with Friends and Family:   . Frequency of Social Gatherings with Friends and Family:   . Attends Religious Services:   . Active Member of Clubs or Organizations:   . Attends Archivist Meetings:   Marland Kitchen Marital Status:   Intimate Partner Violence:   . Fear of Current or Ex-Partner:   . Emotionally Abused:   Marland Kitchen Physically Abused:   . Sexually Abused:     FAMILY HISTORY: Family History  Problem Relation Age of Onset  . Asthma Mother   . Heart disease Mother   . Stroke Mother        clotting disorders  . Hypertension Mother   . Anxiety disorder Mother   . Depression Mother   . Sleep apnea Mother   . Obesity Mother   . Asthma Brother   . Asthma Brother   . Obesity Father     ALLERGIES:  is allergic to abilify [aripiprazole].  MEDICATIONS:  Current Outpatient Medications  Medication Sig Dispense Refill  . allopurinol (ZYLOPRIM) 300 MG tablet Take 1 tablet (300 mg total) by mouth daily. 90 tablet 3  . ALOE PO Take 1 capsule by mouth 2 (two) times daily.    Marland Kitchen aspirin 81 MG EC tablet Take 1 tablet (81 mg total) by mouth daily. 90 tablet 3  . carvedilol (COREG) 25 MG tablet Take 1 tablet (25 mg total) by mouth 2 (two) times daily with a meal. 180 tablet  3  . DULoxetine (CYMBALTA) 60 MG capsule Take 1 capsule (60 mg total) by mouth daily. 90 capsule 0  . furosemide (LASIX) 40 MG tablet Take 0.5 tablets (20 mg total) by mouth every other day. 45 tablet 3  . metFORMIN (GLUCOPHAGE) 500 MG tablet TAKE 1 TABLET TWICE DAILY WITH MEALS 180 tablet 1  . methocarbamol (ROBAXIN) 500 MG tablet Take 1 tablet (500 mg total) by mouth every 8 (eight) hours as needed for muscle spasms. 60 tablet 2  . omeprazole (PRILOSEC) 20 MG capsule Take 1 capsule (20 mg total) by mouth daily. 90 capsule 1  .  potassium chloride SA (KLOR-CON) 20 MEQ tablet Take 1 tablet (20 mEq total) by mouth daily. 90 tablet 3  . rosuvastatin (CRESTOR) 20 MG tablet Take 1 tablet (20 mg total) by mouth daily. 90 tablet 3  . sacubitril-valsartan (ENTRESTO) 49-51 MG Take 1 tablet by mouth 2 (two) times daily. 180 tablet 3  . spironolactone (ALDACTONE) 25 MG tablet Take 1 tablet (25 mg total) by mouth daily. 90 tablet 3  . traZODone (DESYREL) 100 MG tablet Take 1 tablet (100 mg total) by mouth at bedtime. 90 tablet 0  . Vitamin D, Ergocalciferol, (DRISDOL) 1.25 MG (50000 UNIT) CAPS capsule Take 1 capsule (50,000 Units total) by mouth every 7 (seven) days. 4 capsule 0   No current facility-administered medications for this visit.    REVIEW OF SYSTEMS:   Constitutional: Denies fevers, chills or abnormal night sweats Eyes: Denies blurriness of vision, double vision or watery eyes Ears, nose, mouth, throat, and face: Denies mucositis or sore throat Respiratory: Denies cough, dyspnea or wheezes Cardiovascular: Denies palpitation, chest discomfort or lower extremity swelling Gastrointestinal:  Denies nausea, heartburn or change in bowel habits Skin: Denies abnormal skin rashes Lymphatics: Denies new lymphadenopathy or easy bruising Neurological:Denies numbness, tingling or new weaknesses Behavioral/Psych: Mood is stable, no new changes  Breast: Denies any palpable lumps or discharge All other  systems were reviewed with the patient and are negative.  PHYSICAL EXAMINATION: ECOG PERFORMANCE STATUS: 1 - Symptomatic but completely ambulatory  Vitals:   02/17/20 1023  BP: 111/71  Pulse: (!) 57  Resp: 17  Temp: 98.5 F (36.9 C)  SpO2: 99%   Filed Weights   02/17/20 1023  Weight: 293 lb 12.8 oz (133.3 kg)    GENERAL:alert, no distress and comfortable SKIN: skin color, texture, turgor are normal, no rashes or significant lesions EYES: normal, conjunctiva are pink and non-injected, sclera clear OROPHARYNX:no exudate, no erythema and lips, buccal mucosa, and tongue normal  NECK: supple, thyroid normal size, non-tender, without nodularity LYMPH:  no palpable lymphadenopathy in the cervical, axillary or inguinal LUNGS: clear to auscultation and percussion with normal breathing effort HEART: regular rate & rhythm and no murmurs ABDOMEN:abdomen soft, non-tender and normal bowel sounds Musculoskeletal:no cyanosis of digits and no clubbing  PSYCH: alert & oriented x 3 with fluent speech NEURO: no focal motor/sensory deficits Extremities: Lower extremity edema BREAST: No palpable nodules in breast. No palpable axillary or supraclavicular lymphadenopathy (exam performed in the presence of a chaperone)   LABORATORY DATA:  I have reviewed the data as listed Lab Results  Component Value Date   WBC 2.5 (LL) 01/04/2020   HGB 12.5 01/04/2020   HCT 36.9 01/04/2020   MCV 92 01/04/2020   PLT 156 01/04/2020   Lab Results  Component Value Date   NA 144 01/04/2020   K 5.0 01/04/2020   CL 107 (H) 01/04/2020   CO2 21 01/04/2020    RADIOGRAPHIC STUDIES: I have personally reviewed the radiological reports and agreed with the findings in the report.  ASSESSMENT AND PLAN:  Malignant neoplasm of upper-outer quadrant of left breast in female, estrogen receptor positive ([place]) 02/15/2020: Screening mammogram showed a left breast mass and calcifications. Diagnostic mammogram and US showed a  1.3cm mass at the 3 o'clock position in the left breast, no axillary adenopathy. Biopsy showed IDC with DCIS, grade 2, HER-2 - (1+), ER+ 95%, PR+ 95%, Ki67 10%.  Pathology and radiology counseling:Discussed with the patient, the details of pathology including the type of breast cancer,the clinical  staging, the significance of ER, PR and HER-2/neu receptors and the implications for treatment. After reviewing the pathology in detail, we proceeded to discuss the different treatment options between surgery, radiation, chemotherapy, antiestrogen therapies.  Recommendations: 1. Breast conserving surgery with sentinel lymph node biopsy followed by 2. Oncotype DX testing to determine if chemotherapy would be of any benefit followed by 3. Adjuvant radiation therapy followed by 4. Adjuvant antiestrogen therapy  Oncotype counseling: I discussed Oncotype DX test. I explained to the patient that this is a 21 gene panel to evaluate patient tumors DNA to calculate recurrence score. This would help determine whether patient has high risk or intermediate risk or low risk breast cancer. She understands that if her tumor was found to be high risk, she would benefit from systemic chemotherapy. If low risk, no need of chemotherapy. If she was found to be intermediate risk, we would need to evaluate the score as well as other risk factors and determine if an abbreviated chemotherapy may be of benefit.   Difficulty with ambulation: Patient uses a wheelchair.  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  She does not have a lot of support. Return to clinic after surgery to discuss final pathology report and then determine if Oncotype DX testing will need to be sent.  All questions were answered. The patient knows to call the clinic with any problems, questions or concerns.   Rulon Eisenmenger, MD, MPH 02/17/2020    I, Molly Dorshimer, am acting as scribe for Nicholas Lose, MD.  I have reviewed the above documentation for  accuracy and completeness, and I agree with the above.

## 2020-02-17 ENCOUNTER — Other Ambulatory Visit: Payer: Self-pay

## 2020-02-17 ENCOUNTER — Other Ambulatory Visit: Payer: Self-pay | Admitting: Surgery

## 2020-02-17 ENCOUNTER — Ambulatory Visit
Admission: RE | Admit: 2020-02-17 | Discharge: 2020-02-17 | Disposition: A | Discharge status: Home / Self Care | Payer: Medicare HMO | Source: Ambulatory Visit | Attending: Radiation Oncology | Admitting: Radiation Oncology

## 2020-02-17 ENCOUNTER — Inpatient Hospital Stay: Payer: Medicare HMO

## 2020-02-17 ENCOUNTER — Encounter: Payer: Self-pay | Admitting: Licensed Clinical Social Worker

## 2020-02-17 ENCOUNTER — Encounter: Payer: Self-pay | Admitting: Physical Therapy

## 2020-02-17 ENCOUNTER — Inpatient Hospital Stay: Payer: Medicare HMO | Attending: Hematology and Oncology | Admitting: Hematology and Oncology

## 2020-02-17 ENCOUNTER — Encounter: Payer: Self-pay | Admitting: *Deleted

## 2020-02-17 ENCOUNTER — Ambulatory Visit: Payer: Medicare HMO | Attending: Surgery | Admitting: Physical Therapy

## 2020-02-17 DIAGNOSIS — Z823 Family history of stroke: Secondary | ICD-10-CM | POA: Insufficient documentation

## 2020-02-17 DIAGNOSIS — Z170 Estrogen receptor positive status [ER+]: Secondary | ICD-10-CM

## 2020-02-17 DIAGNOSIS — R293 Abnormal posture: Secondary | ICD-10-CM | POA: Insufficient documentation

## 2020-02-17 DIAGNOSIS — C50412 Malignant neoplasm of upper-outer quadrant of left female breast: Secondary | ICD-10-CM | POA: Insufficient documentation

## 2020-02-17 DIAGNOSIS — Z8349 Family history of other endocrine, nutritional and metabolic diseases: Secondary | ICD-10-CM | POA: Insufficient documentation

## 2020-02-17 DIAGNOSIS — I5042 Chronic combined systolic (congestive) and diastolic (congestive) heart failure: Secondary | ICD-10-CM | POA: Insufficient documentation

## 2020-02-17 DIAGNOSIS — Z836 Family history of other diseases of the respiratory system: Secondary | ICD-10-CM | POA: Insufficient documentation

## 2020-02-17 DIAGNOSIS — N183 Chronic kidney disease, stage 3 unspecified: Secondary | ICD-10-CM | POA: Diagnosis not present

## 2020-02-17 DIAGNOSIS — Z8249 Family history of ischemic heart disease and other diseases of the circulatory system: Secondary | ICD-10-CM | POA: Insufficient documentation

## 2020-02-17 DIAGNOSIS — I130 Hypertensive heart and chronic kidney disease with heart failure and stage 1 through stage 4 chronic kidney disease, or unspecified chronic kidney disease: Secondary | ICD-10-CM | POA: Diagnosis not present

## 2020-02-17 DIAGNOSIS — Z90710 Acquired absence of both cervix and uterus: Secondary | ICD-10-CM | POA: Insufficient documentation

## 2020-02-17 DIAGNOSIS — Z818 Family history of other mental and behavioral disorders: Secondary | ICD-10-CM | POA: Insufficient documentation

## 2020-02-17 DIAGNOSIS — I252 Old myocardial infarction: Secondary | ICD-10-CM

## 2020-02-17 DIAGNOSIS — R262 Difficulty in walking, not elsewhere classified: Secondary | ICD-10-CM | POA: Diagnosis not present

## 2020-02-17 DIAGNOSIS — Z79899 Other long term (current) drug therapy: Secondary | ICD-10-CM | POA: Diagnosis not present

## 2020-02-17 DIAGNOSIS — C50912 Malignant neoplasm of unspecified site of left female breast: Secondary | ICD-10-CM | POA: Diagnosis not present

## 2020-02-17 DIAGNOSIS — Z853 Personal history of malignant neoplasm of breast: Secondary | ICD-10-CM

## 2020-02-17 DIAGNOSIS — M1990 Unspecified osteoarthritis, unspecified site: Secondary | ICD-10-CM | POA: Insufficient documentation

## 2020-02-17 NOTE — Therapy (Signed)
[place], [place], [address] Phone: [phone]   Fax:  [phone]  Physical Therapy Evaluation  Patient Details  Name: VAANYA SHAMBAUGH MRN: [id number] Date of Birth: May 03, 1951 Referring Provider (PT): Dr. Coralie Keens   Encounter Date: 02/17/2020   PT End of Session - 02/17/20 1340    Visit Number 1    Number of Visits 2    Date for PT Re-Evaluation 04/13/20    PT Start Time 1100    PT Stop Time 1128    PT Time Calculation (min) 28 min    Activity Tolerance Patient tolerated treatment well    Behavior During Therapy [facility] for tasks assessed/performed           Past Medical History:  Diagnosis Date  . Acute respiratory failure with hypoxia ([place]) 09/26/2012  . Anxiety   . Arthritis   . Back pain   . Bilateral knee pain   . Cellulitis of lower leg    left  . Chest pain   . Chronic combined systolic and diastolic congestive heart failure ([place]) 10/10/2012   2 D echo 09/2012:  Left ventricle: Extremely poor acoustic windows limit study. Overall LVEF appears to be severely depressed. Recomm ordering limited study with contrast to further evaluate LV systolic function. The cavity size was normal. Wall thickness was normal. Doppler parameters are consistent with abnormal left ventricular relaxation (grade 1 diastolic dysfunction).   2 D echo 03/2009 :  Left ventricle: The cavity size was normal. There was mild concentric hypertrophy. Systolic function was mildly reduced. The estimated ejection fraction was in the range of 45% to 50%. Diffuse hypokinesis. Doppler parameters are consistent with abnormal left ventricular relaxation (grade 1 diastolic dysfunction).     . Chronic kidney disease 10/10/2012   Stage 2 with GFR of 64   . Constipation   . Depression   . Edema, lower extremity   . Gout   . H/O: hysterectomy   . History of heart attack   . Hyperlipidemia   . Hypertension   . Joint pain   .  Malignant hypertension 09/26/2012  . Morbid obesity ([place])   . Nonischemic cardiomyopathy ([place])   . Shortness of breath   . Sleep apnea    on cpap  . Unspecified essential hypertension 03/22/2009   Qualifier: Diagnosis of  By: Karrie Meres RN, BSN, [name]    . Vitamin D deficiency     Past Surgical History:  Procedure Laterality Date  . fibroid tumor removal  2004    There were no vitals filed for this visit.    Subjective Assessment - 02/17/20 1233    Subjective Patient reports she is here today to be seen by her medical team for her newly diagnosed left breast cancer. She presents today in a wheelchair and reports she can only ambulate from one room in her house to the next before resting due to back pain and weakness.    Pertinent History Patient was diagnosed on 01/21/2020 with left grade II invasive ductal carcinoma breast cancer. It measures 1.3 cm and is located in the upper outer quadrant. It is ER/PR positive and HER2 negative with a Ki67 of 10%. Patient is morbidly obese and has limitations with ambulation.    Patient Stated Goals Reduce lymphedema risk and learn post op shoulder ROM HEP    Currently in Pain? Yes    Pain Score 2     Pain Location Arm    Pain  Orientation Left;Upper    Pain Descriptors / Indicators Nagging    Pain Type Chronic pain    Pain Onset More than a month ago    Pain Frequency Intermittent    Aggravating Factors  Reaching overhead    Pain Relieving Factors Rest    Effect of Pain on Daily Activities Patient also limited functionally by chronic back pain. She has received home health PT in the past for this.              [facility] PT Assessment - 02/17/20 0001      Assessment   Medical Diagnosis Left breast cancer    Referring Provider (PT) Dr. Coralie Keens    Onset Date/Surgical Date 01/21/20    Hand Dominance Left    Prior Therapy None      Precautions   Precautions Other (comment)    Precaution Comments fall risk and active cancer       Restrictions   Weight Bearing Restrictions No      Balance Screen   Has the patient fallen in the past 6 months Yes    How many times? 2   PLans to resume home health PT for this   Has the patient had a decrease in activity level because of a fear of falling?  No    Is the patient reluctant to leave their home because of a fear of falling?  No      Home Ecologist residence    Living Arrangements Spouse/significant other    Available Help at Discharge Family      Prior Function   Level of [place] device for independence    Vocation Retired    Leisure She does not currently exercise      Cognition   Overall Cognitive Status Within Functional Limits for tasks assessed      Posture/Postural Control   Posture/Postural Control Postural limitations    Postural Limitations Forward head;Rounded Shoulders      ROM / Strength   AROM / PROM / Strength AROM;Strength      AROM   Overall AROM Comments Cervical AROM is WNL    AROM Assessment Site Shoulder    Right/Left Shoulder Right;Left    Right Shoulder Extension 40 Degrees    Right Shoulder Flexion 96 Degrees    Right Shoulder ABduction 133 Degrees    Right Shoulder Internal Rotation --   Unable ot measure IR/ER bilaterally due to pain   Left Shoulder Extension 40 Degrees    Left Shoulder Flexion 74 Degrees    Left Shoulder ABduction 77 Degrees      Strength   Overall Strength Unable to assess;Due to pain             LYMPHEDEMA/ONCOLOGY QUESTIONNAIRE - 02/17/20 0001      Type   Cancer Type Left breast cancer      Lymphedema Assessments   Lymphedema Assessments Upper extremities      Right Upper Extremity Lymphedema   10 cm Proximal to Olecranon Process 50 cm    Olecranon Process 28.8 cm    10 cm Proximal to Ulnar Styloid Process 25 cm    Just Proximal to Ulnar Styloid Process 18.3 cm    Across Hand at [employer] 19.3 cm    At [place] of 2nd Digit 6.1 cm       Left Upper Extremity Lymphedema   10 cm Proximal to Olecranon Process 45 cm  Measured at skin fold   Olecranon Process 28.3 cm    10 cm Proximal to Ulnar Styloid Process 24.5 cm    Just Proximal to Ulnar Styloid Process 18.5 cm    Across Hand at [employer] 19.2 cm    At [place] of 2nd Digit 6 cm           L-DEX FLOWSHEETS - 02/17/20 1300      L-DEX LYMPHEDEMA SCREENING   Measurement Type --   Unable to measure; pt can't step up on machine; declined sit               Quick Dash - 02/17/20 0001    Open a tight or new jar Unable    Do heavy household chores (wash walls, wash floors) Moderate difficulty    Carry a shopping bag or briefcase Unable    Wash your back Mild difficulty    Use a knife to cut food No difficulty    Recreational activities in which you take some force or impact through your arm, shoulder, or hand (golf, hammering, tennis) Unable    During the past week, to what extent has your arm, shoulder or hand problem interfered with your normal social activities with family, friends, neighbors, or groups? Not at all    During the past week, to what extent has your arm, shoulder or hand problem limited your work or other regular daily activities Quite a bit    Arm, shoulder, or hand pain. Moderate    Tingling (pins and needles) in your arm, shoulder, or hand None    Difficulty Sleeping No difficulty    DASH Score 45.45 %            Objective measurements completed on examination: See above findings.        Patient was instructed today in a home exercise program today for post op shoulder range of motion. These included active assist shoulder flexion in sitting, scapular retraction, wall walking with shoulder abduction, and hands behind head external rotation.  She was encouraged to do these twice a day, holding 3 seconds and repeating 5 times when permitted by her physician.           PT Education - 02/17/20 1309    Education Details Lymphedema  risk reduction and post op shoulder ROM HEP    Person(s) Educated Patient    Methods Explanation;Demonstration;Handout    Comprehension Returned demonstration;Verbalized understanding               PT Long Term Goals - 02/17/20 1345      PT LONG TERM GOAL #1   Title Patient will demonstrate she has returned to baseline with shoulder ROM and function post operatively compared to baseline measurements.    Time 8    Period Weeks    Status New    Target Date 04/13/20           Breast Clinic Goals - 02/17/20 1345      Patient will be able to verbalize understanding of pertinent lymphedema risk reduction practices relevant to her diagnosis specifically related to skin care.   Time 1    Period Days    Status Achieved      Patient will be able to return demonstrate and/or verbalize understanding of the post-op home exercise program related to regaining shoulder range of motion.   Time 1    Period Days    Status Achieved      Patient will  be able to verbalize understanding of the importance of attending the postoperative After Breast Cancer Class for further lymphedema risk reduction education and therapeutic exercise.   Time 1    Period Days    Status Achieved                 Plan - 02/17/20 1340    Clinical Impression Statement Patient was diagnosed on 01/21/2020 with left grade II invasive ductal carcinoma breast cancer. It measures 1.3 cm and is located in the upper outer quadrant. It is ER/PR positive and HER2 negative with a Ki67 of 10%. Patient is morbidly obese and has limitations with ambulation. Her multidisciplinary medical team met prior to her assessments to determine a recommended treatment plan. She is planning to have a left lumpectomy and sentinel node biopsy followed by Oncotype testing, radiation, and anti-estrogen therapy. She will benefit from a post op PT reassessment to determine needs. The SOZO machine was not done due to inability to step up onto the  machine.    Stability/Clinical Decision Making Stable/Uncomplicated    Clinical Decision Making Low    Rehab Potential Excellent    PT Frequency --   Eval and 1 f/u visit   PT Treatment/Interventions ADLs/Self Care Home Management;Therapeutic exercise;Patient/family education    PT Next Visit Plan Will reassess 3-4 weeks post op    PT Home Exercise Plan Post op shoulder ROM HEP    Consulted and Agree with Plan of Care Patient           Patient will benefit from skilled therapeutic intervention in order to improve the following deficits and impairments:  Decreased knowledge of precautions, Impaired UE functional use, Pain, Postural dysfunction, Decreased range of motion  Visit Diagnosis: Malignant neoplasm of upper-outer quadrant of left breast in female, estrogen receptor positive ([place]) - Plan: PT plan of care cert/re-cert  Abnormal posture - Plan: PT plan of care cert/re-cert  Difficulty in walking, not elsewhere classified - Plan: PT plan of care cert/re-cert   Patient will follow up at outpatient cancer rehab 3-4 weeks following surgery.  If the patient requires physical therapy at that time, a specific plan will be dictated and sent to the referring physician for approval. The patient was educated today on appropriate basic range of motion exercises to begin post operatively and the importance of attending the After Breast Cancer class following surgery.  Patient was educated today on lymphedema risk reduction practices as it pertains to recommendations that will benefit the patient immediately following surgery.  She verbalized good understanding.      Problem List Patient Active Problem List   Diagnosis Date Noted  . Malignant neoplasm of upper-outer quadrant of left breast in female, estrogen receptor positive ([place]) 02/15/2020  . Drug-induced leukopenia ([place]) 01/04/2020  . Low back pain 11/10/2019  . Breast cancer screening by mammogram 05/13/2018  . Mobility impaired  02/04/2018  . Vitamin D deficiency 05/21/2017  . Normocytic anemia 12/28/2014  . History of diabetes mellitus 07/27/2014  . Health care maintenance 08/25/2013  . Depression 03/03/2013  . Chronic combined systolic and diastolic congestive heart failure ([place]) 10/10/2012  . CKD (chronic kidney disease) stage 3, GFR 30-59 ml/min 10/10/2012  . Gout 10/09/2012  . Hyperlipidemia 05/31/2009  . Obstructive sleep apnea 04/07/2009  . Morbid obesity with BMI of 50.0-59.9, adult ([place]) 03/22/2009  . Essential hypertension 03/22/2009   Annia Friendly, PT 02/17/20 1:49 PM  [place],  [place], [address] Phone: [phone]   Fax:  [phone]  Name: ETHELENE CLOSSER MRN: [id number] Date of Birth: 1951/06/25

## 2020-02-17 NOTE — Progress Notes (Signed)
Clinical Social Work [place] Psychosocial Distress Screening Robin Hardin   Patient completed distress screening protocol and scored a 7 on the Psychosocial Distress Thermometer which indicates moderate distress. Clinical Social Worker met with patient in [facility] to assess for distress and other psychosocial needs.  Patient was feeling overwhelmed but felt better after meeting with the treatment team and getting more information on her treatment plan.   CSW and patient discussed common feeling and emotions when being diagnosed with cancer, and the importance of support during treatment.    Barriers to care/review of distress screen:  - Transportation: Drives herself as long as there is valet parking and she can get a wheelchair once she gets to her location.  - Help at home: Limited help. 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 & source of income: husband's retirement income and small stipend for her for caregiving   Distress Screen: ONCBCN DISTRESS SCREENING 02/17/2020  Screening Type Initial Screening  Distress experienced in past week (1-10) 7  Emotional problem type Depression;Nervousness/Anxiety;Adjusting to illness  Spiritual/Religous concerns type Relating to God  Physical Problem type Pain;Getting around;Bathing/dressing;Talking;Constipation/diarrhea;Sexual problems;Skin dry/itchy;Swollen arms/legs  Physician notified of physical symptoms Yes    CSW Summary: Patient with limited identified support system at this time. Main concern is when she has surgery, how to help her husband get to his treatments. CSW encouraged patient to reach out to husband's dialysis center social worker to determine the resources available there. Patient may reach out if they are unable to give her specific resources for  their site.    CSW informed patient of the support team and support services at [facility].  CSW provided contact information and encouraged patient to call with any questions or concerns.   [place]

## 2020-02-17 NOTE — Assessment & Plan Note (Signed)
02/15/2020: Screening mammogram showed a left breast mass and calcifications. Diagnostic mammogram and US showed a 1.3cm mass at the 3 o'clock position in the left breast, no axillary adenopathy. Biopsy showed IDC with DCIS, grade 2, HER-2 - (1+), ER+ 95%, PR+ 95%, Ki67 10%.  Pathology and radiology counseling:Discussed with the patient, the details of pathology including the type of breast cancer,the clinical staging, the significance of ER, PR and HER-2/neu receptors and the implications for treatment. After reviewing the pathology in detail, we proceeded to discuss the different treatment options between surgery, radiation, chemotherapy, antiestrogen therapies.  Recommendations: 1. Breast conserving surgery with sentinel lymph node biopsy followed by 2. Oncotype DX testing to determine if chemotherapy would be of any benefit followed by 3. Adjuvant radiation therapy followed by 4. Adjuvant antiestrogen therapy  Oncotype counseling: I discussed Oncotype DX test. I explained to the patient that this is a 21 gene panel to evaluate patient tumors DNA to calculate recurrence score. This would help determine whether patient has high risk or intermediate risk or low risk breast cancer. She understands that if her tumor was found to be high risk, she would benefit from systemic chemotherapy. If low risk, no need of chemotherapy. If she was found to be intermediate risk, we would need to evaluate the score as well as other risk factors and determine if an abbreviated chemotherapy may be of benefit.  Return to clinic after surgery to discuss final pathology report and then determine if Oncotype DX testing will need to be sent.

## 2020-02-17 NOTE — Progress Notes (Signed)
Radiation Oncology         [phone] ________________________________  Multidisciplinary Breast Oncology Clinic [facility]) Initial Outpatient Consultation  Name: Robin Hardin MRN: [id number]  Date: 02/17/2020  DOB: [birth year]  CC:Lee, Dimple Nanas, MD  Coralie Keens, MD   REFERRING PHYSICIAN: Coralie Keens, MD  DIAGNOSIS: The encounter diagnosis was Malignant neoplasm of upper-outer quadrant of left breast in female, estrogen receptor positive ([place]).  Stage T1c, Nx, Mx, Left Breast OUQ, Invasive Ductal Carcinoma with DCIS, ER+ / PR+ / Her2-, Grade 2    ICD-10-CM   1. Malignant neoplasm of upper-outer quadrant of left breast in female, estrogen receptor positive ([place])  C50.412    Z17.0     HISTORY OF PRESENT ILLNESS::Robin Hardin is a 69 y.o. female who is presenting to the office today for evaluation of her newly diagnosed breast cancer. She is not accompanied by anyone. She is doing well overall.   She had routine screening mammography on 01/21/2020 that showed a possible mass with calcifications in the left breast. She underwent unilateral diagnostic mammography with tomography and left breast ultrasonography at [facility] on 02/02/2020 that showed a suspicious mass in the 3 o'clock position of the left breast.  Biopsy on 02/11/2020 showed grade 2 invasive ductal carcinoma with ductal carcinoma in-situ and calcifications. Prognostic indicators significant for estrogen receptor, 95% positive and progesterone receptor, 95% positive, both with strong staining intensities. Proliferation marker Ki67 at 10%. HER2 negative.  Menarche: 69 years old Age at first live birth: 69 years old GP: 5 LMP: N/A Contraceptive: Yes HRT: No  The patient was referred today for presentation in the multidisciplinary conference.  Radiology studies and pathology slides were presented there for review and discussion of treatment options.  A consensus was discussed regarding potential  next steps.  PREVIOUS RADIATION THERAPY: No  PAST MEDICAL HISTORY:  Past Medical History:  Diagnosis Date  . Acute respiratory failure with hypoxia ([place]) 09/26/2012  . Anxiety   . Arthritis   . Back pain   . Bilateral knee pain   . Cellulitis of lower leg    left  . Chest pain   . Chronic combined systolic and diastolic congestive heart failure ([place]) 10/10/2012   2 D echo 09/2012:  Left ventricle: Extremely poor acoustic windows limit study. Overall LVEF appears to be severely depressed. Recomm ordering limited study with contrast to further evaluate LV systolic function. The cavity size was normal. Wall thickness was normal. Doppler parameters are consistent with abnormal left ventricular relaxation (grade 1 diastolic dysfunction).   2 D echo 03/2009 :  Left ventricle: The cavity size was normal. There was mild concentric hypertrophy. Systolic function was mildly reduced. The estimated ejection fraction was in the range of 45% to 50%. Diffuse hypokinesis. Doppler parameters are consistent with abnormal left ventricular relaxation (grade 1 diastolic dysfunction).     . Chronic kidney disease 10/10/2012   Stage 2 with GFR of 64   . Constipation   . Depression   . Edema, lower extremity   . Gout   . H/O: hysterectomy   . History of heart attack   . Hyperlipidemia   . Hypertension   . Joint pain   . Malignant hypertension 09/26/2012  . Morbid obesity ([place])   . Nonischemic cardiomyopathy ([place])   . Shortness of breath   . Sleep apnea    on cpap  . Unspecified essential hypertension 03/22/2009   Qualifier: Diagnosis of  By: Karrie Meres RN, BSN, [name]    .  Vitamin D deficiency     PAST SURGICAL HISTORY: Past Surgical History:  Procedure Laterality Date  . fibroid tumor removal  2004    FAMILY HISTORY:  Family History  Problem Relation Age of Onset  . Asthma Mother   . Heart disease Mother   . Stroke Mother        clotting disorders  . Hypertension Mother   . Anxiety disorder Mother     . Depression Mother   . Sleep apnea Mother   . Obesity Mother   . Asthma Brother   . Asthma Brother   . Obesity Father     SOCIAL HISTORY:  Social History   Socioeconomic History  . Marital status: Married    Spouse name: Not on file  . Number of children: 2  . Years of education: Not on file  . Highest education level: Not on file  Occupational History  . Occupation: retired Forensic psychologist: UNEMPLOYED  Tobacco Use  . Smoking status: Never Smoker  . Smokeless tobacco: Never Used  Vaping Use  . Vaping Use: Never used  Substance and Sexual Activity  . Alcohol use: No    Alcohol/week: 0.0 standard drinks  . Drug use: No  . Sexual activity: Not Currently    Partners: Male  Other Topics Concern  . 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 Difficulty of Paying Living Expenses:   Food Insecurity:   . Worried About Charity fundraiser in the Last Year:   . Arboriculturist in the Last Year:   Transportation Needs:   . Film/video editor (Medical):   Marland Kitchen Lack of Transportation (Non-Medical):   Physical Activity:   . Days of Exercise per Week:   . Minutes of Exercise per Session:   Stress:   . Feeling of Stress :   Social Connections:   . Frequency of Communication with Friends and Family:   . Frequency of Social Gatherings with Friends and Family:   . Attends Religious Services:   . Active Member of Clubs or Organizations:   . Attends Archivist Meetings:   Marland Kitchen Marital Status:     ALLERGIES:  Allergies  Allergen Reactions  . Abilify [Aripiprazole] Nausea Only    MEDICATIONS:  Current Outpatient Medications  Medication Sig Dispense Refill  . allopurinol (ZYLOPRIM) 300 MG tablet Take 1  tablet (300 mg total) by mouth daily. 90 tablet 3  . ALOE PO Take 1 capsule by mouth 2 (two) times daily.    Marland Kitchen aspirin 81 MG EC tablet Take 1 tablet (81 mg total) by mouth daily. 90 tablet 3  . carvedilol (COREG) 25 MG tablet Take 1 tablet (25 mg total) by mouth 2 (two) times daily with a meal. 180 tablet 3  . DULoxetine (CYMBALTA) 60 MG capsule Take 1 capsule (60 mg total) by mouth daily. 90 capsule 0  . furosemide (LASIX) 40 MG tablet Take 0.5 tablets (20 mg total) by mouth every other day. 45 tablet 3  . metFORMIN (GLUCOPHAGE) 500 MG tablet TAKE 1 TABLET TWICE DAILY WITH MEALS 180 tablet 1  . methocarbamol (ROBAXIN) 500  MG tablet Take 1 tablet (500 mg total) by mouth every 8 (eight) hours as needed for muscle spasms. 60 tablet 2  . omeprazole (PRILOSEC) 20 MG capsule Take 1 capsule (20 mg total) by mouth daily. 90 capsule 1  . potassium chloride SA (KLOR-CON) 20 MEQ tablet Take 1 tablet (20 mEq total) by mouth daily. 90 tablet 3  . rosuvastatin (CRESTOR) 20 MG tablet Take 1 tablet (20 mg total) by mouth daily. 90 tablet 3  . sacubitril-valsartan (ENTRESTO) 49-51 MG Take 1 tablet by mouth 2 (two) times daily. 180 tablet 3  . spironolactone (ALDACTONE) 25 MG tablet Take 1 tablet (25 mg total) by mouth daily. 90 tablet 3  . traZODone (DESYREL) 100 MG tablet Take 1 tablet (100 mg total) by mouth at bedtime. 90 tablet 0  . Vitamin D, Ergocalciferol, (DRISDOL) 1.25 MG (50000 UNIT) CAPS capsule Take 1 capsule (50,000 Units total) by mouth every 7 (seven) days. 4 capsule 0   No current facility-administered medications for this encounter.    REVIEW OF SYSTEMS: A 10+ POINT REVIEW OF SYSTEMS WAS OBTAINED including neurology, dermatology, psychiatry, cardiac, respiratory, lymph, extremities, GI, GU, musculoskeletal, constitutional, reproductive, HEENT. On the provided form, she reports palpitations, shortness of breath with exertion, lump in left breast, back pain, joint pain, difficulties walking, and  depression. She also reports history of arthritis and using reading glasses. She denies fever, abdominal pain, nausea, vomiting, diarrhea, dysuria, rash, and any other symptoms.    PHYSICAL EXAM:   Vitals with BMI 02/17/2020  Height _0   Weight 293 lbs 13 oz  BMI 56.38  Systolic 756  Diastolic 71  Pulse 57   The patient is in a wheelchair but is able to ambulate a limited basis Lungs are clear to auscultation bilaterally. Heart has regular rate and rhythm. No palpable cervical, supraclavicular, or axillary adenopathy. Abdomen soft, non-tender, normal bowel sounds. Right breast with no palpable mass, nipple discharge, or bleeding.  Left breast with biopsy site at the approximate 3:30 o'clock position. No palpable mass, nipple discharge, or bleeding.   KPS = 80  100 - Normal; no complaints; no evidence of disease. 90   - Able to carry on normal activity; minor signs or symptoms of disease. 80   - Normal activity with effort; some signs or symptoms of disease. 32   - Cares for self; unable to carry on normal activity or to do active work. 60   - Requires occasional assistance, but is able to care for most of his personal needs. 50   - Requires considerable assistance and frequent medical care. 58   - Disabled; requires special care and assistance. 35   - Severely disabled; hospital admission is indicated although death not imminent. 83   - Very sick; hospital admission necessary; active supportive treatment necessary. 10   - Moribund; fatal processes progressing rapidly. 0     - Dead  Karnofsky DA, Abelmann [place], Craver LS and Burchenal [facility] [phone]) The use of the nitrogen mustards in the palliative treatment of carcinoma: with particular reference to bronchogenic carcinoma Cancer 1 634-56  LABORATORY DATA:  Lab Results  Component Value Date   WBC 2.5 (LL) 01/04/2020   HGB 12.5 01/04/2020   HCT 36.9 01/04/2020   MCV 92 01/04/2020   PLT 156 01/04/2020   Lab Results  Component Value  Date   NA 144 01/04/2020   K 5.0 01/04/2020   CL 107 (H) 01/04/2020   CO2 21 01/04/2020   Lab  Results  Component Value Date   ALT 23 12/15/2019   AST 21 12/15/2019   ALKPHOS 72 12/15/2019   BILITOT 0.3 12/15/2019    PULMONARY FUNCTION TEST:   Recent Review Flowsheet Data   There is no flowsheet data to display.     RADIOGRAPHY: US BREAST LTD UNI LEFT INC AXILLA  Result Date: 02/02/2020 CLINICAL DATA:  Patient returns after screening study for evaluation of a possible LEFT breast mass. EXAM: DIGITAL DIAGNOSTIC LEFT MAMMOGRAM WITH CAD AND TOMO ULTRASOUND LEFT BREAST COMPARISON:  01/21/2020, 10/30/2012 ACR Breast Density Category b: There are scattered areas of fibroglandular density. FINDINGS: Additional 2-D and 3-D images are performed. These views confirm presence of an irregular mass in the LATERAL portion of the LEFT breast, associated with microcalcifications. Mammographic images were processed with CAD. On physical exam, I palpate no abnormality in the LATERAL portion of the LEFT breast. Targeted ultrasound is performed, showing an irregular hypoechoic mass with internal calcifications in the 3 o'clock location of the LEFT breast 6 centimeters the nipple which measures 0.9 x 0.8 x 1.3 centimeters. Internal blood flow is present on Doppler evaluation. Evaluation of the LEFT axilla is negative for adenopathy. IMPRESSION: Suspicious mass in the 3 o'clock location of the LEFT breast. RECOMMENDATION: Ultrasound-guided core biopsy LEFT breast mass I have discussed the findings and recommendations with the patient. If applicable, a reminder letter will be sent to the patient regarding the next appointment. BI-RADS CATEGORY  4: Suspicious. Electronically Signed   By: Nolon Nations M.D.   On: 02/02/2020 15:10   MM DIAG BREAST TOMO UNI LEFT  Result Date: 02/02/2020 CLINICAL DATA:  Patient returns after screening study for evaluation of a possible LEFT breast mass. EXAM: DIGITAL DIAGNOSTIC LEFT  MAMMOGRAM WITH CAD AND TOMO ULTRASOUND LEFT BREAST COMPARISON:  01/21/2020, 10/30/2012 ACR Breast Density Category b: There are scattered areas of fibroglandular density. FINDINGS: Additional 2-D and 3-D images are performed. These views confirm presence of an irregular mass in the LATERAL portion of the LEFT breast, associated with microcalcifications. Mammographic images were processed with CAD. On physical exam, I palpate no abnormality in the LATERAL portion of the LEFT breast. Targeted ultrasound is performed, showing an irregular hypoechoic mass with internal calcifications in the 3 o'clock location of the LEFT breast 6 centimeters the nipple which measures 0.9 x 0.8 x 1.3 centimeters. Internal blood flow is present on Doppler evaluation. Evaluation of the LEFT axilla is negative for adenopathy. IMPRESSION: Suspicious mass in the 3 o'clock location of the LEFT breast. RECOMMENDATION: Ultrasound-guided core biopsy LEFT breast mass I have discussed the findings and recommendations with the patient. If applicable, a reminder letter will be sent to the patient regarding the next appointment. BI-RADS CATEGORY  4: Suspicious. Electronically Signed   By: Nolon Nations M.D.   On: 02/02/2020 15:10   MM 3D SCREEN BREAST BILATERAL  Result Date: 01/22/2020 CLINICAL DATA:  Screening. EXAM: DIGITAL SCREENING BILATERAL MAMMOGRAM WITH TOMO AND CAD COMPARISON:  Previous exam(s). ACR Breast Density Category b: There are scattered areas of fibroglandular density. FINDINGS: In the left breast, a possible mass with calcifications warrants further evaluation. In the right breast, no findings suspicious for malignancy. Images were processed with CAD. IMPRESSION: Further evaluation is suggested for possible mass with calcifications in the left breast. RECOMMENDATION: Diagnostic mammogram and possibly ultrasound of the left breast. (Code:[id number]) The patient will be contacted regarding the findings, and additional imaging will  be scheduled. BI-RADS CATEGORY  0: Incomplete. Need additional imaging  evaluation and/or prior mammograms for comparison. Electronically Signed   By: Lovey Newcomer M.D.   On: 01/22/2020 07:59   MM CLIP PLACEMENT LEFT  Result Date: 02/11/2020 CLINICAL DATA:  69 year old female status post ultrasound-guided biopsy of the left breast. EXAM: DIAGNOSTIC LEFT MAMMOGRAM POST ULTRASOUND BIOPSY COMPARISON:  Previous exam(s). FINDINGS: Mammographic images were obtained following ultrasound guided biopsy of left breast. The biopsy marking clip is in expected position at the site of biopsy. IMPRESSION: Appropriate positioning of the ribbon shaped biopsy marking clip at the site of biopsy in the outer left breast in association with a calcified mass. Final Assessment: Post Procedure Mammograms for Marker Placement Electronically Signed   By: Kristopher Oppenheim M.D.   On: 02/11/2020 14:49   [place] LT BREAST BX W LOC DEV 1ST LESION IMG BX SPEC US GUIDE  Addendum Date: 02/12/2020   ADDENDUM REPORT: 02/12/2020 13:19 ADDENDUM: Pathology revealed GRADE II INVASIVE DUCTAL CARCINOMA, DUCTAL CARCINOMA IN SITU, CALCIFICATIONS of the LEFT breast, 3 o'clock, 6 cmfn . This was found to be concordant by Dr. Kristopher Oppenheim. Pathology results were discussed with the patient by telephone. The patient reported doing well after the biopsy with tenderness at the site. Post biopsy instructions and care were reviewed and questions were answered. The patient was encouraged to call [facility] for any additional concerns. The patient was referred to [facility] on February 17, 2020. Pathology results reported by Stacie Acres RN on 02/12/2020. Electronically Signed   By: Kristopher Oppenheim M.D.   On: 02/12/2020 13:19   Result Date: 02/12/2020 CLINICAL DATA:  69 year old female with a suspicious left breast mass. EXAM: ULTRASOUND GUIDED LEFT BREAST CORE NEEDLE  BIOPSY COMPARISON:  Previous exam(s). PROCEDURE: I met with the patient and we discussed the procedure of ultrasound-guided biopsy, including benefits and alternatives. We discussed the high likelihood of a successful procedure. We discussed the risks of the procedure, including infection, bleeding, tissue injury, clip migration, and inadequate sampling. Informed written consent was given. The usual time-out protocol was performed immediately prior to the procedure. Lesion quadrant: Upper outer quadrant Using sterile technique and 1% Lidocaine as local anesthetic, under direct ultrasound visualization, a 12 gauge spring-loaded device was used to perform biopsy of a calcified mass at the 3 o'clock position using a inferior approach. At the conclusion of the procedure ribbon shaped tissue marker clip was deployed into the biopsy cavity. Follow up 2 view mammogram was performed and dictated separately. IMPRESSION: Ultrasound guided biopsy of a left breast mass. No apparent complications. Electronically Signed: By: Kristopher Oppenheim M.D. On: 02/11/2020 14:50      IMPRESSION: Stage T1c, Nx, Mx, Left Breast OUQ, Invasive Ductal Carcinoma with DCIS, ER+ / PR+ / Her2-, Grade 2  Patient will be a good candidate for breast conservation with radiotherapy to the left breast. We discussed the general course of radiation, potential side effects, and toxicities with radiation and the patient is interested in this approach.    PLAN:  1. Left lumpectomy with sentinel lymph node biopsy 2. Oncotype DX 3. Adjuvant radiation therapy 4. Aromatase inhibitor   ------------------------------------------------  Blair Promise, PhD, MD  This document serves as a record of services personally performed by Gery Pray, MD. It was created on his behalf by Clerance Lav, a trained medical scribe. The creation of this record is based on the scribe's personal observations and the provider's statements to them. This document  has been  checked and approved by the attending provider.

## 2020-02-17 NOTE — Progress Notes (Signed)
Chief Complaint:   OBESITY Robin Hardin is here to discuss her progress with her obesity treatment plan along with follow-up of her obesity related diagnoses. Robin Hardin is on the [place] and states she is following her eating plan approximately 80-90% of the time. Robin Hardin states she is exercising for 0 minutes 0 times per week.  Today's visit was #: 3 Starting weight: 301 lbs Starting date: 12/15/2019 Today's weight: 291 lbs Today's date: 02/16/2020 Total lbs lost to date: 10 lbs Total lbs lost since last in-office visit: 5 lbs  Interim History: Robin Hardin says she has been struggling with sweets cravings and emotional eating.  She has been diagnosed recently with a breast mass.  She is the caregiver for her husband.  He is on dialysis with signs of dementia.  She is using a scooter most of the day.  She says she forgets to eat lunch.  Subjective:   1. Chronic combined systolic and diastolic congestive heart failure ([place]) Robin Hardin says that her Robin Hardin Loll was recently decreased.  She says she feels better with the full dose.  She plans to call Cardiology.  2. Anxiety, with emotional eating Robin Hardin has the recent, new diagnosis of breast cancer in the upper outer quadrant of her left breast.  She has an appointment with Oncology tomorrow.  Assessment/Plan:   1. Chronic combined systolic and diastolic congestive heart failure ([place]) Followed by Cardiology for this problem. Those encounter notes were reviewed.  2. Anxiety, with emotional eating Behavior modification techniques were discussed today to help Robin Hardin deal with her anxiety.  Orders and follow up as documented in patient record.   3. Class 3 severe obesity with serious comorbidity and body mass index (BMI) of 50.0 to 59.9 in adult, unspecified obesity type ([place]) Robin Hardin is currently in the action stage of change. As such, her goal is to continue with weight loss efforts. She has agreed to the Category 2 Plan and the [place].   Exercise goals: All adults should avoid inactivity. Some physical activity is better than none, and adults who participate in any amount of physical activity gain some health benefits.  Behavioral modification strategies: increasing lean protein intake, decreasing simple carbohydrates and increasing water intake.  Focus on self-care.  Reviewed caregiver fatigue.  Robin Hardin has agreed to follow-up with our clinic in 2-3 weeks. She was informed of the importance of frequent follow-up visits to maximize her success with intensive lifestyle modifications for her multiple health conditions.   Objective:   Blood pressure 128/75, pulse (!) 58, temperature 99 F (37.2 C), temperature source Oral, height _0  (1.6 m), weight 291 lb (132 kg), SpO2 98 %. Body mass index is 51.55 kg/m.  General: Cooperative, alert, well developed, in no acute distress. HEENT: Conjunctivae and lids unremarkable. Cardiovascular: Regular rhythm.  Lungs: Normal work of breathing. Neurologic: No focal deficits.   Lab Results  Component Value Date   CREATININE 1.76 (H) 01/04/2020   BUN 54 (H) 01/04/2020   NA 144 01/04/2020   K 5.0 01/04/2020   CL 107 (H) 01/04/2020   CO2 21 01/04/2020   Lab Results  Component Value Date   ALT 23 12/15/2019   AST 21 12/15/2019   ALKPHOS 72 12/15/2019   BILITOT 0.3 12/15/2019   Lab Results  Component Value Date   HGBA1C 5.3 12/15/2019   HGBA1C 5.3 07/29/2018   HGBA1C 5.5 11/08/2015   HGBA1C 5.1 03/31/2015   HGBA1C 5.5 12/28/2014   Lab Results  Component Value  Date   INSULIN 6.9 12/15/2019   Lab Results  Component Value Date   TSH 1.500 12/15/2019   Lab Results  Component Value Date   CHOL 196 12/15/2019   HDL 68 12/15/2019   LDLCALC 114 (H) 12/15/2019   LDLDIRECT 160.6 06/14/2009   TRIG 78 12/15/2019   CHOLHDL 2.9 12/15/2019   Lab Results  Component Value Date   WBC 2.5 (LL) 01/04/2020   HGB 12.5 01/04/2020   HCT 36.9 01/04/2020   MCV 92  01/04/2020   PLT 156 01/04/2020   Lab Results  Component Value Date   IRON 84 12/15/2019   TIBC 354 12/15/2019   FERRITIN 48 12/15/2019   Attestation Statements:   Reviewed by clinician on day of visit: allergies, medications, problem list, medical history, surgical history, family history, social history, and previous encounter notes.  Time spent on visit including pre-visit chart review and post-visit care and charting was 25 minutes.   I, Water quality scientist, CMA, am acting as transcriptionist for Briscoe Deutscher, DO  I have reviewed the above documentation for accuracy and completeness, and I agree with the above. Briscoe Deutscher, DO

## 2020-02-17 NOTE — Patient Instructions (Signed)
Physical Therapy Information for After Breast Cancer Surgery/Treatment:   Lymphedema is a swelling condition that you may be at risk for in your arm if you have lymph nodes removed from the armpit area.  After a sentinel node biopsy, the risk is approximately 5-9% and is higher after an axillary node dissection.  There is treatment available for this condition and it is not life-threatening.  Contact your physician or physical therapist with concerns.  You may begin the 4 shoulder/posture exercises (see additional sheet) when permitted by your physician (typically a week after surgery).  If you have drains, you may need to wait until those are removed before beginning range of motion exercises.  A general recommendation is to not lift your arms above shoulder height until drains are removed.  These exercises should be done to your tolerance and gently.  This is not a "no pain/no gain" type of recovery so listen to your body and stretch into the range of motion that you can tolerate, stopping if you have pain.  If you are having immediate reconstruction, ask your plastic surgeon about doing exercises as he or she may want you to wait.  We encourage you to attend the free one time ABC (After Breast Cancer) class offered by [facility].  You will learn information related to lymphedema risk, prevention and treatment and additional exercises to regain mobility following surgery.  You can call [phone] for more information.  This is offered the 1st and 3rd Monday of each month.  You only attend the class one time.  While undergoing any medical procedure or treatment, try to avoid blood pressure being taken or needle sticks from occurring on the arm on the side of cancer.   This recommendation begins after surgery and continues for the rest of your life.  This may help reduce your risk of getting lymphedema (swelling in your arm).  An excellent resource for those seeking information  on lymphedema is the National Lymphedema Network's web site. It can be accessed at [facility].org  If you notice swelling in your hand, arm or breast at any time following surgery (even if it is many years from now), please contact your doctor or physical therapist to discuss this.  Lymphedema can be treated at any time but it is easier for you if it is treated early on.  If you feel like your shoulder motion is not returning to normal in a reasonable amount of time, please contact your surgeon or physical therapist.  [facility] [place], [place], [place] [phone]; [address] [place], [place] [address] ABC CLASS After Breast Cancer Class  After Breast Cancer Class is a specially designed exercise class to assist you in a safe recover after having breast cancer surgery.  In this class you will learn how to get back to full function whether your drains were just removed or if you had surgery a month ago.  This one-time class is held the 1st and 3rd Monday of every month from 11:00 a.m. until 12:00 noon at the [facility] located at [place], [place] [address]  This class is FREE and space is limited. For more information or to register for the next available class, call [phone].  Class Goals   Understand specific stretches to improve the flexibility of you chest and shoulder.  Learn ways to safely strengthen your upper body and improve your posture.  Understand the warning signs of infection and why  you may be at risk for an arm infection.  Learn about Lymphedema and prevention.  ** You do not attend this class until after surgery.  Drains must be removed to participate  Patient was instructed today in a home exercise program today for post op shoulder range of motion. These included active assist shoulder flexion in sitting, scapular retraction, wall walking with shoulder abduction, and hands behind head external rotation.  She was  encouraged to do these twice a day, holding 3 seconds and repeating 5 times when permitted by her physician.

## 2020-02-18 ENCOUNTER — Telehealth: Payer: Self-pay | Admitting: Hematology and Oncology

## 2020-02-18 NOTE — Telephone Encounter (Signed)
No 6/30 los, no changes made to patient schedule

## 2020-02-25 ENCOUNTER — Encounter: Payer: Self-pay | Admitting: *Deleted

## 2020-02-25 ENCOUNTER — Other Ambulatory Visit: Payer: Self-pay | Admitting: Surgery

## 2020-02-25 ENCOUNTER — Telehealth: Payer: Self-pay | Admitting: *Deleted

## 2020-02-25 ENCOUNTER — Telehealth: Payer: Self-pay | Admitting: Hematology and Oncology

## 2020-02-25 DIAGNOSIS — Z853 Personal history of malignant neoplasm of breast: Secondary | ICD-10-CM

## 2020-02-25 NOTE — Telephone Encounter (Signed)
Scheduled per 7/8 sch message. Unable to reach pt. Voicemail is full. Sending pt appt calendar.

## 2020-02-25 NOTE — Telephone Encounter (Signed)
Spoke with patient to follow up from [facility] and assess navigation needs.  No questions or concerns at this time.

## 2020-02-28 ENCOUNTER — Encounter: Payer: Self-pay | Admitting: Dietician

## 2020-02-28 NOTE — Progress Notes (Signed)
Nutrition  Patient identified after attending Breast Clinic on 02/17/2020. Patient given nutrition packet with RD contact information by nurse navigator at that time.  Chart reviewed.   Patient with malignant neoplasm of upper-outer quadrant of left breast, estrogen receptor positive. Plans for breast conserving surgery with sentinel lymph node biopsy, possible Oncotype DX testing pending pathology report, adjuvant radiation followed by adjuvant antiestrogen therapy.   Patient is not currently at nutrition risk. Please consult RD if nutrition issues arise.  Lajuan Lines, RD, LDN Clinical Nutrition After Hours/Weekend Pager # in [place]

## 2020-02-29 ENCOUNTER — Encounter: Payer: Self-pay | Admitting: *Deleted

## 2020-03-01 ENCOUNTER — Other Ambulatory Visit: Payer: Self-pay

## 2020-03-01 ENCOUNTER — Ambulatory Visit (INDEPENDENT_AMBULATORY_CARE_PROVIDER_SITE_OTHER): Payer: Medicare HMO | Admitting: Family Medicine

## 2020-03-01 ENCOUNTER — Encounter (INDEPENDENT_AMBULATORY_CARE_PROVIDER_SITE_OTHER): Payer: Self-pay | Admitting: Family Medicine

## 2020-03-01 VITALS — BP 126/71 | HR 55 | Temp 98.0°F | Ht 63.0 in | Wt 293.0 lb

## 2020-03-01 DIAGNOSIS — F3289 Other specified depressive episodes: Secondary | ICD-10-CM

## 2020-03-01 DIAGNOSIS — I509 Heart failure, unspecified: Secondary | ICD-10-CM

## 2020-03-01 DIAGNOSIS — Z6841 Body Mass Index (BMI) 40.0 and over, adult: Secondary | ICD-10-CM | POA: Diagnosis not present

## 2020-03-01 DIAGNOSIS — R6889 Other general symptoms and signs: Secondary | ICD-10-CM | POA: Diagnosis not present

## 2020-03-01 NOTE — Progress Notes (Signed)
Chief Complaint:   OBESITY Robin Hardin is here to discuss her progress with her obesity treatment plan along with follow-up of her obesity related diagnoses. Robin Hardin is on the [place] and states she is following her eating plan approximately 80% of the time. Robin Hardin states she is exercising for 0 minutes 0 times per week.  Today's visit was #: 4 Starting weight: 301 lbs Starting date: 12/15/2019 Today's weight: 293 lbs Today's date: 03/01/2020 Total lbs lost to date: 8 lbs Total lbs lost since last in-office visit: 0  Interim History: Robin Hardin says her mood is low.  She has received a new diagnosis of breast cancer.  She feels that she is retaining more fluid.  Subjective:   1. Other congestive heart failure (HCC) Recent [place] change, but feeling worse. She has a breast biopsy procedure on 7/22. Recommend Robin Hardin get an appointment with Cardiology, Dr. Aundra Dubin, prior to 7/22, if possible. We will contact his staff.  2. Other depression Robin Hardin is struggling with emotional eating and using food for comfort to the extent that it is negatively impacting her health. She has been working on behavior modification techniques to help reduce her emotional eating and has been minimally successful. She shows no sign of suicidal or homicidal ideations.  Assessment/Plan:   1. Other congestive heart failure ([place]) Will continue to monitor.  2. Other depression Continue working with Psychiatry.  Discussed support and resources.  3. Class 3 severe obesity with serious comorbidity and body mass index (BMI) of 50.0 to 59.9 in adult, unspecified obesity type (Robin Hardin) Robin Hardin is currently in the action stage of change. As such, her goal is to continue with weight loss efforts. She has agreed to the [place].   Exercise goals: All adults should avoid inactivity. Some physical activity is better than none, and adults who participate in any amount of physical activity gain some health  benefits.  Behavioral modification strategies: increasing lean protein intake, increasing water intake and increasing high fiber foods.  Robin Hardin has agreed to follow-up with our clinic in 2-3 weeks. She was informed of the importance of frequent follow-up visits to maximize her success with intensive lifestyle modifications for her multiple health conditions.   Objective:   Blood pressure 126/71, pulse (!) 55, temperature 98 F (36.7 C), temperature source Oral, height _0  (1.6 m), weight 293 lb (132.9 kg), SpO2 99 %. Body mass index is 51.9 kg/m.  General: Cooperative, alert, well developed, in no acute distress. HEENT: Conjunctivae and lids unremarkable. Cardiovascular: Regular rhythm.  Lungs: Normal work of breathing. Neurologic: No focal deficits.   Lab Results  Component Value Date   CREATININE 1.76 (H) 01/04/2020   BUN 54 (H) 01/04/2020   NA 144 01/04/2020   K 5.0 01/04/2020   CL 107 (H) 01/04/2020   CO2 21 01/04/2020   Lab Results  Component Value Date   ALT 23 12/15/2019   AST 21 12/15/2019   ALKPHOS 72 12/15/2019   BILITOT 0.3 12/15/2019   Lab Results  Component Value Date   HGBA1C 5.3 12/15/2019   HGBA1C 5.3 07/29/2018   HGBA1C 5.5 11/08/2015   HGBA1C 5.1 03/31/2015   HGBA1C 5.5 12/28/2014   Lab Results  Component Value Date   INSULIN 6.9 12/15/2019   Lab Results  Component Value Date   TSH 1.500 12/15/2019   Lab Results  Component Value Date   CHOL 196 12/15/2019   HDL 68 12/15/2019   LDLCALC 114 (H) 12/15/2019   LDLDIRECT 160.6  06/14/2009   TRIG 78 12/15/2019   CHOLHDL 2.9 12/15/2019   Lab Results  Component Value Date   WBC 2.5 (LL) 01/04/2020   HGB 12.5 01/04/2020   HCT 36.9 01/04/2020   MCV 92 01/04/2020   PLT 156 01/04/2020   Lab Results  Component Value Date   IRON 84 12/15/2019   TIBC 354 12/15/2019   FERRITIN 48 12/15/2019   Attestation Statements:   Reviewed by clinician on day of visit: allergies, medications, problem  list, medical history, surgical history, family history, social history, and previous encounter notes.  Time spent on visit including pre-visit chart review and post-visit care and charting was 25 minutes.   I, Water quality scientist, CMA, am acting as transcriptionist for Briscoe Deutscher, DO  I have reviewed the above documentation for accuracy and completeness, and I agree with the above. Briscoe Deutscher, DO

## 2020-03-03 ENCOUNTER — Encounter (HOSPITAL_COMMUNITY): Payer: Self-pay

## 2020-03-03 ENCOUNTER — Other Ambulatory Visit: Payer: Self-pay

## 2020-03-03 ENCOUNTER — Encounter (HOSPITAL_COMMUNITY)
Admission: RE | Admit: 2020-03-03 | Discharge: 2020-03-03 | Disposition: A | Discharge status: Home / Self Care | Payer: Medicare HMO | Source: Ambulatory Visit | Attending: Surgery | Admitting: Surgery

## 2020-03-03 DIAGNOSIS — C50912 Malignant neoplasm of unspecified site of left female breast: Secondary | ICD-10-CM | POA: Insufficient documentation

## 2020-03-03 DIAGNOSIS — Z79899 Other long term (current) drug therapy: Secondary | ICD-10-CM | POA: Insufficient documentation

## 2020-03-03 DIAGNOSIS — N182 Chronic kidney disease, stage 2 (mild): Secondary | ICD-10-CM | POA: Insufficient documentation

## 2020-03-03 DIAGNOSIS — I130 Hypertensive heart and chronic kidney disease with heart failure and stage 1 through stage 4 chronic kidney disease, or unspecified chronic kidney disease: Secondary | ICD-10-CM | POA: Diagnosis not present

## 2020-03-03 DIAGNOSIS — Z01812 Encounter for preprocedural laboratory examination: Secondary | ICD-10-CM | POA: Diagnosis not present

## 2020-03-03 DIAGNOSIS — I428 Other cardiomyopathies: Secondary | ICD-10-CM | POA: Diagnosis not present

## 2020-03-03 DIAGNOSIS — Z7982 Long term (current) use of aspirin: Secondary | ICD-10-CM | POA: Insufficient documentation

## 2020-03-03 DIAGNOSIS — I5042 Chronic combined systolic (congestive) and diastolic (congestive) heart failure: Secondary | ICD-10-CM | POA: Insufficient documentation

## 2020-03-03 DIAGNOSIS — I252 Old myocardial infarction: Secondary | ICD-10-CM | POA: Insufficient documentation

## 2020-03-03 HISTORY — DX: Acute myocardial infarction, unspecified: I21.9

## 2020-03-03 LAB — BASIC METABOLIC PANEL
Anion gap: 10 (ref 5–15)
BUN: 35 mg/dL — ABNORMAL HIGH (ref 8–23)
CO2: 26 mmol/L (ref 22–32)
Calcium: 9.6 mg/dL (ref 8.9–10.3)
Chloride: 106 mmol/L (ref 98–111)
Creatinine, Ser: 1.58 mg/dL — ABNORMAL HIGH (ref 0.44–1.00)
GFR calc Af Amer: 39 mL/min — ABNORMAL LOW (ref >60)
GFR calc non Af Amer: 33 mL/min — ABNORMAL LOW (ref >60)
Glucose, Bld: 110 mg/dL — ABNORMAL HIGH (ref 70–99)
Potassium: 4.1 mmol/L (ref 3.5–5.1)
Sodium: 142 mmol/L (ref 135–145)

## 2020-03-03 LAB — CBC
HCT: 35.6 % — ABNORMAL LOW (ref 36.0–46.0)
Hemoglobin: 10.8 g/dL — ABNORMAL LOW (ref 12.0–15.0)
MCH: 30.7 pg (ref 26.0–34.0)
MCHC: 30.3 g/dL (ref 30.0–36.0)
MCV: 101.1 fL — ABNORMAL HIGH (ref 80.0–100.0)
Platelets: 141 10*3/uL — ABNORMAL LOW (ref 150–400)
RBC: 3.52 MIL/uL — ABNORMAL LOW (ref 3.87–5.11)
RDW: 13.9 % (ref 11.5–15.5)
WBC: 3.7 10*3/uL — ABNORMAL LOW (ref 4.0–10.5)
nRBC: 0 % (ref 0.0–0.2)

## 2020-03-03 NOTE — Progress Notes (Signed)
PCP - Gilberto Better Cardiologist - Dr. Aundra Dubin  Chest x-ray - n/a EKG - 12-15-19 ECHO - 09-17-19  SA - yes, wears CPAP   Patient states that she is not diabetic, but is currently on Metformin for weight loss.   ERAS Protcol - yes, Ensure given   COVID TEST- 03-07-20   Anesthesia review: yes, heart history  Patient denies shortness of breath, fever, cough and chest pain at PAT appointment   All instructions explained to the patient, with a verbal understanding of the material. Patient agrees to go over the instructions while at home for a better understanding. Patient also instructed to self quarantine after being tested for COVID-19. The opportunity to ask questions was provided.

## 2020-03-03 NOTE — Progress Notes (Addendum)
[place], [place] [address] Phone: [phone] Fax: [phone]  [facility] ([place]), [place] - [address] [place] ([place]) [place] [address] Phone: [phone] Fax: [phone]      Your procedure is scheduled on Thursday, July 22nd.  Report to [facility] Entrance "A" at 1:45 P.M., and check in at the Admitting office.  Call this number if you have problems the morning of surgery:  [phone]  Call [phone] if you have any questions prior to your surgery date Monday-Friday 8am-4pm    Remember:  Do not eat after midnight the night before your surgery  You may drink clear liquids until 12:45 PM the day of your surgery.   Clear liquids allowed are: Water, Non-Citrus Juices (without pulp), Carbonated Beverages, Clear Tea, Black Coffee Only, and Gatorade  Finish your pre-surgery Ensure drink by 12:45 PM.  Do not sip.  Drink all in one sitting.  Nothing else to drink after you finish the Ensure.     Take these medicines the morning of surgery with A SIP OF WATER   Allopurinol (Zyloprim)  Carvedilol (Coreg)  Duloxetine (Cymbalta)  Omeprazole (Prilosec)  Rosuvastatin (Crestor)    As of today, STOP taking any Aspirin (unless otherwise instructed by your surgeon) Aleve, Naproxen, Ibuprofen, Motrin, Advil, Goody's, BC's, all herbal medications, fish oil, and all vitamins.  On the day of surgery: Do NOT take Metformin                       Do not wear jewelry, make up, or nail polish            Do not wear lotions, powders, perfumes, or deodorant.            Do not shave 48 hours prior to surgery.              Do not bring valuables to the hospital.            [facility] is not responsible for any belongings or valuables.  Do NOT Smoke (Tobacco/Vaping) or drink Alcohol 24 hours prior to your procedure If you use a CPAP at night, you may  bring all equipment for your overnight stay.   Contacts, glasses, dentures or bridgework may not be worn into surgery.      For patients admitted to the hospital, discharge time will be determined by your treatment team.   Patients discharged the day of surgery will not be allowed to drive home, and someone needs to stay with them for 24 hours.    Special instructions:   [facility]- Preparing For Surgery  Before surgery, you can play an important role. Because skin is not sterile, your skin needs to be as free of germs as possible. You can reduce the number of germs on your skin by washing with CHG (chlorahexidine gluconate) Soap before surgery.  CHG is an antiseptic cleaner which kills germs and bonds with the skin to continue killing germs even after washing.    Oral Hygiene is also important to reduce your risk of infection.  Remember - BRUSH YOUR TEETH THE MORNING OF SURGERY WITH YOUR REGULAR TOOTHPASTE  Please do not use if you have an allergy to CHG or antibacterial soaps. If your skin becomes reddened/irritated stop using the CHG.  Do not shave (including legs and underarms) for at least 48 hours  prior to first CHG shower. It is OK to shave your face.  Please follow these instructions carefully.   1. Shower the NIGHT BEFORE SURGERY and the MORNING OF SURGERY with CHG Soap.   2. If you chose to wash your hair, wash your hair first as usual with your normal shampoo.  3. After you shampoo, rinse your hair and body thoroughly to remove the shampoo.  4. Use CHG as you would any other liquid soap. You can apply CHG directly to the skin and wash gently with a scrungie or a clean washcloth.   5. Apply the CHG Soap to your body ONLY FROM THE NECK DOWN.  Do not use on open wounds or open sores. Avoid contact with your eyes, ears, mouth and genitals (private parts). Wash Face and genitals (private parts)  with your normal soap.   6. Wash thoroughly, paying special attention to the area  where your surgery will be performed.  7. Thoroughly rinse your body with warm water from the neck down.  8. DO NOT shower/wash with your normal soap after using and rinsing off the CHG Soap.  9. Pat yourself dry with a CLEAN TOWEL.  10. Wear CLEAN PAJAMAS to bed the night before surgery  11. Place CLEAN SHEETS on your bed the night of your first shower and DO NOT SLEEP WITH PETS.   Day of Surgery: Wear Clean/Comfortable clothing the morning of surgery Do not apply any deodorants/lotions.   Remember to brush your teeth WITH YOUR REGULAR TOOTHPASTE.   Please read over the following fact sheets that you were given.

## 2020-03-04 ENCOUNTER — Encounter (HOSPITAL_COMMUNITY): Payer: Self-pay

## 2020-03-04 NOTE — Progress Notes (Addendum)
Anesthesia Chart Review:  Case: [id number] Date/Time: 03/10/20 1530   Procedure: LEFT BREAST LUMPECTOMY WITH RADIOACTIVE SEED AND SENTINEL LYMPH NODE BIOPSY (Left Breast)   Anesthesia type: General   Pre-op diagnosis: LEFT BREAST CANCER   Location: [facility] OR ROOM 06 / [facility]   Surgeons: Coralie Keens, MD      DISCUSSION: Patient is a 69 year old female scheduled for the above procedure.  History includes never smoker, left breast cancer (01/2020), non-ischemic cardiomyopathy (diagnosed ~ 2003 in [place], moved to [place] ~ 2010 and followed by Dr. Aundra Dubin), MI (reported history of; normal coronaries at time of NICM diagnosis 2003), chronic combined systolic and diastolic CHF, LE edema, HTN, CKD, OSA (intolerant to   CPAP), dyspnea, anxiety, depression, morbid obesity.  Last cardiology visit 09/17/19 with Dr. Aundra Dubin. Echo showed LVEF increased to 50-55%. He notes she is very inactive (uses wheelchair/scooter most of the day), but primarily due to orthopedic issues and deconditioning rather than CHF. Her weight was up but thought more from caloric intake and not CHF as did not appear volume overloaded. [facility]. Six month follow-up planned.   Presurgical COVID-19 test is scheduled for 03/07/2020.  RSL scheduled for 03/09/2020.  I called patient to see how she was doing. She reported that she has had worsening edema since Dr. Truman Hayward decreased her [name] to 1/2 tablet BID on 01/08/20, but worse in the past week. (Notes indicate he did this due to new leukopenia since Entresto had been increased, and asked her to follow-up with cardiology.) She does not feel more short of breath, but says legs are more edematous and feels her abdomen is also swollen. She denied chest pain or palpitations. She sleeps on an adjustable bed with head elevated at times. She wants someone from the [facility] Clinic to follow-up with her because she thinks she may need to be seen. When asked if she had tried  to contact Dr. Claris Gladden office with her symptoms, it sounds like she thought another provider was going to ask his office to schedule follow-up. No conversational dyspnea noted during our phone call. Vitals stable at PAT visit on 03/03/20. I told patient I would leave a message for a HF RN requesting someone to contact her, but also encouraged patient to contact Dr. Claris Gladden office and to contact on-call provider or go to ED over the weekend if symptoms worsen. I did leave a message for the HF RN regarding above and also noted plans for seed implant and breast cancer surgery. I also updated [facility] triage RN who will route to [facility] at their office.  Chart will be left for follow-up.   ADDENDUM 03/09/20 9:14 AM: 03/07/20 COVID-19 test negative. She was seen at the [facility] Clinic by Dr. Aundra Dubin on 03/09/20. She had increased her Entresto back up to 1 tablet BID on her own, since she had worsening edema on 1/2 dosing. He did not think she was significantly volume overloaded. Depression thought to be playing a role in her symptomology. He instructed her to continue Lasix, spironolactone, Entresto, Coreg. He thought she was "stable for lumpectomy this week" and stressed for her to continue Coreg perioperatively (which is listed on her day of surgery medications). I updated triage nurse at [facility]. She is for seed implant today. Anesthesia team to evaluate on the day of surgery.   VS: BP 114/74    Pulse 70    Temp 37 C (Oral)    Resp 20  Ht _0  (1.6 m)    Wt 134.4 kg    SpO2 97%    BMI 52.47 kg/m    PROVIDERS: Mosetta Anis, MD his PCP Loralie Champagne, MD is HF cardiologist. Last visit 09/17/19.  Gery Pray, MD is RAD-ONC Nicholas Lose, MD is HEM-ONC Briscoe Deutscher, DO sees her at [facility] Berniece Andreas, MD is psychiatrist   LABS: Labs reviewed: Acceptable for surgery. Cr 1.58, appears consistent with previous results. She denied seeing a nephrologist. LFTs WNL 12/15/19. A1c 5.3%  on 12/15/19.  (all labs ordered are listed, but only abnormal results are displayed)  Labs Reviewed  BASIC METABOLIC PANEL - Abnormal; Notable for the following components:      Result Value   Glucose, Bld 110 (*)    BUN 35 (*)    Creatinine, Ser 1.58 (*)    GFR calc non Af Amer 33 (*)    GFR calc Af Amer 39 (*)    All other components within normal limits  CBC - Abnormal; Notable for the following components:   WBC 3.7 (*)    RBC 3.52 (*)    Hemoglobin 10.8 (*)    HCT 35.6 (*)    MCV 101.1 (*)    Platelets 141 (*)    All other components within normal limits     EKG: 12/15/19: Sinus  Bradycardia at 55 bpm -  Nonspecific T-abnormality.  - She has had at least intermittent T wave inversion in V4-6 dating back to 2016. T wave inversion in V6 is more pronounced when compared to prior tracings.     CV: Echo 09/17/19: IMPRESSIONS  1. Left ventricular ejection fraction, by visual estimation, is 50 to  55%. The left ventricle has mildly decreased function. There is mildly  increased left ventricular hypertrophy.  2. Left ventricular diastolic parameters are consistent with Grade II  diastolic dysfunction (pseudonormalization).  3. The left ventricle demonstrates global hypokinesis.  4. Global right ventricle has normal systolic function.The right  ventricular size is normal. No increase in right ventricular wall  thickness.  5. Left atrial size was normal.  6. Right atrial size was normal.  7. Mild mitral annular calcification.  8. The mitral valve is normal in structure. No evidence of mitral valve  regurgitation. No evidence of mitral stenosis.  9. The tricuspid valve is normal in structure. Tricuspid valve  regurgitation is not demonstrated.  10. The aortic valve is tricuspid. Aortic valve regurgitation is not  visualized. Mild aortic valve sclerosis without stenosis.  11. TR signal is inadequate for assessing pulmonary artery systolic  pressure.  12. The  inferior vena cava is normal in size with greater than 50%  respiratory variability, suggesting right atrial pressure of 3 mmHg.  (Comparison: LVEF 40-45%, diffuse hypokinesis 07/29/13; LVEF 35-40%, diffuse hypokinesis 05/02/15; 40-45%, septal apical and inferior basal hypokinesis 05/12/18)   Nuclear stress test 01/24/16: Impression: Myocardial perfusion is normal.   This is a low risk study.  Overall left ventricular systolic function was normal.    LV cavity size is mildly enlarged.  Nuclear stress EF:  52%.  The left ventricular ejection fraction is mildly decreased (45-54%).    RHC/LHC 07/27/02 ([facility], scanned under Media tab, Correspondence, 07/27/02): Summary Normal coronaries. Moderately reduced LV systolic function, EF approximately 35%. Mild pulmonary hypertension. Recommendations: Medical treatment for nonischemic cardiomyopathy.   Past Medical History:  Diagnosis Date   Acute respiratory failure with hypoxia ([place]) 09/26/2012  Anxiety    Arthritis    Back pain    Bilateral knee pain    Cellulitis of lower leg    left   Chest pain    Chronic combined systolic and diastolic congestive heart failure ([place]) 10/10/2012   2 D echo 09/2012:  Left ventricle: Extremely poor acoustic windows limit study. Overall LVEF appears to be severely depressed. Recomm ordering limited study with contrast to further evaluate LV systolic function. The cavity size was normal. Wall thickness was normal. Doppler parameters are consistent with abnormal left ventricular relaxation (grade 1 diastolic dysfunction).   2 D echo 03/2009 :  Left ventricle: The cavity size was normal. There was mild concentric hypertrophy. Systolic function was mildly reduced. The estimated ejection fraction was in the range of 45% to 50%. Diffuse hypokinesis. Doppler parameters are consistent with abnormal left ventricular relaxation (grade 1 diastolic dysfunction).      Chronic kidney disease 10/10/2012    Stage 2 with GFR of 64    Constipation    Depression    Edema, lower extremity    Gout    H/O: hysterectomy    History of heart attack    Hyperlipidemia    Hypertension    Joint pain    Malignant hypertension 09/26/2012   Morbid obesity ([place])    Myocardial infarction ([place])    Nonischemic cardiomyopathy ([place])    Shortness of breath    Sleep apnea    on cpap   Unspecified essential hypertension 03/22/2009   Qualifier: Diagnosis of  By: Karrie Meres, RN, BSN, [name]     Vitamin D deficiency     Past Surgical History:  Procedure Laterality Date   fibroid tumor removal  2004    MEDICATIONS:  allopurinol (ZYLOPRIM) 300 MG tablet   ALOE PO   aspirin 81 MG EC tablet   carvedilol (COREG) 25 MG tablet   DULoxetine (CYMBALTA) 60 MG capsule   furosemide (LASIX) 40 MG tablet   metFORMIN (GLUCOPHAGE) 500 MG tablet   methocarbamol (ROBAXIN) 500 MG tablet   omeprazole (PRILOSEC) 20 MG capsule   potassium chloride SA (KLOR-CON) 20 MEQ tablet   rosuvastatin (CRESTOR) 20 MG tablet   sacubitril-valsartan (ENTRESTO) 49-51 MG   spironolactone (ALDACTONE) 25 MG tablet   traZODone (DESYREL) 100 MG tablet   Vitamin D, Ergocalciferol, (DRISDOL) 1.25 MG (50000 UNIT) CAPS capsule   No current facility-administered medications for this encounter.    Myra Gianotti, PA-C Surgical Short Stay/Anesthesiology [facility] Phone [phone] [facility] Phone [phone] 03/04/2020 4:02 PM

## 2020-03-07 ENCOUNTER — Telehealth (HOSPITAL_COMMUNITY): Payer: Self-pay | Admitting: *Deleted

## 2020-03-07 ENCOUNTER — Other Ambulatory Visit (HOSPITAL_COMMUNITY)
Admission: RE | Admit: 2020-03-07 | Discharge: 2020-03-07 | Disposition: A | Discharge status: Home / Self Care | Payer: Medicare HMO | Source: Ambulatory Visit | Attending: Surgery | Admitting: Surgery

## 2020-03-07 DIAGNOSIS — Z20822 Contact with and (suspected) exposure to covid-19: Secondary | ICD-10-CM | POA: Insufficient documentation

## 2020-03-07 DIAGNOSIS — Z01812 Encounter for preprocedural laboratory examination: Secondary | ICD-10-CM | POA: Insufficient documentation

## 2020-03-07 LAB — SARS CORONAVIRUS 2 (TAT 6-24 HRS): SARS Coronavirus 2: NEGATIVE

## 2020-03-07 NOTE — Telephone Encounter (Signed)
I spoke with patient she said she will only see Dr.McLean she does not want to see anyone else. Pt c/o swelling in legs since  her pcp decreased her entresto to 24/7m bid due to low white blood cell count. Pt would like a call from [place] she can not be worked in to see him before her procedure.  ===View-only below this line=== ----- Message ----- From: MLarey Dresser MD Sent: 03/01/2020   2:00 PM EDT To: KMilas Kocher RN, * Subject: FW: Appointment                                Can get appointment with PA/NP if possible.   ----- Message ----- From: FKarren Cobble CHarrisonSent: 03/01/2020  12:27 PM EDT To: DLarey Dresser MD, Philicia RLogan Bores CMA Subject: Appointment                                    Good Afternoon!  Dr WJuleen Chinawanted me to reach out to see if Dr MAundra Dubincould see this patient before her procedure on 03/10/20. If this can be done, please call the patient to schedule. Thank you in advance.  Hope you have a great day! BRaynald Kemp CMA

## 2020-03-08 ENCOUNTER — Ambulatory Visit (HOSPITAL_COMMUNITY)
Admission: RE | Admit: 2020-03-08 | Discharge: 2020-03-08 | Disposition: A | Discharge status: Home / Self Care | Payer: Medicare HMO | Source: Ambulatory Visit | Attending: Cardiology | Admitting: Cardiology

## 2020-03-08 ENCOUNTER — Other Ambulatory Visit: Payer: Self-pay

## 2020-03-08 ENCOUNTER — Encounter (HOSPITAL_COMMUNITY): Payer: Self-pay | Admitting: Cardiology

## 2020-03-08 DIAGNOSIS — N183 Chronic kidney disease, stage 3 unspecified: Secondary | ICD-10-CM | POA: Insufficient documentation

## 2020-03-08 DIAGNOSIS — R9431 Abnormal electrocardiogram [ECG] [EKG]: Secondary | ICD-10-CM | POA: Insufficient documentation

## 2020-03-08 DIAGNOSIS — F329 Major depressive disorder, single episode, unspecified: Secondary | ICD-10-CM | POA: Insufficient documentation

## 2020-03-08 DIAGNOSIS — Z170 Estrogen receptor positive status [ER+]: Secondary | ICD-10-CM | POA: Insufficient documentation

## 2020-03-08 DIAGNOSIS — Z7982 Long term (current) use of aspirin: Secondary | ICD-10-CM | POA: Insufficient documentation

## 2020-03-08 DIAGNOSIS — G4733 Obstructive sleep apnea (adult) (pediatric): Secondary | ICD-10-CM | POA: Insufficient documentation

## 2020-03-08 DIAGNOSIS — C50912 Malignant neoplasm of unspecified site of left female breast: Secondary | ICD-10-CM | POA: Diagnosis not present

## 2020-03-08 DIAGNOSIS — I428 Other cardiomyopathies: Secondary | ICD-10-CM | POA: Insufficient documentation

## 2020-03-08 DIAGNOSIS — Z79899 Other long term (current) drug therapy: Secondary | ICD-10-CM | POA: Diagnosis not present

## 2020-03-08 DIAGNOSIS — I130 Hypertensive heart and chronic kidney disease with heart failure and stage 1 through stage 4 chronic kidney disease, or unspecified chronic kidney disease: Secondary | ICD-10-CM | POA: Insufficient documentation

## 2020-03-08 DIAGNOSIS — Z7984 Long term (current) use of oral hypoglycemic drugs: Secondary | ICD-10-CM | POA: Insufficient documentation

## 2020-03-08 DIAGNOSIS — E785 Hyperlipidemia, unspecified: Secondary | ICD-10-CM | POA: Diagnosis not present

## 2020-03-08 DIAGNOSIS — E1122 Type 2 diabetes mellitus with diabetic chronic kidney disease: Secondary | ICD-10-CM | POA: Insufficient documentation

## 2020-03-08 DIAGNOSIS — I5042 Chronic combined systolic (congestive) and diastolic (congestive) heart failure: Secondary | ICD-10-CM | POA: Diagnosis not present

## 2020-03-08 DIAGNOSIS — Z86718 Personal history of other venous thrombosis and embolism: Secondary | ICD-10-CM | POA: Insufficient documentation

## 2020-03-08 DIAGNOSIS — M109 Gout, unspecified: Secondary | ICD-10-CM | POA: Diagnosis not present

## 2020-03-08 MED ORDER — FUROSEMIDE 40 MG PO TABS: 20.0000 mg | ORAL_TABLET | Freq: Every day | ORAL | 3 refills | Status: DC

## 2020-03-08 NOTE — Progress Notes (Signed)
[name], female   DOB: Apr 03, 1951, 69 y.o.   MRN: [id number]    Advanced Heart Failure Clinic Note   Primary Care: Mosetta Anis, MD Primary Cardiologist: Dr. Aundra Dubin  HPI:  Robin Hardin is a 69 y.o. AAF with history of morbid obesity, HTN, and diastolic HF diagnosed in 1157.  Robin Hardin was eventually diagnosed with systolic heart failure by v-gram (09/2012) with an EF approximately 25%.  LHC showed no significant coronary disease.  Robin Hardin also has OSA with CPAP.   Robin Hardin had stopped taking her fluid pills in Feb 2014 due to cramping and ended up being admitted with respiratory failure.  Robin Hardin was diuresed ~40 pounds.  Her torsemide was changed to lasix to avoid cramping.  Echo showed LVEF 25%. Discharge weight 387 lbs. Echo in 12/14 showed EF 40-45% with mild LV dilation.  Echo in 9/16 showed EF 35-40% with grade II diastolic dysfunction.  Robin Hardin had atypical chest pain in 6/17, Cardiolite at that time showed EF up to 52% with no ischemia/infarction. Echo in 9/19 showed EF 40-45%.     Echo in 1/21 showed EF up to 50-55% with mild diffuse hypokinesis, mild LVH.   Robin Hardin returns for followup of CHF.  Robin Hardin remains inactive.  Robin Hardin can walk short distances without problems but uses a motorized scooter whenever Robin Hardin leaves the house and has a chair lift to go up her stairs.  Weight is down 10 lbs.  No orthopnea/PND.  No lightheadedness.  Her PCP decreased Entresto thinking it may have caused leukopenia, but Robin Hardin developed worsening peripheral edema and restarted the higher dose on her own.  No chest pain.   Robin Hardin was diagnosed recently with left breast cancer and is planned for lumpectomy later this week.   ECG (personally reviewed): NSR, nonspecific T wave flattening.   Labs (7/14): K 3.9 Creatinine 0.91, LDL 105 Labs (06/05/13): K 3.7 Creatinine 0.87 Pro BNP 56  Labs (1/15): K 4, creatinine 0.9 Labs (07/27/14): K 4.2 Creatinine 0.92 Hgb 11.8 HIV NR Labs (5/16): K 5, creatinine 2.57, TSH normal, HCT  36.5 Labs (7/16): K 4, creatinine 1.08, LDL 132 Labs (9/16): K 3.6, creatinine 1.17 Labs (10/16): creatinine 2.19 => 1.34 Labs (11/16): K 4, creatinine 1.0, BNP 114 Labs (5/17): LDL 221, creatinine 2.07 Labs (6/17): K 4, creatinine 1.15 Labs (10/17): LDL 122, HDL 58 Labs (2/18): K 3.7, creatinine 1.28 Labs (3/18): K 4.4, creatinine 1.17 Labs (8/18): TSH normal Labs (9/19): LDL 105 Labs (12/19): K 3.7, creatinine 1.15 Labs (7/20): K 4.2, creatinine 1.55 Labs (9/20): K 4.5, creatinine 2.26 => 1.4 Labs (11/20): K 4.7, creatinine 1.55  Labs (7/21): K 4.1, creatinine 1.5, hgb 10.8, WBCs 3.7  Past Medical History:  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 Robin Hardin was hospitalized at [facility] in [place] where Robin Hardin had a cath showing normal coronaries but EF 30-35% on LV-gram. Robin Hardin has been hospitalized two other times since then in [place] for CHF. Echo (8/10) showed EF 45-50%, mild global HK, mild LVH, grade I diastolic dysfunction, no significant valvular problems.  Echo (2/14) with EF severely decreased (poor windows so not quantified, appears about 25%).  Echo (12/14) with EF 40-45%, mild LV dilation, moderate LVH, moderate LAE.   - Echo (9/16) with EF 35-40%, grade II diastolic dysfunction, mild MR.  - Lexiscan Cardiolite (6/17) with EF 52%, no ischemia/infarction.  - Echo (9/19): EF 40-45%.  - Echo (1/21): EF up to 50-55% with mild diffuse  hypokinesis, mild LVH 2. HTN  3. Depression  4. Morbid obesity  5. Hyperlipidemia  6. OSA: sporadic with CPAP  7. Gout on allopurinol  8. Bilateral knee pain, probable osteoarthritis  9. Diabetes type II  10. Left lower leg cellulitis in 10/11 with normal [place] for DVT 11. CKD: Stage 3.  12. Chronic leukopenia 13. Breast cancer: ER+/PR+/HER2-  Family History:  No FH of premature CAD  asthma: mother, 2 brothers  heart disease: mother  clotting disorders: mother (stroke)   Social History:  Recently from [place] to  [place].  Married with 2 adopted children.  Never smoked, no ETOH or drugs.  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   Review of Systems  All systems reviewed and negative except as per HPI.   Current Outpatient Medications  Medication Sig Dispense Refill  . allopurinol (ZYLOPRIM) 300 MG tablet Take 1 tablet (300 mg total) by mouth daily. 90 tablet 3  . ALOE PO Take 1 capsule by mouth 2 (two) times daily.    Marland Kitchen aspirin 81 MG EC tablet Take 1 tablet (81 mg total) by mouth daily. 90 tablet 3  . carvedilol (COREG) 25 MG tablet Take 1 tablet (25 mg total) by mouth 2 (two) times daily with a meal. 180 tablet 3  . DULoxetine (CYMBALTA) 60 MG capsule Take 1 capsule (60 mg total) by mouth daily. 90 capsule 0  . furosemide (LASIX) 40 MG tablet Take 0.5 tablets (20 mg total) by mouth daily. 45 tablet 3  . metFORMIN (GLUCOPHAGE) 500 MG tablet TAKE 1 TABLET TWICE DAILY WITH MEALS 180 tablet 1  . methocarbamol (ROBAXIN) 500 MG tablet Take 1 tablet (500 mg total) by mouth every 8 (eight) hours as needed for muscle spasms. 60 tablet 2  . potassium chloride SA (KLOR-CON) 20 MEQ tablet Take 1 tablet (20 mEq total) by mouth daily. 90 tablet 3  . rosuvastatin (CRESTOR) 20 MG tablet Take 1 tablet (20 mg total) by mouth daily. 90 tablet 3  . sacubitril-valsartan (ENTRESTO) 49-51 MG Take 1 tablet by mouth 2 (two) times daily. 180 tablet 3  . spironolactone (ALDACTONE) 25 MG tablet Take 1 tablet (25 mg total) by mouth daily. 90 tablet 3  . traZODone (DESYREL) 100 MG tablet Take 1 tablet (100 mg total) by mouth at bedtime. 90 tablet 0  . Vitamin D, Ergocalciferol, (DRISDOL) 1.25 MG (50000 UNIT) CAPS capsule Take 1 capsule (50,000 Units total) by mouth every 7 (seven) days. 4 capsule 0   No current facility-administered medications for this encounter.    Allergies  Allergen Reactions  . Abilify [Aripiprazole] Nausea Only     Vitals:   03/08/20 1546  BP: 116/78  Pulse: 60  SpO2: 98%   Weight: 133.8 kg (295 lb)   Wt Readings from Last 3 Encounters:  03/08/20 133.8 kg (295 lb)  03/03/20 134.4 kg (296 lb 3.2 oz)  03/01/20 132.9 kg (293 lb)     PHYSICAL EXAM: General: NAD, obese.  Neck: Thick, no JVD, no thyromegaly or thyroid nodule.  Lungs: Clear to auscultation bilaterally with normal respiratory effort. CV: Nondisplaced PMI.  Heart regular S1/S2, no S3/S4, no murmur.  No peripheral edema.  No carotid bruit.  Normal pedal pulses.  Abdomen: Soft, nontender, no hepatosplenomegaly, no distention.  Skin: Intact without lesions or rashes.  Neurologic: Alert and oriented x 3.  Psych: Normal affect. Extremities: No clubbing or cyanosis.  HEENT: Normal.   ASSESSMENT & PLAN: 1. Chronic systolic CHF: Nonischemic cardiomyopathy.  Echo in 1/21 showed that EF has increased to 50-55%. NYHA class III.  Robin Hardin is very inactive, but this seems more due to orthopedic issues and deconditioning than CHF.  Robin Hardin does not appear significantly volume overloaded, weight is actually down.  - Continue Lasix 20 mg daily. Recent BMET stable.   - Continue spironolactone 25 mg daily.    - Continue Entresto 49/51 bid.  - Continue Coreg 25 mg Bid. 2. OSA: Has not tolerated CPAP.   3. Obesity: Robin Hardin has lost 10 lbs, continue efforts.  4. Hyperlipidemia: Continue Crestor.    5. Depression: Robin Hardin has a psychiatrist. Think this plays a major role in her symptomatology.  6. Breast cancer: I think that patient is stable for lumpectomy this week.  Should continue Coreg peri-operatively.   Followup in 6 months                                                              Loralie Champagne 03/08/2020

## 2020-03-08 NOTE — Patient Instructions (Signed)
Take Furosemide 20 mg Daily  Please call our office in January to schedule your follow up appointment  If you have any questions or concerns before your next appointment please send [place] a message through [place] or call our office at [phone].    TO LEAVE A MESSAGE FOR THE NURSE SELECT OPTION 2, PLEASE LEAVE A MESSAGE INCLUDING: . YOUR NAME . DATE OF BIRTH . CALL BACK NUMBER . REASON FOR CALL**this is important as we prioritize the call backs  [place] AS LONG AS YOU CALL BEFORE 4:00 PM  At the [facility], you and your health needs are our priority. As part of our continuing mission to provide you with exceptional heart care, we have created designated Provider Care Teams. These Care Teams include your primary Cardiologist (physician) and Advanced Practice Providers (APPs- Physician Assistants and Nurse Practitioners) who all work together to provide you with the care you need, when you need it.   You may see any of the following providers on your designated Care Team at your next follow up: Marland Kitchen Dr Glori Bickers . Dr Loralie Champagne . Darrick Grinder, NP . Lyda Jester, PA . Audry Riles, PharmD   Please be sure to bring in all your medications bottles to every appointment.

## 2020-03-09 ENCOUNTER — Ambulatory Visit
Admission: RE | Admit: 2020-03-09 | Discharge: 2020-03-09 | Disposition: A | Discharge status: Home / Self Care | Payer: Medicare HMO | Source: Ambulatory Visit | Attending: Surgery | Admitting: Surgery

## 2020-03-09 ENCOUNTER — Other Ambulatory Visit: Payer: Self-pay

## 2020-03-09 DIAGNOSIS — Z853 Personal history of malignant neoplasm of breast: Secondary | ICD-10-CM

## 2020-03-09 DIAGNOSIS — C50912 Malignant neoplasm of unspecified site of left female breast: Secondary | ICD-10-CM | POA: Diagnosis not present

## 2020-03-09 NOTE — H&P (Signed)
[facility] Appointment: 02/17/2020 9:00 AM Location: [facility] Patient #: [id number] DOB: 17-Mar-1951 Undefined / Language: Robin Hardin / Race: Black or African American Female   History of Present Illness (Robin Hayashi A. [name] MD; 02/17/2020 11:08 AM) The patient is a 69 year old female who presents with breast cancer. Chief complaint: Left breast cancer  This is a 69 year old female who presented for screening mammography. She is found to have a suspicious area at the 3 o'clock position of the left breast. 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 She was ER/PR positive with a Ki-67 of 10%. She has had no previous problems with her breast. There is a remote family history of breast cancer in a paternal aunts. She denies nipple discharge. She has no cardiopulmonary issues. She is otherwise without complaints.   Past Surgical History Robin Slipper, RN; 02/17/2020 8:14 AM) Breast Biopsy  Left.  Diagnostic Studies History Robin Slipper, RN; 02/17/2020 8:14 AM) Colonoscopy  >10 years ago Mammogram  within last year Pap Smear  1-5 years ago  Medication History Robin Slipper, RN; 02/17/2020 8:14 AM) Medications Reconciled  Social History Robin Slipper, RN; 02/17/2020 8:14 AM) No alcohol use  No caffeine use  No drug use  Tobacco use  Never smoker.  Family History Robin Slipper, RN; 02/17/2020 8:14 AM) Breast Cancer  Family Members In General. Heart Disease  Mother. Hypertension  Mother.  Pregnancy / Birth History Robin Slipper, RN; 02/17/2020 8:14 AM) Age at menarche  19 years. Age of menopause  <45 Contraceptive History  Intrauterine device, Oral contraceptives. Gravida  6 Maternal age  35-20 Para  81  Other Problems Robin Slipper, RN; 02/17/2020 8:14 AM) Arthritis  Back Pain  Chest pain  Congestive Heart Failure  Depression  Gastroesophageal Reflux Disease  High blood pressure  Sleep Apnea      Review of Systems Robin Slipper RN; 02/17/2020 8:14 AM) General Present- Night Sweats. Not Present- Appetite Loss, Chills, Fatigue, Fever, Weight Gain and Weight Loss. Skin Not Present- Change in Wart/Mole, Dryness, Hives, Jaundice, New Lesions, Non-Healing Wounds, Rash and Ulcer. HEENT Not Present- Earache, Hearing Loss, Hoarseness, Nose Bleed, Oral Ulcers, Ringing in the Ears, Seasonal Allergies, Sinus Pain, Sore Throat, Visual Disturbances, Wears glasses/contact lenses and Yellow Eyes. Respiratory Not Present- Bloody sputum, Chronic Cough, Difficulty Breathing, Snoring and Wheezing. Breast Not Present- Breast Mass, Breast Pain, Nipple Discharge and Skin Changes. Cardiovascular Present- Swelling of Extremities. Not Present- Chest Pain, Difficulty Breathing Lying Down, Leg Cramps, Palpitations, Rapid Heart Rate and Shortness of Breath. Gastrointestinal Not Present- Abdominal Pain, Bloating, Bloody Stool, Change in Bowel Habits, Chronic diarrhea, Constipation, Difficulty Swallowing, Excessive gas, Gets full quickly at meals, Hemorrhoids, Indigestion, Nausea, Rectal Pain and Vomiting. Female Genitourinary Not Present- Frequency, Nocturia, Painful Urination, Pelvic Pain and Urgency. Musculoskeletal Present- Back Pain, Joint Pain, Muscle Pain and Swelling of Extremities. Not Present- Joint Stiffness and Muscle Weakness. Neurological Present- Trouble walking. Not Present- Decreased Memory, Fainting, Headaches, Numbness, Seizures, Tingling, Tremor and Weakness. Psychiatric Present- Depression. Not Present- Anxiety, Bipolar, Change in Sleep Pattern, Fearful and Frequent crying. Endocrine Not Present- Cold Intolerance, Excessive Hunger, Hair Changes, Heat Intolerance, Hot flashes and New Diabetes. Hematology Not Present- Blood Thinners, Easy Bruising, Excessive bleeding, Gland problems, HIV and Persistent Infections.   Physical Exam (Robin Cope A. [name] MD; 02/17/2020 11:09 AM) The physical exam  findings are as follows: Note: Morbidly obese female in no distress  There are no palpable breast masses in either  breast and no axillary adenopathy. The nipple areolar complexes are normal    Assessment & Plan (Robin Lapaglia A. [name] MD; 02/17/2020 11:11 AM) BREAST CANCER, LEFT (C50.912) Impression: I have reviewed her notes in epic as well as her mammograms, ultrasound, and biopsy results. We have discussed her also in the multidisciplinary breast cancer conference.  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 She is a candidate for breast conservation. I discussed breast conservation which means proceeding with a lumpectomy and radiation therapy versus mastectomy. We discussed the risk and benefit of each. We discussed the long-term results. She wishes to proceed with a lumpectomy. We then discussed proceeding with a radioactive seed guided left breast lumpectomy and sentinel lymph node biopsy. I discussed the reasons for biopsying lymph nodes in her axilla with her. We discussed the entire surgical procedure. We discussed the risks which includes but is not limited to bleeding, infection, injury to surrounding structures, the need for further surgery if margins or nodes are positive, cardiopulmonary issues, postoperative recovery, arm swelling, etc. She understands and wishes to proceed with surgery which will be scheduled.

## 2020-03-09 NOTE — Anesthesia Preprocedure Evaluation (Addendum)
Anesthesia Evaluation  Patient identified by MRN, date of birth, ID band Patient awake    Reviewed: Allergy & Precautions, H&P , NPO status , Patient's Chart, lab work & pertinent test results  Airway Mallampati: II   Neck ROM: full    Dental   Pulmonary shortness of breath, sleep apnea ,    breath sounds clear to auscultation       Cardiovascular hypertension, +CHF   Rhythm:regular Rate:Normal     Neuro/Psych PSYCHIATRIC DISORDERS Anxiety Depression    GI/Hepatic   Endo/Other  Morbid obesity  Renal/GU Renal InsufficiencyRenal disease     Musculoskeletal  (+) Arthritis ,   Abdominal   Peds  Hematology   Anesthesia Other Findings   Reproductive/Obstetrics                             Anesthesia Physical Anesthesia Plan  ASA: III  Anesthesia Plan: General   Post-op Pain Management:  Regional for Post-op pain   Induction: Intravenous  PONV Risk Score and Plan: 3 and Ondansetron, Dexamethasone, Midazolam and Treatment may vary due to age or medical condition  Airway Management Planned: LMA  Additional Equipment:   Intra-op Plan:   Post-operative Plan: Extubation in OR  Informed Consent: I have reviewed the patients History and Physical, chart, labs and discussed the procedure including the risks, benefits and alternatives for the proposed anesthesia with the patient or authorized representative who has indicated his/her understanding and acceptance.       Plan Discussed with: CRNA, Anesthesiologist and Surgeon  Anesthesia Plan Comments: (PAT note written by Myra Gianotti, PA-C. )       Anesthesia Quick Evaluation

## 2020-03-10 ENCOUNTER — Observation Stay (HOSPITAL_COMMUNITY)
Admission: RE | Admit: 2020-03-10 | Discharge: 2020-03-11 | Disposition: A | Discharge status: Home / Self Care | Payer: Medicare HMO | Attending: Surgery | Admitting: Surgery

## 2020-03-10 ENCOUNTER — Ambulatory Visit
Admission: RE | Admit: 2020-03-10 | Discharge: 2020-03-10 | Disposition: A | Discharge status: Home / Self Care | Payer: Medicare HMO | Source: Ambulatory Visit | Attending: Surgery | Admitting: Surgery

## 2020-03-10 ENCOUNTER — Ambulatory Visit (HOSPITAL_COMMUNITY): Payer: Medicare HMO | Admitting: Vascular Surgery

## 2020-03-10 ENCOUNTER — Other Ambulatory Visit: Payer: Self-pay

## 2020-03-10 ENCOUNTER — Encounter (HOSPITAL_COMMUNITY)
Admission: RE | Admit: 2020-03-10 | Discharge: 2020-03-10 | Disposition: A | Discharge status: Home / Self Care | Payer: Medicare HMO | Source: Ambulatory Visit | Attending: Surgery | Admitting: Surgery

## 2020-03-10 ENCOUNTER — Encounter (HOSPITAL_COMMUNITY): Payer: Self-pay | Admitting: Surgery

## 2020-03-10 ENCOUNTER — Encounter (HOSPITAL_COMMUNITY)
Admission: RE | Disposition: A | Discharge status: Home / Self Care | Payer: Self-pay | Source: Home / Self Care | Attending: Surgery

## 2020-03-10 ENCOUNTER — Other Ambulatory Visit (HOSPITAL_COMMUNITY): Payer: Medicare HMO

## 2020-03-10 ENCOUNTER — Ambulatory Visit (HOSPITAL_COMMUNITY): Payer: Medicare HMO | Admitting: Certified Registered Nurse Anesthetist

## 2020-03-10 DIAGNOSIS — C50912 Malignant neoplasm of unspecified site of left female breast: Principal | ICD-10-CM | POA: Insufficient documentation

## 2020-03-10 DIAGNOSIS — C50411 Malignant neoplasm of upper-outer quadrant of right female breast: Secondary | ICD-10-CM | POA: Diagnosis not present

## 2020-03-10 DIAGNOSIS — G8918 Other acute postprocedural pain: Secondary | ICD-10-CM | POA: Diagnosis not present

## 2020-03-10 DIAGNOSIS — Z170 Estrogen receptor positive status [ER+]: Secondary | ICD-10-CM | POA: Diagnosis not present

## 2020-03-10 DIAGNOSIS — Z853 Personal history of malignant neoplasm of breast: Secondary | ICD-10-CM

## 2020-03-10 DIAGNOSIS — E785 Hyperlipidemia, unspecified: Secondary | ICD-10-CM | POA: Diagnosis not present

## 2020-03-10 HISTORY — PX: BREAST LUMPECTOMY WITH RADIOACTIVE SEED AND SENTINEL LYMPH NODE BIOPSY: SHX6550

## 2020-03-10 LAB — GLUCOSE, CAPILLARY: Glucose-Capillary: 232 mg/dL — ABNORMAL HIGH (ref 70–99)

## 2020-03-10 LAB — HEMOGLOBIN A1C
Hgb A1c MFr Bld: 5.5 % (ref 4.8–5.6)
Mean Plasma Glucose: 111.15 mg/dL

## 2020-03-10 SURGERY — BREAST LUMPECTOMY WITH RADIOACTIVE SEED AND SENTINEL LYMPH NODE BIOPSY
Anesthesia: General | Site: Breast | Laterality: Left

## 2020-03-10 MED ORDER — ORAL CARE MOUTH RINSE: 15.0000 mL | Freq: Once | OROMUCOSAL | Status: AC

## 2020-03-10 MED ORDER — MIDAZOLAM HCL 2 MG/2ML IJ SOLN: 2.0000 mg | Freq: Once | INTRAMUSCULAR | Status: AC

## 2020-03-10 MED ORDER — SODIUM CHLORIDE 0.9 % IV SOLN: INTRAVENOUS | Status: DC

## 2020-03-10 MED ORDER — ACETAMINOPHEN 500 MG PO TABS
1000.0000 mg | ORAL_TABLET | ORAL | Status: AC
  Administered 2020-03-10: 1000 mg via ORAL
  Filled 2020-03-10: qty 2

## 2020-03-10 MED ORDER — PHENYLEPHRINE HCL-NACL 10-0.9 MG/250ML-% IV SOLN: INTRAVENOUS | Status: DC | PRN

## 2020-03-10 MED ORDER — OXYCODONE HCL 5 MG PO TABS: 5.0000 mg | ORAL_TABLET | Freq: Once | ORAL | Status: DC | PRN

## 2020-03-10 MED ORDER — DULOXETINE HCL 60 MG PO CPEP
60.0000 mg | ORAL_CAPSULE | Freq: Every day | ORAL | Status: DC
  Administered 2020-03-11: 60 mg via ORAL
  Filled 2020-03-10: qty 1

## 2020-03-10 MED ORDER — CHLORHEXIDINE GLUCONATE CLOTH 2 % EX PADS: 6.0000 | MEDICATED_PAD | Freq: Once | CUTANEOUS | Status: DC

## 2020-03-10 MED ORDER — GLYCOPYRROLATE 0.2 MG/ML IJ SOLN
INTRAMUSCULAR | Status: DC | PRN
Start: 2020-03-10 — End: 2020-03-10
  Administered 2020-03-10: .2 mg via INTRAVENOUS

## 2020-03-10 MED ORDER — POTASSIUM CHLORIDE CRYS ER 20 MEQ PO TBCR
20.0000 meq | EXTENDED_RELEASE_TABLET | Freq: Every day | ORAL | Status: DC
  Administered 2020-03-10 – 2020-03-11 (×2): 20 meq via ORAL
  Filled 2020-03-10 (×2): qty 1

## 2020-03-10 MED ORDER — ENSURE PRE-SURGERY PO LIQD: 296.0000 mL | Freq: Once | ORAL | Status: DC

## 2020-03-10 MED ORDER — PHENYLEPHRINE 40 MCG/ML (10ML) SYRINGE FOR IV PUSH (FOR BLOOD PRESSURE SUPPORT)
PREFILLED_SYRINGE | INTRAVENOUS | Status: DC | PRN
  Administered 2020-03-10 (×3): 80 ug via INTRAVENOUS
  Administered 2020-03-10: 120 ug via INTRAVENOUS

## 2020-03-10 MED ORDER — TECHNETIUM TC 99M SULFUR COLLOID FILTERED
1.0000 | Freq: Once | INTRAVENOUS | Status: AC | PRN
  Administered 2020-03-10: 1 via INTRADERMAL

## 2020-03-10 MED ORDER — FENTANYL CITRATE (PF) 100 MCG/2ML IJ SOLN: 100.0000 ug | Freq: Once | INTRAMUSCULAR | Status: AC

## 2020-03-10 MED ORDER — MIDAZOLAM HCL 2 MG/2ML IJ SOLN
INTRAMUSCULAR | Status: AC
  Filled 2020-03-10: qty 2

## 2020-03-10 MED ORDER — LACTATED RINGERS IV SOLN: INTRAVENOUS | Status: DC

## 2020-03-10 MED ORDER — FENTANYL CITRATE (PF) 100 MCG/2ML IJ SOLN: 25.0000 ug | INTRAMUSCULAR | Status: DC | PRN

## 2020-03-10 MED ORDER — BUPIVACAINE-EPINEPHRINE 0.5% -1:200000 IJ SOLN
INTRAMUSCULAR | Status: AC
  Filled 2020-03-10: qty 1

## 2020-03-10 MED ORDER — METFORMIN HCL 500 MG PO TABS
500.0000 mg | ORAL_TABLET | Freq: Two times a day (BID) | ORAL | Status: DC
  Administered 2020-03-11: 500 mg via ORAL
  Filled 2020-03-10: qty 1

## 2020-03-10 MED ORDER — CARVEDILOL 25 MG PO TABS
25.0000 mg | ORAL_TABLET | Freq: Two times a day (BID) | ORAL | Status: DC
  Administered 2020-03-11: 25 mg via ORAL
  Filled 2020-03-10: qty 1

## 2020-03-10 MED ORDER — FENTANYL CITRATE (PF) 100 MCG/2ML IJ SOLN
INTRAMUSCULAR | Status: AC
  Administered 2020-03-10: 100 ug via INTRAVENOUS
  Filled 2020-03-10: qty 2

## 2020-03-10 MED ORDER — ONDANSETRON HCL 4 MG/2ML IJ SOLN: 4.0000 mg | Freq: Four times a day (QID) | INTRAMUSCULAR | Status: DC | PRN

## 2020-03-10 MED ORDER — ROSUVASTATIN CALCIUM 20 MG PO TABS
20.0000 mg | ORAL_TABLET | Freq: Every day | ORAL | Status: DC
  Administered 2020-03-11: 20 mg via ORAL
  Filled 2020-03-10: qty 1

## 2020-03-10 MED ORDER — METHOCARBAMOL 500 MG PO TABS
500.0000 mg | ORAL_TABLET | Freq: Three times a day (TID) | ORAL | Status: DC | PRN
  Administered 2020-03-10: 500 mg via ORAL
  Filled 2020-03-10: qty 1

## 2020-03-10 MED ORDER — LIDOCAINE 2% (20 MG/ML) 5 ML SYRINGE
INTRAMUSCULAR | Status: DC | PRN
  Administered 2020-03-10: 80 mg via INTRAVENOUS

## 2020-03-10 MED ORDER — DEXTROSE 5 % IV SOLN
3.0000 g | INTRAVENOUS | Status: AC
  Administered 2020-03-10: 3 g via INTRAVENOUS
  Filled 2020-03-10: qty 3

## 2020-03-10 MED ORDER — PHENYLEPHRINE HCL-NACL 10-0.9 MG/250ML-% IV SOLN
INTRAVENOUS | Status: DC | PRN
  Administered 2020-03-10: 25 ug/min via INTRAVENOUS

## 2020-03-10 MED ORDER — BUPIVACAINE-EPINEPHRINE (PF) 0.5% -1:200000 IJ SOLN
INTRAMUSCULAR | Status: DC | PRN
Start: 2020-03-10 — End: 2020-03-10
  Administered 2020-03-10: 30 mL via PERINEURAL

## 2020-03-10 MED ORDER — FENTANYL CITRATE (PF) 250 MCG/5ML IJ SOLN
INTRAMUSCULAR | Status: DC | PRN
  Administered 2020-03-10: 50 ug via INTRAVENOUS

## 2020-03-10 MED ORDER — EPHEDRINE SULFATE-NACL 50-0.9 MG/10ML-% IV SOSY
PREFILLED_SYRINGE | INTRAVENOUS | Status: DC | PRN
  Administered 2020-03-10 (×2): 10 mg via INTRAVENOUS

## 2020-03-10 MED ORDER — BUPIVACAINE-EPINEPHRINE 0.25% -1:200000 IJ SOLN
INTRAMUSCULAR | Status: DC | PRN
  Administered 2020-03-10: 10 mL

## 2020-03-10 MED ORDER — OXYCODONE HCL 5 MG/5ML PO SOLN: 5.0000 mg | Freq: Once | ORAL | Status: DC | PRN

## 2020-03-10 MED ORDER — ENOXAPARIN SODIUM 40 MG/0.4ML ~~LOC~~ SOLN
40.0000 mg | SUBCUTANEOUS | Status: DC
  Administered 2020-03-11: 40 mg via SUBCUTANEOUS
  Filled 2020-03-10: qty 0.4

## 2020-03-10 MED ORDER — PROPOFOL 10 MG/ML IV BOLUS
INTRAVENOUS | Status: DC | PRN
  Administered 2020-03-10: 150 mg via INTRAVENOUS
  Administered 2020-03-10: 50 mg via INTRAVENOUS

## 2020-03-10 MED ORDER — DEXAMETHASONE SODIUM PHOSPHATE 10 MG/ML IJ SOLN
INTRAMUSCULAR | Status: DC | PRN
  Administered 2020-03-10: 5 mg via INTRAVENOUS

## 2020-03-10 MED ORDER — CHLORHEXIDINE GLUCONATE 0.12 % MT SOLN
15.0000 mL | Freq: Once | OROMUCOSAL | Status: AC
  Administered 2020-03-10: 15 mL via OROMUCOSAL
  Filled 2020-03-10: qty 15

## 2020-03-10 MED ORDER — FENTANYL CITRATE (PF) 250 MCG/5ML IJ SOLN
INTRAMUSCULAR | Status: AC
  Filled 2020-03-10: qty 5

## 2020-03-10 MED ORDER — FUROSEMIDE 20 MG PO TABS
20.0000 mg | ORAL_TABLET | Freq: Every day | ORAL | Status: DC
  Administered 2020-03-10 – 2020-03-11 (×2): 20 mg via ORAL
  Filled 2020-03-10 (×2): qty 1

## 2020-03-10 MED ORDER — INSULIN ASPART 100 UNIT/ML ~~LOC~~ SOLN: 0.0000 [IU] | Freq: Three times a day (TID) | SUBCUTANEOUS | Status: DC

## 2020-03-10 MED ORDER — DIPHENHYDRAMINE HCL 12.5 MG/5ML PO ELIX: 12.5000 mg | ORAL_SOLUTION | Freq: Four times a day (QID) | ORAL | Status: DC | PRN

## 2020-03-10 MED ORDER — MIDAZOLAM HCL 2 MG/2ML IJ SOLN
INTRAMUSCULAR | Status: DC | PRN
  Administered 2020-03-10: 2 mg via INTRAVENOUS

## 2020-03-10 MED ORDER — ONDANSETRON HCL 4 MG/2ML IJ SOLN
INTRAMUSCULAR | Status: DC | PRN
  Administered 2020-03-10: 4 mg via INTRAVENOUS

## 2020-03-10 MED ORDER — TRAZODONE HCL 100 MG PO TABS
100.0000 mg | ORAL_TABLET | Freq: Every day | ORAL | Status: DC
  Administered 2020-03-10: 100 mg via ORAL
  Filled 2020-03-10: qty 1

## 2020-03-10 MED ORDER — GABAPENTIN 300 MG PO CAPS
300.0000 mg | ORAL_CAPSULE | ORAL | Status: AC
  Administered 2020-03-10: 300 mg via ORAL
  Filled 2020-03-10: qty 1

## 2020-03-10 MED ORDER — GLYCOPYRROLATE PF 0.2 MG/ML IJ SOSY
PREFILLED_SYRINGE | INTRAMUSCULAR | Status: AC
  Filled 2020-03-10: qty 1

## 2020-03-10 MED ORDER — OXYCODONE HCL 5 MG PO TABS
5.0000 mg | ORAL_TABLET | ORAL | Status: DC | PRN
  Administered 2020-03-10 – 2020-03-11 (×2): 10 mg via ORAL
  Filled 2020-03-10 (×2): qty 2

## 2020-03-10 MED ORDER — MIDAZOLAM HCL 2 MG/2ML IJ SOLN
INTRAMUSCULAR | Status: AC
  Administered 2020-03-10: 2 mg via INTRAVENOUS
  Filled 2020-03-10: qty 2

## 2020-03-10 MED ORDER — ONDANSETRON 4 MG PO TBDP: 4.0000 mg | ORAL_TABLET | Freq: Four times a day (QID) | ORAL | Status: DC | PRN

## 2020-03-10 MED ORDER — SPIRONOLACTONE 25 MG PO TABS
25.0000 mg | ORAL_TABLET | Freq: Every day | ORAL | Status: DC
  Administered 2020-03-10 – 2020-03-11 (×2): 25 mg via ORAL
  Filled 2020-03-10 (×2): qty 1

## 2020-03-10 MED ORDER — 0.9 % SODIUM CHLORIDE (POUR BTL) OPTIME
TOPICAL | Status: DC | PRN
  Administered 2020-03-10: 1000 mL

## 2020-03-10 MED ORDER — METHYLENE BLUE 0.5 % INJ SOLN
INTRAVENOUS | Status: AC
  Filled 2020-03-10: qty 10

## 2020-03-10 MED ORDER — PROPOFOL 10 MG/ML IV BOLUS
INTRAVENOUS | Status: AC
  Filled 2020-03-10: qty 20

## 2020-03-10 MED ORDER — DIPHENHYDRAMINE HCL 50 MG/ML IJ SOLN: 12.5000 mg | Freq: Four times a day (QID) | INTRAMUSCULAR | Status: DC | PRN

## 2020-03-10 MED ORDER — TRAMADOL HCL 50 MG PO TABS
50.0000 mg | ORAL_TABLET | Freq: Four times a day (QID) | ORAL | Status: DC | PRN
  Administered 2020-03-10: 50 mg via ORAL
  Filled 2020-03-10: qty 1

## 2020-03-10 MED ORDER — EPHEDRINE 5 MG/ML INJ
INTRAVENOUS | Status: AC
  Filled 2020-03-10: qty 10

## 2020-03-10 MED ORDER — CLONIDINE HCL (ANALGESIA) 100 MCG/ML EP SOLN
EPIDURAL | Status: DC | PRN
Start: 2020-03-10 — End: 2020-03-10
  Administered 2020-03-10: 50 ug

## 2020-03-10 SURGICAL SUPPLY — 36 items
APPLIER CLIP 9.375 MED OPEN (MISCELLANEOUS) ×2
BINDER BREAST LRG (GAUZE/BANDAGES/DRESSINGS) IMPLANT
BINDER BREAST XLRG (GAUZE/BANDAGES/DRESSINGS) IMPLANT
CANISTER SUCT 3000ML PPV (MISCELLANEOUS) ×2 IMPLANT
CHLORAPREP W/TINT 26 (MISCELLANEOUS) ×2 IMPLANT
CLIP APPLIE 9.375 MED OPEN (MISCELLANEOUS) ×1 IMPLANT
COVER PROBE W GEL 5X96 (DRAPES) ×2 IMPLANT
COVER SURGICAL LIGHT HANDLE (MISCELLANEOUS) ×2 IMPLANT
COVER WAND RF STERILE (DRAPES) ×2 IMPLANT
DERMABOND ADHESIVE PROPEN (GAUZE/BANDAGES/DRESSINGS) ×1
DERMABOND ADVANCED (GAUZE/BANDAGES/DRESSINGS) ×1
DERMABOND ADVANCED .7 DNX12 (GAUZE/BANDAGES/DRESSINGS) ×1 IMPLANT
DERMABOND ADVANCED .7 DNX6 (GAUZE/BANDAGES/DRESSINGS) ×1 IMPLANT
DEVICE DUBIN SPECIMEN MAMMOGRA (MISCELLANEOUS) ×2 IMPLANT
DRAPE CHEST BREAST 15X10 FENES (DRAPES) ×2 IMPLANT
ELECT CAUTERY BLADE 6.4 (BLADE) ×2 IMPLANT
ELECT REM PT RETURN 9FT ADLT (ELECTROSURGICAL) ×2
ELECTRODE REM PT RTRN 9FT ADLT (ELECTROSURGICAL) ×1 IMPLANT
GLOVE SURG SIGNA 7.5 PF LTX (GLOVE) ×2 IMPLANT
GOWN STRL REUS W/ TWL LRG LVL3 (GOWN DISPOSABLE) ×1 IMPLANT
GOWN STRL REUS W/ TWL XL LVL3 (GOWN DISPOSABLE) ×1 IMPLANT
GOWN STRL REUS W/TWL LRG LVL3 (GOWN DISPOSABLE) ×1
GOWN STRL REUS W/TWL XL LVL3 (GOWN DISPOSABLE) ×1
KIT BASIN OR (CUSTOM PROCEDURE TRAY) ×2 IMPLANT
KIT MARKER MARGIN INK (KITS) ×2 IMPLANT
NEEDLE 18GX1X1/2 (RX/OR ONLY) (NEEDLE) IMPLANT
NEEDLE FILTER BLUNT 18X 1/2SAF (NEEDLE)
NEEDLE FILTER BLUNT 18X1 1/2 (NEEDLE) IMPLANT
NEEDLE HYPO 25GX1X1/2 BEV (NEEDLE) ×2 IMPLANT
NS IRRIG 1000ML POUR BTL (IV SOLUTION) ×2 IMPLANT
PACK GENERAL/GYN (CUSTOM PROCEDURE TRAY) ×2 IMPLANT
SUT MNCRL AB 4-0 PS2 18 (SUTURE) ×2 IMPLANT
SUT VIC AB 3-0 SH 18 (SUTURE) ×2 IMPLANT
SYR CONTROL 10ML LL (SYRINGE) ×2 IMPLANT
TOWEL GREEN STERILE (TOWEL DISPOSABLE) ×2 IMPLANT
TOWEL GREEN STERILE FF (TOWEL DISPOSABLE) ×2 IMPLANT

## 2020-03-10 NOTE — Interval H&P Note (Signed)
History and Physical Interval Note:no change in H and P  03/10/2020 3:12 PM  Robin Hardin  has presented today for surgery, with the diagnosis of LEFT BREAST CANCER.  The various methods of treatment have been discussed with the patient and family. After consideration of risks, benefits and other options for treatment, the patient has consented to  Procedure(s): LEFT BREAST LUMPECTOMY WITH RADIOACTIVE SEED AND SENTINEL LYMPH NODE BIOPSY (Left) as a surgical intervention.  The patient's history has been reviewed, patient examined, no change in status, stable for surgery.  I have reviewed the patient's chart and labs.  Questions were answered to the patient's satisfaction.     Coralie Keens

## 2020-03-10 NOTE — Anesthesia Procedure Notes (Signed)
Anesthesia Regional Block: Pectoralis block   Pre-Anesthetic Checklist: ,, timeout performed, Correct Patient, Correct Site, Correct Laterality, Correct Procedure, Correct Position, site marked, Risks and benefits discussed,  Surgical consent,  Pre-op evaluation,  At surgeon's request and post-op pain management  Laterality: Left  Prep: chloraprep       Needles:  Injection technique: Single-shot  Needle Type: Echogenic Needle     Needle Length: 9cm  Needle Gauge: 21     Additional Needles:   Narrative:  Start time: 03/10/2020 2:40 PM End time: 03/10/2020 2:48 PM Injection made incrementally with aspirations every 5 mL.  Performed by: Personally  Anesthesiologist: Albertha Ghee, MD  Additional Notes: Pt tolerated the procedure well.

## 2020-03-10 NOTE — Progress Notes (Signed)
Pt called CSW earlier today to discuss her upcoming procedure on Thursday.  States she would like CSW to help place her husband at SNF for Thursday night because she will have to spend the night and she is worried about him managing on his own without her especially with having dialysis on Friday.  Pt spouse had stayed at [facility] previously so CSW called and spoke to admissions coordinator to see if that was a possibility- reports it would be $320 private pay because insurance would not cover stay for unskilled need and that minimum stay would be 5 nights- cannot prepare a room and get medications just for one night stay.  CSW explained this to pt and discussed alternatives.  Pt states that her spouse can complete all ADLs independently at this point and she has a friend coming by to bring him dinner on Thursday- main concern is him not getting ready in time for [facility] to pick him up for dialysis Friday morning.  CSW stated only options would be to hire private aid- would be $20-25/hour and usually has a minium number of hours and unsure if this could be arranged on such short notice.  Suggested pt check with her friend who is bringing meals to see if they could come by Friday morning and make sure that pt spouse is getting ready on time for dialysis- pt will look into and call CSW back if this is not an option.  Will continue to follow and assist as needed  Jorge Ny, [facility] Desk#: [phone] Cell#: [phone]

## 2020-03-10 NOTE — Op Note (Signed)
Robin Hardin 03/10/2020   Pre-op Diagnosis: LEFT BREAST CANCER     Post-op Diagnosis: same  Procedure(s): LEFT BREAST LUMPECTOMY WITH RADIOACTIVE SEED AND DEEP AXILLARY SENTINEL LYMPH NODE BIOPSY  Surgeon(s): Coralie Keens, MD  Anesthesia: General  Staff:  Circulator: Ma Rings, RN Scrub Person: Seals, Theme park manager: Sadie Haber, RN; Riojas, Marita Kansas, RN  Estimated Blood Loss: Minimal               Specimens: sent to path  Indications: This is a 69 year old female with a recent diagnosis of left breast invasive ductal carcinoma as well as ductal carcinoma in situ.  After discussion in our cancer clinic, the decision was made to proceed with a radioactive seed guided left breast lumpectomy and sentinel lymph node biopsy  Procedure: Patient was brought to operating identifies correct patient.  She is placed upon the operating table general anesthesia was induced.  Her left breast and axilla were then prepped and draped in usual sterile fashion.  Identified the radioactive seed at the 3 o'clock position of the left breast almost 7 cm from the nipple areolar complex.  I anesthetized skin over this area with Marcaine and made incision with a scalpel.  I performed a wide lumpectomy with the aid of neoprobe staying widely around the seed going all the way down the chest wall.  Once the lumpectomy was completed I marked the margins with paint and x-rayed the specimen.  This confirmed the radioactive seed and previous tissue marker were in the specimen.  This was then sent to pathology for evaluation.  Next with the neoprobe identified an area of increased uptake of radioactive isotope in the left axilla.  I anesthetized skin with Marcaine made incision with a scalpel.  I then dissected down into the deep axillary tissue.  The patient had a lot of lymph nodes in 2 separate groups of fat with increased uptake of radioactive isotope to these were all removed  in their entirety with cautery.  I then evaluate the nodal basin and saw no other increased uptake of radioactive isotope with the neoprobe.  At this point I placed surgical clips at the periphery of the lumpectomy cavity.  I anesthetized the incisions further with Marcaine.  I then closed subcutaneous tissue with interrupted 3-0 Vicryl sutures closed skin with a running 4-0 Monocryl both incisions.  Dermabond was then applied.  The patient tolerated the procedure well.  All the counts were correct at the end of the procedure.  The patient was then extubated in the operating room and taken in stable condition to the recovery room.          Coralie Keens   Date: 03/10/2020  Time: 4:15 PM

## 2020-03-10 NOTE — Anesthesia Procedure Notes (Signed)
Procedure Name: LMA Insertion Date/Time: 03/10/2020 3:40 PM Performed by: Kathryne Hitch, CRNA Pre-anesthesia Checklist: Patient identified, Emergency Drugs available, Suction available and Patient being monitored Patient Re-evaluated:Patient Re-evaluated prior to induction Oxygen Delivery Method: Circle system utilized Preoxygenation: Pre-oxygenation with 100% oxygen Induction Type: IV induction Ventilation: Mask ventilation without difficulty LMA: LMA inserted LMA Size: 4.0 Number of attempts: 1 Placement Confirmation: positive ETCO2 and breath sounds checked- equal and bilateral Tube secured with: Tape Dental Injury: Teeth and Oropharynx as per pre-operative assessment

## 2020-03-10 NOTE — Anesthesia Postprocedure Evaluation (Signed)
Anesthesia Post Note  Patient: Robin Hardin  Procedure(s) Performed: LEFT BREAST LUMPECTOMY WITH RADIOACTIVE SEED AND SENTINEL LYMPH NODE BIOPSY (Left Breast)     Patient location during evaluation: PACU Anesthesia Type: General Level of consciousness: awake and alert Pain management: pain level controlled Vital Signs Assessment: post-procedure vital signs reviewed and stable Respiratory status: spontaneous breathing, nonlabored ventilation, respiratory function stable and patient connected to nasal cannula oxygen Cardiovascular status: blood pressure returned to baseline and stable Postop Assessment: no apparent nausea or vomiting Anesthetic complications: no   No complications documented.  Last Vitals:  Vitals:   03/10/20 1945 03/10/20 2000  BP:  (!) 109/57  Pulse: 70 70  Resp: 22 16  Temp:    SpO2: 91% 99%    Last Pain:  Vitals:   03/10/20 2000  TempSrc:   PainSc: 0-No pain                 Catalina Gravel

## 2020-03-10 NOTE — Addendum Note (Signed)
Encounter addended by: Jorge Ny, LCSW on: 03/10/2020 9:37 AM  Actions taken: Clinical Note Signed

## 2020-03-10 NOTE — Transfer of Care (Signed)
Immediate Anesthesia Transfer of Care Note  Patient: Robin Hardin  Procedure(s) Performed: LEFT BREAST LUMPECTOMY WITH RADIOACTIVE SEED AND SENTINEL LYMPH NODE BIOPSY (Left Breast)  Patient Location: PACU  Anesthesia Type:General  Level of Consciousness: drowsy and patient cooperative  Airway & Oxygen Therapy: Patient Spontanous Breathing  Post-op Assessment: Report given to RN and Post -op Vital signs reviewed and stable  Post vital signs: Reviewed and stable  Last Vitals:  Vitals Value Taken Time  BP 144/79 03/10/20 1623  Temp    Pulse 74 03/10/20 1626  Resp 14 03/10/20 1626  SpO2 100 % 03/10/20 1626  Vitals shown include unvalidated device data.  Last Pain:  Vitals:   03/10/20 1448  TempSrc:   PainSc: 0-No pain      Patients Stated Pain Goal: 4 (06/19/58 4585)  Complications: No complications documented.

## 2020-03-11 ENCOUNTER — Encounter (HOSPITAL_COMMUNITY): Payer: Self-pay | Admitting: Surgery

## 2020-03-11 LAB — GLUCOSE, CAPILLARY
Glucose-Capillary: 110 mg/dL — ABNORMAL HIGH (ref 70–99)
Glucose-Capillary: 134 mg/dL — ABNORMAL HIGH (ref 70–99)

## 2020-03-11 MED ORDER — TRAMADOL HCL 50 MG PO TABS: 50.0000 mg | ORAL_TABLET | Freq: Four times a day (QID) | ORAL | 0 refills | Status: DC | PRN

## 2020-03-11 NOTE — Progress Notes (Signed)
[name], female   DOB: Nov 17, 1950, 68 y.o.   MRN: [id number]  Doing well Incisions clean Plan: Discharge home

## 2020-03-11 NOTE — Discharge Instructions (Signed)
[place] Office Phone Number [phone]  BREAST BIOPSY/ PARTIAL MASTECTOMY: POST OP INSTRUCTIONS  Always review your discharge instruction sheet given to you by the facility where your surgery was performed.  IF YOU HAVE DISABILITY OR FAMILY LEAVE FORMS, YOU MUST BRING THEM TO THE OFFICE FOR PROCESSING.  DO NOT GIVE THEM TO YOUR DOCTOR.  1. A prescription for pain medication may be given to you upon discharge.  Take your pain medication as prescribed, if needed.  If narcotic pain medicine is not needed, then you may take acetaminophen (Tylenol) or ibuprofen (Advil) as needed. 2. Take your usually prescribed medications unless otherwise directed 3. If you need a refill on your pain medication, please contact your pharmacy.  They will contact our office to request authorization.  Prescriptions will not be filled after 5pm or on week-ends. 4. You should eat very light the first 24 hours after surgery, such as soup, crackers, pudding, etc.  Resume your normal diet the day after surgery. 5. Most patients will experience some swelling and bruising in the breast.  Ice packs and a good support bra will help.  Swelling and bruising can take several days to resolve.  6. It is common to experience some constipation if taking pain medication after surgery.  Increasing fluid intake and taking a stool softener will usually help or prevent this problem from occurring.  A mild laxative (Milk of Magnesia or Miralax) should be taken according to package directions if there are no bowel movements after 48 hours. 7. Unless discharge instructions indicate otherwise, you may remove your bandages 24-48 hours after surgery, and you may shower at that time.  You may have steri-strips (small skin tapes) in place directly over the incision.  These strips should be left on the skin for 7-10 days.  If your surgeon used skin glue on the incision, you may shower in 24 hours.  The glue will flake off over the  next 2-3 weeks.  Any sutures or staples will be removed at the office during your follow-up visit. 8. ACTIVITIES:  You may resume regular daily activities (gradually increasing) beginning the next day.  Wearing a good support bra or sports bra minimizes pain and swelling.  You may have sexual intercourse when it is comfortable. a. You may drive when you no longer are taking prescription pain medication, you can comfortably wear a seatbelt, and you can safely maneuver your car and apply brakes. b. RETURN TO WORK:  ______________________________________________________________________________________ 9. You should see your doctor in the office for a follow-up appointment approximately two weeks after your surgery.  Your doctor's nurse will typically make your follow-up appointment when she calls you with your pathology report.  Expect your pathology report 2-3 business days after your surgery.  You may call to check if you do not hear from [place] after three days. 10. OTHER INSTRUCTIONS:OK TO SHOWER STARTING TODAY 11. ICE PACK, TYLENOL, AND IBUPROFEN ALSO FOR PAIN 12. NO VIGOROUS ACTIVITY FOR ONE WEEK _______________________________________________________________________________________________ _____________________________________________________________________________________________________________________________________ _____________________________________________________________________________________________________________________________________ _____________________________________________________________________________________________________________________________________  WHEN TO CALL YOUR DOCTOR: 1. Fever over 101.0 2. Nausea and/or vomiting. 3. Extreme swelling or bruising. 4. Continued bleeding from incision. 5. Increased pain, redness, or drainage from the incision.  The clinic staff is available to answer your questions during regular business hours.  Please don't hesitate to call  and ask to speak to one of the nurses for clinical concerns.  If you have a medical emergency, go to the nearest emergency room or call 911.  A  surgeon from [facility] is always on call at the hospital.  For further questions, please visit [email or web address]

## 2020-03-11 NOTE — Progress Notes (Signed)
Received pt alert and oriented x 4. Pt transferred to bed with 2- assist. Vital signs within normal limit. Will monitor pt.

## 2020-03-11 NOTE — Discharge Summary (Signed)
Physician Discharge Summary  Patient ID: SHILYNN HOCH MRN: [id number] DOB/AGE: 69/07/1951 69 y.o.  Admit date: 03/10/2020 Discharge date: 03/11/2020  Admission Diagnoses:  Discharge Diagnoses:  Active Problems:   History of left breast cancer   Discharged Condition: good  Hospital Course: uneventful post op recover.  Kept overnight as she lives alone.  Discharged home POD#1  Consults: None  Significant Diagnostic Studies:   Treatments: surgery: left breast lumpectomy and sentinel node biopsy  Discharge Exam: Blood pressure 119/74, pulse 58, temperature 98.3 F (36.8 C), temperature source Oral, resp. rate 17, height _0  (1.6 m), weight (!) 131.1 kg, SpO2 95 %. General appearance: alert, cooperative and no distress Resp: clear to auscultation bilaterally Cardio: regular rate and rhythm, S1, S2 normal, no murmur, click, rub or gallop Incision/Wound:incisions clean  Disposition: Discharge disposition: 01-Home or Self Care        Allergies as of 03/11/2020      Reactions   Abilify [aripiprazole] Nausea Only      Medication List    TAKE these medications   allopurinol 300 MG tablet Commonly known as: ZYLOPRIM Take 1 tablet (300 mg total) by mouth daily.   ALOE PO Take 1 capsule by mouth 2 (two) times daily.   APPLE CIDER VINEGAR DIET PO Take 1 tablet by mouth daily.   aspirin 81 MG EC tablet Take 1 tablet (81 mg total) by mouth daily.   carvedilol 25 MG tablet Commonly known as: COREG Take 1 tablet (25 mg total) by mouth 2 (two) times daily with a meal.   DULoxetine 60 MG capsule Commonly known as: CYMBALTA Take 1 capsule (60 mg total) by mouth daily.   Entresto 49-51 MG Generic drug: sacubitril-valsartan Take 1 tablet by mouth 2 (two) times daily.   furosemide 40 MG tablet Commonly known as: LASIX Take 0.5 tablets (20 mg total) by mouth daily.   Ginkgo Biloba 40 MG Tabs Take 1 tablet by mouth daily.   HAIR SKIN & NAILS GUMMIES PO Take  1 Dose by mouth daily.   metFORMIN 500 MG tablet Commonly known as: GLUCOPHAGE TAKE 1 TABLET TWICE DAILY WITH MEALS   methocarbamol 500 MG tablet Commonly known as: Robaxin Take 1 tablet (500 mg total) by mouth every 8 (eight) hours as needed for muscle spasms.   OVER THE COUNTER MEDICATION Take 1 Dose by mouth daily. Fennel   potassium chloride SA 20 MEQ tablet Commonly known as: KLOR-CON Take 1 tablet (20 mEq total) by mouth daily.   rosuvastatin 20 MG tablet Commonly known as: CRESTOR Take 1 tablet (20 mg total) by mouth daily.   spironolactone 25 MG tablet Commonly known as: ALDACTONE Take 1 tablet (25 mg total) by mouth daily.   traMADol 50 MG tablet Commonly known as: ULTRAM Take 1 tablet (50 mg total) by mouth every 6 (six) hours as needed.   traZODone 100 MG tablet Commonly known as: DESYREL Take 1 tablet (100 mg total) by mouth at bedtime.   Vitamin D (Ergocalciferol) 1.25 MG (50000 UNIT) Caps capsule Commonly known as: DRISDOL Take 1 capsule (50,000 Units total) by mouth every 7 (seven) days.        Signed: Coralie Keens 03/11/2020, 8:26 AM

## 2020-03-11 NOTE — Progress Notes (Signed)
D/C summary given to pt.  Pt verbalized understanding to all d/c instructions.  IV site d/c with no issues.  Pt is stable and in no distress.  Pt transported to main entrance via w/c

## 2020-03-13 ENCOUNTER — Other Ambulatory Visit: Payer: Self-pay | Admitting: Internal Medicine

## 2020-03-13 DIAGNOSIS — I5042 Chronic combined systolic (congestive) and diastolic (congestive) heart failure: Secondary | ICD-10-CM

## 2020-03-15 ENCOUNTER — Telehealth: Payer: Self-pay | Admitting: *Deleted

## 2020-03-15 ENCOUNTER — Encounter: Payer: Self-pay | Admitting: *Deleted

## 2020-03-15 NOTE — Telephone Encounter (Signed)
Received order for oncotype testing. Requisition faxed to pathology and [facility]

## 2020-03-17 ENCOUNTER — Inpatient Hospital Stay: Payer: Medicare HMO | Attending: Hematology and Oncology | Admitting: Hematology and Oncology

## 2020-03-17 NOTE — Assessment & Plan Note (Deleted)
03/10/2020:Left lumpectomy [name]): IDC, grade 1, 1.2cm, with intermediate grade DCIS, clear margins, 5 sentinel lymph nodes negative for carcinoma.  ER 95%, PR 95%, Ki-67 10%, HER-2 negative T1c N0 stage Ia  Pathology counseling: I discussed the final pathology report of the patient provided  a copy of this report. I discussed the margins as well as lymph node surgeries. We also discussed the final staging along with previously performed ER/PR and HER-2/neu testing.  Treatment plan: 1. Oncotype DX testing to determine if chemotherapy would be of any benefit followed by 2. Adjuvant radiation therapy followed by 3. Adjuvant antiestrogen therapy  Difficulty with ambulation: Patient uses a wheelchair.  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  She does not have a lot of support. We discussed at length about the role of chemotherapy.  Given that she has a very favorable type of breast cancer, I decided to not order the Oncotype test.  I recommended that she proceed with radiation and I will see her back at the end of radiation to start antiestrogen therapy

## 2020-03-23 ENCOUNTER — Encounter: Payer: Self-pay | Admitting: *Deleted

## 2020-03-23 ENCOUNTER — Telehealth: Payer: Self-pay | Admitting: Hematology and Oncology

## 2020-03-23 ENCOUNTER — Telehealth: Payer: Self-pay | Admitting: *Deleted

## 2020-03-23 ENCOUNTER — Ambulatory Visit (INDEPENDENT_AMBULATORY_CARE_PROVIDER_SITE_OTHER): Payer: Medicare HMO | Admitting: Family Medicine

## 2020-03-23 DIAGNOSIS — Z170 Estrogen receptor positive status [ER+]: Secondary | ICD-10-CM | POA: Diagnosis not present

## 2020-03-23 DIAGNOSIS — C50412 Malignant neoplasm of upper-outer quadrant of left female breast: Secondary | ICD-10-CM | POA: Diagnosis not present

## 2020-03-23 LAB — SURGICAL PATHOLOGY

## 2020-03-23 NOTE — Telephone Encounter (Signed)
Called pt per 8/3 sch msg  - no answer. Left message for patient to call back to reschedule.

## 2020-03-23 NOTE — Telephone Encounter (Signed)
Received oncotype score of 1/3%. Physician team notified. Called pt with results and discussed chemo not recommended. Next step is xrt with Dr. Sondra Come. Informed will receive a call from Dr. Lois Huxley office with appt date and time.

## 2020-03-24 ENCOUNTER — Other Ambulatory Visit: Payer: Self-pay | Admitting: *Deleted

## 2020-03-24 DIAGNOSIS — C50412 Malignant neoplasm of upper-outer quadrant of left female breast: Secondary | ICD-10-CM

## 2020-03-25 ENCOUNTER — Encounter (HOSPITAL_COMMUNITY): Payer: Self-pay | Admitting: Hematology and Oncology

## 2020-03-25 ENCOUNTER — Encounter: Payer: Self-pay | Admitting: Hematology and Oncology

## 2020-03-27 NOTE — Progress Notes (Signed)
Patient Care Team: Mosetta Anis, MD as PCP - General Rockwell Germany, RN as Oncology Nurse Navigator Mauro Kaufmann, RN as Oncology Nurse Navigator Coralie Keens, MD as Consulting Physician (General Surgery) Nicholas Lose, MD as Consulting Physician (Hematology and Oncology) Gery Pray, MD as Consulting Physician (Radiation Oncology)  DIAGNOSIS:    ICD-10-CM   1. Malignant neoplasm of upper-outer quadrant of left breast in female, estrogen receptor positive ([place])  C50.412    Z17.0     SUMMARY OF ONCOLOGIC HISTORY: Oncology History  Malignant neoplasm of upper-outer quadrant of left breast in female, estrogen receptor positive ([place])  02/15/2020 Initial Diagnosis   Screening mammogram showed a left breast mass and calcifications. Diagnostic mammogram and US showed a 1.3cm mass at the 3 o'clock position in the left breast, no axillary adenopathy. Biopsy showed IDC with DCIS, grade 2, HER-2 - (1+), ER+ 95%, PR+ 95%, Ki67 10%.   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   03/10/2020 Cancer Staging   Staging form: Breast, AJCC 8th Edition - Pathologic stage from 03/10/2020: Stage IA (pT1b, pN0, cM0, G1, ER+, PR+, HER2-) - Signed by Gardenia Phlegm, NP on 03/23/2020   03/23/2020 Oncotype testing   Oncotype: score of 1 with a 3% chance of distant recurrence in 9 years on tamoxifen alone.     CHIEF COMPLIANT: Follow-up of left breast cancer s/p lumpectomy   INTERVAL HISTORY: Robin Hardin is a 69 y.o. with above-mentioned history of left breast cancer. She underwent a left lumpectomy with Dr. [name] on 03/10/20 for which pathology showed invasive ductal carcinoma, grade 1, 1.2cm, clear margins, 5 left sentinel lymph nodes negative for carcinoma. Oncotype testing showed a score of 1 with a 3% chance of distant recurrence in 9 years on tamoxifen alone.  She presents to the clinic today to discuss the pathology report and further treatment.  She uses a wheelchair for ambulation and is complaining of a seroma underneath the left arm.  She is not in much pain at this time and has stopped pain medications.  ALLERGIES:  is allergic to abilify [aripiprazole].  MEDICATIONS:  Current Outpatient Medications  Medication Sig Dispense Refill  . carvedilol (COREG) 25 MG tablet TAKE 1 TABLET TWICE DAILY WITH MEALS 180 tablet 1  . allopurinol (ZYLOPRIM) 300 MG tablet Take 1 tablet (300 mg total) by mouth daily. 90 tablet 3  . ALOE PO Take 1 capsule by mouth 2 (two) times daily.    Marland Kitchen aspirin 81 MG EC tablet Take 1 tablet (81 mg total) by mouth daily. (Patient not taking: Reported on 03/10/2020) 90 tablet 3  . Biotin w/ Vitamins C & E (HAIR SKIN & NAILS GUMMIES PO) Take 1 Dose by mouth daily.    . DULoxetine (CYMBALTA) 60 MG capsule Take 1 capsule (60 mg total) by mouth daily. 90 capsule 0  . furosemide (LASIX) 40 MG tablet Take 0.5 tablets (20 mg total) by mouth daily. 45 tablet 3  . Ginkgo Biloba 40 MG TABS Take 1 tablet by mouth daily.    . metFORMIN (GLUCOPHAGE) 500 MG tablet TAKE 1 TABLET TWICE DAILY WITH MEALS 180 tablet 1  . methocarbamol (ROBAXIN) 500 MG tablet Take 1 tablet (500 mg total) by mouth every 8 (eight) hours as needed for muscle spasms. 60 tablet 2  . Misc Natural Products (APPLE CIDER VINEGAR DIET PO) Take 1 tablet by  mouth daily.    Marland Kitchen OVER THE COUNTER MEDICATION Take 1 Dose by mouth daily. Fennel    . potassium chloride SA (KLOR-CON) 20 MEQ tablet Take 1 tablet (20 mEq total) by mouth daily. 90 tablet 3  . rosuvastatin (CRESTOR) 20 MG tablet Take 1 tablet (20 mg total) by mouth daily. 90 tablet 3  . sacubitril-valsartan (ENTRESTO) 49-51 MG Take 1 tablet by mouth 2 (two) times daily. 180 tablet 3  . spironolactone (ALDACTONE) 25 MG tablet Take 1 tablet (25 mg total) by mouth daily. 90 tablet 3  . traZODone (DESYREL) 100 MG tablet Take 1  tablet (100 mg total) by mouth at bedtime. 90 tablet 0  . Vitamin D, Ergocalciferol, (DRISDOL) 1.25 MG (50000 UNIT) CAPS capsule Take 1 capsule (50,000 Units total) by mouth every 7 (seven) days. (Patient not taking: Reported on 03/10/2020) 4 capsule 0   No current facility-administered medications for this visit.    PHYSICAL EXAMINATION: ECOG PERFORMANCE STATUS: 1 - Symptomatic but completely ambulatory  Vitals:   03/28/20 1240  BP: 136/80  Pulse: (!) 57  Resp: 18  Temp: (!) 97 F (36.1 C)  SpO2: 99%   Filed Weights   03/28/20 1240  Weight: 297 lb 9.6 oz (135 kg)    LABORATORY DATA:  I have reviewed the data as listed CMP Latest Ref Rng & Units 03/03/2020 01/04/2020 12/15/2019  Glucose 70 - 99 mg/dL 110(H) 89 86  BUN 8 - 23 mg/dL 35(H) 54(H) 31(H)  Creatinine 0.44 - 1.00 mg/dL 1.58(H) 1.76(H) 1.56(H)  Sodium 135 - 145 mmol/L 142 144 142  Potassium 3.5 - 5.1 mmol/L 4.1 5.0 4.3  Chloride 98 - 111 mmol/L 106 107(H) 104  CO2 22 - 32 mmol/L _0 Calcium 8.9 - 10.3 mg/dL 9.6 10.0 9.8  Total Protein 6.0 - 8.5 g/dL - - 7.1  Total Bilirubin 0.0 - 1.2 mg/dL - - 0.3  Alkaline Phos 39 - 117 IU/L - - 72  AST 0 - 40 IU/L - - 21  ALT 0 - 32 IU/L - - 23    Lab Results  Component Value Date   WBC 3.7 (L) 03/03/2020   HGB 10.8 (L) 03/03/2020   HCT 35.6 (L) 03/03/2020   MCV 101.1 (H) 03/03/2020   PLT 141 (L) 03/03/2020   NEUTROABS 1.3 (L) 01/04/2020    ASSESSMENT & PLAN:  Malignant neoplasm of upper-outer quadrant of left breast in female, estrogen receptor positive ([place]) 03/10/2020:Left lumpectomy [name]): IDC, grade 1, 1.2cm, with intermediate grade DCIS, clear margins, 5 sentinel lymph nodes negative for carcinoma.  ER 95%, PR 95%, Ki-67 10%, HER-2 negative, T1CN0 stage Ia  Pathology counseling: I discussed the final pathology report of the patient provided  a copy of this report. I discussed the margins as well as lymph node surgeries. We also discussed the final staging  along with previously performed ER/PR and HER-2/neu testing.  Oncotype DX score: 1: 3% chance of distant recurrence at 9 years  Treatment plan: 1. Adjuvant radiation therapy followed by 2. Adjuvant antiestrogen therapy  Seroma underneath the left arm: I sent a message to Dr. [name] to evaluate her for drainage. She has appointments for radiation therapy.  Return to clinic at the end of radiation to start antiestrogen therapy    No orders of the defined types were placed in this encounter.  The patient has a good understanding of the overall plan. she agrees with it. she will call with any problems that may develop  before the next visit here.  Total time spent: 30 mins including face to face time and time spent for planning, charting and coordination of care  Nicholas Lose, MD 03/28/2020  I, Cloyde Reams Dorshimer, am acting as scribe for Dr. Nicholas Lose.  I have reviewed the above documentation for accuracy and completeness, and I agree with the above.

## 2020-03-28 ENCOUNTER — Other Ambulatory Visit: Payer: Self-pay

## 2020-03-28 ENCOUNTER — Encounter: Payer: Self-pay | Admitting: *Deleted

## 2020-03-28 ENCOUNTER — Inpatient Hospital Stay: Payer: Medicare HMO | Attending: Hematology and Oncology | Admitting: Hematology and Oncology

## 2020-03-28 DIAGNOSIS — C50412 Malignant neoplasm of upper-outer quadrant of left female breast: Secondary | ICD-10-CM

## 2020-03-28 DIAGNOSIS — S40022A Contusion of left upper arm, initial encounter: Secondary | ICD-10-CM | POA: Insufficient documentation

## 2020-03-28 DIAGNOSIS — Z79899 Other long term (current) drug therapy: Secondary | ICD-10-CM | POA: Insufficient documentation

## 2020-03-28 DIAGNOSIS — Z170 Estrogen receptor positive status [ER+]: Secondary | ICD-10-CM

## 2020-03-28 DIAGNOSIS — Z79810 Long term (current) use of selective estrogen receptor modulators (SERMs): Secondary | ICD-10-CM | POA: Diagnosis not present

## 2020-03-28 NOTE — Assessment & Plan Note (Signed)
03/10/2020:Left lumpectomy [name]): IDC, grade 1, 1.2cm, with intermediate grade DCIS, clear margins, 5 sentinel lymph nodes negative for carcinoma.  ER 95%, PR 95%, Ki-67 10%, HER-2 negative, T1CN0 stage Ia  Pathology counseling: I discussed the final pathology report of the patient provided  a copy of this report. I discussed the margins as well as lymph node surgeries. We also discussed the final staging along with previously performed ER/PR and HER-2/neu testing.  Oncotype DX score: 1: 3% chance of distant recurrence at 9 years  Treatment plan: 1. Adjuvant radiation therapy followed by 2. Adjuvant antiestrogen therapy  Return to clinic at the end of radiation to start antiestrogen therapy

## 2020-03-29 ENCOUNTER — Telehealth: Payer: Self-pay | Admitting: Hematology and Oncology

## 2020-03-29 NOTE — Telephone Encounter (Signed)
No 8/9 los, no changes made to pt schedule

## 2020-04-04 ENCOUNTER — Other Ambulatory Visit: Payer: Self-pay

## 2020-04-04 ENCOUNTER — Ambulatory Visit: Payer: Medicare HMO | Attending: Surgery | Admitting: Physical Therapy

## 2020-04-04 ENCOUNTER — Encounter: Payer: Self-pay | Admitting: Physical Therapy

## 2020-04-04 ENCOUNTER — Encounter: Payer: Self-pay | Admitting: Licensed Clinical Social Worker

## 2020-04-04 DIAGNOSIS — R600 Localized edema: Secondary | ICD-10-CM | POA: Diagnosis not present

## 2020-04-04 DIAGNOSIS — R293 Abnormal posture: Secondary | ICD-10-CM | POA: Insufficient documentation

## 2020-04-04 DIAGNOSIS — R262 Difficulty in walking, not elsewhere classified: Secondary | ICD-10-CM | POA: Diagnosis not present

## 2020-04-04 DIAGNOSIS — C50412 Malignant neoplasm of upper-outer quadrant of left female breast: Secondary | ICD-10-CM | POA: Diagnosis not present

## 2020-04-04 DIAGNOSIS — Z483 Aftercare following surgery for neoplasm: Secondary | ICD-10-CM | POA: Insufficient documentation

## 2020-04-04 DIAGNOSIS — Z170 Estrogen receptor positive status [ER+]: Secondary | ICD-10-CM | POA: Insufficient documentation

## 2020-04-04 NOTE — Patient Instructions (Signed)
            [facility]         [address],  [address]         [phone]         Annia Friendly, PT, CLT   After Breast Cancer Class It is recommended you attend the ABC class to be educated on lymphedema risk reduction. This class is free of charge and lasts for 1 hour. It is a 1-time class. You are scheduled for September 20th at 11:00.    Scar massage It's ok to begin gentle massage on your scars. We recommend using coconut oil or vitamin E cream.   Compression garment Wearing a good sports bra that comes up high into your armpit will help with your swelling.   Home exercise Program Begin your home exercises to improve your shoulder range of motion.   Follow up PT: It is recommended you come to PT a few times so we can work on your armpit swelling and shoulder tightness.

## 2020-04-04 NOTE — Therapy (Signed)
Robin Hardin, [place], [id number] Phone: [phone]   Fax:  [phone]  Physical Therapy Evaluation  Patient Details  Name: Robin Hardin MRN: [id number] Date of Birth: 1950/11/23 Referring Provider (PT): Dr. Coralie Keens   Encounter Date: 04/04/2020   PT End of Session - 04/04/20 1345    Visit Number 2    Number of Visits 10    Date for PT Re-Evaluation 05/02/20    PT Start Time 1050    PT Stop Time 1135    PT Time Calculation (min) 45 min    Activity Tolerance Patient tolerated treatment well    Behavior During Therapy [facility] for tasks assessed/performed           Past Medical History:  Diagnosis Date  . Acute respiratory failure with hypoxia ([place]) 09/26/2012  . Anxiety   . Arthritis   . Back pain   . Bilateral knee pain   . Cellulitis of lower leg    left  . Chronic combined systolic and diastolic congestive heart failure ([place]) 10/10/2012   2 D echo 09/2012:  Left ventricle: Extremely poor acoustic windows limit study. Overall LVEF appears to be severely depressed. Recomm ordering limited study with contrast to further evaluate LV systolic function. The cavity size was normal. Wall thickness was normal. Doppler parameters are consistent with abnormal left ventricular relaxation (grade 1 diastolic dysfunction).   2 D echo 03/2009 :  Left ventricle: The cavity size was normal. There was mild concentric hypertrophy. Systolic function was mildly reduced. The estimated ejection fraction was in the range of 45% to 50%. Diffuse hypokinesis. Doppler parameters are consistent with abnormal left ventricular relaxation (grade 1 diastolic dysfunction).     . Chronic kidney disease 10/10/2012   Stage 2 with GFR of 64   . Constipation   . Depression   . Edema, lower extremity   . Gout   . H/O: hysterectomy   . History of heart attack   . Hyperlipidemia   . Hypertension   . Joint pain   . Malignant  hypertension 09/26/2012  . Morbid obesity ([place])   . Myocardial infarction ([place])   . Nonischemic cardiomyopathy ([place])   . Shortness of breath   . Sleep apnea    reported intolerance to CPAP (03/04/20)  . Unspecified essential hypertension 03/22/2009   Qualifier: Diagnosis of  By: Karrie Meres RN, BSN, [name]    . Vitamin D deficiency     Past Surgical History:  Procedure Laterality Date  . BREAST LUMPECTOMY WITH RADIOACTIVE SEED AND SENTINEL LYMPH NODE BIOPSY Left 03/10/2020   Procedure: LEFT BREAST LUMPECTOMY WITH RADIOACTIVE SEED AND SENTINEL LYMPH NODE BIOPSY;  Surgeon: Coralie Keens, MD;  Location: [facility];  Service: General;  Laterality: Left;  . fibroid tumor removal  2004    There were no vitals filed for this visit.    Subjective Assessment - 04/04/20 1057    Subjective Patient reports she underwent a left lumpectomy on 03/10/2020 with a sentinel node biopsy (5 negative nodes removed). Her Oncotype score was 1 so no chemo is needed. She will begin radiation soon. She has not seen her surgeon for a f/u visit but is scheduled to see him on 04/12/2020. She is going to [facility] and reports she has lost 27 pounds since she began earlier this year.    Pertinent History Patient was diagnosed on 01/21/2020 with left grade II invasive ductal carcinoma breast cancer. Patient reports she  underwent a left lumpectomy on 03/10/2020 with a sentinel node biopsy (5 negative nodes removed). It is ER/PR positive and HER2 negative with a Ki67 of 10%. Patient is morbidly obese and has limitations with ambulation.    Patient Stated Goals See if my arm is doing ok    Currently in Pain? No/denies              [facility] PT Assessment - 04/04/20 0001      Assessment   Medical Diagnosis s/p left lumpectomy and SLNB    Referring Provider (PT) Dr. Coralie Keens    Onset Date/Surgical Date 03/10/20    Hand Dominance Left    Prior Therapy Baselines      Precautions   Precautions Other  (comment);Fall   Recent surgery     Restrictions   Weight Bearing Restrictions No      Home Environment   Living Environment Private residence    Living Arrangements Spouse/significant other    Available Help at Discharge Family      Prior Function   Level of [place] device for independence    Leisure She does not currently exercise      Cognition   Overall Cognitive Status Within Functional Limits for tasks assessed      Observation/Other Assessments   Observations Left axillary and breast incisions are both healing well. She appears to have a fairly large seroma in her left axilla which is somewhat tender.      Posture/Postural Control   Posture/Postural Control Postural limitations    Postural Limitations Rounded Shoulders;Forward head      ROM / Strength   AROM / PROM / Strength AROM      AROM   AROM Assessment Site Shoulder    Right/Left Shoulder Left    Left Shoulder Extension 40 Degrees    Left Shoulder Flexion 106 Degrees    Left Shoulder ABduction 107 Degrees             LYMPHEDEMA/ONCOLOGY QUESTIONNAIRE - 04/04/20 0001      Type   Cancer Type Left breast cancer      Surgeries   Lumpectomy Date 03/10/20    Sentinel Lymph Node Biopsy Date 03/10/20    Number Lymph Nodes Removed 5      Treatment   Active Chemotherapy Treatment No    Past Chemotherapy Treatment No    Active Radiation Treatment No    Past Radiation Treatment No    Current Hormone Treatment No    Past Hormone Therapy No      What other symptoms do you have   Are you Having Heaviness or Tightness No    Are you having Pain No    Are you having pitting edema No    Is it Hard or Difficult finding clothes that fit No    Do you have infections No    Is there Decreased scar mobility No    [place] Sign No      Lymphedema Assessments   Lymphedema Assessments Upper extremities      Right Upper Extremity Lymphedema   10 cm Proximal to Olecranon Process 48 cm     Olecranon Process 29.1 cm    10 cm Proximal to Ulnar Styloid Process 25.3 cm    Just Proximal to Ulnar Styloid Process 18.7 cm    Across Hand at [employer] 20.2 cm    At [place] of 2nd Digit 6.1 cm      Left Upper Extremity  Lymphedema   10 cm Proximal to Olecranon Process 49 cm   Left upper arm with additional lobule compared to right   Olecranon Process 28.6 cm    10 cm Proximal to Ulnar Styloid Process 24.9 cm    Just Proximal to Ulnar Styloid Process 18.4 cm    Across Hand at [employer] 19.4 cm    At [place] of 2nd Digit 6 cm                 Quick Dash - 04/04/20 0001    Open a tight or new jar Severe difficulty    Do heavy household chores (wash walls, wash floors) Unable    Carry a shopping bag or briefcase Unable    Wash your back Unable    Use a knife to cut food Moderate difficulty    Recreational activities in which you take some force or impact through your arm, shoulder, or hand (golf, hammering, tennis) Unable    During the past week, to what extent has your arm, shoulder or hand problem interfered with your normal social activities with family, friends, neighbors, or groups? Modererately    During the past week, to what extent has your arm, shoulder or hand problem limited your work or other regular daily activities Quite a bit    Arm, shoulder, or hand pain. Moderate    Tingling (pins and needles) in your arm, shoulder, or hand None    Difficulty Sleeping Severe difficulty    DASH Score 70.45 %            Objective measurements completed on examination: See above findings.               PT Education - 04/04/20 1253    Education Details Lymphedema risk reduction, scar massage, follow up care. HEP    Person(s) Educated Patient    Methods Explanation;Demonstration;Handout    Comprehension Returned demonstration;Verbalized understanding               PT Long Term Goals - 04/04/20 1359      PT LONG TERM GOAL #1   Title Patient will  demonstrate she has returned to baseline with shoulder ROM and function post operatively compared to baseline measurements.    Time 4    Period Weeks    Status On-going    Target Date 05/02/20      PT LONG TERM GOAL #2   Title Patient will report >/= 40% reduction in left axillary edema for improved comfort with daily tasks.    Time 4    Period Weeks    Status New    Target Date 05/02/20      PT LONG TERM GOAL #3   Title Patient will improve her DASH score to be </= 45.45 for improved overall upper extremity function.    Baseline 70.45 post op; 45.45 pre-op    Time 4    Period Weeks    Status New    Target Date 05/02/20      PT LONG TERM GOAL #4   Title Patient will be educated on lymphedema risk reduction strategies.    Time 4    Period Weeks    Status New    Target Date 05/02/20                  Plan - 04/04/20 1355    Clinical Impression Statement Patient is doing very well s/p left lumpectomy and SLNB. She appears to have  a large seroma preesnt in hre left axilla which may benefit from manual lymph drainage to that area. She has regained full shoulder ROM compared to baseline measurements. She is able to ambulate independently but has difficulty with anything other than very short distances. She plans to come to PT in her motorized wheelchair but can walk very short distances and transfer indpendently. She will benefit from PT to address her edema in the left axilla and also for education on exercises she may tolerate for improved overall health and to maintain arm function and reduce lymphedema.    PT Frequency 2x / week    PT Duration 4 weeks    PT Treatment/Interventions ADLs/Self Care Home Management;Therapeutic exercise;Patient/family education;Manual techniques;Manual lymph drainage    PT Next Visit Plan Begin manual lymph drainage left axilla and shoulder ROM exercises to maintain and possibly improve ROM    PT Home Exercise Plan Post op shoulder ROM HEP     Consulted and Agree with Plan of Care Patient           Patient will benefit from skilled therapeutic intervention in order to improve the following deficits and impairments:  Decreased knowledge of precautions, Impaired UE functional use, Pain, Postural dysfunction, Decreased range of motion, Increased edema  Visit Diagnosis: Malignant neoplasm of upper-outer quadrant of left breast in female, estrogen receptor positive ([place]) - Plan: PT plan of care cert/re-cert  Abnormal posture - Plan: PT plan of care cert/re-cert  Difficulty in walking, not elsewhere classified - Plan: PT plan of care cert/re-cert  Aftercare following surgery for neoplasm - Plan: PT plan of care cert/re-cert  Localized edema - Plan: PT plan of care cert/re-cert     Problem List Patient Active Problem List   Diagnosis Date Noted  . History of left breast cancer 03/10/2020  . Malignant neoplasm of upper-outer quadrant of left breast in female, estrogen receptor positive ([place]) 02/15/2020  . Drug-induced leukopenia ([place]) 01/04/2020  . Low back pain 11/10/2019  . Breast cancer screening by mammogram 05/13/2018  . Mobility impaired 02/04/2018  . Vitamin D deficiency 05/21/2017  . Normocytic anemia 12/28/2014  . History of diabetes mellitus 07/27/2014  . Health care maintenance 08/25/2013  . Depression 03/03/2013  . Chronic combined systolic and diastolic congestive heart failure ([place]) 10/10/2012  . CKD (chronic kidney disease) stage 3, GFR 30-59 ml/min 10/10/2012  . Gout 10/09/2012  . Hyperlipidemia 05/31/2009  . Obstructive sleep apnea 04/07/2009  . Morbid obesity with BMI of 50.0-59.9, adult ([place]) 03/22/2009  . Essential hypertension 03/22/2009   Annia Friendly, PT 04/04/20 2:02 PM  [place], [place], [address] Phone: [phone]   Fax:  [phone]  Name: Robin Hardin MRN: [id number] Date of Birth:  08-28-1950

## 2020-04-04 NOTE — Progress Notes (Signed)
[facility] CSW Progress Note  Clinical Education officer, museum contacted patient by phone to determine transportation needs after receiving request from PT and Engineer, site. CSW spoke with patient by phone. She reports now using a power wheelchair. She has a friend that helps with some rides, but cannot do frequently. CSW will refer to [facility] transportation for help with radiation appts. Encouraged patient to also call her insurance to determine if she has transportation benefit.  Patient voiced understanding of the above.    Edwinna Areola Keeara Frees , LCSW

## 2020-04-05 ENCOUNTER — Telehealth: Payer: Self-pay | Admitting: Internal Medicine

## 2020-04-05 NOTE — Telephone Encounter (Signed)
Robin Hardin DOB: 10-05-1950 MRN: [id number]   RIDER WAIVER AND RELEASE OF LIABILITY  For purposes of improving physical access to our facilities, [facility] is pleased to partner with third parties to provide [facility] patients or other authorized individuals the option of convenient, on-demand ground transportation services (the Technical brewer") through use of the technology service that enables users to request on-demand ground transportation from independent third-party providers.  By opting to use and accept these [employer], I, the undersigned, hereby agree on behalf of myself, and on behalf of any minor child using the [employer] for whom I am the parent or legal guardian, as follows:  1. Government social research officer provided to me are provided by independent third-party transportation providers who are not [employer] or employees and who are unaffiliated with [employer]. 2. [place] is neither a transportation carrier nor a common or public carrier. 3. [facility] has no control over the quality or safety of the transportation that occurs as a result of the [employer]. 4. [facility] cannot guarantee that any third-party transportation provider will complete any arranged transportation service. 5. [facility] makes no representation, warranty, or guarantee regarding the reliability, timeliness, quality, safety, suitability, or availability of any of the Transport Services or that they will be error free. 6. I fully understand that traveling by vehicle involves risks and dangers of serious bodily injury, including permanent disability, paralysis, and death. I agree, on behalf of myself and on behalf of any minor child using the Transport Services for whom I am the parent or legal guardian, that the entire risk arising out of my use of the [employer] remains solely with me, to the maximum extent permitted under applicable law. 7. The [employer] are provided "as is" and "as available." [facility] disclaims all representations and warranties, express, implied or statutory, not expressly set out in these terms, including the implied warranties of merchantability and fitness for a particular purpose. 8. I hereby waive and release [facility], its agents, employees, officers, directors, representatives, insurers, attorneys, assigns, successors, subsidiaries, and affiliates from any and all past, present, or future claims, demands, liabilities, actions, causes of action, or suits of any kind directly or indirectly arising from acceptance and use of the [employer]. 9. I further waive and release [facility] and its affiliates from all present and future liability and responsibility for any injury or death to persons or damages to property caused by or related to the use of the [employer]. 10. I have read this Waiver and Release of Liability, and I understand the terms used in it and their legal significance. This Waiver is freely and voluntarily given with the understanding that my right (as well as the right of any minor child for whom I am the parent or legal guardian using the [employer]) to legal recourse against  in connection with the [employer] is knowingly surrendered in return for use of these services.   I attest that I read the consent document to Robin Hardin, gave Robin Hardin the opportunity to ask questions and answered the questions asked (if any). I affirm that Robin Hardin then provided consent for she's participation in this program.     Robin Hardin

## 2020-04-06 ENCOUNTER — Other Ambulatory Visit: Payer: Self-pay

## 2020-04-06 ENCOUNTER — Encounter (INDEPENDENT_AMBULATORY_CARE_PROVIDER_SITE_OTHER): Payer: Self-pay | Admitting: Bariatrics

## 2020-04-06 ENCOUNTER — Ambulatory Visit (INDEPENDENT_AMBULATORY_CARE_PROVIDER_SITE_OTHER): Payer: Medicare HMO | Admitting: Bariatrics

## 2020-04-06 ENCOUNTER — Other Ambulatory Visit: Payer: Self-pay | Admitting: Internal Medicine

## 2020-04-06 ENCOUNTER — Ambulatory Visit: Payer: Medicare HMO | Admitting: Physical Therapy

## 2020-04-06 ENCOUNTER — Encounter: Payer: Self-pay | Admitting: Physical Therapy

## 2020-04-06 VITALS — BP 115/76 | HR 60 | Temp 98.3°F | Ht 63.0 in | Wt 290.0 lb

## 2020-04-06 DIAGNOSIS — Z483 Aftercare following surgery for neoplasm: Secondary | ICD-10-CM | POA: Diagnosis not present

## 2020-04-06 DIAGNOSIS — Z6841 Body Mass Index (BMI) 40.0 and over, adult: Secondary | ICD-10-CM

## 2020-04-06 DIAGNOSIS — R6889 Other general symptoms and signs: Secondary | ICD-10-CM | POA: Diagnosis not present

## 2020-04-06 DIAGNOSIS — E7849 Other hyperlipidemia: Secondary | ICD-10-CM | POA: Diagnosis not present

## 2020-04-06 DIAGNOSIS — I5042 Chronic combined systolic (congestive) and diastolic (congestive) heart failure: Secondary | ICD-10-CM

## 2020-04-06 DIAGNOSIS — C50412 Malignant neoplasm of upper-outer quadrant of left female breast: Secondary | ICD-10-CM | POA: Diagnosis not present

## 2020-04-06 DIAGNOSIS — Z170 Estrogen receptor positive status [ER+]: Secondary | ICD-10-CM | POA: Diagnosis not present

## 2020-04-06 DIAGNOSIS — R262 Difficulty in walking, not elsewhere classified: Secondary | ICD-10-CM | POA: Diagnosis not present

## 2020-04-06 DIAGNOSIS — R293 Abnormal posture: Secondary | ICD-10-CM | POA: Diagnosis not present

## 2020-04-06 DIAGNOSIS — R600 Localized edema: Secondary | ICD-10-CM

## 2020-04-06 MED ORDER — FUROSEMIDE 40 MG PO TABS: 20.0000 mg | ORAL_TABLET | Freq: Every day | ORAL | 0 refills | Status: DC

## 2020-04-06 NOTE — Patient Instructions (Signed)
SHOULDER: Flexion - Supine (Cane)        Cancer Rehab [phone]    Hold cane in both hands. Raise arms up overhead. Do not allow back to arch. Hold _5__ seconds. Do __5-10__ times; __1-2__ times a day.   SELF ASSISTED WITH OBJECT: Shoulder Abduction / Adduction - Supine    Hold cane with both hands. Move both arms from side to side, keep elbows straight.  Hold when stretch felt for __5__ seconds. Repeat __5-10__ times; __1-2__ times a day. Once this becomes easier progress to third picture bringing affected arm towards ear by staying out to side. Same hold for _5_seconds. Repeat  _5-10_ times, _1-2_ times/day.  Shoulder Blade Stretch    Clasp fingers behind head with elbows touching in front of face. Pull elbows back while pressing shoulder blades together. Relax and hold as tolerated, can place pillow under elbow here for comfort as needed and to allow for prolonged stretch.  Repeat __5__ times. Do __1-2__ sessions per day.

## 2020-04-06 NOTE — Therapy (Signed)
[place], [place], [address] Phone: [phone]   Fax:  [phone]  Physical Therapy Treatment  Patient Details  Name: Robin Hardin MRN: [id number] Date of Birth: 1951-08-20 Referring Provider (PT): Dr. Coralie Keens   Encounter Date: 04/06/2020   PT End of Session - 04/06/20 1356    Visit Number 3    Number of Visits 10    Date for PT Re-Evaluation 05/02/20    PT Start Time 3149    PT Stop Time 1345    PT Time Calculation (min) 40 min    Activity Tolerance Patient tolerated treatment well    Behavior During Therapy [facility] for tasks assessed/performed           Past Medical History:  Diagnosis Date  . Acute respiratory failure with hypoxia ([place]) 09/26/2012  . Anxiety   . Arthritis   . Back pain   . Bilateral knee pain   . Cellulitis of lower leg    left  . Chronic combined systolic and diastolic congestive heart failure ([place]) 10/10/2012   2 D echo 09/2012:  Left ventricle: Extremely poor acoustic windows limit study. Overall LVEF appears to be severely depressed. Recomm ordering limited study with contrast to further evaluate LV systolic function. The cavity size was normal. Wall thickness was normal. Doppler parameters are consistent with abnormal left ventricular relaxation (grade 1 diastolic dysfunction).   2 D echo 03/2009 :  Left ventricle: The cavity size was normal. There was mild concentric hypertrophy. Systolic function was mildly reduced. The estimated ejection fraction was in the range of 45% to 50%. Diffuse hypokinesis. Doppler parameters are consistent with abnormal left ventricular relaxation (grade 1 diastolic dysfunction).     . Chronic kidney disease 10/10/2012   Stage 2 with GFR of 64   . Constipation   . Depression   . Edema, lower extremity   . Gout   . H/O: hysterectomy   . History of heart attack   . Hyperlipidemia   . Hypertension   . Joint pain   . Malignant  hypertension 09/26/2012  . Morbid obesity ([place])   . Myocardial infarction ([place])   . Nonischemic cardiomyopathy ([place])   . Shortness of breath   . Sleep apnea    reported intolerance to CPAP (03/04/20)  . Unspecified essential hypertension 03/22/2009   Qualifier: Diagnosis of  By: Karrie Meres RN, BSN, [name]    . Vitamin D deficiency     Past Surgical History:  Procedure Laterality Date  . BREAST LUMPECTOMY WITH RADIOACTIVE SEED AND SENTINEL LYMPH NODE BIOPSY Left 03/10/2020   Procedure: LEFT BREAST LUMPECTOMY WITH RADIOACTIVE SEED AND SENTINEL LYMPH NODE BIOPSY;  Surgeon: Coralie Keens, MD;  Location: [facility];  Service: General;  Laterality: Left;  . fibroid tumor removal  2004    There were no vitals filed for this visit.   Subjective Assessment - 04/06/20 1351    Currently in Pain? Yes    Pain Score 2     Pain Location Axilla    Pain Orientation Left    Pain Descriptors / Indicators Nagging    Pain Type Chronic pain    Pain Onset More than a month ago    Pain Frequency Intermittent    Aggravating Factors  reaching overhead    Pain Relieving Factors rest  [facility] Adult PT Treatment/Exercise - 04/06/20 0001      Exercises   Exercises Shoulder      Shoulder Exercises: Supine   Protraction AAROM;Both;5 reps   with dowel    Flexion AAROM;Right;Left;10 reps   with dowel    ABduction AAROM;Left;5 reps   with dowel    Other Supine Exercises butterfly stretch      Manual Therapy   Manual Therapy Edema management;Manual Lymphatic Drainage (MLD);Passive ROM    Edema Management provded small dotted foam patch to wear inside bra at fullness in left breat near incision     Manual Lymphatic Drainage (MLD) instructed in basics of lymphatic system., short neck, 5 deep breaths, right axillary nodes, anterior interaxillary anastamosis, anterior chest and breast, left inguinal nodes, left axillo-inguinal anastamosis, lext axilla, upper arm to lower arm  and return along pathways     Passive ROM to left shoulder in limits of ROM                   PT Education - 04/06/20 1355    Education Details supine dowel ROM exercises    Person(s) Educated Patient    Methods Explanation;Handout    Comprehension Verbalized understanding;Returned demonstration               PT Long Term Goals - 04/04/20 1359      PT LONG TERM GOAL #1   Title Patient will demonstrate she has returned to baseline with shoulder ROM and function post operatively compared to baseline measurements.    Time 4    Period Weeks    Status On-going    Target Date 05/02/20      PT LONG TERM GOAL #2   Title Patient will report >/= 40% reduction in left axillary edema for improved comfort with daily tasks.    Time 4    Period Weeks    Status New    Target Date 05/02/20      PT LONG TERM GOAL #3   Title Patient will improve her DASH score to be </= 45.45 for improved overall upper extremity function.    Baseline 70.45 post op; 45.45 pre-op    Time 4    Period Weeks    Status New    Target Date 05/02/20      PT LONG TERM GOAL #4   Title Patient will be educated on lymphedema risk reduction strategies.    Time 4    Period Weeks    Status New    Target Date 05/02/20                 Plan - 04/06/20 1356    Clinical Impression Statement First session of supine dowel stretches and PROM to shoulder with introduction to MLD with beginning education on Self MLD She continues to have palpable fullness at incision on breast, axilla and left upper medial arm to just elbow is visibly fuller than right arm  Reinforced positioning that pt will need to get into for radiation    PT Frequency 2x / week    PT Duration 4 weeks    PT Treatment/Interventions ADLs/Self Care Home Management;Therapeutic exercise;Patient/family education;Manual techniques;Manual lymph drainage    PT Next Visit Plan continue  manual lymph drainage left axilla and arm with attention to  left upper arm and elbow  cont shoulder ROM exercises to maintain and possibly improve ROM    PT Home Exercise Plan Post op shoulder ROM HEP    Consulted  and Agree with Plan of Care Patient           Patient will benefit from skilled therapeutic intervention in order to improve the following deficits and impairments:  Decreased knowledge of precautions, Impaired UE functional use, Pain, Postural dysfunction, Decreased range of motion, Increased edema  Visit Diagnosis: Abnormal posture  Aftercare following surgery for neoplasm  Localized edema     Problem List Patient Active Problem List   Diagnosis Date Noted  . History of left breast cancer 03/10/2020  . Malignant neoplasm of upper-outer quadrant of left breast in female, estrogen receptor positive ([place]) 02/15/2020  . Drug-induced leukopenia ([place]) 01/04/2020  . Low back pain 11/10/2019  . Breast cancer screening by mammogram 05/13/2018  . Mobility impaired 02/04/2018  . Vitamin D deficiency 05/21/2017  . Normocytic anemia 12/28/2014  . History of diabetes mellitus 07/27/2014  . Health care maintenance 08/25/2013  . Depression 03/03/2013  . Chronic combined systolic and diastolic congestive heart failure ([place]) 10/10/2012  . CKD (chronic kidney disease) stage 3, GFR 30-59 ml/min 10/10/2012  . Gout 10/09/2012  . Hyperlipidemia 05/31/2009  . Obstructive sleep apnea 04/07/2009  . Morbid obesity with BMI of 50.0-59.9, adult ([place]) 03/22/2009  . Essential hypertension 03/22/2009   Donato Heinz. Owens Shark PT  Norwood Levo 04/06/2020, 1:59 PM  [place], [place], [address] Phone: [phone]   Fax:  [phone]  Name: KAIANA MARION MRN: [id number] Date of Birth: 1951/02/19

## 2020-04-07 ENCOUNTER — Encounter (INDEPENDENT_AMBULATORY_CARE_PROVIDER_SITE_OTHER): Payer: Self-pay | Admitting: Bariatrics

## 2020-04-07 NOTE — Progress Notes (Signed)
Chief Complaint:   OBESITY Robin Hardin is here to discuss her progress with her obesity treatment plan along with follow-up of her obesity related diagnoses. [name] is on the [place] and states she is following her eating plan approximately 80% of the time. Robin Hardin states she is exercising 0 minutes 0 times per week.  Today's visit was #: 5 Starting weight: 301 lbs Starting date: 12/15/2019 Today's weight: 290 lbs Today's date: 04/06/2020 Total lbs lost to date: 11 Total lbs lost since last in-office visit: 3  Interim History: Robin Hardin is down 3 lbs and doing well overall. She had surgery (breast procedure for cancer) and is seeing Heme/Onc. She reports getting adequate protein and water intake.  Subjective:   Chronic combined systolic and diastolic congestive heart failure ([place]). Robin Hardin is taking Lasix, which helps with her fluid. She is also taking Entresto and Aldactone.  Other hyperlipidemia. Robin Hardin is taking Crestor.  Lab Results  Component Value Date   CHOL 196 12/15/2019   HDL 68 12/15/2019   LDLCALC 114 (H) 12/15/2019   LDLDIRECT 160.6 06/14/2009   TRIG 78 12/15/2019   CHOLHDL 2.9 12/15/2019   Lab Results  Component Value Date   ALT 23 12/15/2019   AST 21 12/15/2019   ALKPHOS 72 12/15/2019   BILITOT 0.3 12/15/2019   The ASCVD Risk score Robin Bussing DC Jr., et al., 2013) failed to calculate for the following reasons:   The patient has a prior MI or stroke diagnosis  Assessment/Plan:   Chronic combined systolic and diastolic congestive heart failure ([place]). Prescription was given for furosemide (LASIX) 40 MG tablet 0.5 tablet (20 mg total) PO #15 with 0 refills.  Other hyperlipidemia. Cardiovascular risk and specific lipid/LDL goals reviewed.  We discussed several lifestyle modifications today and Robin Hardin will continue to work on diet, exercise and weight loss efforts. Orders and follow up as documented in patient record. She is to continue Crestor  as directed.   Counseling Intensive lifestyle modifications are the first line treatment for this issue. . Dietary changes: Increase soluble fiber. Decrease simple carbohydrates. . Exercise changes: Moderate to vigorous-intensity aerobic activity 150 minutes per week if tolerated. . Lipid-lowering medications: see documented in medical record.  Class 3 severe obesity with serious comorbidity and body mass index (BMI) of 50.0 to 59.9 in adult, unspecified obesity type ([place]).  Robin Hardin is currently in the action stage of change. As such, her goal is to continue with weight loss efforts. She has agreed to the [place] alternating with the [place].  She will work on meal planning, intentional eating, and increasing her protein intake.    Exercise goals: Older adults should follow the adult guidelines. When older adults cannot meet the adult guidelines, they should be as physically active as their abilities and conditions will allow.   Behavioral modification strategies: increasing lean protein intake, decreasing simple carbohydrates, increasing vegetables, increasing water intake, decreasing eating out, no skipping meals, meal planning and cooking strategies, keeping healthy foods in the home and planning for success.  Robin Hardin has agreed to follow-up with our clinic in 2-3 weeks. She was informed of the importance of frequent follow-up visits to maximize her success with intensive lifestyle modifications for her multiple health conditions.   Objective:   Blood pressure 115/76, pulse 60, temperature 98.3 F (36.8 C), height 5' 3" (1.6 m), weight 290 lb (131.5 kg), SpO2 100 %. Body mass index is 51.37 kg/m.  General: Cooperative, alert, well developed, in no acute  distress. HEENT: Conjunctivae and lids unremarkable. Cardiovascular: Regular rhythm.  Lungs: Normal work of breathing. Neurologic: No focal deficits.   Lab Results  Component Value Date   CREATININE 1.58 (H)  03/03/2020   BUN 35 (H) 03/03/2020   NA 142 03/03/2020   K 4.1 03/03/2020   CL 106 03/03/2020   CO2 26 03/03/2020   Lab Results  Component Value Date   ALT 23 12/15/2019   AST 21 12/15/2019   ALKPHOS 72 12/15/2019   BILITOT 0.3 12/15/2019   Lab Results  Component Value Date   HGBA1C 5.5 03/10/2020   HGBA1C 5.3 12/15/2019   HGBA1C 5.3 07/29/2018   HGBA1C 5.5 11/08/2015   HGBA1C 5.1 03/31/2015   Lab Results  Component Value Date   INSULIN 6.9 12/15/2019   Lab Results  Component Value Date   TSH 1.500 12/15/2019   Lab Results  Component Value Date   CHOL 196 12/15/2019   HDL 68 12/15/2019   LDLCALC 114 (H) 12/15/2019   LDLDIRECT 160.6 06/14/2009   TRIG 78 12/15/2019   CHOLHDL 2.9 12/15/2019   Lab Results  Component Value Date   WBC 3.7 (L) 03/03/2020   HGB 10.8 (L) 03/03/2020   HCT 35.6 (L) 03/03/2020   MCV 101.1 (H) 03/03/2020   PLT 141 (L) 03/03/2020   Lab Results  Component Value Date   IRON 84 12/15/2019   TIBC 354 12/15/2019   FERRITIN 48 12/15/2019   Obesity Behavioral Intervention Documentation for Insurance:   Approximately 15 minutes were spent on the discussion below.  ASK: We discussed the diagnosis of obesity with Robin Hardin today and Robin Hardin agreed to give [place] permission to discuss obesity behavioral modification therapy today.  ASSESS: Robin Hardin has the diagnosis of obesity and her BMI today is 51.5. Robin Hardin is in the action stage of change.   ADVISE: Robin Hardin was educated on the multiple health risks of obesity as well as the benefit of weight loss to improve her health. She was advised of the need for long term treatment and the importance of lifestyle modifications to improve her current health and to decrease her risk of future health problems.  AGREE: Multiple dietary modification options and treatment options were discussed and Robin Hardin agreed to follow the recommendations documented in the above note.  ARRANGE: Robin Hardin was educated on  the importance of frequent visits to treat obesity as outlined per CMS and USPSTF guidelines and agreed to schedule her next follow up appointment today.  Attestation Statements:   Reviewed by clinician on day of visit: allergies, medications, problem list, medical history, surgical history, family history, social history, and previous encounter notes.  Migdalia Dk, am acting as Location manager for [employer], DO   I have reviewed the above documentation for accuracy and completeness, and I agree with the above. Jearld Lesch, DO

## 2020-04-08 NOTE — Progress Notes (Signed)
DIAGNOSIS:    ICD-10-CM   1. Malignant neoplasm of upper-outer quadrant of left breast in female, estrogen receptor positive ([place])  C50.412    Z17.0     SUMMARY OF ONCOLOGIC HISTORY:    Oncology History  Malignant neoplasm of upper-outer quadrant of left breast in female, estrogen receptor positive ([place])  02/15/2020 Initial Diagnosis   Screening mammogram showed a left breast mass and calcifications. Diagnostic mammogram and US showed a 1.3cm mass at the 3 o'clock position in the left breast, no axillary adenopathy. Biopsy showed IDC with DCIS, grade 2, HER-2 - (1+), ER+ 95%, PR+ 95%, Ki67 10%.   03/10/2020 Surgery   Left lumpectomy Robin Hardin): IDC, grade 1, 1.2cm, with intermediate grade DCIS, clear margins, 5 sentinel lymph nodes negative for carcinoma.   03/10/2020 Cancer Staging   Staging form: Breast, AJCC 8th Edition - Pathologic stage from 03/10/2020: Stage IA (pT1b, pN0, cM0, G1, ER+, PR+, HER2-) - Signed by Gardenia Phlegm, NP on 03/23/2020   03/23/2020 Oncotype testing   Oncotype: score of 1 with a 3% chance of distant recurrence in 9 years on tamoxifen alone.     CHIEF COMPLIANT: Follow-up of left breast cancer s/p lumpectomy   INTERVAL HISTORY: Robin Hardin is a 69 y.o. with above-mentioned history of left breast cancer. She underwent a left lumpectomy with Dr. 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 Oncotype testing showed a score of 1 with a 3% chance of distant recurrence in 9 years on tamoxifen alone. She presents to the clinic today to discuss the pathology report and further treatment.  She uses a wheelchair for ambulation and is complaining of a seroma underneath the left arm.  She is not in much pain at this time and has stopped pain medications.  ALLERGIES:  is allergic to abilify [aripiprazole].  ASSESSMENT & PLAN:   Malignant neoplasm of upper-outer quadrant of left breast in female, estrogen receptor positive ([place]) 03/10/2020:Left lumpectomy Robin Hardin): IDC, grade 1, 1.2cm, with intermediate grade DCIS, clear margins, 5 sentinel lymph nodes negative for carcinoma.  ER 95%, PR 95%, Ki-67 10%, HER-2 negative, T1CN0 stage Ia  Pathology counseling: I discussed the final pathology report of the patient provided  a copy of this report. I discussed the margins as well as lymph node surgeries. We also discussed the final staging along with previously performed ER/PR and HER-2/neu testing.  Oncotype DX score: 1: 3% chance of distant recurrence at 9 years  Treatment plan: 1. Adjuvant radiation therapy followed by 2. Adjuvant antiestrogen therapy  Seroma underneath the left arm: I sent a message to Dr. Ninfa Hardin to evaluate her for drainage. She has appointments for radiation therapy.  Return to clinic at the end of radiation to start antiestrogen therapy  No orders of the defined types were placed in this encounter.  The patient has a good understanding of the overall plan. she agrees with it. she will call with any problems that may develop before the next visit here.  Total time spent: 30 mins including face to face time and time spent for planning, charting and coordination of care  Robin Lose, MD 03/28/2020   Location of Breast Cancer: Left breast   Past/Anticipated interventions by surgeon, if any: Left lumpectomy 03/10/3030  Past/Anticipated interventions by medical oncology, if any: Chemotherapy no  Lymphedema issues, if any:  no left   Pain issues, if any:  no  SAFETY ISSUES:  Prior radiation? no  Pacemaker/ICD? no  Possible current pregnancy? hysterectomy  Is the patient on methotrexate? no  Current Complaints / other details:

## 2020-04-09 ENCOUNTER — Other Ambulatory Visit: Payer: Self-pay | Admitting: Internal Medicine

## 2020-04-09 DIAGNOSIS — I10 Essential (primary) hypertension: Secondary | ICD-10-CM

## 2020-04-10 ENCOUNTER — Other Ambulatory Visit (HOSPITAL_COMMUNITY): Payer: Self-pay | Admitting: Psychiatry

## 2020-04-10 ENCOUNTER — Other Ambulatory Visit: Payer: Self-pay | Admitting: Internal Medicine

## 2020-04-10 DIAGNOSIS — F330 Major depressive disorder, recurrent, mild: Secondary | ICD-10-CM

## 2020-04-10 DIAGNOSIS — E785 Hyperlipidemia, unspecified: Secondary | ICD-10-CM

## 2020-04-11 ENCOUNTER — Other Ambulatory Visit: Payer: Self-pay

## 2020-04-11 ENCOUNTER — Encounter: Payer: Self-pay | Admitting: Radiation Oncology

## 2020-04-11 ENCOUNTER — Ambulatory Visit
Admission: RE | Admit: 2020-04-11 | Discharge: 2020-04-11 | Disposition: A | Discharge status: Home / Self Care | Payer: Medicare HMO | Source: Ambulatory Visit | Attending: Radiation Oncology | Admitting: Radiation Oncology

## 2020-04-11 ENCOUNTER — Ambulatory Visit: Payer: Medicare HMO | Admitting: Physical Therapy

## 2020-04-11 VITALS — BP 108/70 | HR 60 | Temp 97.9°F | Resp 20 | Ht 63.0 in

## 2020-04-11 DIAGNOSIS — Z79899 Other long term (current) drug therapy: Secondary | ICD-10-CM | POA: Diagnosis not present

## 2020-04-11 DIAGNOSIS — R293 Abnormal posture: Secondary | ICD-10-CM

## 2020-04-11 DIAGNOSIS — C50412 Malignant neoplasm of upper-outer quadrant of left female breast: Secondary | ICD-10-CM | POA: Insufficient documentation

## 2020-04-11 DIAGNOSIS — R600 Localized edema: Secondary | ICD-10-CM

## 2020-04-11 DIAGNOSIS — Z510 Encounter for antineoplastic radiation therapy: Secondary | ICD-10-CM | POA: Diagnosis not present

## 2020-04-11 DIAGNOSIS — Z170 Estrogen receptor positive status [ER+]: Secondary | ICD-10-CM | POA: Diagnosis not present

## 2020-04-11 DIAGNOSIS — Z7982 Long term (current) use of aspirin: Secondary | ICD-10-CM | POA: Insufficient documentation

## 2020-04-11 DIAGNOSIS — Z483 Aftercare following surgery for neoplasm: Secondary | ICD-10-CM | POA: Diagnosis not present

## 2020-04-11 DIAGNOSIS — Z7984 Long term (current) use of oral hypoglycemic drugs: Secondary | ICD-10-CM | POA: Diagnosis not present

## 2020-04-11 DIAGNOSIS — Z9889 Other specified postprocedural states: Secondary | ICD-10-CM | POA: Diagnosis not present

## 2020-04-11 DIAGNOSIS — R262 Difficulty in walking, not elsewhere classified: Secondary | ICD-10-CM | POA: Diagnosis not present

## 2020-04-11 NOTE — Progress Notes (Signed)
Radiation Oncology         [phone] ________________________________  Name: Robin Hardin MRN: [id number]  Date: 04/11/2020  DOB: [birth year]  Re-Evaluation Note  CC: Mosetta Anis, MD  Nicholas Lose, MD    ICD-10-CM   1. Malignant neoplasm of upper-outer quadrant of left breast in female, estrogen receptor positive ([place])  C50.412    Z17.0     Diagnosis:  Stage IA (pT1c, pN0, cM0), Left Breast OUQ, Invasive Ductal Carcinoma with intermediate grade DCIS, ER+ / PR+ / Her2-, Grade 1   Narrative:  The patient returns today to discuss radiation treatment options. She was seen in the multidisciplinary breast clinic on 02/17/2020. At that time, it was recommended that she proceed with a left lumpectomy with sentinel lymph node biopsy, oncotype DX, adjuvant radiation therapy, and aromatase inhibitor.  Since consultation, the patient underwent a left breast lumpectomy with deep axillary sentinel lymph node biopsy on 03/10/2020 that was performed by Dr. [name]. Pathology from the procedure revealed grade 1 invasive ductal carcinoma with intermediate grade ductal carcinoma in-situ. The margins were  uninvolved by carcinoma. Five left sentinel lymph nodes were biopsied and all were negative for carcinoma. However, two showed focal non-caseating granulomatous inflammation and one other showed focal foreign body giant cell reaction. The features may have represented a previous biopsy site or underlying inflammatory condition. There were no overt features to suggest a lymphoproliferative disorder.   Oncotype DX was obtained on the final surgical sample and the recurrence score of 1 predicts a risk of recurrence outside the breast over the next 9 years of 3%, if the patient's only systemic therapy is an antiestrogen for 5 years.  It also predicts no signifcant benefit from chemotherapy.  The patient followed-up with Dr. Lindi Adie on 03/28/2020. Given the Oncotype results, chemotherapy was not  recommended. Thus, she was to proceed with adjuvant radiation therapy followed by adjuvant anti-estrogen therapy.   On review of systems, the patient reports healing of incision without erythema, edema, or drainage. She denies breast pain, difficult with range of motion of bilateral upper extremities, nipple discharge, lymphedema, and any other symptoms.    Allergies:  is allergic to abilify [aripiprazole].  Meds: Current Outpatient Medications  Medication Sig Dispense Refill  . allopurinol (ZYLOPRIM) 300 MG tablet Take 1 tablet (300 mg total) by mouth daily. 90 tablet 3  . ALOE PO Take 1 capsule by mouth 2 (two) times daily.    Marland Kitchen aspirin 81 MG EC tablet Take 1 tablet (81 mg total) by mouth daily. 90 tablet 3  . Biotin w/ Vitamins C & E (HAIR SKIN & NAILS GUMMIES PO) Take 1 Dose by mouth daily.    . carvedilol (COREG) 25 MG tablet TAKE 1 TABLET TWICE DAILY WITH MEALS 180 tablet 1  . DULoxetine (CYMBALTA) 60 MG capsule Take 1 capsule (60 mg total) by mouth daily. 90 capsule 0  . furosemide (LASIX) 40 MG tablet Take 0.5 tablets (20 mg total) by mouth daily. 15 tablet 0  . Ginkgo Biloba 40 MG TABS Take 1 tablet by mouth daily.    . metFORMIN (GLUCOPHAGE) 500 MG tablet TAKE 1 TABLET TWICE DAILY WITH MEALS 180 tablet 1  . methocarbamol (ROBAXIN) 500 MG tablet Take 1 tablet (500 mg total) by mouth every 8 (eight) hours as needed for muscle spasms. 60 tablet 2  . Misc Natural Products (APPLE CIDER VINEGAR DIET PO) Take 1 tablet by mouth daily.    Marland Kitchen OVER THE COUNTER MEDICATION Take  1 Dose by mouth daily. Fennel    . potassium chloride SA (KLOR-CON) 20 MEQ tablet Take 1 tablet (20 mEq total) by mouth daily. 90 tablet 3  . rosuvastatin (CRESTOR) 20 MG tablet Take 1 tablet (20 mg total) by mouth daily. 90 tablet 3  . sacubitril-valsartan (ENTRESTO) 49-51 MG Take 1 tablet by mouth 2 (two) times daily. 180 tablet 3  . spironolactone (ALDACTONE) 25 MG tablet TAKE 1 TABLET EVERY DAY 90 tablet 3  .  traZODone (DESYREL) 100 MG tablet Take 1 tablet (100 mg total) by mouth at bedtime. 90 tablet 0  . Vitamin D, Ergocalciferol, (DRISDOL) 1.25 MG (50000 UNIT) CAPS capsule Take 1 capsule (50,000 Units total) by mouth every 7 (seven) days. 4 capsule 0   No current facility-administered medications for this encounter.    Physical Findings: The patient is in no acute distress. Patient is alert and oriented.  height is 5' 3" (1.6 m). Her temperature is 97.9 F (36.6 C). Her blood pressure is 108/70 and her pulse is 60. Her respiration is 20 and oxygen saturation is 98%.  No significant changes. Lungs are clear to auscultation bilaterally. Heart has regular rate and rhythm. No palpable cervical, supraclavicular, or axillary adenopathy. Abdomen soft, non-tender, normal bowel sounds. Right breast: no palpable mass, nipple discharge or bleeding. Left Breast: Well-healing scar in the approximately 2 o'clock position of the left breast.  No signs of drainage or infection.  No dominant mass appreciated in the breast.  She also has a separate scar in the axillary region from her sentinel node procedure.  This is also healing well too.  Lab Findings: Lab Results  Component Value Date   WBC 3.7 (L) 03/03/2020   HGB 10.8 (L) 03/03/2020   HCT 35.6 (L) 03/03/2020   MCV 101.1 (H) 03/03/2020   PLT 141 (L) 03/03/2020    Radiographic Findings: No results found.  Impression: Stage IA (pT1c, pN0, cM0), Left Breast OUQ, Invasive Ductal Carcinoma with intermediate grade DCIS, ER+ / PR+ / Her2-, Grade 1   Patient will be a good candidate for breast conservation with radiation therapy directed at the left breast.  In addition the patient would appear to be a candidate for hypofractionated accelerated radiation therapy over approximately 4 weeks.  Will use cardiac sparing techniques if necessary  Plan:  Patient is scheduled for CT simulation later today.  Anticipate treatment starting in 1 week totaling  approximately 4 weeks.  Total time spent in this encounter was 35 minutes which included reviewing the patient's most recent lumpectomy, biopsies, pathology reports, Oncoptype, follow-up with Dr. Lindi Adie, physical examination, and documentation.  -----------------------------------  Blair Promise, PhD, MD  This document serves as a record of services personally performed by Gery Pray, MD. It was created on his behalf by Clerance Lav, a trained medical scribe. The creation of this record is based on the scribe's personal observations and the provider's statements to them. This document has been checked and approved by the attending provider.

## 2020-04-11 NOTE — Therapy (Signed)
[place], [place], [address] Phone: [phone]   Fax:  [phone]  Physical Therapy Treatment  Patient Details  Name: Robin Hardin MRN: [id number] Date of Birth: Feb 25, 1951 Referring Provider (PT): Dr. Coralie Keens   Encounter Date: 04/11/2020   PT End of Session - 04/11/20 1100    Visit Number 4    Number of Visits 10    Date for PT Re-Evaluation 05/02/20    PT Start Time 1010    PT Stop Time 1050   time interrupted by pt going to restroom   PT Time Calculation (min) 40 min    Activity Tolerance Patient tolerated treatment well    Behavior During Therapy [facility] for tasks assessed/performed           Past Medical History:  Diagnosis Date   Acute respiratory failure with hypoxia ([place]) 09/26/2012   Anxiety    Arthritis    Back pain    Bilateral knee pain    Cellulitis of lower leg    left   Chronic combined systolic and diastolic congestive heart failure ([place]) 10/10/2012   2 D echo 09/2012:  Left ventricle: Extremely poor acoustic windows limit study. Overall LVEF appears to be severely depressed. Recomm ordering limited study with contrast to further evaluate LV systolic function. The cavity size was normal. Wall thickness was normal. Doppler parameters are consistent with abnormal left ventricular relaxation (grade 1 diastolic dysfunction).   2 D echo 03/2009 :  Left ventricle: The cavity size was normal. There was mild concentric hypertrophy. Systolic function was mildly reduced. The estimated ejection fraction was in the range of 45% to 50%. Diffuse hypokinesis. Doppler parameters are consistent with abnormal left ventricular relaxation (grade 1 diastolic dysfunction).      Chronic kidney disease 10/10/2012   Stage 2 with GFR of 64    Constipation    Depression    Edema, lower extremity    Gout    H/O: hysterectomy    History of heart attack    Hyperlipidemia     Hypertension    Joint pain    Malignant hypertension 09/26/2012   Morbid obesity ([place])    Myocardial infarction ([place])    Nonischemic cardiomyopathy ([place])    Shortness of breath    Sleep apnea    reported intolerance to CPAP (03/04/20)   Unspecified essential hypertension 03/22/2009   Qualifier: Diagnosis of  By: Karrie Meres RN, BSN, [name]     Vitamin D deficiency     Past Surgical History:  Procedure Laterality Date   BREAST LUMPECTOMY WITH RADIOACTIVE SEED AND SENTINEL LYMPH NODE BIOPSY Left 03/10/2020   Procedure: LEFT BREAST LUMPECTOMY WITH RADIOACTIVE SEED AND SENTINEL LYMPH NODE BIOPSY;  Surgeon: Coralie Keens, MD;  Location: [facility];  Service: General;  Laterality: Left;   fibroid tumor removal  2004    There were no vitals filed for this visit.   Subjective Assessment - 04/11/20 1013    Subjective Pt says got her compression bra from [employer] but it rolls up She is thinking about getting another one.  She feels that the swelling in her armpit is better but she still has the swelling in her arm    Pertinent History Patient was diagnosed on 01/21/2020 with left grade II invasive ductal carcinoma breast cancer. Patient reports she underwent a left lumpectomy on 03/10/2020 with a sentinel node biopsy (5 negative nodes removed). It is ER/PR positive and HER2 negative with a Ki67 of  10%. Patient is morbidly obese and has limitations with ambulation.    Patient Stated Goals See if my arm is doing ok    Currently in Pain? No/denies                             [facility] Adult PT Treatment/Exercise - 04/11/20 0001      Exercises   Exercises Shoulder      Shoulder Exercises: Supine   Protraction AAROM;Both;5 reps   with dowel    Flexion AAROM;Right;Left;10 reps   with dowel      Shoulder Exercises: Seated   Other Seated Exercises manual isometric shoulder flexion, extension /external rotation holding yellow theraband in both hands at waist keeping left arm still and  swinging right arm out to side.     Other Seated Exercises 10 reps of right arm flexion followed by improved left arm flexion,, 10 reps of rotation to right followed by improved rotation to left       Manual Therapy   Manual Lymphatic Drainage (MLD) brief session of MLD to left trunk and tightness in axilla interrupted by pt needing to use the restroom                        PT Long Term Goals - 04/04/20 1359      PT LONG TERM GOAL #1   Title Patient will demonstrate she has returned to baseline with shoulder ROM and function post operatively compared to baseline measurements.    Time 4    Period Weeks    Status On-going    Target Date 05/02/20      PT LONG TERM GOAL #2   Title Patient will report >/= 40% reduction in left axillary edema for improved comfort with daily tasks.    Time 4    Period Weeks    Status New    Target Date 05/02/20      PT LONG TERM GOAL #3   Title Patient will improve her DASH score to be </= 45.45 for improved overall upper extremity function.    Baseline 70.45 post op; 45.45 pre-op    Time 4    Period Weeks    Status New    Target Date 05/02/20      PT LONG TERM GOAL #4   Title Patient will be educated on lymphedema risk reduction strategies.    Time 4    Period Weeks    Status New    Target Date 05/02/20                 Plan - 04/11/20 1101    Clinical Impression Statement Pt continues with tightness in left axilla and upper quarant with weakness with 10 reps of active movment.  Pt with limited session as needing to use the restroom during her treatment time.  She was able to do well with isometric exercise and active exerise of the good side.    PT Frequency 2x / week    PT Duration 4 weeks    PT Treatment/Interventions ADLs/Self Care Home Management;Therapeutic exercise;Patient/family education;Manual techniques;Manual lymph drainage    PT Next Visit Plan continue active exercise  manual lymph drainage left axilla and arm  with attention to left upper arm and elbow  cont shoulder ROM exercises to maintain and possibly improve ROM    PT Home Exercise Plan Post op shoulder ROM HEP    Consulted  and Agree with Plan of Care Patient           Patient will benefit from skilled therapeutic intervention in order to improve the following deficits and impairments:  Decreased knowledge of precautions, Impaired UE functional use, Pain, Postural dysfunction, Decreased range of motion, Increased edema  Visit Diagnosis: Abnormal posture  Aftercare following surgery for neoplasm  Localized edema     Problem List Patient Active Problem List   Diagnosis Date Noted   History of left breast cancer 03/10/2020   Malignant neoplasm of upper-outer quadrant of left breast in female, estrogen receptor positive ([place]) 02/15/2020   Drug-induced leukopenia ([place]) 01/04/2020   Low back pain 11/10/2019   Breast cancer screening by mammogram 05/13/2018   Mobility impaired 02/04/2018   Vitamin D deficiency 05/21/2017   Normocytic anemia 12/28/2014   History of diabetes mellitus 07/27/2014   Health care maintenance 08/25/2013   Depression 03/03/2013   Chronic combined systolic and diastolic congestive heart failure ([place]) 10/10/2012   CKD (chronic kidney disease) stage 3, GFR 30-59 ml/min 10/10/2012   Gout 10/09/2012   Hyperlipidemia 05/31/2009   Obstructive sleep apnea 04/07/2009   Morbid obesity with BMI of 50.0-59.9, adult ([place]) 03/22/2009   Essential hypertension 03/22/2009   Donato Heinz. Owens Shark PT  Norwood Levo 04/11/2020, 11:06 AM  [place], [place], [address] Phone: [phone]   Fax:  [phone]  Name: CAMILLIA MARCY MRN: [id number] Date of Birth: 1951-02-13

## 2020-04-11 NOTE — Progress Notes (Addendum)
Weight and vitals stable. Denies pain. Schedule to follow up with surgeon tomorrow. Demonstrates full ROM of both upper extremities. Reports incision is well healed without redness, edema or drainage. Denies nipple discharge. No evidence of lymphedema.   BP 108/70   Pulse 60   Temp 97.9 F (36.6 C)   Resp 20   Ht _0  (1.6 m)   SpO2 98%   BMI 51.37 kg/m

## 2020-04-11 NOTE — Patient Instructions (Signed)
Hold yellow band in both hands with elbows bent at waist, thumbs up. Make sure your keep left arm stilll  And swing right arm out to side.   You should feel work in your shoulder blades

## 2020-04-13 DIAGNOSIS — Z510 Encounter for antineoplastic radiation therapy: Secondary | ICD-10-CM | POA: Diagnosis not present

## 2020-04-13 DIAGNOSIS — C50412 Malignant neoplasm of upper-outer quadrant of left female breast: Secondary | ICD-10-CM | POA: Diagnosis not present

## 2020-04-13 DIAGNOSIS — Z170 Estrogen receptor positive status [ER+]: Secondary | ICD-10-CM | POA: Diagnosis not present

## 2020-04-18 ENCOUNTER — Other Ambulatory Visit: Payer: Self-pay

## 2020-04-18 ENCOUNTER — Telehealth: Payer: Self-pay | Admitting: Hematology and Oncology

## 2020-04-18 ENCOUNTER — Ambulatory Visit
Admission: RE | Admit: 2020-04-18 | Discharge: 2020-04-18 | Disposition: A | Discharge status: Home / Self Care | Payer: Medicare HMO | Source: Ambulatory Visit | Attending: Radiation Oncology | Admitting: Radiation Oncology

## 2020-04-18 ENCOUNTER — Ambulatory Visit: Payer: Medicare HMO

## 2020-04-18 ENCOUNTER — Encounter: Payer: Self-pay | Admitting: *Deleted

## 2020-04-18 ENCOUNTER — Telehealth: Payer: Self-pay | Admitting: Licensed Clinical Social Worker

## 2020-04-18 DIAGNOSIS — Z510 Encounter for antineoplastic radiation therapy: Secondary | ICD-10-CM | POA: Diagnosis not present

## 2020-04-18 DIAGNOSIS — Z483 Aftercare following surgery for neoplasm: Secondary | ICD-10-CM | POA: Diagnosis not present

## 2020-04-18 DIAGNOSIS — Z170 Estrogen receptor positive status [ER+]: Secondary | ICD-10-CM | POA: Diagnosis not present

## 2020-04-18 DIAGNOSIS — R600 Localized edema: Secondary | ICD-10-CM

## 2020-04-18 DIAGNOSIS — R262 Difficulty in walking, not elsewhere classified: Secondary | ICD-10-CM

## 2020-04-18 DIAGNOSIS — R293 Abnormal posture: Secondary | ICD-10-CM | POA: Diagnosis not present

## 2020-04-18 DIAGNOSIS — C50412 Malignant neoplasm of upper-outer quadrant of left female breast: Secondary | ICD-10-CM | POA: Diagnosis not present

## 2020-04-18 NOTE — Patient Instructions (Signed)
Over Head Pull: Narrow and Wide Grip   Cancer Rehab [phone]   On back, knees bent, feet flat, band across thighs, elbows straight but relaxed. Pull hands apart (start). Keeping elbows straight, bring arms up and over head, hands toward floor. Keep pull steady on band. Hold momentarily. Return slowly, keeping pull steady, back to start. Then do same with a wider grip on the band (past shoulder width) Repeat _5-10__ times. Band color __yellow____   Side Pull: Double Arm   On back, knees bent, feet flat. Arms perpendicular to body, shoulder level, elbows straight but relaxed. Pull arms out to sides, elbows straight. Resistance band comes across collarbones, hands toward floor. Hold momentarily. Slowly return to starting position. Repeat _5-10__ times. Band color _yellow____   Sword   On back, knees bent, feet flat, left hand on left hip, right hand above left. Pull right arm DIAGONALLY (hip to shoulder) across chest. Bring right arm along head toward floor. Hold momentarily. Slowly return to starting position. Repeat _5-10__ times. Do with left arm. Band color _yellow_____   Shoulder Rotation: Double Arm   On back, knees bent, feet flat, elbows tucked at sides, bent 90, hands palms up. Pull hands apart and down toward floor, keeping elbows near sides. Hold momentarily. Slowly return to starting position. Repeat _5-10__ times. Band color __yellow____

## 2020-04-18 NOTE — Progress Notes (Signed)
  Radiation Oncology         [phone] ________________________________  Name: Robin Hardin MRN: [id number]  Date: 04/18/2020  DOB: [birth year]  Simulation Verification Note    ICD-10-CM   1. Malignant neoplasm of upper-outer quadrant of left breast in female, estrogen receptor positive ([place])  C50.412    Z17.0     NARRATIVE: The patient was brought to the treatment unit and placed in the planned treatment position. The clinical setup was verified. Then port films were obtained and uploaded to the radiation oncology medical record software.  The treatment beams were carefully compared against the planned radiation fields. The position location and shape of the radiation fields was reviewed. The targeted volume of tissue appears to be appropriately covered by the radiation beams. Organs at risk appear to be excluded as planned.  Based on my personal review, I approved the simulation verification. The patient's treatment will proceed as planned.  -----------------------------------  Blair Promise, PhD, MD  This document serves as a record of services personally performed by Gery Pray, MD. It was created on his behalf by Clerance Lav, a trained medical scribe. The creation of this record is based on the scribe's personal observations and the provider's statements to them. This document has been checked and approved by the attending provider.

## 2020-04-18 NOTE — Telephone Encounter (Signed)
[place] CSW Progress Note  Clinical Social Worker attempted to contact patient by phone to follow-up on resources. Previously referred to transportation, received notification that there are possible needs related to food.  No answer. Left VM with direct contact information.    Edwinna Areola Tyrone Balash , LCSW

## 2020-04-18 NOTE — Therapy (Signed)
[place], [place], [address] Phone: [phone]   Fax:  [phone]  Physical Therapy Treatment  Patient Details  Name: Robin Hardin MRN: [id number] Date of Birth: Apr 06, 1951 Referring Provider (PT): Dr. Coralie Keens   Encounter Date: 04/18/2020   PT End of Session - 04/18/20 1059    Visit Number 5    Number of Visits 10    Date for PT Re-Evaluation 05/02/20    PT Start Time 1006    PT Stop Time 1102    PT Time Calculation (min) 56 min    Activity Tolerance Patient tolerated treatment well    Behavior During Therapy [facility] for tasks assessed/performed           Past Medical History:  Diagnosis Date  . Acute respiratory failure with hypoxia ([place]) 09/26/2012  . Anxiety   . Arthritis   . Back pain   . Bilateral knee pain   . Cellulitis of lower leg    left  . Chronic combined systolic and diastolic congestive heart failure ([place]) 10/10/2012   2 D echo 09/2012:  Left ventricle: Extremely poor acoustic windows limit study. Overall LVEF appears to be severely depressed. Recomm ordering limited study with contrast to further evaluate LV systolic function. The cavity size was normal. Wall thickness was normal. Doppler parameters are consistent with abnormal left ventricular relaxation (grade 1 diastolic dysfunction).   2 D echo 03/2009 :  Left ventricle: The cavity size was normal. There was mild concentric hypertrophy. Systolic function was mildly reduced. The estimated ejection fraction was in the range of 45% to 50%. Diffuse hypokinesis. Doppler parameters are consistent with abnormal left ventricular relaxation (grade 1 diastolic dysfunction).     . Chronic kidney disease 10/10/2012   Stage 2 with GFR of 64   . Constipation   . Depression   . Edema, lower extremity   . Gout   . H/O: hysterectomy   . History of heart attack   . Hyperlipidemia   . Hypertension   . Joint pain   . Malignant  hypertension 09/26/2012  . Morbid obesity ([place])   . Myocardial infarction ([place])   . Nonischemic cardiomyopathy ([place])   . Shortness of breath   . Sleep apnea    reported intolerance to CPAP (03/04/20)  . Unspecified essential hypertension 03/22/2009   Qualifier: Diagnosis of  By: Karrie Meres RN, BSN, [name]    . Vitamin D deficiency     Past Surgical History:  Procedure Laterality Date  . BREAST LUMPECTOMY WITH RADIOACTIVE SEED AND SENTINEL LYMPH NODE BIOPSY Left 03/10/2020   Procedure: LEFT BREAST LUMPECTOMY WITH RADIOACTIVE SEED AND SENTINEL LYMPH NODE BIOPSY;  Surgeon: Coralie Keens, MD;  Location: [place];  Service: General;  Laterality: Left;  . fibroid tumor removal  2004    There were no vitals filed for this visit.   Subjective Assessment - 04/18/20 1009    Subjective I slept on my Lt shoulder wrong and it's sore today. I haven't been doing my exercises as often as I should.    Pertinent History Patient was diagnosed on 01/21/2020 with left grade II invasive ductal carcinoma breast cancer. Patient reports she underwent a left lumpectomy on 03/10/2020 with a sentinel node biopsy (5 negative nodes removed). It is ER/PR positive and HER2 negative with a Ki67 of 10%. Patient is morbidly obese and has limitations with ambulation.    Patient Stated Goals See if my arm is doing ok  Currently in Pain? No/denies                             [facility] Adult PT Treatment/Exercise - 04/18/20 0001      Shoulder Exercises: Supine   Flexion AAROM;Right;Left;5 reps   5 sec holds with dowel   ABduction AAROM;Left;5 reps   5 sec holds with dowel   Other Supine Exercises butterfly stretch 5x, 5 sec holds returning therapist demo    Other Supine Exercises Also had pt return demo for each supine scapular series with yellow theraband x5 each and VCs for correct UE positioning throughout      Manual Therapy   Manual Lymphatic Drainage (MLD) In Supine with HOB elevated: Short neck, 5  diaphragmatic breaths, Rt axillary nodes, Lt inguinal nodes, then anterior inter-axillary and Lt axillo-inguinal anastomosis, then to left trunk and tightness in axilla, then to Lt UE working from proximal to distal then retracing all steps.     Passive ROM to left shoulder in limits of ROM into flexion and abduction                  PT Education - 04/18/20 1059    Education Details Supine scapular series with yellow theraband    Person(s) Educated Patient    Methods Explanation;Demonstration;Handout    Comprehension Verbalized understanding;Returned demonstration;Tactile cues required;Need further instruction               PT Long Term Goals - 04/04/20 1359      PT LONG TERM GOAL #1   Title Patient will demonstrate she has returned to baseline with shoulder ROM and function post operatively compared to baseline measurements.    Time 4    Period Weeks    Status On-going    Target Date 05/02/20      PT LONG TERM GOAL #2   Title Patient will report >/= 40% reduction in left axillary edema for improved comfort with daily tasks.    Time 4    Period Weeks    Status New    Target Date 05/02/20      PT LONG TERM GOAL #3   Title Patient will improve her DASH score to be </= 45.45 for improved overall upper extremity function.    Baseline 70.45 post op; 45.45 pre-op    Time 4    Period Weeks    Status New    Target Date 05/02/20      PT LONG TERM GOAL #4   Title Patient will be educated on lymphedema risk reduction strategies.    Time 4    Period Weeks    Status New    Target Date 05/02/20                 Plan - 04/18/20 1101    Clinical Impression Statement Continued with manual lymph drainage of Lt upper quadrant, P/ROM of shoulder but also including some AA/ROM, and progressed HEP to include supine scapular series which she tolerated very well. Pt reports her shoulder feeling looser and was not able to palpate firmness in axilla that she had ben  experiencing. Overall she is doing well and making good progress.    Stability/Clinical Decision Making Stable/Uncomplicated    Rehab Potential Excellent    PT Frequency 2x / week    PT Duration 4 weeks    PT Treatment/Interventions ADLs/Self Care Home Management;Therapeutic exercise;Patient/family education;Manual techniques;Manual lymph drainage  PT Next Visit Plan continue active exercise and review HEP issued today;  manual lymph drainage left axilla and arm with attention to left upper arm and elbow;  cont shoulder ROM exercises to maintain and possibly improve ROM    PT Home Exercise Plan Post op shoulder ROM HEP; supine scapular series    Consulted and Agree with Plan of Care Patient           Patient will benefit from skilled therapeutic intervention in order to improve the following deficits and impairments:  Decreased knowledge of precautions, Impaired UE functional use, Pain, Postural dysfunction, Decreased range of motion, Increased edema  Visit Diagnosis: Abnormal posture  Aftercare following surgery for neoplasm  Localized edema  Malignant neoplasm of upper-outer quadrant of left breast in female, estrogen receptor positive ([place])  Difficulty in walking, not elsewhere classified     Problem List Patient Active Problem List   Diagnosis Date Noted  . History of left breast cancer 03/10/2020  . Malignant neoplasm of upper-outer quadrant of left breast in female, estrogen receptor positive ([place]) 02/15/2020  . Drug-induced leukopenia ([place] 01/04/2020  . Low back pain 11/10/2019  . Breast cancer screening by mammogram 05/13/2018  . Mobility impaired 02/04/2018  . Vitamin D deficiency 05/21/2017  . Normocytic anemia 12/28/2014  . History of diabetes mellitus 07/27/2014  . Health care maintenance 08/25/2013  . Depression 03/03/2013  . Chronic combined systolic and diastolic congestive heart failure ([place]) 10/10/2012  . CKD (chronic kidney disease) stage 3, GFR 30-59  ml/min 10/10/2012  . Gout 10/09/2012  . Hyperlipidemia 05/31/2009  . Obstructive sleep apnea 04/07/2009  . Morbid obesity with BMI of 50.0-59.9, adult ([place]) 03/22/2009  . Essential hypertension 03/22/2009    Otelia Limes, PTA 04/18/2020, 11:07 AM  [place], [place], [address] Phone: [phone]   Fax:  [phone]  Name: Robin Hardin MRN: [id number] Date of Birth: 1951-02-01

## 2020-04-18 NOTE — Telephone Encounter (Signed)
Scheduled appt per 8/30 sch msg - mailed reminder letter with apt date and time

## 2020-04-19 ENCOUNTER — Ambulatory Visit
Admission: RE | Admit: 2020-04-19 | Discharge: 2020-04-19 | Disposition: A | Discharge status: Home / Self Care | Payer: Medicare HMO | Source: Ambulatory Visit | Attending: Radiation Oncology | Admitting: Radiation Oncology

## 2020-04-19 ENCOUNTER — Other Ambulatory Visit: Payer: Self-pay

## 2020-04-19 DIAGNOSIS — C50412 Malignant neoplasm of upper-outer quadrant of left female breast: Secondary | ICD-10-CM | POA: Diagnosis not present

## 2020-04-19 DIAGNOSIS — Z510 Encounter for antineoplastic radiation therapy: Secondary | ICD-10-CM | POA: Diagnosis not present

## 2020-04-19 DIAGNOSIS — Z170 Estrogen receptor positive status [ER+]: Secondary | ICD-10-CM | POA: Diagnosis not present

## 2020-04-20 ENCOUNTER — Ambulatory Visit
Admission: RE | Admit: 2020-04-20 | Discharge: 2020-04-20 | Disposition: A | Discharge status: Home / Self Care | Payer: Medicare HMO | Source: Ambulatory Visit | Attending: Radiation Oncology | Admitting: Radiation Oncology

## 2020-04-20 DIAGNOSIS — Z510 Encounter for antineoplastic radiation therapy: Secondary | ICD-10-CM | POA: Insufficient documentation

## 2020-04-20 DIAGNOSIS — Z170 Estrogen receptor positive status [ER+]: Secondary | ICD-10-CM | POA: Diagnosis not present

## 2020-04-20 DIAGNOSIS — C50412 Malignant neoplasm of upper-outer quadrant of left female breast: Secondary | ICD-10-CM | POA: Diagnosis not present

## 2020-04-20 MED ORDER — SONAFINE EX EMUL
1.0000 "application " | Freq: Two times a day (BID) | CUTANEOUS | Status: DC
  Administered 2020-04-20: 1 via TOPICAL

## 2020-04-20 MED ORDER — ALRA NON-METALLIC DEODORANT (RAD-ONC)
1.0000 "application " | Freq: Once | TOPICAL | Status: AC
  Administered 2020-04-20: 1 via TOPICAL

## 2020-04-21 ENCOUNTER — Ambulatory Visit
Admission: RE | Admit: 2020-04-21 | Discharge: 2020-04-21 | Disposition: A | Discharge status: Home / Self Care | Payer: Medicare HMO | Source: Ambulatory Visit | Attending: Radiation Oncology | Admitting: Radiation Oncology

## 2020-04-21 DIAGNOSIS — Z170 Estrogen receptor positive status [ER+]: Secondary | ICD-10-CM | POA: Diagnosis not present

## 2020-04-21 DIAGNOSIS — Z510 Encounter for antineoplastic radiation therapy: Secondary | ICD-10-CM | POA: Diagnosis not present

## 2020-04-21 DIAGNOSIS — C50412 Malignant neoplasm of upper-outer quadrant of left female breast: Secondary | ICD-10-CM | POA: Diagnosis not present

## 2020-04-22 ENCOUNTER — Other Ambulatory Visit: Payer: Self-pay

## 2020-04-22 ENCOUNTER — Ambulatory Visit
Admission: RE | Admit: 2020-04-22 | Discharge: 2020-04-22 | Disposition: A | Discharge status: Home / Self Care | Payer: Medicare HMO | Source: Ambulatory Visit | Attending: Radiation Oncology | Admitting: Radiation Oncology

## 2020-04-22 ENCOUNTER — Ambulatory Visit: Payer: Medicare HMO | Attending: Surgery

## 2020-04-22 DIAGNOSIS — Z170 Estrogen receptor positive status [ER+]: Secondary | ICD-10-CM | POA: Diagnosis not present

## 2020-04-22 DIAGNOSIS — R600 Localized edema: Secondary | ICD-10-CM | POA: Insufficient documentation

## 2020-04-22 DIAGNOSIS — Z483 Aftercare following surgery for neoplasm: Secondary | ICD-10-CM | POA: Diagnosis not present

## 2020-04-22 DIAGNOSIS — R262 Difficulty in walking, not elsewhere classified: Secondary | ICD-10-CM

## 2020-04-22 DIAGNOSIS — R293 Abnormal posture: Secondary | ICD-10-CM

## 2020-04-22 DIAGNOSIS — C50412 Malignant neoplasm of upper-outer quadrant of left female breast: Secondary | ICD-10-CM | POA: Diagnosis not present

## 2020-04-22 DIAGNOSIS — Z510 Encounter for antineoplastic radiation therapy: Secondary | ICD-10-CM | POA: Diagnosis not present

## 2020-04-22 NOTE — Therapy (Signed)
[place], [place], [address] Phone: [phone]   Fax:  [phone]  Physical Therapy Treatment  Patient Details  Name: Robin Hardin MRN: [id number] Date of Birth: March 27, 1951 Referring Provider (PT): Dr. Coralie Keens   Encounter Date: 04/22/2020   PT End of Session - 04/22/20 1005    Visit Number 6    Number of Visits 10    Date for PT Re-Evaluation 05/02/20    PT Start Time 0908    PT Stop Time 1005    PT Time Calculation (min) 57 min    Activity Tolerance Patient tolerated treatment well    Behavior During Therapy [facility] for tasks assessed/performed           Past Medical History:  Diagnosis Date  . Acute respiratory failure with hypoxia ([place]) 09/26/2012  . Anxiety   . Arthritis   . Back pain   . Bilateral knee pain   . Cellulitis of lower leg    left  . Chronic combined systolic and diastolic congestive heart failure ([place]) 10/10/2012   2 D echo 09/2012:  Left ventricle: Extremely poor acoustic windows limit study. Overall LVEF appears to be severely depressed. Recomm ordering limited study with contrast to further evaluate LV systolic function. The cavity size was normal. Wall thickness was normal. Doppler parameters are consistent with abnormal left ventricular relaxation (grade 1 diastolic dysfunction).   2 D echo 03/2009 :  Left ventricle: The cavity size was normal. There was mild concentric hypertrophy. Systolic function was mildly reduced. The estimated ejection fraction was in the range of 45% to 50%. Diffuse hypokinesis. Doppler parameters are consistent with abnormal left ventricular relaxation (grade 1 diastolic dysfunction).     . Chronic kidney disease 10/10/2012   Stage 2 with GFR of 64   . Constipation   . Depression   . Edema, lower extremity   . Gout   . H/O: hysterectomy   . History of heart attack   . Hyperlipidemia   . Hypertension   . Joint pain   . Malignant  hypertension 09/26/2012  . Morbid obesity ([place])   . Myocardial infarction ([place])   . Nonischemic cardiomyopathy ([place])   . Shortness of breath   . Sleep apnea    reported intolerance to CPAP (03/04/20)  . Unspecified essential hypertension 03/22/2009   Qualifier: Diagnosis of  By: Karrie Meres RN, BSN, [name]    . Vitamin D deficiency     Past Surgical History:  Procedure Laterality Date  . BREAST LUMPECTOMY WITH RADIOACTIVE SEED AND SENTINEL LYMPH NODE BIOPSY Left 03/10/2020   Procedure: LEFT BREAST LUMPECTOMY WITH RADIOACTIVE SEED AND SENTINEL LYMPH NODE BIOPSY;  Surgeon: Coralie Keens, MD;  Location: [place];  Service: General;  Laterality: Left;  . fibroid tumor removal  2004    There were no vitals filed for this visit.   Subjective Assessment - 04/22/20 0919    Subjective I'm doing a little better at home though I haven't been doing my exercises like I should. I can reach higher now.    Pertinent History Patient was diagnosed on 01/21/2020 with left grade II invasive ductal carcinoma breast cancer. Patient reports she underwent a left lumpectomy on 03/10/2020 with a sentinel node biopsy (5 negative nodes removed). It is ER/PR positive and HER2 negative with a Ki67 of 10%. Patient is morbidly obese and has limitations with ambulation.    Patient Stated Goals See if my arm is doing ok  Currently in Pain? No/denies                             [facility] Adult PT Treatment/Exercise - 04/22/20 0001      Shoulder Exercises: Pulleys   Flexion 2 minutes    Flexion Limitations Tactile cues and demo for correct technique    ABduction 2 minutes    ABduction Limitations Tactile cues and demo for correct technique      Manual Therapy   Manual Lymphatic Drainage (MLD) In Supine with HOB elevated: Short neck, 5 diaphragmatic breaths, Rt axillary nodes, Lt inguinal nodes, then anterior inter-axillary and Lt axillo-inguinal anastomosis, then to left trunk and tightness in axilla, then to  Lt UE working from proximal to distal then retracing all steps.     Passive ROM to left shoulder in limits of ROM into flexion and abduction                       PT Long Term Goals - 04/04/20 1359      PT LONG TERM GOAL #1   Title Patient will demonstrate she has returned to baseline with shoulder ROM and function post operatively compared to baseline measurements.    Time 4    Period Weeks    Status On-going    Target Date 05/02/20      PT LONG TERM GOAL #2   Title Patient will report >/= 40% reduction in left axillary edema for improved comfort with daily tasks.    Time 4    Period Weeks    Status New    Target Date 05/02/20      PT LONG TERM GOAL #3   Title Patient will improve her DASH score to be </= 45.45 for improved overall upper extremity function.    Baseline 70.45 post op; 45.45 pre-op    Time 4    Period Weeks    Status New    Target Date 05/02/20      PT LONG TERM GOAL #4   Title Patient will be educated on lymphedema risk reduction strategies.    Time 4    Period Weeks    Status New    Target Date 05/02/20                 Plan - 04/22/20 1006    Clinical Impression Statement Added AA/ROM with pulleys with pt seated in power WC. She tolerated this well though did require multiple tactile cues to decrease Lt>Rt scapular compensations. Continued with manual therapy to Lt upper quadrant. She was able to relax better during P/ROM this session and increased motion noted with less discomfort to pt. Encouraged her to work towards being more compliant with HEP instructing her that this will continue to help to further promote her improvement.    Stability/Clinical Decision Making Stable/Uncomplicated    Rehab Potential Excellent    PT Frequency 2x / week    PT Duration 4 weeks    PT Treatment/Interventions ADLs/Self Care Home Management;Therapeutic exercise;Patient/family education;Manual techniques;Manual lymph drainage    PT Next Visit Plan  continue active exercise and review HEP issued today;  manual lymph drainage left axilla and arm with attention to left upper arm and elbow;  cont shoulder ROM exercises to maintain and possibly improve ROM    PT Home Exercise Plan Post op shoulder ROM HEP; supine scapular series    Consulted and Agree with Plan  of Care Patient           Patient will benefit from skilled therapeutic intervention in order to improve the following deficits and impairments:  Decreased knowledge of precautions, Impaired UE functional use, Pain, Postural dysfunction, Decreased range of motion, Increased edema  Visit Diagnosis: Abnormal posture  Aftercare following surgery for neoplasm  Localized edema  Malignant neoplasm of upper-outer quadrant of left breast in female, estrogen receptor positive ([place])  Difficulty in walking, not elsewhere classified     Problem List Patient Active Problem List   Diagnosis Date Noted  . History of left breast cancer 03/10/2020  . Malignant neoplasm of upper-outer quadrant of left breast in female, estrogen receptor positive ([place]) 02/15/2020  . Drug-induced leukopenia ([place]) 01/04/2020  . Low back pain 11/10/2019  . Breast cancer screening by mammogram 05/13/2018  . Mobility impaired 02/04/2018  . Vitamin D deficiency 05/21/2017  . Normocytic anemia 12/28/2014  . History of diabetes mellitus 07/27/2014  . Health care maintenance 08/25/2013  . Depression 03/03/2013  . Chronic combined systolic and diastolic congestive heart failure ([place]) 10/10/2012  . CKD (chronic kidney disease) stage 3, GFR 30-59 ml/min 10/10/2012  . Gout 10/09/2012  . Hyperlipidemia 05/31/2009  . Obstructive sleep apnea 04/07/2009  . Morbid obesity with BMI of 50.0-59.9, adult ([place]) 03/22/2009  . Essential hypertension 03/22/2009    Otelia Limes, PTA 04/22/2020, 10:27 AM  [place], [place],  [address] Phone: [phone]   Fax:  [phone]  Name: DEVEN AUDI MRN: [id number] Date of Birth: 1950-09-01

## 2020-04-24 ENCOUNTER — Other Ambulatory Visit (INDEPENDENT_AMBULATORY_CARE_PROVIDER_SITE_OTHER): Payer: Self-pay | Admitting: Bariatrics

## 2020-04-24 DIAGNOSIS — I5042 Chronic combined systolic (congestive) and diastolic (congestive) heart failure: Secondary | ICD-10-CM

## 2020-04-25 ENCOUNTER — Other Ambulatory Visit: Payer: Self-pay | Admitting: Internal Medicine

## 2020-04-25 DIAGNOSIS — G8929 Other chronic pain: Secondary | ICD-10-CM

## 2020-04-26 ENCOUNTER — Ambulatory Visit
Admission: RE | Admit: 2020-04-26 | Discharge: 2020-04-26 | Disposition: A | Discharge status: Home / Self Care | Payer: Medicare HMO | Source: Ambulatory Visit | Attending: Radiation Oncology | Admitting: Radiation Oncology

## 2020-04-26 DIAGNOSIS — C50412 Malignant neoplasm of upper-outer quadrant of left female breast: Secondary | ICD-10-CM | POA: Diagnosis not present

## 2020-04-26 DIAGNOSIS — F419 Anxiety disorder, unspecified: Secondary | ICD-10-CM | POA: Diagnosis not present

## 2020-04-26 DIAGNOSIS — F330 Major depressive disorder, recurrent, mild: Secondary | ICD-10-CM | POA: Diagnosis not present

## 2020-04-26 DIAGNOSIS — Z170 Estrogen receptor positive status [ER+]: Secondary | ICD-10-CM | POA: Diagnosis not present

## 2020-04-26 DIAGNOSIS — Z510 Encounter for antineoplastic radiation therapy: Secondary | ICD-10-CM | POA: Diagnosis not present

## 2020-04-27 ENCOUNTER — Ambulatory Visit
Admission: RE | Admit: 2020-04-27 | Discharge: 2020-04-27 | Disposition: A | Discharge status: Home / Self Care | Payer: Medicare HMO | Source: Ambulatory Visit | Attending: Radiation Oncology | Admitting: Radiation Oncology

## 2020-04-27 ENCOUNTER — Telehealth (INDEPENDENT_AMBULATORY_CARE_PROVIDER_SITE_OTHER): Payer: Medicare HMO | Admitting: Psychiatry

## 2020-04-27 ENCOUNTER — Other Ambulatory Visit: Payer: Self-pay

## 2020-04-27 ENCOUNTER — Encounter (HOSPITAL_COMMUNITY): Payer: Self-pay | Admitting: Psychiatry

## 2020-04-27 VITALS — Wt 290.0 lb

## 2020-04-27 DIAGNOSIS — Z510 Encounter for antineoplastic radiation therapy: Secondary | ICD-10-CM | POA: Diagnosis not present

## 2020-04-27 DIAGNOSIS — F330 Major depressive disorder, recurrent, mild: Secondary | ICD-10-CM | POA: Diagnosis not present

## 2020-04-27 DIAGNOSIS — Z170 Estrogen receptor positive status [ER+]: Secondary | ICD-10-CM | POA: Diagnosis not present

## 2020-04-27 DIAGNOSIS — C50412 Malignant neoplasm of upper-outer quadrant of left female breast: Secondary | ICD-10-CM | POA: Diagnosis not present

## 2020-04-27 DIAGNOSIS — F419 Anxiety disorder, unspecified: Secondary | ICD-10-CM

## 2020-04-27 MED ORDER — TRAZODONE HCL 100 MG PO TABS: 100.0000 mg | ORAL_TABLET | Freq: Every day | ORAL | 0 refills | Status: DC

## 2020-04-27 MED ORDER — DULOXETINE HCL 60 MG PO CPEP: 60.0000 mg | ORAL_CAPSULE | Freq: Every day | ORAL | 0 refills | Status: DC

## 2020-04-27 NOTE — Progress Notes (Signed)
Virtual Visit via Telephone Note  I connected with Robin Hardin on 04/27/20 at  2:00 PM EDT by telephone and verified that I am speaking with the correct person using two identifiers.  Location: Patient: home Provider: home office   I discussed the limitations, risks, security and privacy concerns of performing an evaluation and management service by telephone and the availability of in person appointments. I also discussed with the patient that there may be a patient responsible charge related to this service. The patient expressed understanding and agreed to proceed.   History of Present Illness: Patient is evaluated by phone session.  Recently she was diagnosed with breast cancer.  She had procedure and now she is going through radiation.  So far she is in good spirits.  She had a good support from her husband, children, pastor and pastor's wife.  She is sleeping okay.  Sometimes she gets fatigued and tired but denies any crying spells or any feeling of hopelessness or worthlessness.  Energy level is low.  She continues to drive especially on the weekends but sometimes she is tired and does not drive.  She goes to church regularly.  She is not interested in therapy.  She denies any panic attack.  She does not want to change medication because she feels Cymbalta and trazodone helping her anxiety and depression.  She has no tremors, shakes or any EPS.  Her last hemoglobin A1c was therapeutic.  Past Psychiatric History: H/O depression and anxiety. No h/oinpatient,psychosis,mania andsuicidal attempt.TookProzacbutquitworking.Wellbutrin, Rexulti, celexa, Ambien and abilify did not work.Remeronand Amitriptylinecaused weight gain. [place] not approved.   Recent Results (from the past 2160 hour(s))  Basic metabolic panel per protocol     Status: Abnormal   Collection Time: 03/03/20  1:44 PM  Result Value Ref Range   Sodium 142 135 - 145 mmol/L   Potassium 4.1 3.5 - 5.1 mmol/L    Chloride 106 98 - 111 mmol/L   CO2 26 22 - 32 mmol/L   Glucose, Bld 110 (H) 70 - 99 mg/dL    Comment: Glucose reference range applies only to samples taken after fasting for at least 8 hours.   BUN 35 (H) 8 - 23 mg/dL   Creatinine, Ser 1.58 (H) 0.44 - 1.00 mg/dL   Calcium 9.6 8.9 - 10.3 mg/dL   GFR calc non Af Amer 33 (L) >60 mL/min   GFR calc Af Amer 39 (L) >60 mL/min   Anion gap 10 5 - 15    Comment: Performed at [facility] [address], [place], [place] [address]  CBC per protocol     Status: Abnormal   Collection Time: 03/03/20  1:44 PM  Result Value Ref Range   WBC 3.7 (L) 4.0 - 10.5 K/uL   RBC 3.52 (L) 3.87 - 5.11 MIL/uL   Hemoglobin 10.8 (L) 12.0 - 15.0 g/dL   HCT 35.6 (L) 36 - 46 %   MCV 101.1 (H) 80.0 - 100.0 fL   MCH 30.7 26.0 - 34.0 pg   MCHC 30.3 30.0 - 36.0 g/dL   RDW 13.9 11.5 - 15.5 %   Platelets 141 (L) 150 - 400 K/uL   nRBC 0.0 0.0 - 0.2 %    Comment: Performed at [facility], [place] [address], [place], [place] [address]  SARS CORONAVIRUS 2 (TAT 6-24 HRS) Nasopharyngeal Nasopharyngeal Swab     Status: None   Collection Time: 03/07/20 11:12 AM     Result  Surgical pathology     Status: None   Collection Time: 03/10/20  3:02 PM  Result Value Ref Range           Glucose, capillary     Status: Abnormal   Collection Time: 03/10/20  8:57 PM  Result Value Ref Range   Glucose-Capillary 232 (H) 70 - 99 mg/dL    Comment: Glucose reference range applies only to samples taken after fasting for at least 8 hours.  Hemoglobin A1c     Status: None   Collection Time: 03/10/20 11:13 PM  Result Value Ref Range   Hgb A1c MFr Bld 5.5 4.8 - 5.6 %    Comment: (NOTE) Pre diabetes:          5.7%-6.4%  Diabetes:              >6.4%  Glycemic control for   <7.0% adults with diabetes    Mean Plasma Glucose 111.15 mg/dL    Comment: Performed at [facility] [address], [place], [place] [address]  Glucose, capillary     Status: Abnormal    Collection Time: 03/11/20  8:04 AM  Result Value Ref Range   Glucose-Capillary 110 (H) 70 - 99 mg/dL    Comment: Glucose reference range applies only to samples taken after fasting for at least 8 hours.  Glucose, capillary     Status: Abnormal   Collection Time: 03/11/20 12:03 PM  Result Value Ref Range   Glucose-Capillary 134 (H) 70 - 99 mg/dL    Comment: Glucose reference range applies only to samples taken after fasting for at least 8 hours.     Psychiatric Specialty Exam: Physical Exam  Review of Systems  Weight 290 lb (131.5 kg).There is no height or weight on file to calculate BMI.  General Appearance: NA  Eye Contact:  NA  Speech:  Slow  Volume:  Decreased  Mood:  Dysphoric  Affect:  NA  Thought Process:  Goal Directed  Orientation:  Full (Time, Place, and Person)  Thought Content:  Rumination  Suicidal Thoughts:  No  Homicidal Thoughts:  No  [place]:  Immediate;   Good Recent;   Fair Remote;   Good  Judgement:  Intact  Insight:  Present  Psychomotor Activity:  NA  Concentration:  Concentration: Fair and Attention Span: Fair  Recall:  Good  Fund of Knowledge:  Good  Language:  Good  Akathisia:  No  Handed:  Right  AIMS (if indicated):     Assets:  Communication Skills Desire for Improvement Housing Resilience Social Support Transportation  ADL's:  Intact  Cognition:  WNL  Sleep:   good      Assessment and Plan: Major depressive disorder, recurrent.  Anxiety.  I reviewed blood work results.  Patient is taking the diagnosis of breast cancer better than she anticipated.  She is compliant with treatment and currently doing radiation.  She also hoping to start wellness center she had a good support from the church, children and her husband.  She does not want to change medication.  She feels the current medicine is helping her anxiety.  We will continue trazodone 1 mg at bedtime and Cymbalta 60 mg daily.  I offered therapy but patient does not feel she  needed.  Discussed medication side effects and benefits.  Recommended to call us back if is any question or any concern.  Follow-up in 3 months.  Follow Up Instructions:    I discussed the assessment and treatment  plan with the patient. The patient was provided an opportunity to ask questions and all were answered. The patient agreed with the plan and demonstrated an understanding of the instructions.   The patient was advised to call back or seek an in-person evaluation if the symptoms worsen or if the condition fails to improve as anticipated.  I provided 13 minutes of non-face-to-face time during this encounter.   Kathlee Nations, MD

## 2020-04-28 ENCOUNTER — Ambulatory Visit
Admission: RE | Admit: 2020-04-28 | Discharge: 2020-04-28 | Disposition: A | Discharge status: Home / Self Care | Payer: Medicare HMO | Source: Ambulatory Visit | Attending: Radiation Oncology | Admitting: Radiation Oncology

## 2020-04-28 ENCOUNTER — Other Ambulatory Visit: Payer: Self-pay

## 2020-04-28 DIAGNOSIS — Z170 Estrogen receptor positive status [ER+]: Secondary | ICD-10-CM | POA: Diagnosis not present

## 2020-04-28 DIAGNOSIS — C50412 Malignant neoplasm of upper-outer quadrant of left female breast: Secondary | ICD-10-CM | POA: Diagnosis not present

## 2020-04-28 DIAGNOSIS — Z510 Encounter for antineoplastic radiation therapy: Secondary | ICD-10-CM | POA: Diagnosis not present

## 2020-04-29 ENCOUNTER — Other Ambulatory Visit: Payer: Self-pay

## 2020-04-29 ENCOUNTER — Ambulatory Visit
Admission: RE | Admit: 2020-04-29 | Discharge: 2020-04-29 | Disposition: A | Discharge status: Home / Self Care | Payer: Medicare HMO | Source: Ambulatory Visit | Attending: Radiation Oncology | Admitting: Radiation Oncology

## 2020-04-29 DIAGNOSIS — Z170 Estrogen receptor positive status [ER+]: Secondary | ICD-10-CM | POA: Diagnosis not present

## 2020-04-29 DIAGNOSIS — Z510 Encounter for antineoplastic radiation therapy: Secondary | ICD-10-CM | POA: Diagnosis not present

## 2020-04-29 DIAGNOSIS — C50412 Malignant neoplasm of upper-outer quadrant of left female breast: Secondary | ICD-10-CM | POA: Diagnosis not present

## 2020-05-02 ENCOUNTER — Ambulatory Visit
Admission: RE | Admit: 2020-05-02 | Discharge: 2020-05-02 | Disposition: A | Discharge status: Home / Self Care | Payer: Medicare HMO | Source: Ambulatory Visit | Attending: Radiation Oncology | Admitting: Radiation Oncology

## 2020-05-02 ENCOUNTER — Other Ambulatory Visit: Payer: Self-pay

## 2020-05-02 ENCOUNTER — Encounter (INDEPENDENT_AMBULATORY_CARE_PROVIDER_SITE_OTHER): Payer: Self-pay | Admitting: Family Medicine

## 2020-05-02 ENCOUNTER — Ambulatory Visit (INDEPENDENT_AMBULATORY_CARE_PROVIDER_SITE_OTHER): Payer: Medicare HMO | Admitting: Family Medicine

## 2020-05-02 VITALS — BP 132/82 | HR 56 | Temp 98.1°F | Ht 63.0 in | Wt 293.0 lb

## 2020-05-02 DIAGNOSIS — C50919 Malignant neoplasm of unspecified site of unspecified female breast: Secondary | ICD-10-CM

## 2020-05-02 DIAGNOSIS — I509 Heart failure, unspecified: Secondary | ICD-10-CM

## 2020-05-02 DIAGNOSIS — Z6841 Body Mass Index (BMI) 40.0 and over, adult: Secondary | ICD-10-CM

## 2020-05-02 DIAGNOSIS — Z170 Estrogen receptor positive status [ER+]: Secondary | ICD-10-CM | POA: Diagnosis not present

## 2020-05-02 DIAGNOSIS — Z510 Encounter for antineoplastic radiation therapy: Secondary | ICD-10-CM | POA: Diagnosis not present

## 2020-05-02 DIAGNOSIS — C50412 Malignant neoplasm of upper-outer quadrant of left female breast: Secondary | ICD-10-CM | POA: Diagnosis not present

## 2020-05-03 ENCOUNTER — Ambulatory Visit: Payer: Medicare HMO | Admitting: Radiation Oncology

## 2020-05-03 ENCOUNTER — Other Ambulatory Visit: Payer: Self-pay

## 2020-05-03 ENCOUNTER — Ambulatory Visit
Admission: RE | Admit: 2020-05-03 | Discharge: 2020-05-03 | Disposition: A | Discharge status: Home / Self Care | Payer: Medicare HMO | Source: Ambulatory Visit | Attending: Radiation Oncology | Admitting: Radiation Oncology

## 2020-05-03 DIAGNOSIS — C50412 Malignant neoplasm of upper-outer quadrant of left female breast: Secondary | ICD-10-CM | POA: Diagnosis not present

## 2020-05-03 DIAGNOSIS — Z170 Estrogen receptor positive status [ER+]: Secondary | ICD-10-CM | POA: Diagnosis not present

## 2020-05-03 DIAGNOSIS — Z510 Encounter for antineoplastic radiation therapy: Secondary | ICD-10-CM | POA: Diagnosis not present

## 2020-05-03 NOTE — Progress Notes (Signed)
Chief Complaint:   OBESITY Robin Hardin is here to discuss her progress with her obesity treatment plan along with follow-up of her obesity related diagnoses. Robin Hardin is on the Vegetarian Plan alternating with the [place] and states she is following her eating plan approximately 0% of the time. Robin Hardin states she is exercising for 0 minutes 0 times per week.  Today's visit was #: 6 Starting weight: 301 lbs Starting date: 12/15/2019 Today's weight: 293 lbs Today's date: 05/02/2020 Total lbs lost to date: 8 lbs Total lbs lost since last in-office visit: 0 Total weight loss percentage to date: -2.66%  Interim History: Robin Hardin is getting radiation therapy and will finish on 9/28.  She says this makes her extra tired.  She says she is still working on getting protein into her diet.  Robin Hardin provided the following food recall today: Breakfast:  Hardin, Robin Hardin, Robin Hardin. Will drink protein shake if needed. Fruit and 1 cookie at night.  Assessment/Plan:   1. Malignant neoplasm of female breast, undergoing radiation Focus on self-care, protein intake, and rest. Will continue to monitor symptoms as they relate to her weight loss journey. This issue directly impacts care plan for optimization of BMI and metabolic health as it impacts the patient's ability to make lifestyle changes.  2. Other congestive heart failure (HCC) Stable. Will continue to monitor symptoms as they relate to her weight loss journey.  BP Readings from Last 3 Encounters:  05/02/20 132/82  04/11/20 108/70  04/06/20 115/76   Lab Results  Component Value Date   CREATININE 1.58 (H) 03/03/2020   3. Class 3 severe obesity with serious comorbidity and body mass index (BMI) of 50.0 to 59.9 in adult, unspecified obesity type ([place]) Robin Hardin is currently in the action stage of change. As such, her goal is to continue with weight loss efforts. She has agreed to practicing portion control and making smarter food choices,  such as increasing vegetables and decreasing simple carbohydrates.   Exercise goals: As tolerated.  Behavioral modification strategies: increasing lean protein intake and increasing water intake.  Robin Hardin. She was informed of the importance of frequent follow-up visits to maximize her success with intensive lifestyle modifications for her multiple health conditions.   Objective:   Blood pressure 132/82, pulse (!) 56, temperature 98.1 F (36.7 C), temperature source Oral, height 5' 3" (1.6 m), weight 293 lb (132.9 kg), SpO2 99 %. Body mass index is 51.9 kg/m.  General: Cooperative, alert, well developed, in no acute distress. HEENT: Conjunctivae and lids unremarkable. Cardiovascular: Regular rhythm.  Lungs: Normal work of breathing. Neurologic: No focal deficits.   Lab Results  Component Value Date   CREATININE 1.58 (H) 03/03/2020   BUN 35 (H) 03/03/2020   NA 142 03/03/2020   K 4.1 03/03/2020   CL 106 03/03/2020   CO2 26 03/03/2020   Lab Results  Component Value Date   ALT 23 12/15/2019   AST 21 12/15/2019   ALKPHOS 72 12/15/2019   BILITOT 0.3 12/15/2019   Lab Results  Component Value Date   HGBA1C 5.5 03/10/2020   HGBA1C 5.3 12/15/2019   HGBA1C 5.3 07/29/2018   HGBA1C 5.5 11/08/2015   HGBA1C 5.1 03/31/2015   Lab Results  Component Value Date   INSULIN 6.9 12/15/2019   Lab Results  Component Value Date   TSH 1.500 12/15/2019   Lab Results  Component Value Date   CHOL 196 12/15/2019   HDL  68 12/15/2019   LDLCALC 114 (H) 12/15/2019   LDLDIRECT 160.6 06/14/2009   TRIG 78 12/15/2019   CHOLHDL 2.9 12/15/2019   Lab Results  Component Value Date   WBC 3.7 (L) 03/03/2020   HGB 10.8 (L) 03/03/2020   HCT 35.6 (L) 03/03/2020   MCV 101.1 (H) 03/03/2020   PLT 141 (L) 03/03/2020   Lab Results  Component Value Date   IRON 84 12/15/2019   TIBC 354 12/15/2019   FERRITIN 48 12/15/2019   Obesity Behavioral  Intervention:   Approximately 15 minutes were spent on the discussion below.  ASK: We discussed the diagnosis of obesity with Robin Hardin today and Robin Hardin agreed to give [place] permission to discuss obesity behavioral modification therapy today.  ASSESS: Robin Hardin has the diagnosis of obesity and her BMI today is 52.0. Robin Hardin is in the action stage of change.   ADVISE: Robin Hardin was educated on the multiple health risks of obesity as well as the benefit of weight loss to improve her health. She was advised of the need for long term treatment and the importance of lifestyle modifications to improve her current health and to decrease her risk of future health problems.  AGREE: Multiple dietary modification options and treatment options were discussed and Robin Hardin agreed to follow the recommendations documented in the above note.  ARRANGE: Robin Hardin was educated on the importance of frequent visits to treat obesity as outlined per CMS and USPSTF guidelines and agreed to schedule her next follow up appointment today.  Attestation Statements:   Reviewed by clinician on day of visit: allergies, medications, problem list, medical history, surgical history, family history, social history, and previous encounter notes.  I, Water quality scientist, CMA, am acting as transcriptionist for Briscoe Deutscher, DO  I have reviewed the above documentation for accuracy and completeness, and I agree with the above. Briscoe Deutscher, DO

## 2020-05-04 ENCOUNTER — Ambulatory Visit
Admission: RE | Admit: 2020-05-04 | Discharge: 2020-05-04 | Disposition: A | Discharge status: Home / Self Care | Payer: Medicare HMO | Source: Ambulatory Visit | Attending: Radiation Oncology | Admitting: Radiation Oncology

## 2020-05-04 ENCOUNTER — Other Ambulatory Visit: Payer: Self-pay

## 2020-05-04 DIAGNOSIS — C50412 Malignant neoplasm of upper-outer quadrant of left female breast: Secondary | ICD-10-CM | POA: Diagnosis not present

## 2020-05-04 DIAGNOSIS — Z510 Encounter for antineoplastic radiation therapy: Secondary | ICD-10-CM | POA: Diagnosis not present

## 2020-05-04 DIAGNOSIS — Z170 Estrogen receptor positive status [ER+]: Secondary | ICD-10-CM | POA: Diagnosis not present

## 2020-05-05 ENCOUNTER — Ambulatory Visit
Admission: RE | Admit: 2020-05-05 | Discharge: 2020-05-05 | Disposition: A | Discharge status: Home / Self Care | Payer: Medicare HMO | Source: Ambulatory Visit | Attending: Radiation Oncology | Admitting: Radiation Oncology

## 2020-05-05 DIAGNOSIS — Z170 Estrogen receptor positive status [ER+]: Secondary | ICD-10-CM | POA: Diagnosis not present

## 2020-05-05 DIAGNOSIS — Z510 Encounter for antineoplastic radiation therapy: Secondary | ICD-10-CM | POA: Diagnosis not present

## 2020-05-05 DIAGNOSIS — C50412 Malignant neoplasm of upper-outer quadrant of left female breast: Secondary | ICD-10-CM | POA: Diagnosis not present

## 2020-05-06 ENCOUNTER — Other Ambulatory Visit: Payer: Self-pay

## 2020-05-06 ENCOUNTER — Ambulatory Visit
Admission: RE | Admit: 2020-05-06 | Discharge: 2020-05-06 | Disposition: A | Discharge status: Home / Self Care | Payer: Medicare HMO | Source: Ambulatory Visit | Attending: Radiation Oncology | Admitting: Radiation Oncology

## 2020-05-06 DIAGNOSIS — Z510 Encounter for antineoplastic radiation therapy: Secondary | ICD-10-CM | POA: Diagnosis not present

## 2020-05-06 DIAGNOSIS — Z170 Estrogen receptor positive status [ER+]: Secondary | ICD-10-CM | POA: Diagnosis not present

## 2020-05-06 DIAGNOSIS — C50412 Malignant neoplasm of upper-outer quadrant of left female breast: Secondary | ICD-10-CM | POA: Diagnosis not present

## 2020-05-09 ENCOUNTER — Ambulatory Visit
Admission: RE | Admit: 2020-05-09 | Discharge: 2020-05-09 | Disposition: A | Discharge status: Home / Self Care | Payer: Medicare HMO | Source: Ambulatory Visit | Attending: Radiation Oncology | Admitting: Radiation Oncology

## 2020-05-09 ENCOUNTER — Encounter: Payer: Medicare HMO | Admitting: Physical Therapy

## 2020-05-09 ENCOUNTER — Other Ambulatory Visit: Payer: Self-pay

## 2020-05-09 DIAGNOSIS — Z510 Encounter for antineoplastic radiation therapy: Secondary | ICD-10-CM | POA: Diagnosis not present

## 2020-05-09 DIAGNOSIS — Z170 Estrogen receptor positive status [ER+]: Secondary | ICD-10-CM | POA: Diagnosis not present

## 2020-05-09 DIAGNOSIS — C50412 Malignant neoplasm of upper-outer quadrant of left female breast: Secondary | ICD-10-CM | POA: Diagnosis not present

## 2020-05-10 ENCOUNTER — Ambulatory Visit
Admission: RE | Admit: 2020-05-10 | Discharge: 2020-05-10 | Disposition: A | Discharge status: Home / Self Care | Payer: Medicare HMO | Source: Ambulatory Visit | Attending: Radiation Oncology | Admitting: Radiation Oncology

## 2020-05-10 ENCOUNTER — Other Ambulatory Visit: Payer: Self-pay

## 2020-05-10 DIAGNOSIS — Z170 Estrogen receptor positive status [ER+]: Secondary | ICD-10-CM | POA: Diagnosis not present

## 2020-05-10 DIAGNOSIS — C50412 Malignant neoplasm of upper-outer quadrant of left female breast: Secondary | ICD-10-CM | POA: Diagnosis not present

## 2020-05-10 DIAGNOSIS — Z510 Encounter for antineoplastic radiation therapy: Secondary | ICD-10-CM | POA: Diagnosis not present

## 2020-05-10 MED ORDER — SONAFINE EX EMUL
1.0000 "application " | Freq: Two times a day (BID) | CUTANEOUS | Status: DC
  Administered 2020-05-10: 1 via TOPICAL

## 2020-05-11 ENCOUNTER — Other Ambulatory Visit: Payer: Self-pay

## 2020-05-11 ENCOUNTER — Encounter: Payer: Medicare HMO | Admitting: Physical Therapy

## 2020-05-11 ENCOUNTER — Ambulatory Visit
Admission: RE | Admit: 2020-05-11 | Discharge: 2020-05-11 | Disposition: A | Discharge status: Home / Self Care | Payer: Medicare HMO | Source: Ambulatory Visit | Attending: Radiation Oncology | Admitting: Radiation Oncology

## 2020-05-11 DIAGNOSIS — Z170 Estrogen receptor positive status [ER+]: Secondary | ICD-10-CM | POA: Diagnosis not present

## 2020-05-11 DIAGNOSIS — C50412 Malignant neoplasm of upper-outer quadrant of left female breast: Secondary | ICD-10-CM | POA: Diagnosis not present

## 2020-05-11 DIAGNOSIS — Z510 Encounter for antineoplastic radiation therapy: Secondary | ICD-10-CM | POA: Diagnosis not present

## 2020-05-12 ENCOUNTER — Ambulatory Visit
Admission: RE | Admit: 2020-05-12 | Discharge: 2020-05-12 | Disposition: A | Discharge status: Home / Self Care | Payer: Medicare HMO | Source: Ambulatory Visit | Attending: Radiation Oncology | Admitting: Radiation Oncology

## 2020-05-12 DIAGNOSIS — Z510 Encounter for antineoplastic radiation therapy: Secondary | ICD-10-CM | POA: Diagnosis not present

## 2020-05-12 DIAGNOSIS — C50412 Malignant neoplasm of upper-outer quadrant of left female breast: Secondary | ICD-10-CM | POA: Diagnosis not present

## 2020-05-12 DIAGNOSIS — Z170 Estrogen receptor positive status [ER+]: Secondary | ICD-10-CM | POA: Diagnosis not present

## 2020-05-13 ENCOUNTER — Ambulatory Visit
Admission: RE | Admit: 2020-05-13 | Discharge: 2020-05-13 | Disposition: A | Discharge status: Home / Self Care | Payer: Medicare HMO | Source: Ambulatory Visit | Attending: Radiation Oncology | Admitting: Radiation Oncology

## 2020-05-13 DIAGNOSIS — Z510 Encounter for antineoplastic radiation therapy: Secondary | ICD-10-CM | POA: Diagnosis not present

## 2020-05-13 DIAGNOSIS — C50412 Malignant neoplasm of upper-outer quadrant of left female breast: Secondary | ICD-10-CM | POA: Diagnosis not present

## 2020-05-13 DIAGNOSIS — Z170 Estrogen receptor positive status [ER+]: Secondary | ICD-10-CM | POA: Diagnosis not present

## 2020-05-16 ENCOUNTER — Encounter: Payer: Self-pay | Admitting: *Deleted

## 2020-05-16 ENCOUNTER — Encounter: Payer: Medicare HMO | Admitting: Physical Therapy

## 2020-05-16 ENCOUNTER — Ambulatory Visit
Admission: RE | Admit: 2020-05-16 | Discharge: 2020-05-16 | Disposition: A | Discharge status: Home / Self Care | Payer: Medicare HMO | Source: Ambulatory Visit | Attending: Radiation Oncology | Admitting: Radiation Oncology

## 2020-05-16 ENCOUNTER — Other Ambulatory Visit: Payer: Self-pay | Admitting: Internal Medicine

## 2020-05-16 DIAGNOSIS — Z510 Encounter for antineoplastic radiation therapy: Secondary | ICD-10-CM | POA: Diagnosis not present

## 2020-05-16 DIAGNOSIS — C50412 Malignant neoplasm of upper-outer quadrant of left female breast: Secondary | ICD-10-CM | POA: Diagnosis not present

## 2020-05-16 DIAGNOSIS — Z170 Estrogen receptor positive status [ER+]: Secondary | ICD-10-CM | POA: Diagnosis not present

## 2020-05-16 DIAGNOSIS — Z6841 Body Mass Index (BMI) 40.0 and over, adult: Secondary | ICD-10-CM

## 2020-05-17 ENCOUNTER — Ambulatory Visit
Admission: RE | Admit: 2020-05-17 | Discharge: 2020-05-17 | Disposition: A | Discharge status: Home / Self Care | Payer: Medicare HMO | Source: Ambulatory Visit | Attending: Radiation Oncology | Admitting: Radiation Oncology

## 2020-05-17 ENCOUNTER — Encounter: Payer: Self-pay | Admitting: Radiation Oncology

## 2020-05-17 ENCOUNTER — Other Ambulatory Visit: Payer: Self-pay

## 2020-05-17 ENCOUNTER — Inpatient Hospital Stay: Payer: Medicare HMO | Attending: Hematology and Oncology | Admitting: Hematology and Oncology

## 2020-05-17 DIAGNOSIS — Z510 Encounter for antineoplastic radiation therapy: Secondary | ICD-10-CM | POA: Diagnosis not present

## 2020-05-17 DIAGNOSIS — Z170 Estrogen receptor positive status [ER+]: Secondary | ICD-10-CM | POA: Diagnosis not present

## 2020-05-17 DIAGNOSIS — C50412 Malignant neoplasm of upper-outer quadrant of left female breast: Secondary | ICD-10-CM | POA: Diagnosis not present

## 2020-05-17 NOTE — Assessment & Plan Note (Deleted)
03/10/2020:Left lumpectomy [name]): IDC, grade 1, 1.2cm, with intermediate grade DCIS, clear margins, 5 sentinel lymph nodes negative for carcinoma.  ER 95%, PR 95%, Ki-67 10%, HER-2 negative, T1CN0 stage Ia  Pathology counseling: I discussed the final pathology report of the patient provided  a copy of this report. I discussed the margins as well as lymph node surgeries. We also discussed the final staging along with previously performed ER/PR and HER-2/neu testing.  Oncotype DX score: 1: 3% chance of distant recurrence at 9 years  Treatment plan: 1. Adjuvant radiation therapy 04/19/2020-05/17/2020 2. Adjuvant antiestrogen therapy with anastrozole 1 mg daily to start 06/03/2020  Anastrozole counseling: We discussed the risks and benefits of anti-estrogen therapy with aromatase inhibitors. These include but not limited to insomnia, hot flashes, mood changes, vaginal dryness, bone density loss, and weight gain. We strongly believe that the benefits far outweigh the risks. Patient understands these risks and consented to starting treatment. Planned treatment duration is 5 years.  Return to clinic in 3 months for survivorship care plan visit

## 2020-05-18 ENCOUNTER — Encounter: Payer: Medicare HMO | Admitting: Physical Therapy

## 2020-05-19 ENCOUNTER — Ambulatory Visit (INDEPENDENT_AMBULATORY_CARE_PROVIDER_SITE_OTHER): Payer: Medicare HMO | Admitting: Family Medicine

## 2020-05-27 ENCOUNTER — Other Ambulatory Visit (HOSPITAL_COMMUNITY): Payer: Self-pay

## 2020-05-27 DIAGNOSIS — I5042 Chronic combined systolic (congestive) and diastolic (congestive) heart failure: Secondary | ICD-10-CM

## 2020-05-27 MED ORDER — FUROSEMIDE 40 MG PO TABS: 20.0000 mg | ORAL_TABLET | Freq: Every day | ORAL | 2 refills | Status: DC

## 2020-05-30 NOTE — Progress Notes (Signed)
Patient Care Team: Mosetta Anis, MD as PCP - General [place]well [place], RN as Oncology Nurse Navigator Mauro Kaufmann, RN as Oncology Nurse Navigator Coralie Keens, MD as Consulting Physician (General Surgery) Nicholas Lose, MD as Consulting Physician (Hematology and Oncology) Gery Pray, MD as Consulting Physician (Radiation Oncology)  DIAGNOSIS:    ICD-10-CM   1. Post-menopausal  Z78.0 DG Bone Density  2. Malignant neoplasm of upper-outer quadrant of left breast in female, estrogen receptor positive ([place])  C50.412    Z17.0     SUMMARY OF ONCOLOGIC HISTORY: Oncology History  Malignant neoplasm of upper-outer quadrant of left breast in female, estrogen receptor positive ([place])  02/15/2020 Initial Diagnosis   Screening mammogram showed a left breast mass and calcifications. Diagnostic mammogram and US showed a 1.3cm mass at the 3 o'clock position in the left breast, no axillary adenopathy. Biopsy showed IDC with DCIS, grade 2, HER-2 - (1+), ER+ 95%, PR+ 95%, Ki67 10%.   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   03/10/2020 Cancer Staging   Staging form: Breast, AJCC 8th Edition - Pathologic stage from 03/10/2020: Stage IA (pT1b, pN0, cM0, G1, ER+, PR+, HER2-) - Signed by Gardenia Phlegm, NP on 03/23/2020   03/23/2020 Oncotype testing   Oncotype: score of 1 with a 3% chance of distant recurrence in 9 years on tamoxifen alone.   04/19/2020 - 05/17/2020 Radiation Therapy   Adjuvant radiation therapy     CHIEF COMPLIANT: Follow-up to discuss antiestrogen therapy  INTERVAL HISTORY: Robin Hardin is a 69 y.o. with above-mentioned history of left breast cancer who underwent a left lumpectomy and completed radiation on 05/17/20. She presents to the clinic today to discuss antiestrogen therapy.  Patient is in a wheelchair and tells me that she is very  tired and does not do much activities at home.  She is able to get around the house without assistance.  She cannot walk for any length of time.  ALLERGIES:  is allergic to abilify [aripiprazole].  MEDICATIONS:  Current Outpatient Medications  Medication Sig Dispense Refill  . allopurinol (ZYLOPRIM) 300 MG tablet TAKE 1 TABLET EVERY DAY 90 tablet 3  . ALOE PO Take 1 capsule by mouth 2 (two) times daily.    Marland Kitchen anastrozole (ARIMIDEX) 1 MG tablet Take 1 tablet (1 mg total) by mouth daily. 90 tablet 3  . aspirin 81 MG EC tablet Take 1 tablet (81 mg total) by mouth daily. 90 tablet 3  . Biotin w/ Vitamins C & E (HAIR SKIN & NAILS GUMMIES PO) Take 1 Dose by mouth daily.    . carvedilol (COREG) 25 MG tablet TAKE 1 TABLET TWICE DAILY WITH MEALS 180 tablet 1  . DULoxetine (CYMBALTA) 60 MG capsule Take 1 capsule (60 mg total) by mouth daily. 90 capsule 0  . furosemide (LASIX) 40 MG tablet Take 0.5 tablets (20 mg total) by mouth daily. 15 tablet 2  . Ginkgo Biloba 40 MG TABS Take 1 tablet by mouth daily.    . metFORMIN (GLUCOPHAGE) 500 MG tablet TAKE 1 TABLET TWICE DAILY WITH MEALS 180 tablet 1  . methocarbamol (ROBAXIN) 500 MG tablet TAKE 1 TABLET (500 MG TOTAL) BY MOUTH EVERY 8 (EIGHT) HOURS AS NEEDED FOR MUSCLE SPASMS. 60 tablet 2  . Misc Natural Products (APPLE CIDER VINEGAR DIET PO) Take 1 tablet by mouth daily.    Marland Kitchen OVER THE  COUNTER MEDICATION Take 1 Dose by mouth daily. Fennel    . potassium chloride SA (KLOR-CON) 20 MEQ tablet Take 1 tablet (20 mEq total) by mouth daily. 90 tablet 3  . rosuvastatin (CRESTOR) 20 MG tablet TAKE 1 TABLET EVERY DAY 90 tablet 1  . sacubitril-valsartan (ENTRESTO) 49-51 MG Take 1 tablet by mouth 2 (two) times daily. 180 tablet 3  . spironolactone (ALDACTONE) 25 MG tablet TAKE 1 TABLET EVERY DAY 90 tablet 3  . traZODone (DESYREL) 100 MG tablet Take 1 tablet (100 mg total) by mouth at bedtime. 90 tablet 0  . Vitamin D, Ergocalciferol, (DRISDOL) 1.25 MG (50000 UNIT) CAPS  capsule Take 1 capsule (50,000 Units total) by mouth every 7 (seven) days. 4 capsule 0   No current facility-administered medications for this visit.    PHYSICAL EXAMINATION: ECOG PERFORMANCE STATUS: 2 - Symptomatic, <50% confined to bed  Vitals:   05/31/20 1432  BP: 108/73  Pulse: 65  Resp: 17  Temp: 98.7 F (37.1 C)  SpO2: 96%   Filed Weights   05/31/20 1432  Weight: 296 lb 12.8 oz (134.6 kg)      LABORATORY DATA:  I have reviewed the data as listed CMP Latest Ref Rng & Units 03/03/2020 01/04/2020 12/15/2019  Glucose 70 - 99 mg/dL 110(H) 89 86  BUN 8 - 23 mg/dL 35(H) 54(H) 31(H)  Creatinine 0.44 - 1.00 mg/dL 1.58(H) 1.76(H) 1.56(H)  Sodium 135 - 145 mmol/L 142 144 142  Potassium 3.5 - 5.1 mmol/L 4.1 5.0 4.3  Chloride 98 - 111 mmol/L 106 107(H) 104  CO2 22 - 32 mmol/L _0 Calcium 8.9 - 10.3 mg/dL 9.6 10.0 9.8  Total Protein 6.0 - 8.5 g/dL - - 7.1  Total Bilirubin 0.0 - 1.2 mg/dL - - 0.3  Alkaline Phos 39 - 117 IU/L - - 72  AST 0 - 40 IU/L - - 21  ALT 0 - 32 IU/L - - 23    Lab Results  Component Value Date   WBC 3.7 (L) 03/03/2020   HGB 10.8 (L) 03/03/2020   HCT 35.6 (L) 03/03/2020   MCV 101.1 (H) 03/03/2020   PLT 141 (L) 03/03/2020   NEUTROABS 1.3 (L) 01/04/2020    ASSESSMENT & PLAN:  Malignant neoplasm of upper-outer quadrant of left breast in female, estrogen receptor positive ([place]) 03/10/2020:Left lumpectomy [name]): IDC, grade 1, 1.2cm, with intermediate grade DCIS, clear margins, 5 sentinel lymph nodes negative for carcinoma.  ER 95%, PR 95%, Ki-67 10%, HER-2 negative, T1CN0 stage Ia  Pathology counseling: I discussed the final pathology report of the patient provided  a copy of this report. I discussed the margins as well as lymph node surgeries. We also discussed the final staging along with previously performed ER/PR and HER-2/neu testing.  Oncotype DX score: 1: 3% chance of distant recurrence at 9 years Adjuvant radiation therapy completed  05/17/2020  Treatment plan: Adjuvant antiestrogen therapy with anastrozole 1 mg daily x5 to 7 years Anastrozole counseling:We discussed the risks and benefits of anti-estrogen therapy with aromatase inhibitors. These include but not limited to insomnia, hot flashes, mood changes, vaginal dryness, bone density loss, and weight gain. We strongly believe that the benefits far outweigh the risks. Patient understands these risks and consented to starting treatment. Planned treatment duration is 5 years.  Wheelchair-bound and poor performance status with fatigue: I encouraged her to stay more active and exercise as much as possible improving short walks within her house.  Return to clinic in  3 months for survivorship care plan visit.    Orders Placed This Encounter  Procedures  . DG Bone Density    Standing Status:   Future    Standing Expiration Date:   05/31/2021    Order Specific Question:   Reason for Exam (SYMPTOM  OR DIAGNOSIS REQUIRED)    Answer:   Osteoporosis evaluation    Order Specific Question:   Preferred imaging location?    Answer:   [facility]    Order Specific Question:   Release to patient    Answer:   Immediate   The patient has a good understanding of the overall plan. she agrees with it. she will call with any problems that may develop before the next visit here.  Total time spent: 30 mins including face to face time and time spent for planning, charting and coordination of care  Nicholas Lose, MD 05/31/2020  I, Cloyde Reams Dorshimer, am acting as scribe for Dr. Nicholas Lose.  I have reviewed the above documentation for accuracy and completeness, and I agree with the above.

## 2020-05-31 ENCOUNTER — Other Ambulatory Visit: Payer: Self-pay

## 2020-05-31 ENCOUNTER — Inpatient Hospital Stay: Payer: Medicare HMO | Attending: Hematology and Oncology | Admitting: Hematology and Oncology

## 2020-05-31 VITALS — BP 108/73 | HR 65 | Temp 98.7°F | Resp 17 | Ht 63.0 in | Wt 296.8 lb

## 2020-05-31 DIAGNOSIS — R5383 Other fatigue: Secondary | ICD-10-CM | POA: Insufficient documentation

## 2020-05-31 DIAGNOSIS — Z780 Asymptomatic menopausal state: Secondary | ICD-10-CM | POA: Diagnosis not present

## 2020-05-31 DIAGNOSIS — Z923 Personal history of irradiation: Secondary | ICD-10-CM | POA: Diagnosis not present

## 2020-05-31 DIAGNOSIS — C50412 Malignant neoplasm of upper-outer quadrant of left female breast: Secondary | ICD-10-CM | POA: Insufficient documentation

## 2020-05-31 DIAGNOSIS — Z79899 Other long term (current) drug therapy: Secondary | ICD-10-CM | POA: Insufficient documentation

## 2020-05-31 DIAGNOSIS — Z170 Estrogen receptor positive status [ER+]: Secondary | ICD-10-CM | POA: Insufficient documentation

## 2020-05-31 MED ORDER — ANASTROZOLE 1 MG PO TABS
1.0000 mg | ORAL_TABLET | Freq: Every day | ORAL | 3 refills | Status: DC
Start: 2020-05-31 — End: 2020-09-01

## 2020-05-31 NOTE — Assessment & Plan Note (Signed)
03/10/2020:Left lumpectomy [name]): IDC, grade 1, 1.2cm, with intermediate grade DCIS, clear margins, 5 sentinel lymph nodes negative for carcinoma.  ER 95%, PR 95%, Ki-67 10%, HER-2 negative, T1CN0 stage Ia  Pathology counseling: I discussed the final pathology report of the patient provided  a copy of this report. I discussed the margins as well as lymph node surgeries. We also discussed the final staging along with previously performed ER/PR and HER-2/neu testing.  Oncotype DX score: 1: 3% chance of distant recurrence at 9 years Adjuvant radiation therapy completed 05/17/2020  Treatment plan: Adjuvant antiestrogen therapy with anastrozole 1 mg daily x5 to 7 years  Return to clinic in 3 months for survivorship care plan visit.

## 2020-06-16 ENCOUNTER — Telehealth: Payer: Self-pay | Admitting: *Deleted

## 2020-06-16 NOTE — Telephone Encounter (Signed)
Called patient to alter fu appt. On 06-20-20 due to Dr. Sondra Come being in the [place], rescheduled for 11:15 am on 06-20-20 , patient agreed to new time

## 2020-06-19 NOTE — Progress Notes (Incomplete)
  Patient Name: Robin Hardin MRN: [id number] DOB: 10-May-1951 Referring Physician: Coralie Keens (Profile Not Attached) Date of Service: 05/17/2020  [facility]                                                        End Of Treatment Note  Diagnoses: C50.412-Malignant neoplasm of upper-outer quadrant of left female breast  Cancer Staging: Stage IA (pT1c, pN0, cM0), LeftBreastOUQ,Invasive DuctalCarcinoma with intermediate grade DCIS, ER+/ PR+/ Her2-, Grade1   Intent: Curative  Radiation Treatment Dates: 04/18/2020 through 05/17/2020 Site Technique Total Dose (Gy) Dose per Fx (Gy) Completed Fx Beam Energies  Breast, Left: Breast_Lt 3D 40.05/40.05 2.67 15/15 6X, 10X  Breast, Left: Breast_Lt_Bst 3D 12/12 2 6/6 6X, 10X   Narrative: The patient tolerated radiation therapy relatively well. She did report an occasional pain in her left upper arm and mild fatigue. Throughout treatment, the patient was noted to have some mild hyperpigmentation changes to the left breast. As treatment continued, the most significant hyperpigmentation changes were noted to be along the upper left breast and axillary region with a small amount of breakdown in the low axillary region and inframammary fold. The patient was advised to use triple antibiotic ointment to the areas of skin breakdown.  Plan: The patient will follow-up with radiation oncology in one month or sooner if skin condition worsens.  ________________________________________________   Blair Promise, PhD, MD  This document serves as a record of services personally performed by Gery Pray, MD. It was created on his behalf by Clerance Lav, a trained medical scribe. The creation of this record is based on the scribe's personal observations and the provider's  statements to them. This document has been checked and approved by the attending provider.

## 2020-06-19 NOTE — Progress Notes (Signed)
Radiation Oncology         [phone] ________________________________  Name: Robin Hardin MRN: [id number]  Date: 06/20/2020  DOB: [birth year]  Follow-Up Visit Note  CC: Mosetta Anis, MD  Coralie Keens, MD    ICD-10-CM   1. Malignant neoplasm of upper-outer quadrant of left breast in female, estrogen receptor positive ([place])  C50.412    Z17.0      Diagnosis: Stage IA (pT1c, pN0, cM0), LeftBreastOUQ,Invasive DuctalCarcinoma with intermediate grade DCIS, ER+/ PR+/ Her2-, Grade1   Interval Since Last Radiation: One month and four days  Radiation Treatment Dates: 04/18/2020 through 05/17/2020 Site Technique Total Dose (Gy) Dose per Fx (Gy) Completed Fx Beam Energies  Breast, Left: Breast_Lt 3D 40.05/40.05 2.67 15/15 6X, 10X  Breast, Left: Breast_Lt_Bst 3D 12/12 2 6/6 6X, 10X    Narrative:  The patient returns today for routine follow-up. Since the end of treatment, she was seen by Dr. Lindi Adie on 05/31/2020. She was started on adjuvant antiestrogen therapy with Anastrozole for 5-7 years.  On review of systems, she reports having significant side effects with the Arimidex.  She reports night sweats as well as generalized fatigue and difficulty with concentration.  I recommended she discuss this with Dr. Lindi Adie if the symptoms continue. She denies poor appetite and breast pain.                  ALLERGIES:  is allergic to abilify [aripiprazole].  Meds: Current Outpatient Medications  Medication Sig Dispense Refill  . allopurinol (ZYLOPRIM) 300 MG tablet TAKE 1 TABLET EVERY DAY 90 tablet 3  . ALOE PO Take 1 capsule by mouth 2 (two) times daily.    Marland Kitchen anastrozole (ARIMIDEX) 1 MG tablet Take 1 tablet (1 mg total) by mouth daily. 90 tablet 3  . aspirin 81 MG EC tablet Take 1 tablet (81 mg total) by mouth daily. 90 tablet 3  . Biotin w/ Vitamins C & E (HAIR SKIN & NAILS GUMMIES PO) Take 1 Dose by mouth daily.    . carvedilol (COREG) 25 MG tablet TAKE 1 TABLET TWICE DAILY  WITH MEALS 180 tablet 1  . DULoxetine (CYMBALTA) 60 MG capsule Take 1 capsule (60 mg total) by mouth daily. 90 capsule 0  . furosemide (LASIX) 40 MG tablet Take 0.5 tablets (20 mg total) by mouth daily. 15 tablet 2  . Ginkgo Biloba 40 MG TABS Take 1 tablet by mouth daily.    . metFORMIN (GLUCOPHAGE) 500 MG tablet TAKE 1 TABLET TWICE DAILY WITH MEALS 180 tablet 1  . methocarbamol (ROBAXIN) 500 MG tablet TAKE 1 TABLET (500 MG TOTAL) BY MOUTH EVERY 8 (EIGHT) HOURS AS NEEDED FOR MUSCLE SPASMS. 60 tablet 2  . Misc Natural Products (APPLE CIDER VINEGAR DIET PO) Take 1 tablet by mouth daily.    Marland Kitchen OVER THE COUNTER MEDICATION Take 1 Dose by mouth daily. Fennel    . potassium chloride SA (KLOR-CON) 20 MEQ tablet Take 1 tablet (20 mEq total) by mouth daily. 90 tablet 3  . rosuvastatin (CRESTOR) 20 MG tablet TAKE 1 TABLET EVERY DAY 90 tablet 1  . sacubitril-valsartan (ENTRESTO) 49-51 MG Take 1 tablet by mouth 2 (two) times daily. 180 tablet 3  . spironolactone (ALDACTONE) 25 MG tablet TAKE 1 TABLET EVERY DAY 90 tablet 3  . traZODone (DESYREL) 100 MG tablet Take 1 tablet (100 mg total) by mouth at bedtime. 90 tablet 0  . Vitamin D, Ergocalciferol, (DRISDOL) 1.25 MG (50000 UNIT) CAPS capsule Take 1 capsule (  50,000 Units total) by mouth every 7 (seven) days. 4 capsule 0   No current facility-administered medications for this encounter.    Physical Findings: The patient is in no acute distress. Patient is alert and oriented.  weight is 291 lb 6.4 oz (132.2 kg). Her temperature is 98.2 F (36.8 C). Her blood pressure is 105/71 and her pulse is 62. Her respiration is 20 and oxygen saturation is 99%.  No significant changes. Lungs are clear to auscultation bilaterally. Heart has regular rate and rhythm. No palpable cervical, supraclavicular, or axillary adenopathy. Abdomen soft, non-tender, normal bowel sounds.  Left breast: Skin well-healed some hyperpigmentation changes.  No dominant mass appreciated breast  nipple discharge or bleeding  Lab Findings: Lab Results  Component Value Date   WBC 3.7 (L) 03/03/2020   HGB 10.8 (L) 03/03/2020   HCT 35.6 (L) 03/03/2020   MCV 101.1 (H) 03/03/2020   PLT 141 (L) 03/03/2020    Radiographic Findings: No results found.  Impression:  The patient is recovering from the effects of radiation.  She reports no lingering side effects from her radiation therapy.  No discomfort or pain within the breast  Plan: The patient is scheduled to follow up with Wilber Bihari, NP, on 09/01/2020. She will follow up with radiation oncology in as needed basis in light of her close follow-up with medical oncology.    ____________________________________   Blair Promise, PhD, MD  This document serves as a record of services personally performed by Gery Pray, MD. It was created on his behalf by Clerance Lav, a trained medical scribe. The creation of this record is based on the scribe's personal observations and the provider's statements to them. This document has been checked and approved by the attending provider.

## 2020-06-20 ENCOUNTER — Encounter: Payer: Self-pay | Admitting: Radiation Oncology

## 2020-06-20 ENCOUNTER — Other Ambulatory Visit: Payer: Self-pay

## 2020-06-20 ENCOUNTER — Ambulatory Visit
Admission: RE | Admit: 2020-06-20 | Discharge: 2020-06-20 | Disposition: A | Discharge status: Home / Self Care | Payer: Medicare HMO | Source: Ambulatory Visit | Attending: Radiation Oncology | Admitting: Radiation Oncology

## 2020-06-20 DIAGNOSIS — Z923 Personal history of irradiation: Secondary | ICD-10-CM | POA: Diagnosis not present

## 2020-06-20 DIAGNOSIS — R5383 Other fatigue: Secondary | ICD-10-CM | POA: Diagnosis not present

## 2020-06-20 DIAGNOSIS — Z170 Estrogen receptor positive status [ER+]: Secondary | ICD-10-CM | POA: Diagnosis not present

## 2020-06-20 DIAGNOSIS — R61 Generalized hyperhidrosis: Secondary | ICD-10-CM | POA: Insufficient documentation

## 2020-06-20 DIAGNOSIS — C50412 Malignant neoplasm of upper-outer quadrant of left female breast: Secondary | ICD-10-CM | POA: Insufficient documentation

## 2020-06-20 DIAGNOSIS — Z7984 Long term (current) use of oral hypoglycemic drugs: Secondary | ICD-10-CM | POA: Diagnosis not present

## 2020-06-20 DIAGNOSIS — Z79899 Other long term (current) drug therapy: Secondary | ICD-10-CM | POA: Insufficient documentation

## 2020-06-20 NOTE — Progress Notes (Signed)
Patient here for a 1 month f/u visit with Dr. Sondra Come. Patient denies any problems with her left breast. Appetite normal.  BP 105/71 (BP Location: Right Wrist, Patient Position: Sitting, Cuff Size: Normal)   Pulse 62   Temp 98.2 F (36.8 C)   Resp 20   Wt 291 lb 6.4 oz (132.2 kg)   SpO2 99%   BMI 51.62 kg/m   Wt Readings from Last 3 Encounters:  06/20/20 291 lb 6.4 oz (132.2 kg)  05/31/20 296 lb 12.8 oz (134.6 kg)  05/02/20 293 lb (132.9 kg)

## 2020-06-21 IMAGING — US US BREAST*L* LIMITED INC AXILLA
1 series · 10 of 10 positions shown · non-contrast
Comparison: 01/21/2020, 10/30/2012

CLINICAL DATA: Patient returns after screening study for evaluation
of a possible LEFT breast mass.

EXAM:
DIGITAL DIAGNOSTIC LEFT MAMMOGRAM WITH CAD AND TOMO
ULTRASOUND LEFT BREAST

[Series 1: us breast*left* limited inc axilla · 0.06mm/px · 10 of 10 slices shown]
[im 1/10]
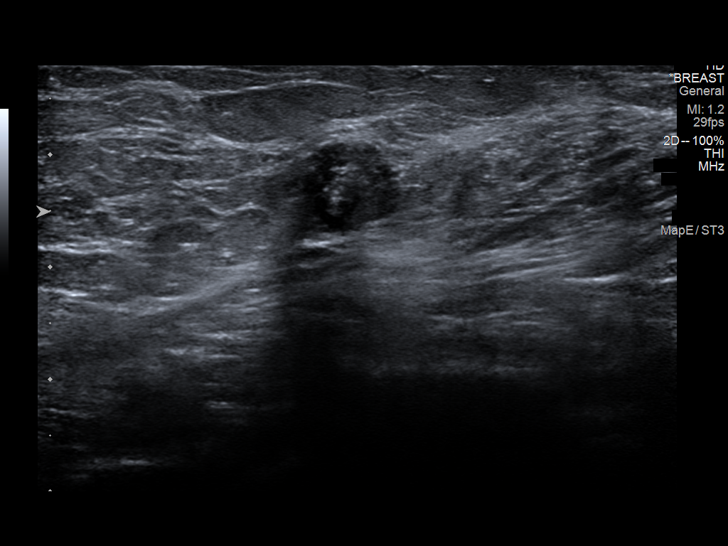
[im 2/10]
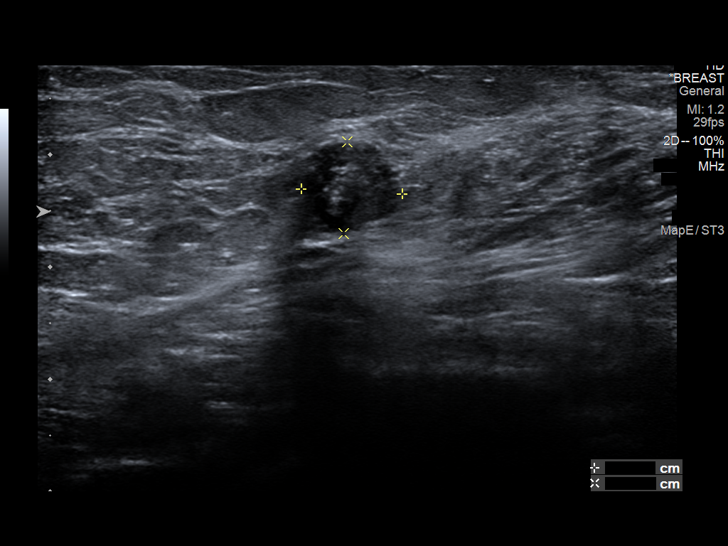
[im 3/10]
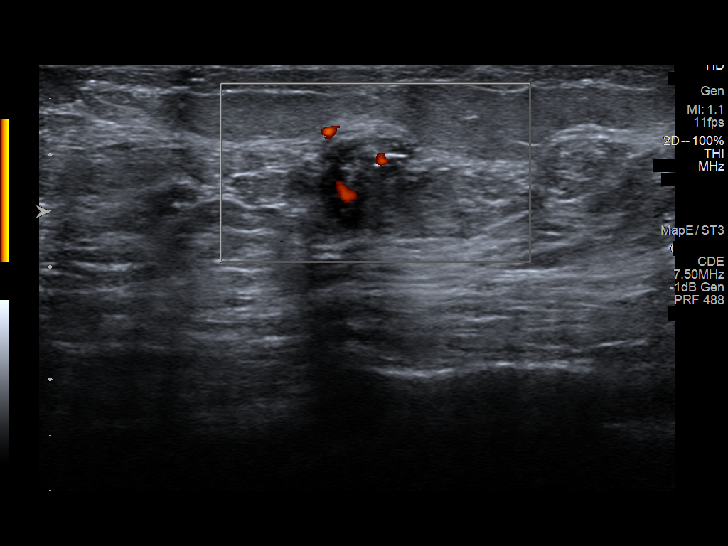
[im 4/10]
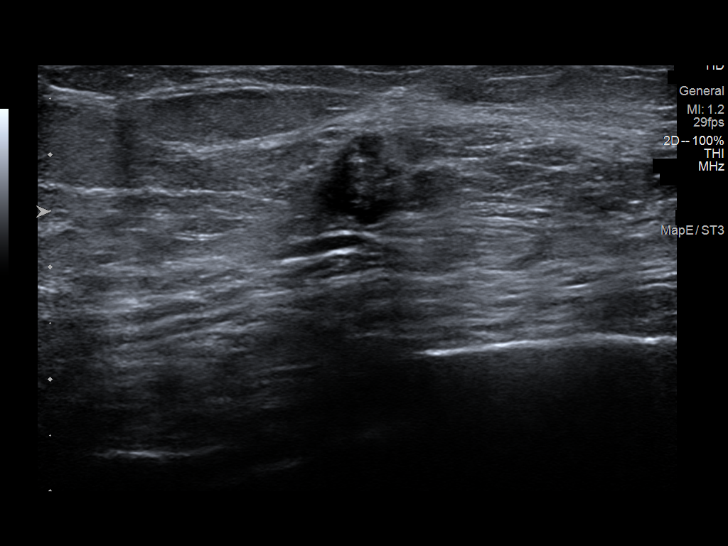
[im 5/10]
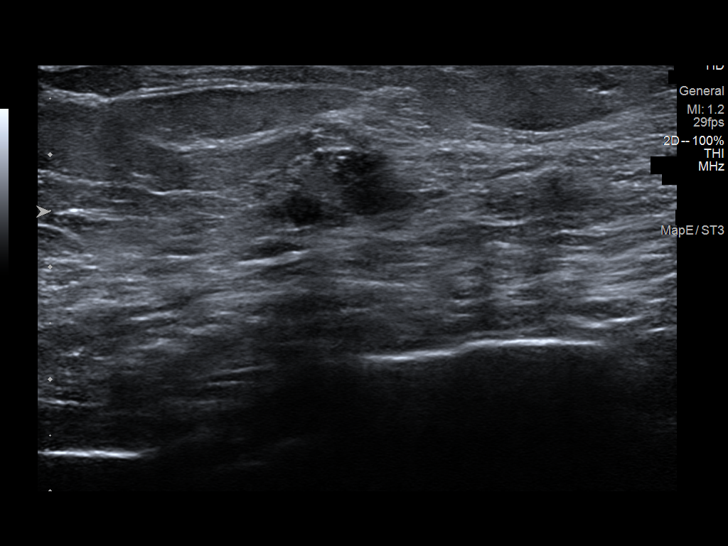
[im 6/10]
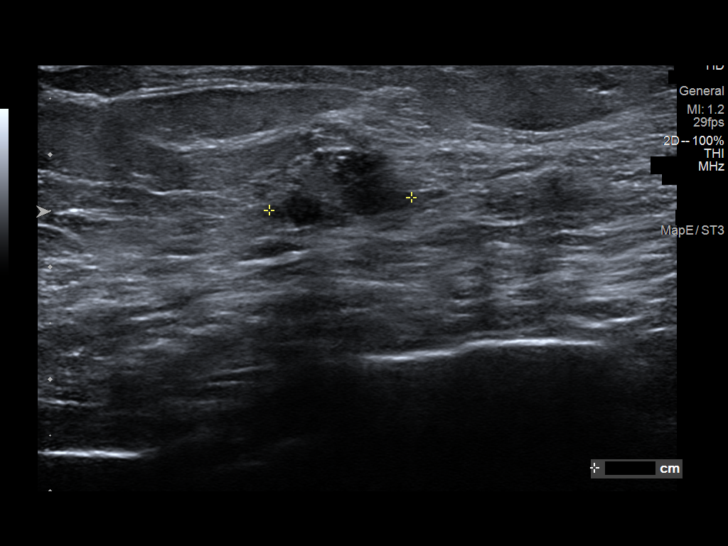
[im 7/10]
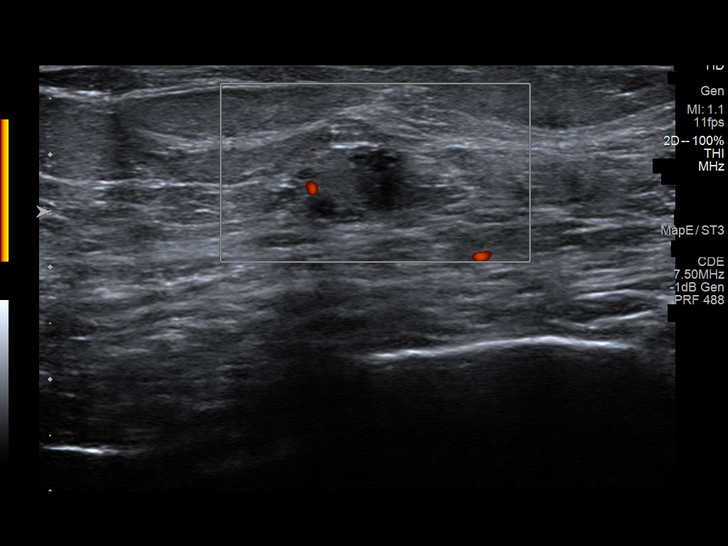
[im 8/10]
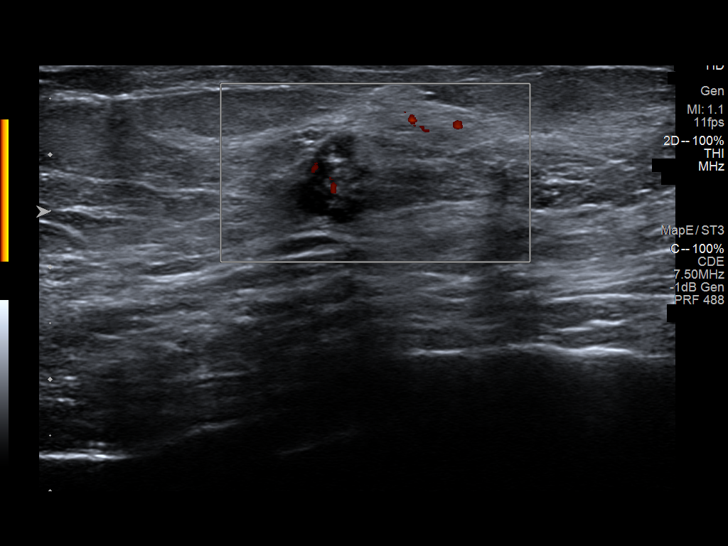
[im 9/10]
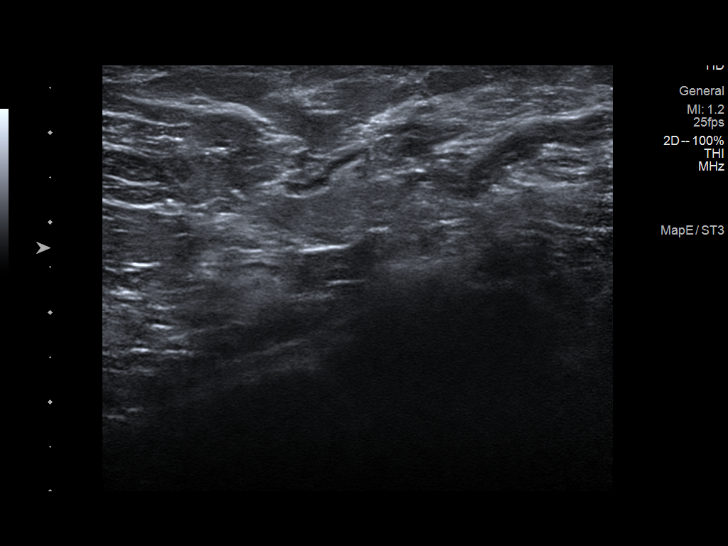
[im 10/10]
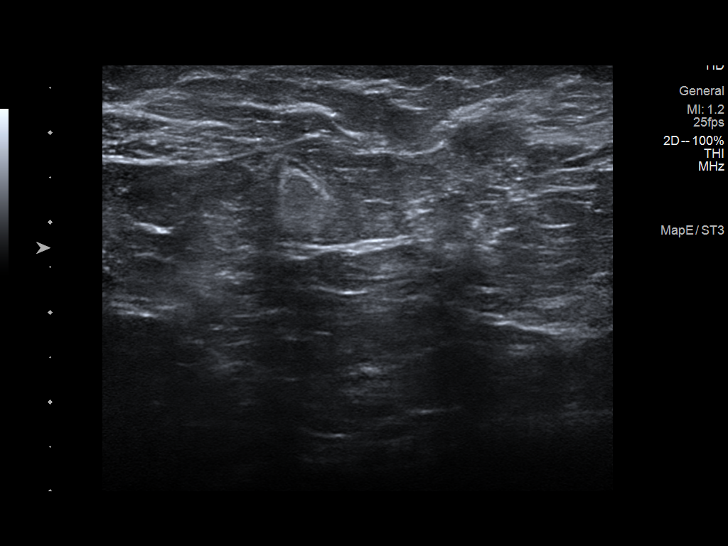

[10 of 10 positions shown; findings below may reference images not displayed]

ACR Breast Density Category b: There are scattered areas of
fibroglandular density.
FINDINGS: Additional 2-D and 3-D images are performed. These views confirm
presence of an irregular mass in the LATERAL portion of the LEFT
breast, associated with microcalcifications.

Mammographic images were processed with CAD.

On physical exam, I palpate no abnormality in the LATERAL portion of
the LEFT breast.

Targeted ultrasound is performed, showing an irregular hypoechoic
mass with internal calcifications in the 3 o'clock location of the
LEFT breast 6 centimeters the nipple which measures 0.9 x 0.8 x
centimeters. Internal blood flow is present on Doppler evaluation.

Evaluation of the LEFT axilla is negative for adenopathy.
IMPRESSION: Suspicious mass in the 3 o'clock location of the LEFT breast.

RECOMMENDATION:
Ultrasound-guided core biopsy LEFT breast mass

I have discussed the findings and recommendations with the patient.
If applicable, a reminder letter will be sent to the patient
regarding the next appointment.

BI-RADS CATEGORY  4: Suspicious.

## 2020-07-07 ENCOUNTER — Encounter (INDEPENDENT_AMBULATORY_CARE_PROVIDER_SITE_OTHER): Payer: Self-pay | Admitting: Family Medicine

## 2020-07-07 ENCOUNTER — Other Ambulatory Visit: Payer: Self-pay

## 2020-07-07 ENCOUNTER — Ambulatory Visit (INDEPENDENT_AMBULATORY_CARE_PROVIDER_SITE_OTHER): Payer: Medicare HMO | Admitting: Family Medicine

## 2020-07-07 VITALS — BP 110/69 | HR 57 | Temp 98.3°F | Ht 63.0 in | Wt 292.0 lb

## 2020-07-07 DIAGNOSIS — I509 Heart failure, unspecified: Secondary | ICD-10-CM

## 2020-07-07 DIAGNOSIS — Z6841 Body Mass Index (BMI) 40.0 and over, adult: Secondary | ICD-10-CM | POA: Diagnosis not present

## 2020-07-07 DIAGNOSIS — Z7409 Other reduced mobility: Secondary | ICD-10-CM

## 2020-07-07 DIAGNOSIS — Z170 Estrogen receptor positive status [ER+]: Secondary | ICD-10-CM | POA: Diagnosis not present

## 2020-07-07 DIAGNOSIS — R6889 Other general symptoms and signs: Secondary | ICD-10-CM | POA: Diagnosis not present

## 2020-07-07 DIAGNOSIS — F3289 Other specified depressive episodes: Secondary | ICD-10-CM | POA: Diagnosis not present

## 2020-07-07 DIAGNOSIS — C50412 Malignant neoplasm of upper-outer quadrant of left female breast: Secondary | ICD-10-CM | POA: Diagnosis not present

## 2020-07-10 ENCOUNTER — Other Ambulatory Visit (HOSPITAL_COMMUNITY): Payer: Self-pay | Admitting: Psychiatry

## 2020-07-10 DIAGNOSIS — F419 Anxiety disorder, unspecified: Secondary | ICD-10-CM

## 2020-07-10 DIAGNOSIS — F330 Major depressive disorder, recurrent, mild: Secondary | ICD-10-CM

## 2020-07-12 NOTE — Progress Notes (Signed)
Chief Complaint:   OBESITY Robin Hardin is here to discuss her progress with her obesity treatment plan along with follow-up of her obesity related diagnoses.   Today's visit was #: 7 Starting weight: 301 lbs Starting date: 12/15/2019 Today's weight: 292 lbs Today's date: 07/07/2020 Total lbs lost to date: 9 lbs Body mass index is 51.73 kg/m.  Total weight loss percentage to date: -2.99%  Interim History: Robin Hardin says she finished radiation and is now on Arimidex.  She is feeling fatigued.  She feels that her anxiety has increased as well.  Her brother died last week.  She is still the caregiver for her husband. Nutrition Plan: the [place].  Anti-obesity medications: metformin. Reported side effects: None. Activity: None at this time. Stress: Elevated.  Assessment/Plan:   1. Malignant neoplasm of upper-outer quadrant of left breast in female, estrogen receptor positive ([place]) Robin Hardin is now taking Arimidex 1 mg daily.  She is in need of resources.  We discussed survivorship information.  2. Mobility impaired We have discussed PT today.  3. Congestive heart failure (HCC) Stable. Will continue to monitor symptoms as they relate to her weight loss journey.  BP Readings from Last 3 Encounters:  07/07/20 110/69  06/20/20 105/71  05/31/20 108/73   4. Other depression Robin Hardin is struggling with emotional eating and using food for comfort to the extent that it is negatively impacting her health. She has been working on behavior modification techniques to help reduce her emotional eating and has been minimally successful. She shows no sign of suicidal or homicidal ideations.   5. Class 3 severe obesity with serious comorbidity and body mass index (BMI) of 50.0 to 59.9 in adult, unspecified obesity type (Robin Hardin)  Course: [name] is currently in the action stage of change. As such, her goal is to continue with weight loss efforts.   Nutrition goals: She has agreed to the  [place].   Exercise goals: Older adults should follow the adult guidelines. When older adults cannot meet the adult guidelines, they should be as physically active as their abilities and conditions will allow.  Older adults should do exercises that maintain or improve balance if they are at risk of falling.  We discussed future PT.  Behavioral modification strategies: increasing lean protein intake, decreasing simple carbohydrates, increasing vegetables and increasing water intake.  Robin Hardin has agreed to follow-up with our clinic in 3 weeks. She was informed of the importance of frequent follow-up visits to maximize her success with intensive lifestyle modifications for her multiple health conditions.   Objective:   Blood pressure 110/69, pulse (!) 57, temperature 98.3 F (36.8 C), height _0  (1.6 m), weight 292 lb (132.5 kg), SpO2 97 %. Body mass index is 51.73 kg/m.  General: Cooperative, alert, well developed, in no acute distress. HEENT: Conjunctivae and lids unremarkable. Cardiovascular: Regular rhythm.  Lungs: Normal work of breathing. Neurologic: No focal deficits.   Lab Results  Component Value Date   CREATININE 1.58 (H) 03/03/2020   BUN 35 (H) 03/03/2020   NA 142 03/03/2020   K 4.1 03/03/2020   CL 106 03/03/2020   CO2 26 03/03/2020   Lab Results  Component Value Date   ALT 23 12/15/2019   AST 21 12/15/2019   ALKPHOS 72 12/15/2019   BILITOT 0.3 12/15/2019   Lab Results  Component Value Date   HGBA1C 5.5 03/10/2020   HGBA1C 5.3 12/15/2019   HGBA1C 5.3 07/29/2018   HGBA1C 5.5 11/08/2015   HGBA1C 5.1  03/31/2015   Lab Results  Component Value Date   INSULIN 6.9 12/15/2019   Lab Results  Component Value Date   TSH 1.500 12/15/2019   Lab Results  Component Value Date   CHOL 196 12/15/2019   HDL 68 12/15/2019   LDLCALC 114 (H) 12/15/2019   LDLDIRECT 160.6 06/14/2009   TRIG 78 12/15/2019   CHOLHDL 2.9 12/15/2019   Lab Results  Component Value  Date   WBC 3.7 (L) 03/03/2020   HGB 10.8 (L) 03/03/2020   HCT 35.6 (L) 03/03/2020   MCV 101.1 (H) 03/03/2020   PLT 141 (L) 03/03/2020   Lab Results  Component Value Date   IRON 84 12/15/2019   TIBC 354 12/15/2019   FERRITIN 48 12/15/2019   Obesity Behavioral Intervention:   Approximately 15 minutes were spent on the discussion below.  ASK: We discussed the diagnosis of obesity with [name] today and [name] agreed to give [place] permission to discuss obesity behavioral modification therapy today.  ASSESS: Robin Hardin has the diagnosis of obesity and her BMI today is 51.7. Robin Hardin is in the action stage of change.   ADVISE: [name] was educated on the multiple health risks of obesity as well as the benefit of weight loss to improve her health. She was advised of the need for long term treatment and the importance of lifestyle modifications to improve her current health and to decrease her risk of future health problems.  AGREE: Multiple dietary modification options and treatment options were discussed and Robin Hardin agreed to follow the recommendations documented in the above note.  ARRANGE: Robin Hardin was educated on the importance of frequent visits to treat obesity as outlined per CMS and USPSTF guidelines and agreed to schedule her next follow up appointment today.  Attestation Statements:   Reviewed by clinician on day of visit: allergies, medications, problem list, medical history, surgical history, family history, social history, and previous encounter notes.  I, Water quality scientist, CMA, am acting as transcriptionist for Briscoe Deutscher, DO  I have reviewed the above documentation for accuracy and completeness, and I agree with the above. Briscoe Deutscher, DO

## 2020-07-25 ENCOUNTER — Other Ambulatory Visit: Payer: Self-pay

## 2020-07-25 ENCOUNTER — Telehealth (INDEPENDENT_AMBULATORY_CARE_PROVIDER_SITE_OTHER): Payer: Medicare HMO | Admitting: Psychiatry

## 2020-07-25 ENCOUNTER — Encounter (HOSPITAL_COMMUNITY): Payer: Self-pay | Admitting: Psychiatry

## 2020-07-25 VITALS — Wt 293.0 lb

## 2020-07-25 DIAGNOSIS — F330 Major depressive disorder, recurrent, mild: Secondary | ICD-10-CM | POA: Diagnosis not present

## 2020-07-25 DIAGNOSIS — F419 Anxiety disorder, unspecified: Secondary | ICD-10-CM | POA: Diagnosis not present

## 2020-07-25 MED ORDER — TRAZODONE HCL 100 MG PO TABS: 100.0000 mg | ORAL_TABLET | Freq: Every day | ORAL | 0 refills | Status: DC

## 2020-07-25 MED ORDER — DULOXETINE HCL 60 MG PO CPEP: 60.0000 mg | ORAL_CAPSULE | Freq: Every day | ORAL | 0 refills | Status: DC

## 2020-07-25 MED ORDER — DULOXETINE HCL 30 MG PO CPEP: 30.0000 mg | ORAL_CAPSULE | Freq: Every day | ORAL | 1 refills | Status: DC

## 2020-07-25 NOTE — Progress Notes (Signed)
Virtual Visit via Telephone Note  I connected with Robin Hardin on 07/25/20 at  2:00 PM EST by telephone and verified that I am speaking with the correct person using two identifiers.  Location: Patient: Home Provider: Home Office   I discussed the limitations, risks, security and privacy concerns of performing an evaluation and management service by telephone and the availability of in person appointments. I also discussed with the patient that there may be a patient responsible charge related to this service. The patient expressed understanding and agreed to proceed.   History of Present Illness: Patient is evaluated by phone session.  She is on the phone by herself.  Patient told lately feeling more sad, anxious and depressed.  She is not sure why but believes due to the medicine she is taking for breast cancer.  She finished radiation but her oncologist started her on Arimidex and she may need to take for 5 years.  She noticed since then her mood is down.  She is sad and sometimes difficulty sleeping.  She does not go outside but does go to church and those days she drives.  She had a good supportive family.  She lives with her husband and children.  She denies any paranoia, hallucination or any suicidal thoughts.  She feels sometimes anxious about her life but denies any hopelessness or suicidal thoughts.  She is also sad because she is unable to lose more weight despite trying change in her appetite she feels her weight is not going down.  She she sees weight loss management.  She has no tremors, shakes or any EPS.  She is taking trazodone 100 mg and Cymbalta 60 mg.  Past Psychiatric History: H/O depression and anxiety. No h/oinpatient,psychosis,mania andsuicidal attempt.TookProzacbutquitworking.Wellbutrin, Rexulti, celexa, Ambien and abilify did not work.Remeronand Amitriptylinecaused weight gain. [place] not approved.  Psychiatric Specialty Exam: Physical Exam  Review of  Systems  Weight 293 lb (132.9 kg).There is no height or weight on file to calculate BMI.  General Appearance: NA  Eye Contact:  NA  Speech:  Slow  Volume:  Decreased  Mood:  Anxious and Depressed  Affect:  NA  Thought Process:  Goal Directed  Orientation:  Full (Time, Place, and Person)  Thought Content:  Rumination  Suicidal Thoughts:  No  Homicidal Thoughts:  No  Robin:  Immediate;   Good Recent;   Good Remote;   Good  Judgement:  Intact  Insight:  Present  Psychomotor Activity:  NA  Concentration:  Concentration: Fair and Attention Span: Fair  Recall:  Good  Fund of Knowledge:  Good  Language:  Good  Akathisia:  No  Handed:  Right  AIMS (if indicated):     Assets:  Communication Skills Desire for Improvement Housing Resilience Social Support Transportation  ADL's:  Intact  Cognition:  WNL  Sleep:   fair      Assessment and Plan: Major depressive disorder, recurrent.  Anxiety.  Discussed optimizing medication to help her anxiety and depression.  Patient agreed to give a try Cymbalta 90 mg..  She is not interested in therapy.  She had a good support from church, children and her husband.  So far patient tolerating medication and reported no side effects.  We will try Cymbalta 90 mg daily and keep the trazodone 100 mg at bedtime.  Recommended to call us back if she has any question or any concern.  Follow-up in 6 weeks.  Follow Up Instructions:    I discussed the assessment  and treatment plan with the patient. The patient was provided an opportunity to ask questions and all were answered. The patient agreed with the plan and demonstrated an understanding of the instructions.   The patient was advised to call back or seek an in-person evaluation if the symptoms worsen or if the condition fails to improve as anticipated.  I provided 15 minutes of non-face-to-face time during this encounter.   Kathlee Nations, MD

## 2020-07-27 IMAGING — MG MM PLC BREAST LOC DEV 1ST LESION INC MAMMO GUIDE*L*
8 series · 8 of 8 positions shown · non-contrast
Comparison: Previous exam(s).

CLINICAL DATA: 68-year-old female for radioactive seed localization
of LEFT breast cancer prior to lumpectomy.

EXAM:
MAMMOGRAPHIC GUIDED RADIOACTIVE SEED LOCALIZATION OF THE LEFT BREAST

[L LM (1 of 4)]
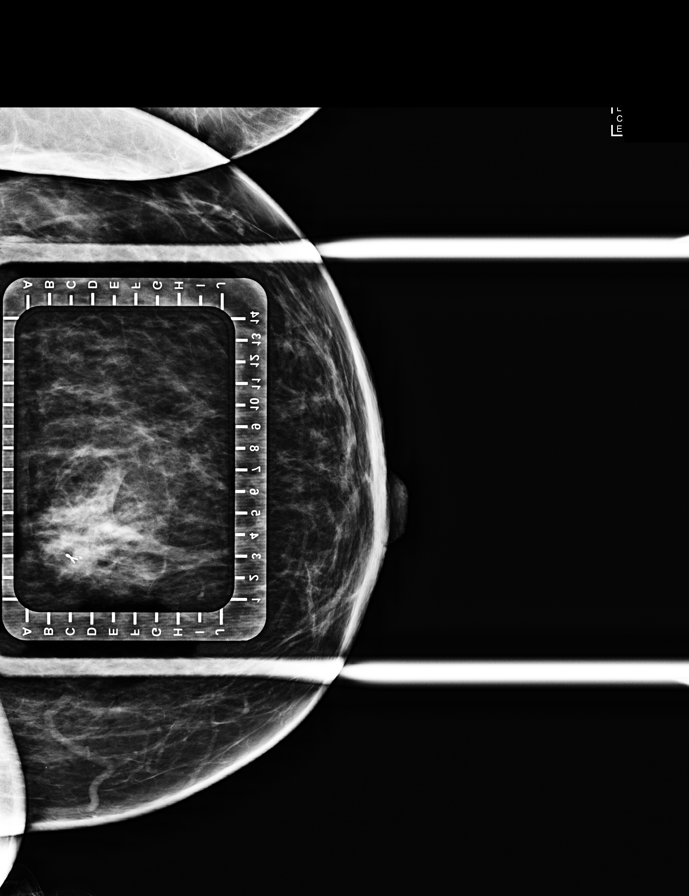

[L CC (1 of 4)]
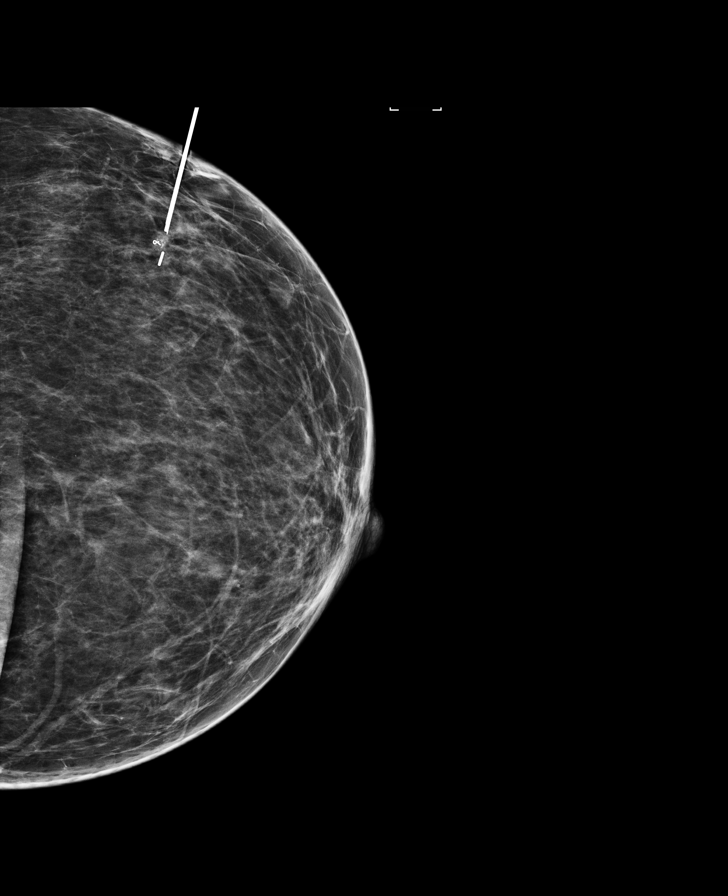

[L LM (2 of 4)]
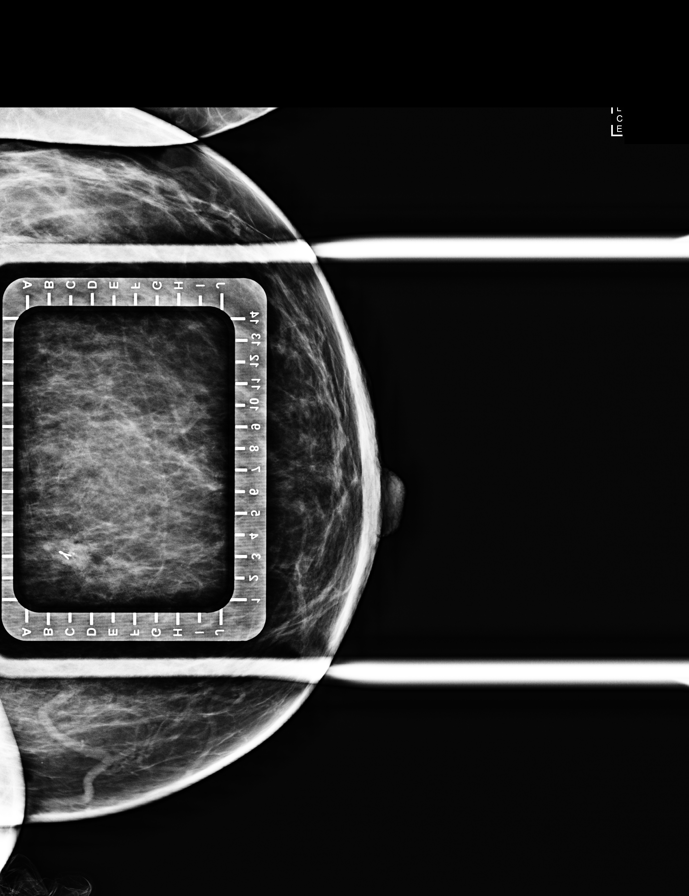

[L CC (2 of 4)]
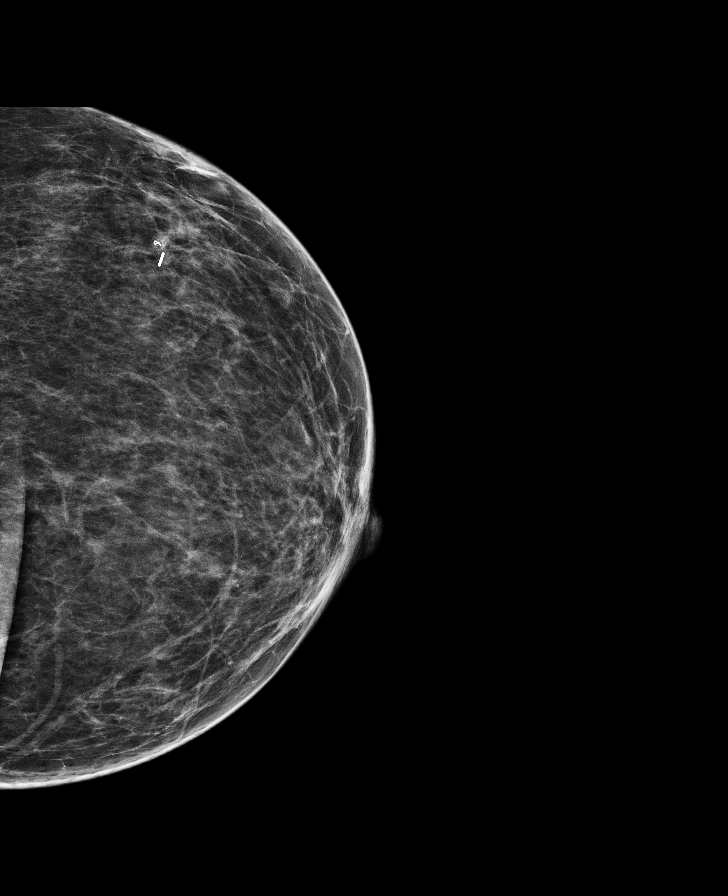

[L LM (3 of 4)]
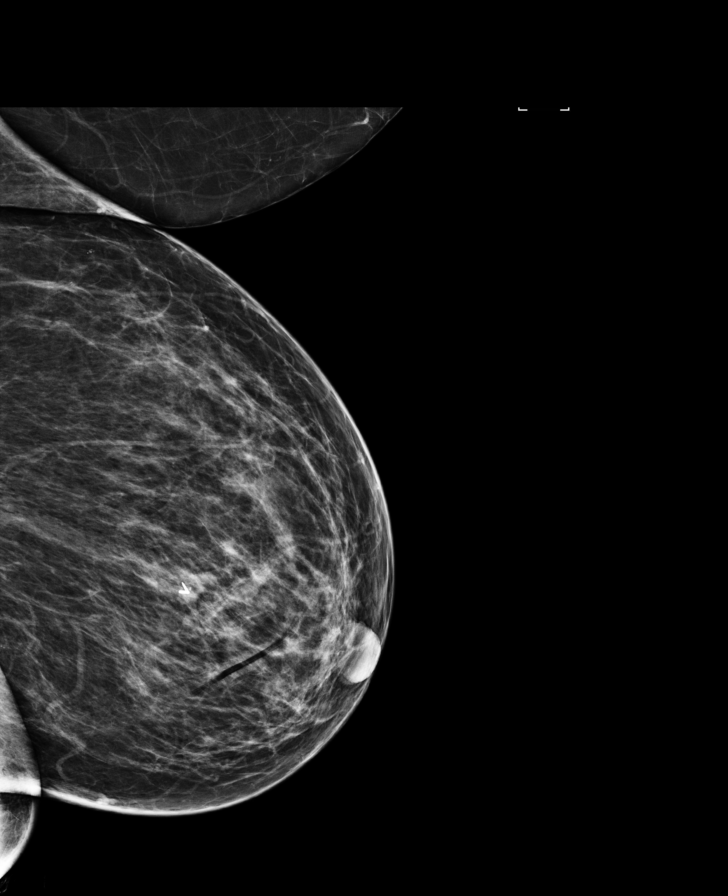

[L LM (4 of 4)]
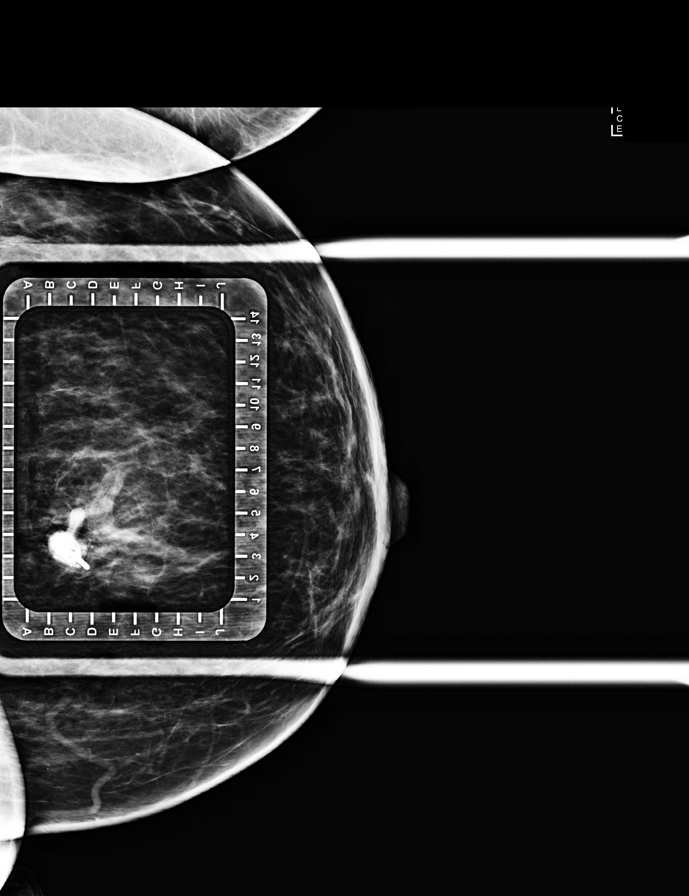

[L CC (3 of 4)]
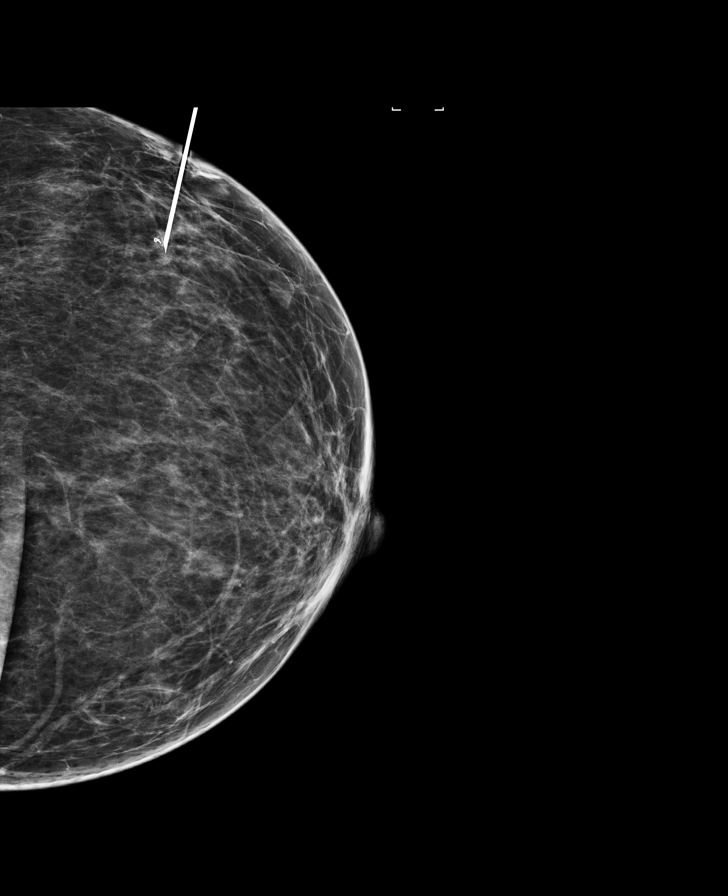

[L CC (4 of 4)]
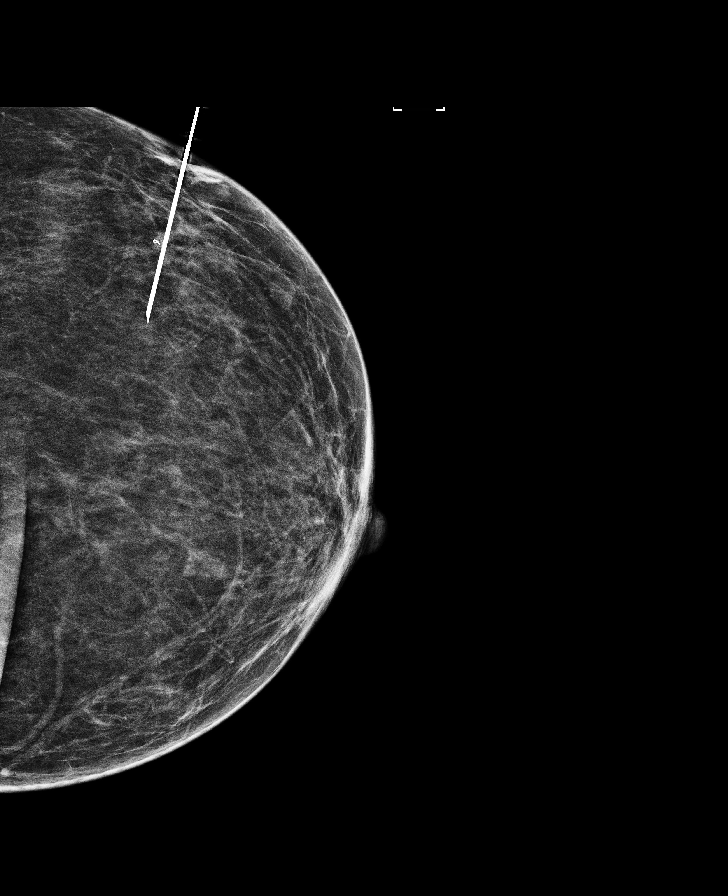

[8 of 8 positions shown; findings below may reference images not displayed]

FINDINGS: Patient presents for radioactive seed localization prior to LEFT
lumpectomy. I met with the patient and we discussed the procedure of
seed localization including benefits and alternatives. We discussed
the high likelihood of a successful procedure. We discussed the
risks of the procedure including infection, bleeding, tissue injury
and further surgery. We discussed the low dose of radioactivity
involved in the procedure. Informed, written consent was given.

The usual time-out protocol was performed immediately prior to the
procedure.

Using mammographic guidance, sterile technique, 1% lidocaine and an
U-YHX radioactive seed, the RIBBON clip was localized using a
LATERAL approach. The follow-up mammogram images confirm the seed in
the expected location and were marked for Dr. Silvester.

Follow-up survey of the patient confirms presence of the radioactive
seed.

Order number of U-YHX seed:  [id number].

Total activity:  0.253 millicuries.  Reference Date: 02/25/2020.

The patient tolerated the procedure well and was released from the
[REDACTED]. She was given instructions regarding seed removal.
IMPRESSION: Radioactive seed localization LEFT breast. No apparent
complications.

## 2020-07-28 ENCOUNTER — Ambulatory Visit (INDEPENDENT_AMBULATORY_CARE_PROVIDER_SITE_OTHER): Payer: Medicare HMO | Admitting: Family Medicine

## 2020-07-28 IMAGING — MG MM BREAST SURGICAL SPECIMEN
1 series · 1 of 1 positions shown · non-contrast
Comparison: Previous exam(s).

CLINICAL DATA: Status post radioactive seed localization of the
LEFT breast for LEFT breast cancer.

EXAM:
SPECIMEN RADIOGRAPH OF THE LEFT BREAST

[L]
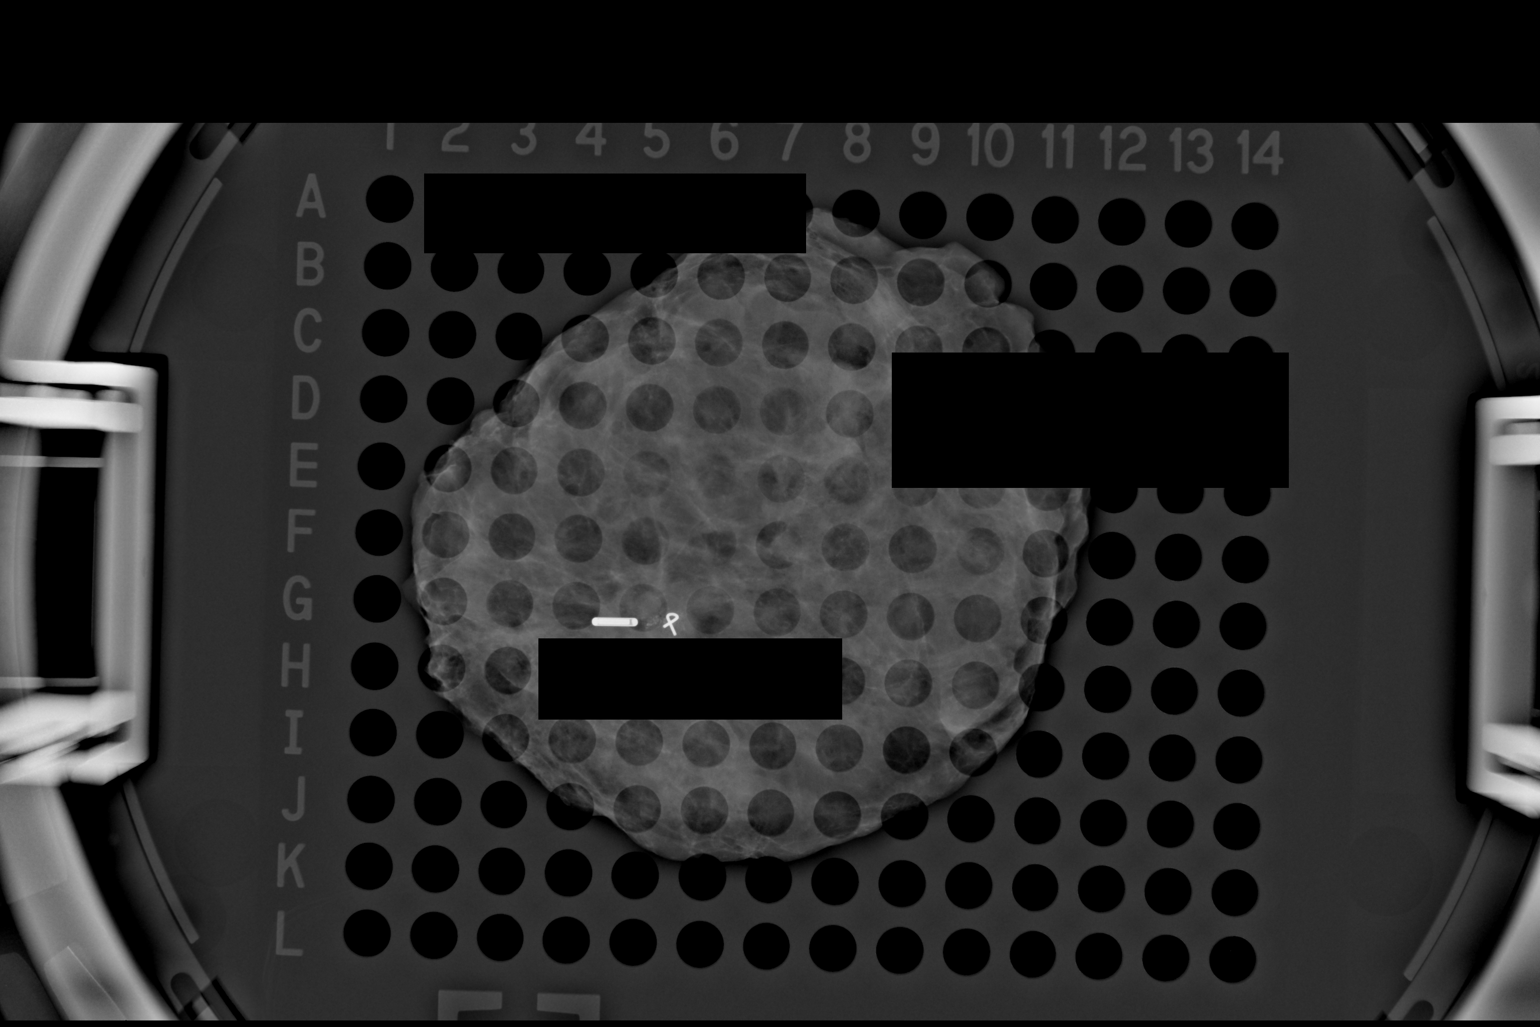

[1 of 1 positions shown; findings below may reference images not displayed]

FINDINGS: Status post excision of the LEFT breast. The radioactive seed and
ribbon shaped biopsy marker clip are present, completely intact, and
were marked for pathology.
IMPRESSION: Specimen radiograph of the LEFT breast.

## 2020-08-03 ENCOUNTER — Ambulatory Visit (INDEPENDENT_AMBULATORY_CARE_PROVIDER_SITE_OTHER): Payer: Medicare HMO | Admitting: Family Medicine

## 2020-08-07 ENCOUNTER — Other Ambulatory Visit: Payer: Self-pay | Admitting: Internal Medicine

## 2020-08-07 DIAGNOSIS — I5042 Chronic combined systolic (congestive) and diastolic (congestive) heart failure: Secondary | ICD-10-CM

## 2020-08-08 ENCOUNTER — Other Ambulatory Visit (HOSPITAL_COMMUNITY): Payer: Self-pay | Admitting: Cardiology

## 2020-08-08 ENCOUNTER — Encounter (INDEPENDENT_AMBULATORY_CARE_PROVIDER_SITE_OTHER): Payer: Self-pay | Admitting: Family Medicine

## 2020-08-08 ENCOUNTER — Ambulatory Visit (INDEPENDENT_AMBULATORY_CARE_PROVIDER_SITE_OTHER): Payer: Medicare HMO | Admitting: Family Medicine

## 2020-08-08 ENCOUNTER — Telehealth: Payer: Self-pay | Admitting: *Deleted

## 2020-08-08 ENCOUNTER — Other Ambulatory Visit: Payer: Self-pay

## 2020-08-08 ENCOUNTER — Telehealth (HOSPITAL_COMMUNITY): Payer: Self-pay | Admitting: Pharmacy Technician

## 2020-08-08 VITALS — BP 125/75 | HR 55 | Temp 98.1°F | Ht 63.0 in | Wt 300.0 lb

## 2020-08-08 DIAGNOSIS — Z6841 Body Mass Index (BMI) 40.0 and over, adult: Secondary | ICD-10-CM

## 2020-08-08 DIAGNOSIS — I5042 Chronic combined systolic (congestive) and diastolic (congestive) heart failure: Secondary | ICD-10-CM

## 2020-08-08 DIAGNOSIS — R635 Abnormal weight gain: Secondary | ICD-10-CM | POA: Diagnosis not present

## 2020-08-08 DIAGNOSIS — F3289 Other specified depressive episodes: Secondary | ICD-10-CM

## 2020-08-08 DIAGNOSIS — E559 Vitamin D deficiency, unspecified: Secondary | ICD-10-CM | POA: Diagnosis not present

## 2020-08-08 NOTE — Telephone Encounter (Signed)
Received call from pt with complaint of change in vision and memory since starting anastrozole.  Pt states when she reads the bible she is having difficulty seeing the words and comprehending what she has read.  Pt states this is causing her to become depressed.  Pt denies suicidal ideations at this time.  Per MD pt to stop anastrozole x1 month and will re assess during upcoming visit with Wilber Bihari, NP.  Pt verbalized understanding and appreciative of the advice.

## 2020-08-08 NOTE — Telephone Encounter (Signed)
Spoke to patient regarding re-enrollment of [employer] assistance with [employer]. Will have the application at the check in desk for the patient to sign.   Will fax in once all signatures are obtained.

## 2020-08-09 LAB — VITAMIN D 25 HYDROXY (VIT D DEFICIENCY, FRACTURES): Vit D, 25-Hydroxy: 30.6 ng/mL (ref 30.0–100.0)

## 2020-08-09 LAB — COMPREHENSIVE METABOLIC PANEL
ALT: 42 IU/L — ABNORMAL HIGH (ref 0–32)
AST: 28 IU/L (ref 0–40)
Albumin/Globulin Ratio: 1.8 (ref 1.2–2.2)
Albumin: 4.1 g/dL (ref 3.8–4.8)
Alkaline Phosphatase: 68 IU/L (ref 44–121)
BUN/Creatinine Ratio: 23 (ref 12–28)
BUN: 34 mg/dL — ABNORMAL HIGH (ref 8–27)
Bilirubin Total: 0.2 mg/dL (ref 0.0–1.2)
CO2: 28 mmol/L (ref 20–29)
Calcium: 9.5 mg/dL (ref 8.7–10.3)
Chloride: 106 mmol/L (ref 96–106)
Creatinine, Ser: 1.5 mg/dL — ABNORMAL HIGH (ref 0.57–1.00)
GFR calc Af Amer: 41 mL/min/{1.73_m2} — ABNORMAL LOW (ref >59)
GFR calc non Af Amer: 35 mL/min/{1.73_m2} — ABNORMAL LOW (ref >59)
Globulin, Total: 2.3 g/dL (ref 1.5–4.5)
Glucose: 90 mg/dL (ref 65–99)
Potassium: 5.1 mmol/L (ref 3.5–5.2)
Sodium: 147 mmol/L — ABNORMAL HIGH (ref 134–144)
Total Protein: 6.4 g/dL (ref 6.0–8.5)

## 2020-08-09 LAB — BRAIN NATRIURETIC PEPTIDE: BNP: 150.9 pg/mL — ABNORMAL HIGH (ref 0.0–100.0)

## 2020-08-09 LAB — MAGNESIUM: Magnesium: 2 mg/dL (ref 1.6–2.3)

## 2020-08-09 NOTE — Progress Notes (Signed)
Chief Complaint:   OBESITY Dwayne is here to discuss her progress with her obesity treatment plan along with follow-up of her obesity related diagnoses.   Today's visit was #: 8 Starting weight: 301 lbs Starting date: 12/15/2019 Today's weight: 300 lbs Today's date: 08/08/2020 Total lbs lost to date: 1 lb Body mass index is 53.14 kg/m.  Total weight loss percentage to date: -0.33%  Interim History: Cire says she has had increased confusion and forgetfulness.  She also has some increased water weight and says she feels tight.  She had [place] [employer] last night. Usually uses less salt or a salt substitute like Mrs. Dash.   Nutrition Plan: the [place] for 90% of the time.  Activity: None at this time.  Assessment/Plan:   1. Vitamin D deficiency Not at goal. Current vitamin D is 26.6, tested on 12/15/2019. Optimal goal > 50 ng/dL.   Plan:  _0   Continue Vitamin D _1 ,000 IU every week. _2   Continue home supplement daily. _3   Follow-up for routine testing of Vitamin D at least 2-3 times per year to avoid over-replacement.  - VITAMIN D 25 Hydroxy (Vit-D Deficiency, Fractures)  2. Weight gain, abnormal Willodene has had some increased water weight due to increased salt intake.  Will check BNP today.  - B Nat Peptide  3. Chronic combined systolic and diastolic congestive heart failure ([place]) Followed by Cardiology. Will continue to monitor symptoms as they relate toherweight loss journey.  - Magnesium - Comprehensive metabolic panel  4. Other depression Cymbalta was increased by 30 mg recently.  She is followed by Psychiatry.  Pearlieis struggling with emotional eating and using food for comfort to the extent that it is negatively impacting herhealth. Nunzio Cory been working on behavior modification techniques to help reduce heremotional eating and has been minimally successful.Sheshows no sign of suicidal or homicidal ideations.  5. Class 3 severe  obesity with serious comorbidity and body mass index (BMI) of 50.0 to 59.9 in adult, unspecified obesity type ([place])  Course: Dorathy is currently in the action stage of change. As such, her goal is to continue with weight loss efforts.   Nutrition goals: She has agreed to the [place].   Exercise goals: Older adults should follow the adult guidelines. When older adults cannot meet the adult guidelines, they should be as physically active as their abilities and conditions will allow.  Older adults should do exercises that maintain or improve balance if they are at risk of falling.   Behavioral modification strategies: decreasing sodium intake, increasing high fiber foods and emotional eating strategies.  Evolett has agreed to follow-up with our clinic in 3-4 weeks with Mina Marble,  or Dr. Juleen China. She was informed of the importance of frequent follow-up visits to maximize her success with intensive lifestyle modifications for her multiple health conditions.   Objective:   Blood pressure 125/75, pulse (!) 55, temperature 98.1 F (36.7 C), temperature source Oral, height _4  (1.6 m), weight 300 lb (136.1 kg), SpO2 94 %. Body mass index is 53.14 kg/m.  General: Cooperative, alert, well developed, in no acute distress. HEENT: Conjunctivae and lids unremarkable. Cardiovascular: Regular rhythm.  Lungs: Normal work of breathing. Neurologic: No focal deficits.   Lab Results  Component Value Date   CREATININE 1.50 (H) 08/08/2020   BUN 34 (H) 08/08/2020   NA 147 (H) 08/08/2020   K 5.1 08/08/2020   CL 106 08/08/2020   CO2 28 08/08/2020   Lab Results  Component Value Date   ALT 42 (H) 08/08/2020   AST 28 08/08/2020   ALKPHOS 68 08/08/2020   BILITOT 0.2 08/08/2020   Lab Results  Component Value Date   HGBA1C 5.5 03/10/2020   HGBA1C 5.3 12/15/2019   HGBA1C 5.3 07/29/2018   HGBA1C 5.5 11/08/2015   HGBA1C 5.1 03/31/2015   Lab Results  Component Value Date   INSULIN 6.9  12/15/2019   Lab Results  Component Value Date   TSH 1.500 12/15/2019   Lab Results  Component Value Date   CHOL 196 12/15/2019   HDL 68 12/15/2019   LDLCALC 114 (H) 12/15/2019   LDLDIRECT 160.6 06/14/2009   TRIG 78 12/15/2019   CHOLHDL 2.9 12/15/2019   Lab Results  Component Value Date   WBC 3.7 (L) 03/03/2020   HGB 10.8 (L) 03/03/2020   HCT 35.6 (L) 03/03/2020   MCV 101.1 (H) 03/03/2020   PLT 141 (L) 03/03/2020   Lab Results  Component Value Date   IRON 84 12/15/2019   TIBC 354 12/15/2019   FERRITIN 48 12/15/2019   Obesity Behavioral Intervention:   Approximately 15 minutes were spent on the discussion below.  ASK: We discussed the diagnosis of obesity with Sabryn today and Claude agreed to give [place] permission to discuss obesity behavioral modification therapy today.  ASSESS: Digna has the diagnosis of obesity and her BMI today is 53.2. Shandy is in the action stage of change.   ADVISE: Sebastiana was educated on the multiple health risks of obesity as well as the benefit of weight loss to improve her health. She was advised of the need for long term treatment and the importance of lifestyle modifications to improve her current health and to decrease her risk of future health problems.  AGREE: Multiple dietary modification options and treatment options were discussed and Luvern agreed to follow the recommendations documented in the above note.  ARRANGE: Malu was educated on the importance of frequent visits to treat obesity as outlined per CMS and USPSTF guidelines and agreed to schedule her next follow up appointment today.  Attestation Statements:   Reviewed by clinician on day of visit: allergies, medications, problem list, medical history, surgical history, family history, social history, and previous encounter notes.  I, Water quality scientist, CMA, am acting as transcriptionist for Briscoe Deutscher, DO  I have reviewed the above documentation for accuracy and  completeness, and I agree with the above. Briscoe Deutscher, DO

## 2020-08-29 ENCOUNTER — Ambulatory Visit (INDEPENDENT_AMBULATORY_CARE_PROVIDER_SITE_OTHER): Payer: Medicare HMO | Admitting: Family Medicine

## 2020-08-30 ENCOUNTER — Other Ambulatory Visit: Payer: Self-pay

## 2020-08-30 ENCOUNTER — Encounter (INDEPENDENT_AMBULATORY_CARE_PROVIDER_SITE_OTHER): Payer: Self-pay | Admitting: Adult Health

## 2020-08-30 ENCOUNTER — Ambulatory Visit (INDEPENDENT_AMBULATORY_CARE_PROVIDER_SITE_OTHER): Payer: Medicare HMO | Admitting: Adult Health

## 2020-08-30 VITALS — BP 126/75 | HR 71 | Temp 98.4°F | Ht 63.0 in | Wt 300.0 lb

## 2020-08-30 DIAGNOSIS — Z6841 Body Mass Index (BMI) 40.0 and over, adult: Secondary | ICD-10-CM

## 2020-08-30 DIAGNOSIS — I5042 Chronic combined systolic (congestive) and diastolic (congestive) heart failure: Secondary | ICD-10-CM | POA: Diagnosis not present

## 2020-08-30 DIAGNOSIS — E559 Vitamin D deficiency, unspecified: Secondary | ICD-10-CM

## 2020-08-31 NOTE — Progress Notes (Signed)
Chief Complaint:   OBESITY Robin Hardin is here to discuss her progress with her obesity treatment plan along with follow-up of her obesity related diagnoses. Joanie is on the [place] and practicing portion control and making smarter food choices, such as increasing vegetables and decreasing simple carbohydrates and states she is following her eating plan approximately 10% of the time. Lyndsi states she is exercising 0 minutes 0 times per week.  Today's visit was #: 9 Starting weight: 301 lbs Starting date: 12/15/2019 Today's weight: 300 lbs Today's date: 08/30/2020 Total lbs lost to date: 1 lb Total lbs lost since last in-office visit: 0  Interim History: Alley is not following a structured plan. She states, "I'm just trying to eat less." She has previously been on Category 2 + 100 calories and vegetarian meal plans. She and her husband frequently order food from [employer].  Subjective:   1. Chronic combined systolic and diastolic congestive heart failure (HCC) Worsening. Discussed labs with patient today. 08/08/2020 BNP elevated at 150.9. Last BNP on 09/17/2019 was 105.8. She denies chest pain and dyspnea at present. She denies tobacco/vape products. Last OV with Dr. McLean/heart Failure- was July 2021 and patient was instructed to follow up in 6 months.  2. Vitamin D deficiency Sapna's Vitamin D level was 30.6 on 08/08/2020. She is currently taking prescription vitamin D 50,000 IU each week, which was last refilled May 2021. She denies nausea, vomiting or muscle weakness.  Ref. Range 08/08/2020 09:25  Vitamin D, 25-Hydroxy Latest Ref Range: 30.0 - 100.0 ng/mL 30.6   Assessment/Plan:   1. Chronic combined systolic and diastolic congestive heart failure ([place]) Follow up with Dr. Aundra Dubin ASAP.  2. Vitamin D deficiency Low Vitamin D level contributes to fatigue and are associated with obesity, breast, and colon cancer. She agrees to continue  to take prescription Vitamin D _0 ,000 IU every week and will follow-up for routine testing of Vitamin D, at least 2-3 times per year to avoid over-replacement. Refill Vit D 50,000 Units.  3. Class 3 severe obesity with serious comorbidity and body mass index (BMI) of 50.0 to 59.9 in adult, unspecified obesity type ([place]) Tynika is currently in the action stage of change. As such, her goal is to continue with weight loss efforts. She has agreed to the [place].   Try to add in protein at breakfast- eggs. Try to make dinner at home at least twice a week.  Handout: Vegetarian Meal Plan  Exercise goals: No exercise has been prescribed at this time.  Behavioral modification strategies: increasing lean protein intake, decreasing simple carbohydrates, decreasing sodium intake, decreasing eating out, no skipping meals, meal planning and cooking strategies and planning for success.  Raeanna has agreed to follow-up with our clinic in 2 weeks (already made with Dr. Juleen China). She was informed of the importance of frequent follow-up visits to maximize her success with intensive lifestyle modifications for her multiple health conditions.   Objective:   Blood pressure 126/75, pulse 71, temperature 98.4 F (36.9 C), temperature source Oral, height 5' 3" (1.6 m), weight 300 lb (136.1 kg), SpO2 99 %. Body mass index is 53.14 kg/m.  General: Cooperative, alert, well developed, in no acute distress. HEENT: Conjunctivae and lids unremarkable. Cardiovascular: Regular rhythm.  Lungs: Normal work of breathing. Neurologic: No focal deficits.   Lab Results  Component Value Date   CREATININE 1.50 (H) 08/08/2020   BUN 34 (H) 08/08/2020   NA 147 (H) 08/08/2020  K 5.1 08/08/2020   CL 106 08/08/2020   CO2 28 08/08/2020   Lab Results  Component Value Date   ALT 42 (H) 08/08/2020   AST 28 08/08/2020   ALKPHOS 68 08/08/2020   BILITOT 0.2 08/08/2020   Lab Results  Component Value Date   HGBA1C 5.5  03/10/2020   HGBA1C 5.3 12/15/2019   HGBA1C 5.3 07/29/2018   HGBA1C 5.5 11/08/2015   HGBA1C 5.1 03/31/2015   Lab Results  Component Value Date   INSULIN 6.9 12/15/2019   Lab Results  Component Value Date   TSH 1.500 12/15/2019   Lab Results  Component Value Date   CHOL 196 12/15/2019   HDL 68 12/15/2019   LDLCALC 114 (H) 12/15/2019   LDLDIRECT 160.6 06/14/2009   TRIG 78 12/15/2019   CHOLHDL 2.9 12/15/2019   Lab Results  Component Value Date   WBC 3.7 (L) 03/03/2020   HGB 10.8 (L) 03/03/2020   HCT 35.6 (L) 03/03/2020   MCV 101.1 (H) 03/03/2020   PLT 141 (L) 03/03/2020   Lab Results  Component Value Date   IRON 84 12/15/2019   TIBC 354 12/15/2019   FERRITIN 48 12/15/2019    Obesity Behavioral Intervention:   Approximately 15 minutes were spent on the discussion below.  ASK: We discussed the diagnosis of obesity with Nakiesha today and Shenise agreed to give [place] permission to discuss obesity behavioral modification therapy today.  ASSESS: Arlana has the diagnosis of obesity and her BMI today is 53.2. Philippa is in the action stage of change.   ADVISE: Willadeen was educated on the multiple health risks of obesity as well as the benefit of weight loss to improve her health. She was advised of the need for long term treatment and the importance of lifestyle modifications to improve her current health and to decrease her risk of future health problems.  AGREE: Multiple dietary modification options and treatment options were discussed and Jream agreed to follow the recommendations documented in the above note.  ARRANGE: Ellianah was educated on the importance of frequent visits to treat obesity as outlined per CMS and USPSTF guidelines and agreed to schedule her next follow up appointment today.  Attestation Statements:   Reviewed by clinician on day of visit: allergies, medications, problem list, medical history, surgical history, family history, social history, and  previous encounter notes.  Coral Ceo, am acting as Location manager for Mina Marble, NP.  I have reviewed the above documentation for accuracy and completeness, and I agree with the above. -  Karen Kinnard d. Marks Scalera, NP-C

## 2020-09-01 ENCOUNTER — Inpatient Hospital Stay: Payer: Medicare HMO | Attending: Hematology and Oncology | Admitting: Adult Health

## 2020-09-01 DIAGNOSIS — C50412 Malignant neoplasm of upper-outer quadrant of left female breast: Secondary | ICD-10-CM | POA: Diagnosis not present

## 2020-09-01 DIAGNOSIS — Z170 Estrogen receptor positive status [ER+]: Secondary | ICD-10-CM

## 2020-09-01 MED ORDER — LETROZOLE 2.5 MG PO TABS: 2.5000 mg | ORAL_TABLET | Freq: Every day | ORAL | 3 refills | Status: AC

## 2020-09-01 NOTE — Progress Notes (Addendum)
SURVIVORSHIP VIRTUAL VISIT:  I connected with Robin Hardin on 09/01/20 at 10:30 AM EST by telephone and verified that I am speaking with the correct person using two identifiers.  I discussed the limitations, risks, security and privacy concerns of performing an evaluation and management service virtually and the availability of in person appointments. I also discussed with the patient that there may be a patient responsible charge related to this service. The patient expressed understanding and agreed to proceed.   Patient location: home Provider location: office Others participating in call: none  BRIEF ONCOLOGIC HISTORY:  Oncology History  Malignant neoplasm of upper-outer quadrant of left breast in female, estrogen receptor positive (Robin Hardin)  02/15/2020 Initial Diagnosis   Screening mammogram showed a left breast mass and calcifications. Diagnostic mammogram and US showed a 1.3cm mass at the 3 o'clock position in the left breast, no axillary adenopathy. Biopsy showed IDC with DCIS, grade 2, HER-2 - (1+), ER+ 95%, PR+ 95%, Ki67 10%.   03/10/2020 Surgery   Left lumpectomy Robin Hardin) [phone]): IDC, grade 1, 1.2cm, with intermediate grade DCIS, clear margins, 5 sentinel lymph nodes negative for carcinoma.   03/10/2020 Cancer Staging   Staging form: Breast, AJCC 8th Edition - Pathologic stage from 03/10/2020: Stage IA (pT1b, pN0, cM0, G1, ER+, PR+, HER2-)   03/23/2020 Oncotype testing   The Oncotype DX score was 1 predicting a risk of outside the breast recurrence over the next 9 years of 3% if the patient's only systemic therapy is tamoxifen for 5 years.    04/18/2020 - 05/17/2020 Radiation Therapy   The patient initially received a dose of 40.05 Gy in 15 fractions to the breast using whole-breast tangent fields. This was delivered using a 3-D conformal technique. The pt received a boost delivering an additional 12 Gy in 6 fractions using a electron boost with 62mV electrons. The total  dose was 52.05 Gy.   05/2020 - 05/2025 Anti-estrogen oral therapy   Anastrozole, currently on hold due to changes in vision and memory     INTERVAL HISTORY:  Robin Hardin review her survivorship care plan detailing her treatment course for breast cancer, as well as monitoring long-term side effects of that treatment, education regarding health maintenance, screening, and overall wellness and health promotion.     Overall, Robin Hardin feeling quite well.  She was taking anastrozole, however she had some difficulty with memory and vision, so she has been off and is feeling better.  She also notes that she has been on different medications for anxiety and depression and memory changes could be related to those too.  She notes a decrease in her activity level for the past 3-4 weeks.  She isn't sure why this has been as decreased as it has been.    REVIEW OF SYSTEMS:  Review of Systems  Constitutional: Positive for fatigue. Negative for appetite change, chills, fever and unexpected weight change.  HENT:   Negative for hearing loss, lump/mass and trouble swallowing.   Eyes: Negative for eye problems and icterus.  Respiratory: Negative for chest tightness, cough and shortness of breath.   Cardiovascular: Negative for chest pain, leg swelling and palpitations.  Gastrointestinal: Negative for abdominal distention, abdominal pain, constipation, diarrhea, nausea and vomiting.  Endocrine: Negative for hot flashes.  Genitourinary: Negative for difficulty urinating.   Musculoskeletal: Negative for arthralgias.  Skin: Negative for itching and rash.  Neurological: Negative for dizziness, extremity weakness, headaches and numbness.  Hematological: Negative for adenopathy. Does not bruise/bleed easily.  Psychiatric/Behavioral: Negative for depression. The patient is not nervous/anxious.   Breast: Denies any new nodularity, masses, tenderness, nipple changes, or nipple discharge.    ONCOLOGY  TREATMENT TEAM:  1. Surgeon:  Dr. Ninfa Hardin at [facility] 2. Medical Oncologist: Dr. Lindi Hardin  3. Radiation Oncologist: Dr. Sondra Hardin    PAST MEDICAL/SURGICAL HISTORY:  Past Medical History:  Diagnosis Date  . Acute respiratory failure with hypoxia ([place]) 09/26/2012  . Anxiety   . Arthritis   . Back pain   . Bilateral knee pain   . Cellulitis of lower leg    left  . Chronic combined systolic and diastolic congestive heart failure ([place]) 10/10/2012   2 D echo 09/2012:  Left ventricle: Extremely poor acoustic windows limit study. Overall LVEF appears to be severely depressed. Recomm ordering limited study with contrast to further evaluate LV systolic function. The cavity size was normal. Wall thickness was normal. Doppler parameters are consistent with abnormal left ventricular relaxation (grade 1 diastolic dysfunction).   2 D echo 03/2009 :  Left ventricle: The cavity size was normal. There was mild concentric hypertrophy. Systolic function was mildly reduced. The estimated ejection fraction was in the range of 45% to 50%. Diffuse hypokinesis. Doppler parameters are consistent with abnormal left ventricular relaxation (grade 1 diastolic dysfunction).     . Chronic kidney disease 10/10/2012   Stage 2 with GFR of 64   . Constipation   . Depression   . Edema, lower extremity   . Gout   . H/O: hysterectomy   . History of heart attack   . Hyperlipidemia   . Hypertension   . Joint pain   . Malignant hypertension 09/26/2012  . Morbid obesity ([place])   . Myocardial infarction ([place])   . Nonischemic cardiomyopathy ([place])   . Shortness of breath   . Sleep apnea    reported intolerance to CPAP (03/04/20)  . Unspecified essential hypertension 03/22/2009   Qualifier: Diagnosis of  By: Karrie Meres RN, BSN, [name]    . Vitamin D deficiency    Past Surgical History:  Procedure Laterality Date  . BREAST LUMPECTOMY WITH RADIOACTIVE SEED AND SENTINEL LYMPH NODE BIOPSY Left 03/10/2020   Procedure: LEFT  BREAST LUMPECTOMY WITH RADIOACTIVE SEED AND SENTINEL LYMPH NODE BIOPSY;  Surgeon: Coralie Keens, MD;  Location: [facility];  Service: General;  Laterality: Left;  . fibroid tumor removal  2004     ALLERGIES:  Allergies  Allergen Reactions  . Abilify [Aripiprazole] Nausea Only     CURRENT MEDICATIONS:  Outpatient Encounter Medications as of 09/01/2020  Medication Sig  . allopurinol (ZYLOPRIM) 300 MG tablet TAKE 1 TABLET EVERY DAY  . ALOE PO Take 1 capsule by mouth 2 (two) times daily.  Marland Kitchen anastrozole (ARIMIDEX) 1 MG tablet Take 1 tablet (1 mg total) by mouth daily.  Marland Kitchen aspirin 81 MG EC tablet Take 1 tablet (81 mg total) by mouth daily.  . Biotin w/ Vitamins C & E (HAIR SKIN & NAILS GUMMIES PO) Take 1 Dose by mouth daily.  . carvedilol (COREG) 25 MG tablet TAKE 1 TABLET TWICE DAILY WITH MEALS  . DULoxetine (CYMBALTA) 30 MG capsule Take 1 capsule (30 mg total) by mouth daily.  . DULoxetine (CYMBALTA) 60 MG capsule Take 1 capsule (60 mg total) by mouth daily.  . furosemide (LASIX) 40 MG tablet TAKE 1/2 TABLET EVERY DAY  . Ginkgo Biloba 40 MG TABS Take 1 tablet by mouth daily.  . metFORMIN (GLUCOPHAGE) 500 MG tablet TAKE 1 TABLET TWICE  DAILY WITH MEALS  . methocarbamol (ROBAXIN) 500 MG tablet TAKE 1 TABLET (500 MG TOTAL) BY MOUTH EVERY 8 (EIGHT) HOURS AS NEEDED FOR MUSCLE SPASMS.  . Misc Natural Products (APPLE CIDER VINEGAR DIET PO) Take 1 tablet by mouth daily.  . potassium chloride SA (KLOR-CON) 20 MEQ tablet Take 1 tablet (20 mEq total) by mouth daily.  . rosuvastatin (CRESTOR) 20 MG tablet TAKE 1 TABLET EVERY DAY  . sacubitril-valsartan (ENTRESTO) 49-51 MG Take 1 tablet by mouth 2 (two) times daily.  Marland Kitchen spironolactone (ALDACTONE) 25 MG tablet TAKE 1 TABLET EVERY DAY  . traZODone (DESYREL) 100 MG tablet Take 1 tablet (100 mg total) by mouth at bedtime.  . Vitamin D, Ergocalciferol, (DRISDOL) 1.25 MG (50000 UNIT) CAPS capsule Take 1 capsule (50,000 Units total) by mouth every 7 (seven)  days.   No facility-administered encounter medications on file as of 09/01/2020.     ONCOLOGIC FAMILY HISTORY:  Family History  Problem Relation Age of Onset  . Asthma Mother   . Heart disease Mother   . Stroke Mother        clotting disorders  . Hypertension Mother   . Anxiety disorder Mother   . Depression Mother   . Sleep apnea Mother   . Obesity Mother   . Asthma Brother   . Asthma Brother   . Obesity Father      GENETIC COUNSELING/TESTING: Not at this time  SOCIAL HISTORY:  Social History   Socioeconomic History  . Marital status: Married    Spouse name: Not on file  . Number of children: 2  . Years of education: Not on file  . Highest education level: Not on file  Occupational History  . Occupation: retired Forensic psychologist: UNEMPLOYED  Tobacco Use  . Smoking status: Never Smoker  . Smokeless tobacco: Never Used  Vaping Use  . Vaping Use: Never used  Substance and Sexual Activity  . Alcohol use: No    Alcohol/week: 0.0 standard drinks  . Drug use: No  . Sexual activity: Not Currently    Partners: Male  Other Topics Concern  . 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  Mood and behavior are normal.  Speech is normal.  Breathing is non labored.      LABORATORY DATA:  None for this visit.  DIAGNOSTIC IMAGING:  None for this visit.      ASSESSMENT AND PLAN:  Ms.. Silveira is a pleasant 70 y.o. female  with Stage IA left breast invasive ductal carcinoma, ER+/PR+/HER2-, diagnosed in 01/2020, treated with lumpectomy, adjuvant radiation therapy, and anti-estrogen therapy with Anastrozole beginning in 05/2020.  She presents to the Survivorship Clinic for our initial meeting and routine follow-up post-completion of treatment for breast cancer.    1. Stage IA left breast cancer:  Ms. Michelle is continuing to recover from  definitive treatment for breast cancer. She will follow-up with her medical oncologist, Dr. Lindi Hardin in 3 months with history and physical exam per surveillance protocol.  She had stopped anastrozole due to difficulty with side effects.  These have improved over the past four weeks, however questionably related to other anxiety/depression medications.  I sent in Letrozole for her to take instead, she knows to call us if she has difficulty with taking it.  She will f/u with Dr. Lindi Hardin in about 3 months to discuss how she is tolerating this.  Her mammogram is due 01/2021; orders placed today.   Today, a comprehensive survivorship care plan and treatment summary was reviewed with the patient today detailing her breast cancer diagnosis, treatment course, potential late/long-term effects of treatment, appropriate follow-up care with recommendations for the future, and patient education resources.  A copy of this summary, along with a letter will be sent to the patient's primary care provider via mail/fax/In Basket message after today's visit.    2. Bone health:  Given Ms. Staniszewski's age/history of breast cancer and her current treatment regimen including anti-estrogen therapy with Anastrozole/Letrozole, she is at risk for bone demineralization.  Her last DEXA scan was 2018 and was normal.  I ordered a repeat bone density test in 01/2021 when she has her mammogram.  In the meantime, she was encouraged to increase her consumption of foods rich in calcium, as well as increase her weight-bearing activities.   She was given education on specific activities to promote bone health.  3. Cancer screening:  Due to Ms. Finch's history and her age, she should receive screening for skin cancers, colon cancer, and gynecologic cancers.  The information and recommendations are listed on the patient's comprehensive care plan/treatment summary and were reviewed in detail with the patient.    4. Health maintenance and wellness promotion: Ms. Heaton was encouraged to consume 5-7 servings of fruits and vegetables per day. We reviewed the "Nutrition Rainbow" handout, as well as the handout "Take Control of Your Health and Reduce Your Cancer Risk" from the [facility].  She was also encouraged to engage in moderate to vigorous exercise for 30 minutes per day most days of the week. We discussed the [employer] fitness program, which is designed for cancer survivors to help them become more physically fit after cancer treatments.  She was instructed to limit her alcohol consumption and continue to abstain from tobacco use.     5. Support services/counseling: It is not uncommon for this period of the patient's cancer care trajectory to be one of many emotions and stressors.  We discussed how this can be increasingly difficult during the times of quarantine and social distancing due to the COVID-19 pandemic.   She was given information regarding our available services and encouraged to contact me with any questions or for help enrolling in any of our support group/programs.    Follow up instructions:    -Return to cancer center in 3 months for f/u with Dr. Lindi Hardin  -Mammogram due in 01/2021 -Bone density in 01/2021 -She is welcome to return back to the Survivorship Clinic at any time; no additional follow-up needed at this time.  -Consider referral back to survivorship as a long-term survivor for continued surveillance  The patient was provided an opportunity to ask questions and all were answered. The  patient agreed with the plan and demonstrated an understanding of the instructions. She knows to call for any questions that may arise between now and her  next appointment.  We are happy to see her sooner if needed.   Total encounter time: 22 minutes telephone visit time  Wilber Bihari, NP 09/01/20 10:33 AM Medical Oncology and Hematology [facility] [place], [place] [address] Tel. [phone]    Fax. [phone]  *Total Encounter Time as defined by the Centers for Medicare and Medicaid Services includes, in addition to the face-to-face time of a patient visit (documented in the note above) non-face-to-face time: obtaining and reviewing outside history, ordering and reviewing medications, tests or procedures, care coordination (communications with other health care professionals or caregivers) and documentation in the medical record.

## 2020-09-03 ENCOUNTER — Other Ambulatory Visit: Payer: Self-pay | Admitting: Hematology and Oncology

## 2020-09-03 DIAGNOSIS — E2839 Other primary ovarian failure: Secondary | ICD-10-CM

## 2020-09-06 ENCOUNTER — Encounter (HOSPITAL_COMMUNITY): Payer: Self-pay | Admitting: Psychiatry

## 2020-09-06 ENCOUNTER — Other Ambulatory Visit: Payer: Self-pay

## 2020-09-06 ENCOUNTER — Telehealth (INDEPENDENT_AMBULATORY_CARE_PROVIDER_SITE_OTHER): Payer: Medicare HMO | Admitting: Psychiatry

## 2020-09-06 DIAGNOSIS — F330 Major depressive disorder, recurrent, mild: Secondary | ICD-10-CM

## 2020-09-06 DIAGNOSIS — F419 Anxiety disorder, unspecified: Secondary | ICD-10-CM | POA: Diagnosis not present

## 2020-09-06 MED ORDER — TRAZODONE HCL 100 MG PO TABS: 100.0000 mg | ORAL_TABLET | Freq: Every day | ORAL | 0 refills | Status: AC

## 2020-09-06 MED ORDER — DULOXETINE HCL 30 MG PO CPEP: 30.0000 mg | ORAL_CAPSULE | Freq: Every day | ORAL | 0 refills | Status: AC

## 2020-09-06 MED ORDER — DULOXETINE HCL 60 MG PO CPEP: 60.0000 mg | ORAL_CAPSULE | Freq: Every day | ORAL | 0 refills | Status: AC

## 2020-09-06 NOTE — Progress Notes (Signed)
Virtual Visit via Telephone Note  I connected with Robin Hardin on 09/06/20 at  2:00 PM EST by telephone and verified that I am speaking with the correct person using two identifiers.  Location: Patient: home Provider: home office   I discussed the limitations, risks, security and privacy concerns of performing an evaluation and management service by telephone and the availability of in person appointments. I also discussed with the patient that there may be a patient responsible charge related to this service. The patient expressed understanding and agreed to proceed.   History of Present Illness: Patient is evaluated by phone session.  She is on the phone by herself.  On the last visit we increased Cymbalta 90 mg.  She is taking 90 mg and reported no side effects.  She is feeling her anxiety and depression is somewhat better.  She does see more hopeful and denies any crying spells or any suicidal thoughts.  She is still struggle with her emotional eating and weight gain.  She is seeing nutritionist and hoping to have weight loss in the future.  She lives with her husband and children.  She had a good support from her husband church and friends.  She is taking medication for her breast cancer and sometimes she feel sad about it but denies any anhedonia.  She likes to be active.  She is not interested in therapy but like to keep the current dose of Cymbalta and trazodone.  She recently had blood work.  Her creatinine is 1.50 which is better than before.   Past Psychiatric History: H/O depression and anxiety. No h/oinpatient,psychosis,mania andsuicidal attempt.TookProzacbutquitworking.Wellbutrin, Rexulti, celexa, Ambien and abilify did not work.Remeronand Amitriptylinecaused weight gain. Mertens not approved.  Recent Results (from the past 2160 hour(s))  Magnesium     Status: None   Collection Time: 08/08/20  9:25 AM  Result Value Ref Range   Magnesium 2.0 1.6 - 2.3 mg/dL   Comprehensive metabolic panel     Status: Abnormal   Collection Time: 08/08/20  9:25 AM  Result Value Ref Range   Glucose 90 65 - 99 mg/dL   BUN 34 (H) 8 - 27 mg/dL   Creatinine, Ser 1.50 (H) 0.57 - 1.00 mg/dL   GFR calc non Af Amer 35 (L) >59 mL/min/1.73   GFR calc Af Amer 41 (L) >59 mL/min/1.73    Comment: **In accordance with recommendations from the NKF-ASN Task force,**   [facility] is in the process of updating its eGFR calculation to the   2021 CKD-EPI creatinine equation that estimates kidney function   without a race variable.    BUN/Creatinine Ratio 23 12 - 28   Sodium 147 (H) 134 - 144 mmol/L   Potassium 5.1 3.5 - 5.2 mmol/L   Chloride 106 96 - 106 mmol/L   CO2 28 20 - 29 mmol/L   Calcium 9.5 8.7 - 10.3 mg/dL   Total Protein 6.4 6.0 - 8.5 g/dL   Albumin 4.1 3.8 - 4.8 g/dL   Globulin, Total 2.3 1.5 - 4.5 g/dL   Albumin/Globulin Ratio 1.8 1.2 - 2.2   Bilirubin Total 0.2 0.0 - 1.2 mg/dL   Alkaline Phosphatase 68 44 - 121 IU/L    Comment:               **Please note reference interval change**   AST 28 0 - 40 IU/L   ALT 42 (H) 0 - 32 IU/L  VITAMIN D 25 Hydroxy (Vit-D Deficiency, Fractures)  Status: None   Collection Time: 08/08/20  9:25 AM  Result Value Ref Range   Vit D, 25-Hydroxy 30.6 30.0 - 100.0 ng/mL    Comment: Vitamin D deficiency has been defined by the [place] practice guideline as a level of serum 25-OH vitamin D less than 20 ng/mL (1,2). The Endocrine Society went on to further define vitamin D insufficiency as a level between 21 and 29 ng/mL (2). 1. IOM (Institute of Medicine). 2010. Dietary reference    intakes for calcium and D. [place]: The    [employer]. 2. Holick MF, [place], Bischoff-Ferrari HA, et al.    Evaluation, treatment, and prevention of vitamin D    deficiency: an Endocrine Society clinical practice    guideline. JCEM. 2011 Jul; 96(7):1911-30.   Brain natriuretic peptide      Status: Abnormal   Collection Time: 08/08/20  9:25 AM  Result Value Ref Range   BNP 150.9 (H) 0.0 - 100.0 pg/mL    Psychiatric Specialty Exam: Physical Exam  Review of Systems  Weight 300 lb (136.1 kg).There is no height or weight on file to calculate BMI.  General Appearance: NA  Eye Contact:  NA  Speech:  Slow  Volume:  Decreased  Mood:  Dysphoric  Affect:  NA  Thought Process:  Goal Directed  Orientation:  Full (Time, Place, and Person)  Thought Content:  Rumination  Suicidal Thoughts:  No  Homicidal Thoughts:  No  Robin:  Immediate;   Fair Recent;   Fair Remote;   Fair  Judgement:  Intact  Insight:  Present  Psychomotor Activity:  NA  Concentration:  Concentration: Fair and Attention Span: Fair  Recall:  Good  Fund of Knowledge:  Good  Language:  Good  Akathisia:  No  Handed:  Right  AIMS (if indicated):     Assets:  Communication Skills Desire for Improvement Housing Resilience Social Support Transportation  ADL's:  Intact  Cognition:  WNL  Sleep:   better     Assessment and Plan: Major depressive disorder, recurrent.  Anxiety.  Patient tolerating higher dose of Cymbalta and reported improvement in her sleep, anxiety and depression.  She denies any side effects.  Continue Cymbalta 90 mg daily and trazodone 100 mg at bedtime.  Recommended to call us back if she has any question or any concern.  She is not interested in therapy as she has a good support from her church, children and husband.  Follow-up in 3 months.  Follow Up Instructions:    I discussed the assessment and treatment plan with the patient. The patient was provided an opportunity to ask questions and all were answered. The patient agreed with the plan and demonstrated an understanding of the instructions.   The patient was advised to call back or seek an in-person evaluation if the symptoms worsen or if the condition fails to improve as anticipated.  I provided 14 minutes of  non-face-to-face time during this encounter.   Kathlee Nations, MD

## 2020-09-13 ENCOUNTER — Ambulatory Visit (INDEPENDENT_AMBULATORY_CARE_PROVIDER_SITE_OTHER): Payer: Medicare HMO | Admitting: Family Medicine

## 2020-09-15 ENCOUNTER — Other Ambulatory Visit: Payer: Self-pay | Admitting: Internal Medicine

## 2020-09-15 DIAGNOSIS — E785 Hyperlipidemia, unspecified: Secondary | ICD-10-CM

## 2020-09-20 DEATH — deceased

## 2020-10-11 ENCOUNTER — Other Ambulatory Visit: Payer: Self-pay | Admitting: Student

## 2020-10-21 NOTE — Telephone Encounter (Signed)
Unfortunately, patient has expired. No further assistance needed.  Charlann Boxer, CPhT

## 2020-12-01 ENCOUNTER — Telehealth (HOSPITAL_COMMUNITY): Payer: Medicare HMO | Admitting: Psychiatry

## 2020-12-12 ENCOUNTER — Ambulatory Visit: Payer: Medicare HMO | Admitting: Hematology and Oncology
